# Patient Record
Sex: Female | Born: 1945 | Race: White | Hispanic: No | Marital: Married | Smoking: Former smoker
Health system: Southern US, Community
[De-identification: ages and names within clinical notes are randomized; demographics above are authoritative.]

## PROBLEM LIST (undated history)

## (undated) DIAGNOSIS — F419 Anxiety disorder, unspecified: Secondary | ICD-10-CM

## (undated) DIAGNOSIS — C8590 Non-Hodgkin lymphoma, unspecified, unspecified site: Secondary | ICD-10-CM

## (undated) DIAGNOSIS — M8580 Other specified disorders of bone density and structure, unspecified site: Secondary | ICD-10-CM

## (undated) DIAGNOSIS — G4730 Sleep apnea, unspecified: Secondary | ICD-10-CM

## (undated) DIAGNOSIS — I10 Essential (primary) hypertension: Secondary | ICD-10-CM

## (undated) DIAGNOSIS — R911 Solitary pulmonary nodule: Secondary | ICD-10-CM

## (undated) DIAGNOSIS — E785 Hyperlipidemia, unspecified: Secondary | ICD-10-CM

## (undated) DIAGNOSIS — K219 Gastro-esophageal reflux disease without esophagitis: Secondary | ICD-10-CM

## (undated) DIAGNOSIS — N6019 Diffuse cystic mastopathy of unspecified breast: Secondary | ICD-10-CM

## (undated) DIAGNOSIS — H409 Unspecified glaucoma: Secondary | ICD-10-CM

## (undated) DIAGNOSIS — R51 Headache: Secondary | ICD-10-CM

## (undated) DIAGNOSIS — K5730 Diverticulosis of large intestine without perforation or abscess without bleeding: Secondary | ICD-10-CM

## (undated) DIAGNOSIS — Z9289 Personal history of other medical treatment: Secondary | ICD-10-CM

## (undated) DIAGNOSIS — K635 Polyp of colon: Secondary | ICD-10-CM

## (undated) DIAGNOSIS — I059 Rheumatic mitral valve disease, unspecified: Secondary | ICD-10-CM

## (undated) DIAGNOSIS — I8390 Asymptomatic varicose veins of unspecified lower extremity: Secondary | ICD-10-CM

## (undated) DIAGNOSIS — J309 Allergic rhinitis, unspecified: Secondary | ICD-10-CM

## (undated) DIAGNOSIS — I480 Paroxysmal atrial fibrillation: Secondary | ICD-10-CM

## (undated) DIAGNOSIS — G4733 Obstructive sleep apnea (adult) (pediatric): Secondary | ICD-10-CM

## (undated) HISTORY — DX: Asymptomatic varicose veins of unspecified lower extremity: I83.90

## (undated) HISTORY — DX: Anxiety disorder, unspecified: F41.9

## (undated) HISTORY — DX: Hyperlipidemia, unspecified: E78.5

## (undated) HISTORY — DX: Polyp of colon: K63.5

## (undated) HISTORY — DX: Allergic rhinitis, unspecified: J30.9

## (undated) HISTORY — DX: Non-Hodgkin lymphoma, unspecified, unspecified site: C85.90

## (undated) HISTORY — DX: Unspecified glaucoma: H40.9

## (undated) HISTORY — DX: Diverticulosis of large intestine without perforation or abscess without bleeding: K57.30

## (undated) HISTORY — DX: Paroxysmal atrial fibrillation: I48.0

## (undated) HISTORY — DX: Rheumatic mitral valve disease, unspecified: I05.9

## (undated) HISTORY — DX: Diffuse cystic mastopathy of unspecified breast: N60.19

## (undated) HISTORY — DX: Solitary pulmonary nodule: R91.1

## (undated) HISTORY — DX: Personal history of other medical treatment: Z92.89

## (undated) HISTORY — DX: Headache: R51

## (undated) HISTORY — DX: Other specified disorders of bone density and structure, unspecified site: M85.80

## (undated) HISTORY — PX: OTHER SURGICAL HISTORY: SHX169

## (undated) HISTORY — DX: Gastro-esophageal reflux disease without esophagitis: K21.9

## (undated) HISTORY — PX: BASAL CELL CARCINOMA EXCISION: SHX1214

## (undated) HISTORY — DX: Obstructive sleep apnea (adult) (pediatric): G47.33

## (undated) HISTORY — DX: Sleep apnea, unspecified: G47.30

## (undated) HISTORY — DX: Essential (primary) hypertension: I10

---

## 1994-08-27 HISTORY — PX: ABDOMINAL HYSTERECTOMY: SHX81

## 1997-11-15 ENCOUNTER — Ambulatory Visit (HOSPITAL_COMMUNITY): Admission: RE | Admit: 1997-11-15 | Discharge: 1997-11-15 | Payer: Self-pay | Admitting: Surgery

## 1998-05-30 ENCOUNTER — Ambulatory Visit (HOSPITAL_COMMUNITY): Admission: RE | Admit: 1998-05-30 | Discharge: 1998-05-30 | Payer: Self-pay | Admitting: Surgery

## 1999-06-22 ENCOUNTER — Ambulatory Visit (HOSPITAL_COMMUNITY): Admission: RE | Admit: 1999-06-22 | Discharge: 1999-06-22 | Payer: Self-pay | Admitting: Radiology

## 2000-01-18 ENCOUNTER — Encounter: Admission: RE | Admit: 2000-01-18 | Discharge: 2000-01-18 | Payer: Self-pay | Admitting: *Deleted

## 2000-07-02 ENCOUNTER — Encounter: Payer: Self-pay | Admitting: Surgery

## 2000-07-02 ENCOUNTER — Encounter: Admission: RE | Admit: 2000-07-02 | Discharge: 2000-07-02 | Payer: Self-pay | Admitting: Surgery

## 2000-09-03 ENCOUNTER — Other Ambulatory Visit: Admission: RE | Admit: 2000-09-03 | Discharge: 2000-09-03 | Payer: Self-pay | Admitting: *Deleted

## 2000-11-28 ENCOUNTER — Ambulatory Visit (HOSPITAL_COMMUNITY): Admission: RE | Admit: 2000-11-28 | Discharge: 2000-11-28 | Payer: Self-pay | Admitting: Gastroenterology

## 2000-11-28 ENCOUNTER — Encounter: Payer: Self-pay | Admitting: Gastroenterology

## 2000-12-18 ENCOUNTER — Encounter: Admission: RE | Admit: 2000-12-18 | Discharge: 2001-03-18 | Payer: Self-pay | Admitting: Pulmonary Disease

## 2001-01-09 ENCOUNTER — Encounter: Admission: RE | Admit: 2001-01-09 | Discharge: 2001-01-09 | Payer: Self-pay | Admitting: Surgery

## 2001-01-09 ENCOUNTER — Encounter: Payer: Self-pay | Admitting: Surgery

## 2001-01-29 ENCOUNTER — Ambulatory Visit (HOSPITAL_COMMUNITY): Admission: RE | Admit: 2001-01-29 | Discharge: 2001-01-29 | Payer: Self-pay | Admitting: Gastroenterology

## 2001-01-29 ENCOUNTER — Encounter: Payer: Self-pay | Admitting: Gastroenterology

## 2001-07-10 ENCOUNTER — Encounter: Admission: RE | Admit: 2001-07-10 | Discharge: 2001-07-10 | Payer: Self-pay | Admitting: Surgery

## 2001-07-10 ENCOUNTER — Encounter: Payer: Self-pay | Admitting: Surgery

## 2002-01-14 ENCOUNTER — Encounter: Admission: RE | Admit: 2002-01-14 | Discharge: 2002-01-14 | Payer: Self-pay | Admitting: Family Medicine

## 2002-02-11 ENCOUNTER — Encounter: Payer: Self-pay | Admitting: Surgery

## 2002-02-11 ENCOUNTER — Encounter: Admission: RE | Admit: 2002-02-11 | Discharge: 2002-02-11 | Payer: Self-pay | Admitting: Surgery

## 2002-06-22 ENCOUNTER — Ambulatory Visit (HOSPITAL_COMMUNITY): Admission: RE | Admit: 2002-06-22 | Discharge: 2002-06-22 | Payer: Self-pay | Admitting: Pulmonary Disease

## 2002-06-22 ENCOUNTER — Encounter: Payer: Self-pay | Admitting: Pulmonary Disease

## 2002-08-19 ENCOUNTER — Encounter: Admission: RE | Admit: 2002-08-19 | Discharge: 2002-08-19 | Payer: Self-pay | Admitting: *Deleted

## 2002-09-24 ENCOUNTER — Other Ambulatory Visit: Admission: RE | Admit: 2002-09-24 | Discharge: 2002-09-24 | Payer: Self-pay | Admitting: *Deleted

## 2002-09-25 ENCOUNTER — Encounter: Admission: RE | Admit: 2002-09-25 | Discharge: 2002-09-25 | Payer: Self-pay | Admitting: Thoracic Surgery

## 2002-09-25 ENCOUNTER — Encounter: Payer: Self-pay | Admitting: Thoracic Surgery

## 2003-01-22 ENCOUNTER — Encounter: Admission: RE | Admit: 2003-01-22 | Discharge: 2003-01-22 | Payer: Self-pay | Admitting: Thoracic Surgery

## 2003-01-22 ENCOUNTER — Encounter: Payer: Self-pay | Admitting: Thoracic Surgery

## 2003-02-16 ENCOUNTER — Encounter: Admission: RE | Admit: 2003-02-16 | Discharge: 2003-02-16 | Payer: Self-pay | Admitting: Surgery

## 2003-02-16 ENCOUNTER — Encounter: Payer: Self-pay | Admitting: Surgery

## 2003-07-27 ENCOUNTER — Encounter: Admission: RE | Admit: 2003-07-27 | Discharge: 2003-07-27 | Payer: Self-pay | Admitting: Thoracic Surgery

## 2003-09-08 ENCOUNTER — Encounter: Admission: RE | Admit: 2003-09-08 | Discharge: 2003-09-08 | Payer: Self-pay | Admitting: *Deleted

## 2003-09-28 ENCOUNTER — Other Ambulatory Visit: Admission: RE | Admit: 2003-09-28 | Discharge: 2003-09-28 | Payer: Self-pay | Admitting: *Deleted

## 2004-01-18 ENCOUNTER — Encounter: Admission: RE | Admit: 2004-01-18 | Discharge: 2004-01-18 | Payer: Self-pay | Admitting: Thoracic Surgery

## 2004-02-24 ENCOUNTER — Encounter: Admission: RE | Admit: 2004-02-24 | Discharge: 2004-02-24 | Payer: Self-pay | Admitting: Surgery

## 2004-07-26 ENCOUNTER — Encounter: Admission: RE | Admit: 2004-07-26 | Discharge: 2004-07-26 | Payer: Self-pay | Admitting: Thoracic Surgery

## 2004-09-12 ENCOUNTER — Encounter: Admission: RE | Admit: 2004-09-12 | Discharge: 2004-09-12 | Payer: Self-pay | Admitting: Surgery

## 2005-03-20 ENCOUNTER — Encounter: Admission: RE | Admit: 2005-03-20 | Discharge: 2005-03-20 | Payer: Self-pay | Admitting: *Deleted

## 2005-04-02 ENCOUNTER — Ambulatory Visit: Payer: Self-pay | Admitting: Pulmonary Disease

## 2005-04-24 ENCOUNTER — Encounter: Admission: RE | Admit: 2005-04-24 | Discharge: 2005-04-24 | Payer: Self-pay | Admitting: Surgery

## 2005-06-05 ENCOUNTER — Ambulatory Visit (HOSPITAL_COMMUNITY): Admission: RE | Admit: 2005-06-05 | Discharge: 2005-06-05 | Payer: Self-pay | Admitting: Pulmonary Disease

## 2005-07-05 ENCOUNTER — Ambulatory Visit: Payer: Self-pay | Admitting: Pulmonary Disease

## 2005-07-17 ENCOUNTER — Ambulatory Visit: Payer: Self-pay | Admitting: Pulmonary Disease

## 2005-09-18 ENCOUNTER — Ambulatory Visit: Payer: Self-pay | Admitting: Pulmonary Disease

## 2005-10-29 ENCOUNTER — Encounter: Admission: RE | Admit: 2005-10-29 | Discharge: 2005-10-29 | Payer: Self-pay | Admitting: Specialist

## 2005-12-25 ENCOUNTER — Ambulatory Visit: Payer: Self-pay | Admitting: Pulmonary Disease

## 2006-04-11 ENCOUNTER — Encounter: Admission: RE | Admit: 2006-04-11 | Discharge: 2006-04-11 | Payer: Self-pay | Admitting: Surgery

## 2006-07-09 ENCOUNTER — Ambulatory Visit: Payer: Self-pay | Admitting: Pulmonary Disease

## 2007-02-05 ENCOUNTER — Ambulatory Visit: Payer: Self-pay | Admitting: Pulmonary Disease

## 2007-02-25 HISTORY — PX: OTHER SURGICAL HISTORY: SHX169

## 2007-03-18 ENCOUNTER — Ambulatory Visit: Payer: Self-pay | Admitting: Pulmonary Disease

## 2007-03-27 ENCOUNTER — Ambulatory Visit (HOSPITAL_BASED_OUTPATIENT_CLINIC_OR_DEPARTMENT_OTHER): Admission: RE | Admit: 2007-03-27 | Discharge: 2007-03-27 | Payer: Self-pay | Admitting: Orthopedic Surgery

## 2007-04-14 ENCOUNTER — Encounter: Admission: RE | Admit: 2007-04-14 | Discharge: 2007-04-14 | Payer: Self-pay | Admitting: Surgery

## 2007-06-03 ENCOUNTER — Encounter: Payer: Self-pay | Admitting: Pulmonary Disease

## 2007-07-31 DIAGNOSIS — K649 Unspecified hemorrhoids: Secondary | ICD-10-CM | POA: Insufficient documentation

## 2007-07-31 DIAGNOSIS — I059 Rheumatic mitral valve disease, unspecified: Secondary | ICD-10-CM | POA: Insufficient documentation

## 2007-07-31 DIAGNOSIS — R519 Headache, unspecified: Secondary | ICD-10-CM | POA: Insufficient documentation

## 2007-07-31 DIAGNOSIS — I10 Essential (primary) hypertension: Secondary | ICD-10-CM | POA: Insufficient documentation

## 2007-07-31 DIAGNOSIS — R51 Headache: Secondary | ICD-10-CM | POA: Insufficient documentation

## 2007-07-31 DIAGNOSIS — K219 Gastro-esophageal reflux disease without esophagitis: Secondary | ICD-10-CM | POA: Insufficient documentation

## 2007-07-31 DIAGNOSIS — E7800 Pure hypercholesterolemia, unspecified: Secondary | ICD-10-CM | POA: Insufficient documentation

## 2007-08-01 ENCOUNTER — Ambulatory Visit: Payer: Self-pay | Admitting: Pulmonary Disease

## 2007-08-01 LAB — CONVERTED CEMR LAB
ALT: 24 units/L (ref 0–35)
AST: 20 units/L (ref 0–37)
Albumin: 4.3 g/dL (ref 3.5–5.2)
Alkaline Phosphatase: 80 units/L (ref 39–117)
BUN: 11 mg/dL (ref 6–23)
Bacteria, UA: NEGATIVE
Basophils Absolute: 0 10*3/uL (ref 0.0–0.1)
Basophils Relative: 0.8 % (ref 0.0–1.0)
Bilirubin Urine: NEGATIVE
Bilirubin, Direct: 0.1 mg/dL (ref 0.0–0.3)
CO2: 33 meq/L — ABNORMAL HIGH (ref 19–32)
Calcium: 10.2 mg/dL (ref 8.4–10.5)
Chloride: 99 meq/L (ref 96–112)
Cholesterol: 164 mg/dL (ref 0–200)
Creatinine, Ser: 0.7 mg/dL (ref 0.4–1.2)
Crystals: NEGATIVE
Eosinophils Absolute: 0.1 10*3/uL (ref 0.0–0.6)
Eosinophils Relative: 1.2 % (ref 0.0–5.0)
GFR calc Af Amer: 109 mL/min
GFR calc non Af Amer: 90 mL/min
Glucose, Bld: 105 mg/dL — ABNORMAL HIGH (ref 70–99)
HCT: 39.2 % (ref 36.0–46.0)
HDL: 40.1 mg/dL (ref >39.0)
Hemoglobin, Urine: NEGATIVE
Hemoglobin: 13.9 g/dL (ref 12.0–15.0)
Ketones, ur: NEGATIVE mg/dL
LDL Cholesterol: 110 mg/dL — ABNORMAL HIGH (ref 0–99)
Lymphocytes Relative: 26.9 % (ref 12.0–46.0)
MCHC: 35.4 g/dL (ref 30.0–36.0)
MCV: 92.6 fL (ref 78.0–100.0)
Monocytes Absolute: 0.4 10*3/uL (ref 0.2–0.7)
Monocytes Relative: 8.4 % (ref 3.0–11.0)
Mucus, UA: NEGATIVE
Neutro Abs: 3.3 10*3/uL (ref 1.4–7.7)
Neutrophils Relative %: 62.7 % (ref 43.0–77.0)
Nitrite: NEGATIVE
Platelets: 264 10*3/uL (ref 150–400)
Potassium: 3.6 meq/L (ref 3.5–5.1)
RBC / HPF: NONE SEEN
RBC: 4.24 M/uL (ref 3.87–5.11)
RDW: 11.9 % (ref 11.5–14.6)
Sodium: 140 meq/L (ref 135–145)
Specific Gravity, Urine: 1.025 (ref 1.000–1.03)
Squamous Epithelial / LPF: NEGATIVE /lpf
TSH: 1.66 microintl units/mL (ref 0.35–5.50)
Total Bilirubin: 1 mg/dL (ref 0.3–1.2)
Total CHOL/HDL Ratio: 4.1
Total Protein, Urine: NEGATIVE mg/dL
Total Protein: 7.4 g/dL (ref 6.0–8.3)
Triglycerides: 70 mg/dL (ref 0–149)
Urine Glucose: NEGATIVE mg/dL
Urobilinogen, UA: 0.2 (ref 0.0–1.0)
VLDL: 14 mg/dL (ref 0–40)
WBC: 5.2 10*3/uL (ref 4.5–10.5)
pH: 6 (ref 5.0–8.0)

## 2007-08-12 ENCOUNTER — Ambulatory Visit: Payer: Self-pay | Admitting: Pulmonary Disease

## 2007-08-12 LAB — CONVERTED CEMR LAB
Fecal Occult Blood: NEGATIVE
OCCULT 1: NEGATIVE
OCCULT 2: NEGATIVE
OCCULT 3: NEGATIVE
OCCULT 4: NEGATIVE
OCCULT 5: NEGATIVE

## 2007-08-18 ENCOUNTER — Telehealth: Payer: Self-pay | Admitting: Pulmonary Disease

## 2007-08-26 ENCOUNTER — Telehealth: Payer: Self-pay | Admitting: Adult Health

## 2007-08-28 HISTORY — PX: EYE SURGERY: SHX253

## 2007-09-30 ENCOUNTER — Encounter: Payer: Self-pay | Admitting: Pulmonary Disease

## 2007-12-17 ENCOUNTER — Ambulatory Visit: Payer: Self-pay | Admitting: Pulmonary Disease

## 2007-12-17 DIAGNOSIS — J309 Allergic rhinitis, unspecified: Secondary | ICD-10-CM | POA: Insufficient documentation

## 2007-12-17 DIAGNOSIS — R079 Chest pain, unspecified: Secondary | ICD-10-CM | POA: Insufficient documentation

## 2007-12-17 DIAGNOSIS — J984 Other disorders of lung: Secondary | ICD-10-CM | POA: Insufficient documentation

## 2007-12-17 DIAGNOSIS — N6019 Diffuse cystic mastopathy of unspecified breast: Secondary | ICD-10-CM | POA: Insufficient documentation

## 2007-12-17 DIAGNOSIS — M899 Disorder of bone, unspecified: Secondary | ICD-10-CM | POA: Insufficient documentation

## 2007-12-17 DIAGNOSIS — M810 Age-related osteoporosis without current pathological fracture: Secondary | ICD-10-CM | POA: Insufficient documentation

## 2007-12-17 DIAGNOSIS — L659 Nonscarring hair loss, unspecified: Secondary | ICD-10-CM | POA: Insufficient documentation

## 2007-12-17 DIAGNOSIS — K5730 Diverticulosis of large intestine without perforation or abscess without bleeding: Secondary | ICD-10-CM | POA: Insufficient documentation

## 2007-12-22 ENCOUNTER — Ambulatory Visit: Payer: Self-pay | Admitting: Cardiology

## 2008-02-04 ENCOUNTER — Encounter: Payer: Self-pay | Admitting: Pulmonary Disease

## 2008-03-28 ENCOUNTER — Encounter: Payer: Self-pay | Admitting: Pulmonary Disease

## 2008-04-27 ENCOUNTER — Encounter: Admission: RE | Admit: 2008-04-27 | Discharge: 2008-04-27 | Payer: Self-pay | Admitting: Surgery

## 2008-05-27 ENCOUNTER — Encounter: Payer: Self-pay | Admitting: Pulmonary Disease

## 2008-07-26 ENCOUNTER — Encounter: Payer: Self-pay | Admitting: Pulmonary Disease

## 2008-08-04 ENCOUNTER — Ambulatory Visit: Payer: Self-pay | Admitting: Pulmonary Disease

## 2008-08-04 DIAGNOSIS — H409 Unspecified glaucoma: Secondary | ICD-10-CM | POA: Insufficient documentation

## 2008-08-04 LAB — CONVERTED CEMR LAB
ALT: 19 units/L (ref 0–35)
AST: 20 units/L (ref 0–37)
Albumin: 4.6 g/dL (ref 3.5–5.2)
Alkaline Phosphatase: 89 units/L (ref 39–117)
BUN: 10 mg/dL (ref 6–23)
Basophils Absolute: 0.1 10*3/uL (ref 0.0–0.1)
Basophils Relative: 0.8 % (ref 0.0–3.0)
Bilirubin Urine: NEGATIVE
Bilirubin, Direct: 0.1 mg/dL (ref 0.0–0.3)
CO2: 33 meq/L — ABNORMAL HIGH (ref 19–32)
Calcium: 9.8 mg/dL (ref 8.4–10.5)
Chloride: 103 meq/L (ref 96–112)
Cholesterol: 170 mg/dL (ref 0–200)
Creatinine, Ser: 0.6 mg/dL (ref 0.4–1.2)
Crystals: NEGATIVE
Eosinophils Absolute: 0.1 10*3/uL (ref 0.0–0.7)
Eosinophils Relative: 1.1 % (ref 0.0–5.0)
GFR calc Af Amer: 130 mL/min
GFR calc non Af Amer: 107 mL/min
Glucose, Bld: 113 mg/dL — ABNORMAL HIGH (ref 70–99)
HCT: 41.7 % (ref 36.0–46.0)
HDL: 37.4 mg/dL — ABNORMAL LOW (ref >39.0)
Hemoglobin: 14.8 g/dL (ref 12.0–15.0)
Ketones, ur: NEGATIVE mg/dL
LDL Cholesterol: 108 mg/dL — ABNORMAL HIGH (ref 0–99)
Leukocytes, UA: NEGATIVE
Lymphocytes Relative: 13.1 % (ref 12.0–46.0)
MCHC: 35.6 g/dL (ref 30.0–36.0)
MCV: 92.3 fL (ref 78.0–100.0)
Monocytes Absolute: 0.5 10*3/uL (ref 0.1–1.0)
Monocytes Relative: 5 % (ref 3.0–12.0)
Mucus, UA: NEGATIVE
Neutro Abs: 7.9 10*3/uL — ABNORMAL HIGH (ref 1.4–7.7)
Neutrophils Relative %: 80 % — ABNORMAL HIGH (ref 43.0–77.0)
Nitrite: NEGATIVE
Platelets: 256 10*3/uL (ref 150–400)
Potassium: 3.6 meq/L (ref 3.5–5.1)
RBC: 4.51 M/uL (ref 3.87–5.11)
RDW: 11.7 % (ref 11.5–14.6)
Sodium: 142 meq/L (ref 135–145)
Specific Gravity, Urine: <=1.005 (ref 1.000–1.03)
TSH: 1.57 microintl units/mL (ref 0.35–5.50)
Total Bilirubin: 1 mg/dL (ref 0.3–1.2)
Total CHOL/HDL Ratio: 4.5
Total Protein, Urine: NEGATIVE mg/dL
Total Protein: 7.9 g/dL (ref 6.0–8.3)
Triglycerides: 124 mg/dL (ref 0–149)
Urine Glucose: NEGATIVE mg/dL
Urobilinogen, UA: 0.2 (ref 0.0–1.0)
VLDL: 25 mg/dL (ref 0–40)
WBC: 9.9 10*3/uL (ref 4.5–10.5)
pH: 6 (ref 5.0–8.0)

## 2008-08-05 ENCOUNTER — Telehealth (INDEPENDENT_AMBULATORY_CARE_PROVIDER_SITE_OTHER): Payer: Self-pay | Admitting: *Deleted

## 2008-08-09 ENCOUNTER — Telehealth (INDEPENDENT_AMBULATORY_CARE_PROVIDER_SITE_OTHER): Payer: Self-pay | Admitting: *Deleted

## 2008-08-11 ENCOUNTER — Ambulatory Visit: Payer: Self-pay | Admitting: Thoracic Surgery

## 2008-08-18 ENCOUNTER — Ambulatory Visit: Payer: Self-pay | Admitting: Thoracic Surgery

## 2008-08-18 ENCOUNTER — Encounter: Admission: RE | Admit: 2008-08-18 | Discharge: 2008-08-18 | Payer: Self-pay | Admitting: Thoracic Surgery

## 2008-08-19 ENCOUNTER — Ambulatory Visit: Payer: Self-pay | Admitting: Pulmonary Disease

## 2008-08-19 LAB — CONVERTED CEMR LAB
Fecal Occult Blood: NEGATIVE
OCCULT 1: NEGATIVE
OCCULT 2: NEGATIVE
OCCULT 3: NEGATIVE
OCCULT 4: NEGATIVE
OCCULT 5: NEGATIVE

## 2009-04-28 ENCOUNTER — Encounter: Admission: RE | Admit: 2009-04-28 | Discharge: 2009-04-28 | Payer: Self-pay | Admitting: Surgery

## 2009-05-05 ENCOUNTER — Encounter: Admission: RE | Admit: 2009-05-05 | Discharge: 2009-05-05 | Payer: Self-pay | Admitting: Surgery

## 2009-05-26 ENCOUNTER — Encounter: Payer: Self-pay | Admitting: Pulmonary Disease

## 2009-06-29 ENCOUNTER — Telehealth: Payer: Self-pay | Admitting: Pulmonary Disease

## 2009-07-07 ENCOUNTER — Encounter: Payer: Self-pay | Admitting: Pulmonary Disease

## 2009-08-09 ENCOUNTER — Ambulatory Visit: Payer: Self-pay | Admitting: Pulmonary Disease

## 2009-08-09 LAB — CONVERTED CEMR LAB: Vit D, 25-Hydroxy: 44 ng/mL (ref 30–89)

## 2009-08-11 LAB — CONVERTED CEMR LAB
ALT: 22 units/L (ref 0–35)
AST: 22 units/L (ref 0–37)
Albumin: 4.3 g/dL (ref 3.5–5.2)
Alkaline Phosphatase: 77 units/L (ref 39–117)
BUN: 11 mg/dL (ref 6–23)
Basophils Absolute: 0 10*3/uL (ref 0.0–0.1)
Basophils Relative: 0.7 % (ref 0.0–3.0)
Bilirubin Urine: NEGATIVE
Bilirubin, Direct: 0.1 mg/dL (ref 0.0–0.3)
CO2: 33 meq/L — ABNORMAL HIGH (ref 19–32)
Calcium: 9.8 mg/dL (ref 8.4–10.5)
Chloride: 100 meq/L (ref 96–112)
Cholesterol: 156 mg/dL (ref 0–200)
Creatinine, Ser: 0.7 mg/dL (ref 0.4–1.2)
Eosinophils Absolute: 0.1 10*3/uL (ref 0.0–0.7)
Eosinophils Relative: 1.3 % (ref 0.0–5.0)
GFR calc non Af Amer: 89.32 mL/min (ref >60)
Glucose, Bld: 93 mg/dL (ref 70–99)
HCT: 41.1 % (ref 36.0–46.0)
HDL: 42.3 mg/dL (ref >39.00)
Hemoglobin, Urine: NEGATIVE
Hemoglobin: 14.2 g/dL (ref 12.0–15.0)
Hgb A1c MFr Bld: 5.7 % (ref 4.6–6.5)
Ketones, ur: NEGATIVE mg/dL
LDL Cholesterol: 100 mg/dL — ABNORMAL HIGH (ref 0–99)
Leukocytes, UA: NEGATIVE
Lymphocytes Relative: 26.9 % (ref 12.0–46.0)
Lymphs Abs: 1.3 10*3/uL (ref 0.7–4.0)
MCHC: 34.7 g/dL (ref 30.0–36.0)
MCV: 95.6 fL (ref 78.0–100.0)
Monocytes Absolute: 0.4 10*3/uL (ref 0.1–1.0)
Monocytes Relative: 7.8 % (ref 3.0–12.0)
Neutro Abs: 3.2 10*3/uL (ref 1.4–7.7)
Neutrophils Relative %: 63.3 % (ref 43.0–77.0)
Nitrite: NEGATIVE
Platelets: 223 10*3/uL (ref 150.0–400.0)
Potassium: 3.7 meq/L (ref 3.5–5.1)
RBC: 4.29 M/uL (ref 3.87–5.11)
RDW: 11.6 % (ref 11.5–14.6)
Sodium: 140 meq/L (ref 135–145)
Specific Gravity, Urine: 1.01 (ref 1.000–1.030)
TSH: 2.1 microintl units/mL (ref 0.35–5.50)
Total Bilirubin: 1 mg/dL (ref 0.3–1.2)
Total CHOL/HDL Ratio: 4
Total Protein, Urine: NEGATIVE mg/dL
Total Protein: 7.4 g/dL (ref 6.0–8.3)
Triglycerides: 71 mg/dL (ref 0.0–149.0)
Urine Glucose: NEGATIVE mg/dL
Urobilinogen, UA: 0.2 (ref 0.0–1.0)
VLDL: 14.2 mg/dL (ref 0.0–40.0)
WBC: 5 10*3/uL (ref 4.5–10.5)
pH: 6 (ref 5.0–8.0)

## 2009-09-01 ENCOUNTER — Telehealth: Payer: Self-pay | Admitting: Pulmonary Disease

## 2009-09-23 ENCOUNTER — Encounter: Payer: Self-pay | Admitting: Pulmonary Disease

## 2010-03-14 ENCOUNTER — Encounter: Payer: Self-pay | Admitting: Pulmonary Disease

## 2010-04-28 ENCOUNTER — Encounter: Payer: Self-pay | Admitting: Pulmonary Disease

## 2010-05-08 ENCOUNTER — Encounter: Admission: RE | Admit: 2010-05-08 | Discharge: 2010-05-08 | Payer: Self-pay | Admitting: Surgery

## 2010-05-09 ENCOUNTER — Encounter: Payer: Self-pay | Admitting: Pulmonary Disease

## 2010-07-24 ENCOUNTER — Encounter: Admission: RE | Admit: 2010-07-24 | Discharge: 2010-07-24 | Payer: Self-pay | Admitting: Family Medicine

## 2010-08-07 ENCOUNTER — Encounter: Payer: Self-pay | Admitting: Pulmonary Disease

## 2010-08-11 ENCOUNTER — Ambulatory Visit: Payer: Self-pay | Admitting: Pulmonary Disease

## 2010-08-22 ENCOUNTER — Telehealth: Payer: Self-pay | Admitting: Pulmonary Disease

## 2010-08-24 ENCOUNTER — Telehealth: Payer: Self-pay | Admitting: Pulmonary Disease

## 2010-08-25 ENCOUNTER — Ambulatory Visit: Payer: Self-pay | Admitting: Pulmonary Disease

## 2010-08-27 HISTORY — PX: OTHER SURGICAL HISTORY: SHX169

## 2010-09-06 LAB — HM COLONOSCOPY

## 2010-09-08 ENCOUNTER — Telehealth (INDEPENDENT_AMBULATORY_CARE_PROVIDER_SITE_OTHER): Payer: Self-pay | Admitting: *Deleted

## 2010-09-12 ENCOUNTER — Other Ambulatory Visit: Payer: Self-pay | Admitting: Pulmonary Disease

## 2010-09-12 ENCOUNTER — Ambulatory Visit
Admission: RE | Admit: 2010-09-12 | Discharge: 2010-09-12 | Payer: Self-pay | Source: Home / Self Care | Attending: Pulmonary Disease | Admitting: Pulmonary Disease

## 2010-09-12 LAB — HEMOCCULT SLIDES (X 3 CARDS)
Fecal Occult Blood: NEGATIVE
OCCULT 1: POSITIVE
OCCULT 2: NEGATIVE
OCCULT 3: NEGATIVE
OCCULT 4: NEGATIVE
OCCULT 5: NEGATIVE

## 2010-09-14 ENCOUNTER — Telehealth: Payer: Self-pay | Admitting: Pulmonary Disease

## 2010-09-18 ENCOUNTER — Telehealth: Payer: Self-pay | Admitting: Pulmonary Disease

## 2010-09-19 ENCOUNTER — Encounter: Payer: Self-pay | Admitting: Pulmonary Disease

## 2010-09-26 NOTE — Letter (Signed)
Summary: [facility]  [facility]   Imported By: Sherian Rein 10/07/2009 13:32:24  _____________________________________________________________________  External Attachment:    Type:   Image     Comment:   External Document

## 2010-09-26 NOTE — Consult Note (Signed)
Summary: [place] By: Jerrye Noble D'jimraou 04/07/2008 13:50:20  _____________________________________________________________________  External Attachment:    Type:   Image     Comment:   External Document

## 2010-09-26 NOTE — Procedures (Signed)
Summary: [facility] Endoscopy/[facility]  [facility]oscopy/[facility]   Imported By: Sherian Rein 07/25/2009 08:33:16  _____________________________________________________________________  External Attachment:    Type:   Image     Comment:   External Document

## 2010-09-26 NOTE — Progress Notes (Signed)
Summary: rx  Phone Note Call from Patient Call back at [phone]   Caller: Patient Call For: nadel Reason for Call: Talk to Nurse Summary of Call: coughing up yellow plegm, can you call in zpac to [facility] Initial call taken by: Eugene Gavia,  August 05, 2008 1:15 PM  Follow-up for Phone Call        OK PER SN TO SEND Z-PAK #1 AS DIRECTED Follow-up by: Philipp Deputy CMA,  August 05, 2008 1:26 PM    New/Updated Medications: ZITHROMAX Z-PAK 250 MG TABS (AZITHROMYCIN) 2 TABS THE FIRST DAY THEN 1 TAB DAILY TILL GONE   Prescriptions: ZITHROMAX Z-PAK 250 MG TABS (AZITHROMYCIN) 2 TABS THE FIRST DAY THEN 1 TAB DAILY TILL GONE  #1 x 0   Entered by:   Philipp Deputy CMA   Authorized by:   Michele Mcalpine MD   Signed by:   Philipp Deputy CMA on 08/05/2008   Method used:   Faxed to ...       [facility]* (retail)       [address]       [place], [place]  [id number]       Ph: [phone]       Fax: [phone]   RxID:   [id number]

## 2010-09-26 NOTE — Letter (Signed)
Summary: [facility]   Imported By: Sherian Rein 06/20/2009 10:17:28  _____________________________________________________________________  External Attachment:    Type:   Image     Comment:   External Document

## 2010-09-26 NOTE — Letter (Signed)
Summary: [facility]  [facility]   Imported By: Lester Washtucna 05/05/2010 09:56:35  _____________________________________________________________________  External Attachment:    Type:   Image     Comment:   External Document

## 2010-09-26 NOTE — Letter (Signed)
Summary: [facility]   Imported By: Lennie Odor 05/24/2010 14:24:28  _____________________________________________________________________  External Attachment:    Type:   Image     Comment:   External Document

## 2010-09-26 NOTE — Consult Note (Signed)
Summary: [facility] By: Edmonia James 02/16/2008 14:53:51  _____________________________________________________________________  External Attachment:    Type:   Image     Comment:   External Document

## 2010-09-26 NOTE — Letter (Signed)
Summary: [facility]   Imported By: Sherian Rein 07/25/2009 10:08:50  _____________________________________________________________________  External Attachment:    Type:   Image     Comment:   External Document

## 2010-09-26 NOTE — Procedures (Signed)
Summary: EGD/[facility]  EGD/[facility]   Imported By: Bubba Hales 07/12/2009 09:09:23  _____________________________________________________________________  External Attachment:    Type:   Image     Comment:   External Document

## 2010-09-26 NOTE — Procedures (Signed)
Summary: Colon/[facility]  Colon/[facility]   Imported By: Bubba Hales 07/12/2009 09:10:41  _____________________________________________________________________  External Attachment:    Type:   Image     Comment:   External Document

## 2010-09-26 NOTE — Procedures (Signed)
Summary: Colonoscopy/[facility]  Colonoscopy/[facility]   Imported By: Sherian Rein 07/25/2009 08:34:49  _____________________________________________________________________  External Attachment:    Type:   Image     Comment:   External Document

## 2010-09-26 NOTE — Assessment & Plan Note (Signed)
Summary: cpx/ mbw   Chief Complaint:  Yearly CPX....  History of Present Illness: 65 y/o WF here for a follow up visit and CPX... she has multiple medical problems as noted below...   1)  she had f/u [facility]- DrStreck 10/09 for breast exam w/ +FamHx of breast ca in mother... Mammogram neg, exam neg & f/u planned 37yr. 2)  had f/u [facility] GI dept, DrMorgan in 7/09- hx GERD, divertics w/ stricture, sl constipation- doing well & no changes made. 3)  6/09 saw DrSypher for bilat shoulder pain & DDD in [facility]- he rec exercise therapy. 4)  saw DrWall for Cards 4/09 w/ non-cardiac CP, hx MVP, HBP, Hyperlipid- she had neg Myoview in 2005... he rec same meds.   ~  August 04, 2008:  she saw DrShoemaker last week (note pending) for chr cough and "tickle" in throat... she reports nothing found and she has since developed a sore throat, sl hoarseness, continued cough; but denies f/c/s & no phlegm...  she recalls seeing DrBurney in 2005 for "cough and nodules on my lungs" and she would like a referral to see him again to be sure that everything is OK... she would also like Tussionex for the cough & we will Rx w/ MMW for the throat symptoms.     Current Problems:   GLAUCOMA (ICD-365.9) - on COSOPT drops Bid per Ophthalomology for "ocular hypertension, glaucoma, & drusen"...  ALLERGIC RHINITIS (ICD-477.9) - on CLARITIN 10mg  Prn...  Hx of PULMONARY NODULE (ICD-518.89) - prev CTChest w/ several 4-28mm bilat nodules & no change x yrs... first scan 2003 for chr cough/  followed by DrBurney x yrs/  last CT 11/05 (no change)...  ~  yearly CXR's have been stable since last CT Chest in 11/05.  HYPERTENSION (ICD-401.9) - on MICARDIS 80-12.5 daily, & NORVASC 10mg /d...  BP = 122/80, takes meds regularly & tolerates well... denies HA, fatigue, visual changes, palipit, dizziness, syncope, dyspnea, edema, etc...   CHEST PAIN, ACUTE (ICD-786.50) & MITRAL VALVE PROLAPSE (ICD-424.0) - takes ASA 81mg /d... she has seen DrWall in  2005, and again 4/09 for atypical CP & MVP... he recommended increased exercise program for her...   ~  2DEcho 7/03 showed mild prolapse of the ant MV leaflet...  ~  NuclearStressTest 2/05 was negative- no ischemia, no infarction, EF= 81%...  HYPERCHOLESTEROLEMIA (ICD-272.0) - on LIPITOR 20mg /d,  and FISH OIL 1000mg /d...  ~  FLP 12/08 on Lip20 showed TChol 164, TG 70, HDL 40, LDL 110...  ~  FLP 12/09 =   GERD (ICD-530.81) - on NEXIUM 40mg /d...  ~   last EGD 11/05 by [facility] showed sm HH, min gastritis (no esophagitis and no Barrett's)...  DIVERTICULOSIS OF COLON (ICD-562.10) & Hx of HEMORRHOIDS (ICD-455.6) - last colonoscopy 4/05 by Sunday Corn showed divertics, fixed tortuous segm in sigmoid...   FIBROCYSTIC BREAST DISEASE (ICD-610.1) - followed by DrStreck due to +FamHx of breast cancer in mother...  OSTEOPENIA (ICD-733.90) - on Calcium, Vitamins, Vit D... BMD's at [facility] have been WNL...  HEADACHE (ICD-784.0) - hx headache eval 1996 by Wende Bushy al...  ~  she had a neg eval by DrWillis in 1999 for numbness in fingers and toes...  ALOPECIA (ICD-704.00) - she had an evaluation from Dr. Aurea Graff at [facility] in [place] in 2002... he recommended Black Currant and Evening Primrose Oil  for her hair health, along w/ Horse Chestnut for varicose veins and a DASH diet... she also did yoga, accupuncture, massage therapy, and "  body talk" therapy... Dermatologists at [facility] recommended Rogaine therapy...  Health Maintenance -  GYN= DrDickstein & pt states up-to-date... she saw DrKohut in 2008 for thyroid panerl which was WNL...      Current Allergies (reviewed today): No known allergies   Past Medical History:        GLAUCOMA (ICD-365.9)    ALLERGIC RHINITIS (ICD-477.9)    Hx of PULMONARY NODULE (ICD-518.89)    HYPERTENSION (ICD-401.9)    CHEST PAIN, ACUTE (ICD-786.50)    MITRAL VALVE PROLAPSE (ICD-424.0)    HYPERCHOLESTEROLEMIA (ICD-272.0)     GERD (ICD-530.81)    DIVERTICULOSIS OF COLON (ICD-562.10)    HEMORRHOIDS (ICD-455.6)    FIBROCYSTIC BREAST DISEASE (ICD-610.1)    OSTEOPENIA (ICD-733.90)    HEADACHE (ICD-784.0)    ALOPECIA (ICD-704.00)      Past Surgical History:    S/P TAH & BSO    S/P ORIF prox phalanx of right little finger 7/08 by DrKuzma    Risk Factors:  Tobacco use:  never   Review of Systems       The patient complains of sore throat, hoarseness, chest pain, cough, constipation, gas/bloating, and anxiety.  The patient denies fever, chills, sweats, anorexia, fatigue, weakness, malaise, weight loss, sleep disorder, blurring, diplopia, eye irritation, eye discharge, vision loss, eye pain, photophobia, earache, ear discharge, tinnitus, decreased hearing, nasal congestion, nosebleeds, palpitations, syncope, dyspnea on exertion, orthopnea, PND, peripheral edema, dyspnea at rest, excessive sputum, hemoptysis, wheezing, pleurisy, nausea, vomiting, diarrhea, change in bowel habits, abdominal pain, melena, hematochezia, jaundice, indigestion/heartburn, dysphagia, odynophagia, dysuria, hematuria, urinary frequency, urinary hesitancy, nocturia, incontinence, back pain, joint pain, joint swelling, muscle cramps, muscle weakness, stiffness, arthritis, sciatica, restless legs, leg pain at night, leg pain with exertion, rash, itching, dryness, suspicious lesions, paralysis, paresthesias, seizures, tremors, vertigo, transient blindness, frequent falls, frequent headaches, difficulty walking, depression, memory loss, confusion, cold intolerance, heat intolerance, polydipsia, polyphagia, polyuria, unusual weight change, abnormal bruising, bleeding, enlarged lymph nodes, urticaria, allergic rash, hay fever, and recurrent infections.     Vital Signs:  Patient Profile:   65 Years Old Female Weight:      170.25 pounds O2 Sat:      96 % O2 treatment:    Room Air Temp:     98.9 degrees F oral Pulse rate:   90 / minute BP sitting:    122 / 80  (right arm) Cuff size:   regular  Vitals Entered By: Marijo File CMA (August 04, 2008 9:05 AM)                 Physical Exam  WD, WN, 65 y/o WF in NAD... GENERAL:  Alert & oriented; pleasant & cooperative... HEENT:  [place]/AT, EOM-wnl, PERRLA, EACs-clear, TMs-wnl, NOSE-clear, THROAT-clear & wnl. NECK:  Supple w/ full ROM; no JVD; normal carotid impulses w/o bruits; no thyromegaly or nodules palpated; no lymphadenopathy. CHEST:  Clear to P & A; without wheezes/ rales/ or rhonchi. HEART:  Regular Rhythm; without murmurs/ rubs/ or gallops. ABDOMEN:  Soft & nontender; normal bowel sounds; no organomegaly or masses detected. EXT: without deformities, mild arthritic changes; no varicose veins/ +venous insuffic/ no edema. NEURO:  CN's intact; motor testing normal; sensory testing normal; gait normal & balance OK. DERM:  No lesions noted; no rash, mild alopecia...        Impression & Recommendations:  Problem # 1:  PHYSICAL EXAMINATION (ICD-V70.0) Good general health... we will f/u CXR/ EKG/ Labs and refer to DrBurney per request...  Orders: 12 Lead  EKG (12 Lead EKG) T-2 View CXR, Same Day (71020.5TC) Venipuncture (32440) TLB-Lipid Panel (80061-LIPID) TLB-BMP (Basic Metabolic Panel-BMET) (80048-METABOL) TLB-CBC Platelet - w/Differential (85025-CBCD) TLB-Hepatic/Liver Function Pnl (80076-HEPATIC) TLB-TSH (Thyroid Stimulating Hormone) (84443-TSH) TLB-Udip w/ Micro (81001-URINE)   Problem # 2:  Hx of PULMONARY NODULE (ICD-518.89) Hx benign, likely old inflamm pulm nodule- last seen on CT 11/05 - tiny  ~51mm pleural based in right post mid chest... subseq CXR's without developing nodule seen... pt is reassured. Orders: T-2 View CXR, Same Day (71020.5TC) Surgical Referral (Surgery)   Problem # 3:  HYPERTENSION (ICD-401.9) Controlled-  same meds. Her updated medication list for this problem includes:    Norvasc 10 Mg Tabs (Amlodipine besylate) .[name]... Take 1 tablet  by mouth once a day    Micardis Hct 80-12.5 Mg Tabs (Telmisartan-hctz) .[name]... Take 1 tablet by mouth once a day   Problem # 4:  MITRAL VALVE PROLAPSE (ICD-424.0) Stable- w/ min CP, no signif palpit etc... Her updated medication list for this problem includes:    Adult Aspirin Ec Low Strength 81 Mg Tbec (Aspirin) .[name]... Take 1 tablet by mouth once a day   Problem # 5:  HYPERCHOLESTEROLEMIA (ICD-272.0) Due for f/u FLP... continue med. Her updated medication list for this problem includes:    Lipitor 20 Mg Tabs (Atorvastatin calcium) .[name]... Take 1 tablet by mouth once a day   Problem # 6:  DIVERTICULOSIS OF COLON (ICD-562.10) GI problems stable and [facility]...  Problem # 7:  FIBROCYSTIC BREAST DISEASE (ICD-610.1) Stable w/ neg mammogram and followed by DrStreck yearly...  Complete Medication List: 1)  Cosopt 2-0.5 % Soln (Dorzolamide-timolol) .... Use two times a day in both eyes 2)  Claritin 10 Mg Tabs (Loratadine) .... As needed 3)  Adult Aspirin Ec Low Strength 81 Mg Tbec (Aspirin) .... Take 1 tablet by mouth once a day 4)  Norvasc 10 Mg Tabs (Amlodipine besylate) .... Take 1 tablet by mouth once a day 5)  Micardis Hct 80-12.5 Mg Tabs (Telmisartan-hctz) .... Take 1 tablet by mouth once a day 6)  Lipitor 20 Mg Tabs (Atorvastatin calcium) .... Take 1 tablet by mouth once a day 7)  Fish Oil 1000 Mg Caps (Omega-3 fatty acids) .... Take 1 tablet by mouth once a day 8)  Nexium 40 Mg Cpdr (Esomeprazole magnesium) .... Take 1 tablet by mouth once a day (30 min prior to evening meal) 9)  Citracal Plus Tabs (Multiple minerals-vitamins) .... Take 1 tablet by mouth once a day 10)  Centrum Tabs (Multiple vitamins-minerals) .... Take 1 tablet by mouth once a day 11)  Vitamin D 1000 Unit Tabs (Cholecalciferol) .... Take 1 tablet by mouth once a day 12)  Tussionex Pennkinetic Er 8-10 Mg/56ml Lqcr (Chlorpheniramine-hydrocodone) .... Take 1 tsp by mouth two times a day as needed for  cough... 13)  Magic Mouthwash  .... 1 tsp gargle and swallow 4 times per day as needed for throat symptoms...   Patient Instructions: 1)  Today we updated your med list- see below.... 2)  We refilled your meds per request... 3)  We also wrote new perscriptions for Tussionex and MMW for your cough and throat symptoms.Marland KitchenMarland Kitchen 4)  We did your follow up CXR and blood work today- please call the "phone tree" in a few days for your lab results.Marland KitchenMarland Kitchen  5)  Please check the "stool cards" at your convenience and mail them back to [place]... 6)  We will arrange for a follow up appt w/ DrBurney  as you requested... (we will send him a copy of this note and CXR report)... 7)  Call for any problems... 8)  Please schedule a follow-up appointment in 1 year, sooner as needed.   Prescriptions: MAGIC MOUTHWASH 1 tsp gargle and swallow 4 times per day as needed for throat symptoms...  #4 oz x prn   Entered and Authorized by:   Michele Mcalpine MD   Signed by:   Michele Mcalpine MD on 08/04/2008   Method used:   Print then Give to Patient   RxID:   [id number] TUSSIONEX PENNKINETIC ER 8-10 MG/5ML LQCR (CHLORPHENIRAMINE-HYDROCODONE) take 1 tsp by mouth two times a day as needed for cough...  #4 oz x 6   Entered and Authorized by:   Michele Mcalpine MD   Signed by:   Michele Mcalpine MD on 08/04/2008   Method used:   Print then Give to Patient   RxID:   [id number] NEXIUM 40 MG CPDR (ESOMEPRAZOLE MAGNESIUM) Take 1 tablet by mouth once a day (30 min prior to evening meal)  #30 x prn   Entered and Authorized by:   Michele Mcalpine MD   Signed by:   Michele Mcalpine MD on 08/04/2008   Method used:   Print then Give to Patient   RxID:   [id number] LIPITOR 20 MG  TABS (ATORVASTATIN CALCIUM) Take 1 tablet by mouth once a day  #30 x prn   Entered and Authorized by:   Michele Mcalpine MD   Signed by:   Michele Mcalpine MD on 08/04/2008   Method used:   Print then Give to Patient   RxID:   [id number] MICARDIS HCT 80-12.5 MG   TABS (TELMISARTAN-HCTZ) Take 1 tablet by mouth once a day  #30 x prn   Entered and Authorized by:   Michele Mcalpine MD   Signed by:   Michele Mcalpine MD on 08/04/2008   Method used:   Print then Give to Patient   RxID:   [id number] NORVASC 10 MG  TABS (AMLODIPINE BESYLATE) Take 1 tablet by mouth once a day  #30 x prn   Entered and Authorized by:   Michele Mcalpine MD   Signed by:   Michele Mcalpine MD on 08/04/2008   Method used:   Print then Give to Patient   RxID:   [id number]  ]

## 2010-09-26 NOTE — Consult Note (Signed)
Summary: [facility]   Imported By: Jerrye Noble D'jimraou 06/16/2008 15:18:48  _____________________________________________________________________  External Attachment:    Type:   Image     Comment:   External Document

## 2010-09-26 NOTE — Consult Note (Signed)
[facility]   Imported By: Esmeralda Links D'jimraou 10/28/2007 13:34:45  _____________________________________________________________________  External Attachment:    Type:   Image     Comment:   External Document

## 2010-09-26 NOTE — Progress Notes (Signed)
Summary: rx  Phone Note Call from Patient   Caller: Patient Call For: nadel Reason for Call: Talk to Nurse Summary of Call: runny nose, drainage, wants allegra if possible. [facility] Initial call taken by: Eugene Gavia,  August 09, 2008 8:52 AM  Follow-up for Phone Call        per sn ok to call in allegra 180 1 once daily with as needed refills Follow-up by: Philipp Deputy CMA,  August 09, 2008 10:48 AM    New/Updated Medications: ALLEGRA 180 MG TABS (FEXOFENADINE HCL) 1 by mouth once daily   Prescriptions: ALLEGRA 180 MG TABS (FEXOFENADINE HCL) 1 by mouth once daily  #30 x 11   Entered by:   Philipp Deputy CMA   Authorized by:   Michele Mcalpine MD   Signed by:   Philipp Deputy CMA on 08/09/2008   Method used:   Faxed to ...       [facility]* (retail)       [address]       [place], [place]  [id number]       Ph: [phone]       Fax: [phone]   RxID:   [id number]

## 2010-09-26 NOTE — Consult Note (Signed)
Summary: [facility]  [facility]   Imported By: Esmeralda Links D'jimraou 11/03/2007 13:43:17  _____________________________________________________________________  External Attachment:    Type:   Image     Comment:   External Document

## 2010-09-26 NOTE — Progress Notes (Signed)
Summary: 2nd req meds / cough/sore throat  Medications Added ZITHROMAX Z-PAK 250 MG  TABS (AZITHROMYCIN) take as directed TUSSIONEX PENNKINETIC ER 8-10 MG/5ML  LQCR (CHLORPHENIRAMINE-HYDROCODONE) take 1 tsp every 12 hours as needed cough       Phone Note Call from Patient Call back at [phone]   Caller: Patient Call For: Kayti Poss Reason for Call: Acute Illness Summary of Call: sore throat, coughYellow plegm.  Can she get z-pac and tussionex? [facility] chart ordered Initial call taken by: Zigmund Gottron,  August 18, 2007 8:49 AM  Follow-up for Phone Call        lmom tcb Follow-up by: Valrie Hart LPN,  August 17, 6769 2:37 PM  Additional Follow-up for Phone Call Additional follow up Details #1::        sore throat, cough w/ yellow sputum. head congestion.  symptoms started 3 days ago. no tightness in chest.  no shortness of breath.  would like z-pack and tussionex called in to [facility]. no appts avail. today or tomorrow. Additional Follow-up by: Valrie Hart LPN,  August 17, 3402 2:47 PM    Additional Follow-up for Phone Call Additional follow up Details #2::    PT HAS NOT RECEIVED MEDS THAT SHE [place]. ALSO WANTS HER LAB RESULTS. [facility] [phone]. PT# [id number].  PATIENT'S CHART HAS BEEN REQUESTED. Follow-up by: Cooper Render,  August 19, 2007 12:38 PM  Additional Follow-up for Phone Call Additional follow up Details #3:: Details for Additional Follow-up Action Taken: per Dr. Bertram Millard can have avelox and tussionex.  called into the pharmacy---[facility]  Additional Follow-up by: Ramiro Harvest CMA,  August 19, 2007 2:32 PM  New/Updated Medications: ZITHROMAX Z-PAK 250 MG  TABS (AZITHROMYCIN) take as directed TUSSIONEX PENNKINETIC ER 8-10 MG/5ML  LQCR (CHLORPHENIRAMINE-HYDROCODONE) take 1 tsp every 12 hours as needed cough   Prescriptions: TUSSIONEX PENNKINETIC ER 8-10 MG/5ML  LQCR  (CHLORPHENIRAMINE-HYDROCODONE) take 1 tsp every 12 hours as needed cough  #4 oz x 0   Entered by:   Ramiro Harvest CMA   Authorized by:   Noralee Space MD   Signed by:   Ramiro Harvest CMA on 08/19/2007   Method used:   Telephoned to ...       [facility]       [place], [place]  [address]       Ph: [phone] or [phone]       Fax: [phone]   RxID:   [id number]

## 2010-09-26 NOTE — Assessment & Plan Note (Signed)
Summary: sharp pain in left side chest area/apc   Chief Complaint:  Add-on today for chest pain....  History of Present Illness: 65 y/o WF known to me w/ mult med problems (see below)... added on this AM for acute onset of CP... notes sharp left axillary area CP this AM, radiating across chest to right side, mod severity, lasting several hours and spont remitting (it's gone now, she says)... no dull SS CP, no radiation to the jaw or arm, no n/v/diaphoresis, etc...  She has hx HBP and MVP and has seen DrWall in the past... baseline EKG shows NSR & WNL.Marland KitchenMarland Kitchen prev 2DEcho 7/03 showed mild ant leaflet MVP, norm LV wall motion and EF...  prev NuclearStressTest 2/05 was neg without ischemia or infarct, EF=81%...   Current Problems:  CHEST PAIN, ACUTE (ICD-786.50) ALLERGIC RHINITIS (ICD-477.9) Hx of PULMONARY NODULE (ICD-518.89) - prev CTChest w/ several 4-78m bilat nodules & no change x yrs... first scan 2003 for chr cough/  followed by DrBurney x yrs/  last CT 11/05... HYPERTENSION (ICD-401.9) - on MICARDIS 80-12.5 daily, & NORVASC 149md... MITRAL VALVE PROLAPSE (ICD-424.0) - takes ASA 8143m... HYPERCHOLESTEROLEMIA (ICD-272.0) - on LIPITOR 1m64m.. GERD (ICD-530.81) - on NEXIUM 40mg87m.  ~   last EGD 11/05 by DrMorJunie Spencered sm [facility], min gastritis... DIVERTICULOSIS OF COLON (ICD-562.10) - last colonoscopy 4/05 by DrMorJunie Spencered divertics, fixed tortuous segm in sigmoid...  HEMORRHOIDS (ICD-455.6) FIBROCYSTIC BREAST DISEASE (ICD-610.1) - followed by DrStreck...  OSTEOPENIA (ICD-733.90) - BMD's at SER have been WNL... HEADACHE (ICD-784.0) ALOPECIA (ICD-704.00)      Current Allergies: No known allergies   Past Medical History:        CHEST PAIN, ACUTE (ICD-786.50)    ALLERGIC RHINITIS (ICD-477.9)    Hx of PULMONARY NODULE (ICD-518.89)    HYPERTENSION (ICD-401.9)    MITRAL VALVE PROLAPSE (ICD-424.0)    HYPERCHOLESTEROLEMIA (ICD-272.0)    GERD (ICD-530.81)  DIVERTICULOSIS OF COLON (ICD-562.10)    HEMORRHOIDS (ICD-455.6)    FIBROCYSTIC BREAST DISEASE (ICD-610.1)    OSTEOPENIA (ICD-733.90)    HEADACHE (ICD-784.0)    ALOPECIA (ICD-704.00)      Past Surgical History:    S/P TAH & BSO     Review of Systems       The patient complains of chest pain.  The patient denies anorexia, fever, weight loss, weight gain, vision loss, decreased hearing, hoarseness, syncope, peripheral edema, prolonged cough, hemoptysis, abdominal pain, melena, hematochezia, severe indigestion/heartburn, hematuria, incontinence, muscle weakness, suspicious skin lesions, transient blindness, difficulty walking, depression, unusual weight change, abnormal bleeding, enlarged lymph nodes, and angioedema.     Vital Signs:  Patient Profile:   65 Ye84s Old Female O2 Sat:      99 % O2 treatment:    Room Air Temp:     97.1 degrees F oral Pulse rate:   68 / minute BP sitting:   122 / 82  (left arm)  Vitals Entered By: LeighRamiro Harvest(December 17, 2007 9:21 AM)                 Physical Exam  WD, WN, 65 y/31WF in NAD... GENERAL:  Alert & oriented; pleasant & cooperative... HEENT:  [place]/AT, EOM-wnl, PERRLA, EACs-clear, TMs-wnl, NOSE-clear, THROAT-clear & wnl. NECK:  Supple w/ full ROM; no JVD; normal carotid impulses w/o bruits; no thyromegaly or nodules palpated; no lymphadenopathy. CHEST:  Clear to P & A; without wheezes/ rales/ or rhonchi;  +tender left axillary/ pectoralis area- reproduces her pain. HEART:  Regular Rhythm;  gr 1/6 SEM without rubs, gallops, or clicks heard... ABDOMEN:  Soft & nontender; normal bowel sounds; no organomegaly or masses detected.. EXT: without deformities or arthritic changes; no varicose veins/ venous insuffic/ or edema. NEURO:  CN's intact;  no focal neuro deficits... DERM:  No lesions noted; mild thinning/ alopecia; no rash...       Impression & Recommendations:  Problem # 1:  CHEST PAIN, ACUTE (ICD-786.50) Assessment:  New The CP appears atypical- chest wall pain w/ tenderness in the axillary area and pectoralis muscle that reproduces her discomfort... she cannot recall any injury or activity that might have brought this on... EKG here is NSR, WNL.[name]. the pain remitted spont... she is already taking an ASA daily... REC ~  rest, heat, DCN100 as needed use... she would like to f/u w/ DrWall for cardiology... Orders: Cardiology Referral (Cardiology)   Problem # 2:  HYPERTENSION (ICD-401.9) Assessment: Unchanged Controlled... same meds. Her updated medication list for this problem includes:    Norvasc 10 Mg Tabs (Amlodipine besylate) .[name]... Take 1 tablet by mouth once a day    Micardis Hct 80-12.5 Mg Tabs (Telmisartan-hctz) .[name]... Take 1 tablet by mouth once a day   Problem # 3:  MITRAL VALVE PROLAPSE (ICD-424.0) Assessment: Unchanged Aware... doubt this pain related... do not want to add BBlocker... continue same meds. Her updated medication list for this problem includes:    Adult Aspirin Ec Low Strength 81 Mg Tbec (Aspirin) .[name]... Take 1 tablet by mouth once a day  Problem # 4:  HYPERCHOLESTEROLEMIA (ICD-272.0) Assessment: Unchanged She is due for f/u FASTING labs soon... Her updated medication list for this problem includes:    Lipitor 20 Mg Tabs (Atorvastatin calcium) .[name]... Take 1 tablet by mouth once a day   Problem # 5:  Other Problems as noted... Assessment: Unchanged  Complete Medication List: 1)  Cosopt 2-0.5 % Soln (Dorzolamide-timolol) .... Use two times a day in both eyes 2)  Claritin 10 Mg Tabs (Loratadine) .... As needed 3)  Adult Aspirin Ec Low Strength 81 Mg Tbec (Aspirin) .... Take 1 tablet by mouth once a day 4)  Norvasc 10 Mg Tabs (Amlodipine besylate) .... Take 1 tablet by mouth once a day 5)  Micardis Hct 80-12.5 Mg Tabs (Telmisartan-hctz) .... Take 1 tablet by mouth once a day 6)  Lipitor 20 Mg Tabs (Atorvastatin calcium) .... Take 1 tablet by mouth once a day 7)  Nexium 40 Mg  Cpdr (Esomeprazole magnesium) 8)  Centrum Tabs (Multiple vitamins-minerals) .... Take 1 tablet by mouth once a day 9)  Darvocet-n 100 100-650 Mg Tabs (Propoxyphene n-apap) .... Take 1 tab by mouth q 6 h as needed for pain...   Patient Instructions: 1)  Today we evaluated your acute chest pain and it appears to be noncardiac related (chest wall pain from inflammation).Marland KitchenMarland Kitchen 2)  Try rest/ heat/ myoflex cream/ etc..[name] 3)  You may use Advil/ tylenol/ or the Rx for Darvocet as needed... 4)  We will arrange for a folow up w/ DrWall to be sure everything is stable from the cardiac standpoint.Marland KitchenMarland Kitchen 5)  Call for any problems...    Prescriptions: DARVOCET-N 100 100-650 MG  TABS (PROPOXYPHENE N-APAP) take 1 tab by mouth Q 6 H as needed for pain...  #50 x 5   Entered and Authorized by:   Noralee Space MD   Signed by:   Noralee Space MD on 12/17/2007   Method used:   Print then Give to Patient   RxID:  [id number]  ] 

## 2010-09-26 NOTE — Miscellaneous (Signed)
Summary: [facility]  [facility]   Imported By: Bubba Hales 03/29/2010 10:14:50  _____________________________________________________________________  External Attachment:    Type:   Image     Comment:   External Document

## 2010-09-26 NOTE — Progress Notes (Signed)
Summary: rx req/ cough/ congestion  Phone Note Call from Patient   Caller: Patient Call For: Wacey Zieger Summary of Call: pt c/o cough w/ yellow mucus x 2 days. requests z pac. "maybe low-grade fever". has physical scheduled for next month. please call in to [facility]. pt cell [phone] Initial call taken by: Tivis Ringer,  June 29, 2009 11:23 AM  Follow-up for Phone Call        Please advise.  Zackery Barefoot CMA  June 29, 2009 11:36 AM     per SN---ok for pt to have zpak  #1  take as directed with no refills called and left message for pt on machine to make her aware of med sent to pharmacy Marijo File CMA  June 29, 2009 2:25 PM      New/Updated Medications: ZITHROMAX Z-PAK 250 MG TABS (AZITHROMYCIN) take as directed Prescriptions: ZITHROMAX Z-PAK 250 MG TABS (AZITHROMYCIN) take as directed  #1 pak x 0   Entered by:   Marijo File CMA   Authorized by:   Michele Mcalpine MD   Signed by:   Marijo File CMA on 06/29/2009   Method used:   Electronically to        [facility]* (retail)       [address]       [place], [place]  [id number]       Ph: [phone]       Fax: [phone]   RxID:   [id number]

## 2010-09-26 NOTE — Progress Notes (Signed)
Summary: NEED LAB RESULTS   Phone Note Call from Patient Call back at [phone]   Caller: Patient Call For: NADEL Summary of Call: CALLING FOR LAB RESULTS PATIENT'S CHART HAS BEEN REQUESTED  Initial call taken by: Rickard Patience,  August 26, 2007 10:06 AM  Follow-up for Phone Call        Pt requesting lab results ,results are in EMR  Additional Follow-up for Phone Call Additional follow up Details #1::        call pt labs are normal, continue same rx Additional Follow-up by: Rubye Oaks NP,  August 27, 2007 9:23 AM    Additional Follow-up for Phone Call Additional follow up Details #2::    Pt aware labs are normal. Follow-up by: Jerolyn Shin,  August 27, 2007 9:37 AM       Appended Document: NEED LAB RESULTS please call labs are normal

## 2010-09-26 NOTE — Assessment & Plan Note (Signed)
Summary: 1 yr physical///kp   CC:  Yearly ROV & CPX....  History of Present Illness: 65 y/o WF here for a follow up visit and CPX... she has multiple medical problems as noted below...    1)  she had f/u [facility]- DrStreck 10/09 for breast exam w/ +FamHx of breast ca in mother... Mammogram neg, exam neg & f/u planned 79yr. 2)  had f/u [facility] GI dept, DrMorgan in 7/09- hx GERD, divertics w/ stricture, sl constipation- doing well & no changes made. 3)  6/09 saw DrSypher for bilat shoulder pain & DDD in [facility]- he rec exercise therapy. 4)  saw DrWall for Cards 4/09 w/ non-cardiac CP, hx MVP, HBP, Hyperlipid- she had neg Myoview in 2005... he rec same meds.   ~  August 04, 2008:  she saw DrShoemaker last week (note pending) for chr cough and "tickle" in throat... she reports nothing found and she has since developed a sore throat, sl hoarseness, continued cough; but denies f/c/s & no phlegm...  she recalls seeing DrBurney in 2005 for "cough and nodules on my lungs" and she would like a referral to see him again to be sure that everything is OK... she would also like Tussionex for the cough & we will Rx w/ MMW for the throat symptoms.   ~  August 09, 2009:  she's had a good year- f/u [facility] GI dept w/ EGD & Colon 11/10 done & basically OK... she fell  ~30mo ago & seen by [facility]- no fracure, just bruised & Rx Ibuprofen... BP is well controlled on meds; Chol doing satis on Lip20;  she would like a Vit D level checked today as well...     Current Problems:   GLAUCOMA (ICD-365.9) - on COSOPT drops Bid per Ophthalomology for "ocular hypertension, glaucoma, & drusen"...  ALLERGIC RHINITIS (ICD-477.9) - on CLARITIN 10mg  Prn...  Hx of PULMONARY NODULE (ICD-518.89) - prev CTChest w/ several 4-10mm bilat nodules & no change x yrs... first scan 2003 for chr cough/  followed by DrBurney x yrs/  last CT 11/05 (no change)...  ~  yearly CXR's have been stable since last CT Chest in 11/05.  ~  recheck  by DrBurney 12/09 w/ f/u CT Chest showing 2 subcentimeter nodules w/o change from old scans, no other signif abnormalities ident...  ~  CXR 12/10 is clear, NAD, no nodules seen...  HYPERTENSION (ICD-401.9) - on MICARDIS 80-12.5 daily, & NORVASC 10mg /d...  BP = 112/68, takes meds regularly & tolerates well... denies HA, fatigue, visual changes, palipit, dizziness, syncope, dyspnea, edema, etc...   CHEST PAIN, ACUTE (ICD-786.50) & MITRAL VALVE PROLAPSE (ICD-424.0) - takes ASA 81mg /d... she has seen DrWall in 2005, and again 4/09 for atypical CP & MVP... he recommended increased exercise program for her...   ~  2DEcho 7/03 showed mild prolapse of the ant MV leaflet...  ~  NuclearStressTest 2/05 was negative- no ischemia, no infarction, EF= 81%...  ~  Baseline EKG shows NSR, WNL, NAD...  HYPERCHOLESTEROLEMIA (ICD-272.0) - on LIPITOR 20mg /d,  and FISH OIL 1000mg /d...  ~  FLP 12/08 on Lip20 showed TChol 164, TG 70, HDL 40, LDL 110  ~  FLP 12/09 on Lip20 showed TChol 170, TG 124, HDL 37, LDL 108  ~  FLP 12/10 showed TChol 156, TG 71, HDL 42, LDL 100  GERD (ICD-530.81) - on NEXIUM 40mg /d...  ~   EGD 11/05 by [facility] showed sm HH, min gastritis (no esophagitis and no Barrett's)...  ~  repeat  EGD 11/10 by DrMorgan showed sm HH, mild gastritis... continue PPI.  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   ~  repeat colon 11/10 by DrMorgan showed mod divertics and narrowed sigm/ stricture, no polyps... rec> FIBER!  FIBROCYSTIC BREAST DISEASE (ICD-610.1) - followed by DrStreck due to +FamHx of breast cancer in mother...  OSTEOPENIA (ICD-733.90) - on Calcium, Vitamins, Vit D... BMD's at SER have been WNL.[name].  ~  labs 12/10 showed Vit D level = 44  HEADACHE (ICD-784.0) - hx headache eval 1996 by [facility] et al...  ~  she had a neg eval by DrWillis in 1999 for numbness in  fingers and toes...  ALOPECIA (ICD-704.00) - she had an evaluation from Dr. Aurea Graff at [facility] in [place] in 2002... he recommended Black Currant and Evening Primrose Oil  for her hair health, along w/ Horse Chestnut for varicose veins and a DASH diet... she also did yoga, accupuncture, massage therapy, and "body talk" therapy... Dermatologists at [facility] recommended Rogaine therapy...  Health Maintenance -  GYN= DrDickstein & pt states up-to-date... she saw DrKohut in 2008 for thyroid panerl which was WNL.Marland KitchenMarland Kitchen   Allergies (verified): No Known Drug Allergies  Comments:  Nurse/Medical Assistant: The patient's medications and allergies were reviewed with the patient and were updated in the Medication and Allergy Lists.  Past History:  Past Medical History:  GLAUCOMA (ICD-365.9) ALLERGIC RHINITIS (ICD-477.9) Hx of PULMONARY NODULE (ICD-518.89) HYPERTENSION (ICD-401.9) CHEST PAIN, ACUTE (ICD-786.50) MITRAL VALVE PROLAPSE (ICD-424.0) HYPERCHOLESTEROLEMIA (ICD-272.0) GERD (ICD-530.81) DIVERTICULOSIS OF COLON (ICD-562.10) HEMORRHOIDS (ICD-455.6) FIBROCYSTIC BREAST DISEASE (ICD-610.1) OSTEOPENIA (ICD-733.90) HEADACHE (ICD-784.0) ALOPECIA (ICD-704.00)  Past Surgical History: S/P TAH & BSO S/P ORIF prox phalanx of right little finger 7/08 by DrKuzma  Family History: Reviewed history and no changes required.  Social History: Reviewed history and no changes required.  Review of Systems      See HPI       The patient complains of dyspnea on exertion.  The patient denies anorexia, fever, weight loss, weight gain, vision loss, decreased hearing, hoarseness, chest pain, syncope, peripheral edema, prolonged cough, headaches, hemoptysis, abdominal pain, melena, hematochezia, severe indigestion/heartburn, hematuria, incontinence, muscle weakness, suspicious skin lesions, transient blindness, difficulty walking, depression, unusual weight change, abnormal  bleeding, enlarged lymph nodes, and angioedema.    Vital Signs:  Patient profile:   64 year old female Height:      64 inches Weight:      168.6 pounds BMI:     29.04 O2 Sat:      100 % on Room air Temp:     97.4 degrees F oral Pulse rate:   80 / minute BP sitting:   112 / 68  (right arm) Cuff size:   regular  Vitals Entered By: Randell Loop CMA (August 09, 2009 9:41 AM)  O2 Flow:  Room air  Physical Exam  Additional Exam:  WD, WN, 65 y/o WF in NAD... GENERAL:  Alert & oriented; pleasant & cooperative... HEENT:  [place]/AT, EOM-wnl, PERRLA, EACs-clear, TMs-wnl, NOSE-clear, THROAT-clear & wnl. NECK:  Supple w/ full ROM; no JVD; normal carotid impulses w/o bruits; no thyromegaly or nodules palpated; no lymphadenopathy. CHEST:  Clear to P & A; without wheezes/ rales/ or rhonchi. HEART:  Regular Rhythm; without murmurs/ rubs/ or gallops. ABDOMEN:  Soft & nontender; normal bowel sounds; no organomegaly or masses detected. EXT: without deformities, mild arthritic changes; no  varicose veins/ +venous insuffic/ no edema. NEURO:  CN's intact; motor testing normal; sensory testing normal; gait normal & balance OK. DERM:  No lesions noted; no rash, mild alopecia...    CXR  Procedure date:  08/09/2009  Findings:      CHEST - 2 VIEW   Comparison: PA and lateral chest 08/04/2008 and CT chest 08/18/2008.   Findings: Lungs are clear.  Heart size is normal.  No pleural effusion or focal bony abnormality.   IMPRESSION: No acute disease.   Read By:  Charyl Dancer,  M.D.      Comments:      I have personally reviewed this XRay on the Imagecast system- SN   EKG  Procedure date:  08/09/2009  Findings:      Normal sinus rhythm w/ rate= 70/min... Tracing is WNL, NAD...   MISC. Report  Procedure date:  08/09/2009  Findings:      Tests: (1) Lipid Panel (LIPID)   Cholesterol               156 mg/dL                   1-610    Triglycerides             71.0 mg/dL                   9.6-045.4   HDL                       09.81 mg/dL                 >19.14   VLDL Cholesterol          14.2 mg/dL                  7.8-29.5   LDL Cholesterol      [H]  621 mg/dL                   3-08  Tests: (2) BMP (METABOL)   Sodium                    140 mEq/L                   135-145   Potassium                 3.7 mEq/L                   3.5-5.1   Chloride                  100 mEq/L                   96-112   Carbon Dioxide       [H]  33 mEq/L                    19-32   Glucose                   93 mg/dL                    65-78   BUN                       11 mg/dL  6-23   Creatinine                0.7 mg/dL                   6.6-0.6   Calcium                   9.8 mg/dL                   3.0-16.0   GFR                       89.32 mL/min                >60  Tests: (3) CBC Platelet w/Diff (CBCD)   White Cell Count          5.0 K/uL                    4.5-10.5   Red Cell Count            4.29 Mil/uL                 3.87-5.11   Hemoglobin                14.2 g/dL                   10.9-32.3   Hematocrit                41.1 %                      36.0-46.0   MCV                       95.6 fl                     78.0-100.0   Platelet Count            223.0 K/uL                  150.0-400.0   Neutrophil %              63.3 %                      43.0-77.0   Lymphocyte %              26.9 %                      12.0-46.0   Monocyte %                7.8 %                       3.0-12.0   Eosinophils%              1.3 %                       0.0-5.0   Basophils %               0.7 %   Comments:      Tests: (4) Hepatic/Liver Function Panel (HEPATIC)   Total Bilirubin           1.0 mg/dL  0.3-1.2   Direct Bilirubin          0.1 mg/dL                   0.4-5.4   Alkaline Phosphatase      77 U/L                      39-117   AST                       22 U/L                      0-37   ALT                       22 U/L                      0-35    Total Protein             7.4 g/dL                    0.9-8.1   Albumin                   4.3 g/dL                    1.9-1.4  Tests: (5) TSH (TSH)   FastTSH                   2.10 uIU/mL                 0.35-5.50  Tests: (6) Hemoglobin A1C (A1C)   Hemoglobin A1C            5.7 %                       4.6-6.5  Tests: (7) UDip w/Micro (URINE)   Color                     LT. YELLOW   Clarity                   CLEAR                       Clear   Specific Gravity          1.010                       1.000 - 1.030   Urine Ph                  6.0                         5.0-8.0   Protein                   NEGATIVE                    Negative   Urine Glucose             NEGATIVE                    Negative   Ketones  NEGATIVE                    Negative   Urine Bilirubin           NEGATIVE                    Negative   Blood                     NEGATIVE                    Negative   Urobilinogen              0.2                         0.0 - 1.0   Leukocyte Esterace        NEGATIVE                    Negative   Nitrite                   NEGATIVE                    Negative   Urine RBC                 0-2/hpf                     0-2/hpf  Tests: (8) Vitamin D (25-Hydroxy) (95621)  Vitamin D (25-Hydroxy)                             44 ng/mL                    30-89   Impression & Recommendations:  Problem # 1:  PHYSICAL EXAMINATION (ICD-V70.0)  Orders: 12 Lead EKG (12 Lead EKG) T-2 View CXR (71020TC) Venipuncture (30865) TLB-Lipid Panel (80061-LIPID) TLB-BMP (Basic Metabolic Panel-BMET) (80048-METABOL) TLB-CBC Platelet - w/Differential (85025-CBCD) TLB-Hepatic/Liver Function Pnl (80076-HEPATIC) TLB-TSH (Thyroid Stimulating Hormone) (84443-TSH) TLB-A1C / Hgb A1C (Glycohemoglobin) (83036-A1C) TLB-Udip w/ Micro (81001-URINE) T-Vitamin D (25-Hydroxy) (78469-62952)  Problem # 2:  Hx of PULMONARY NODULE (ICD-518.89) CXR is clear... last CT chest 12/09 showed no change  x yrs and further CT's not rec...  Problem # 3:  HYPERTENSION (ICD-401.9) Controlled-  same med. Her updated medication list for this problem includes:    Norvasc 10 Mg Tabs (Amlodipine besylate) .[name]... Take 1 tablet by mouth once a day    Micardis Hct 80-12.5 Mg Tabs (Telmisartan-hctz) .[name]... Take 1 tablet by mouth once a day  Problem # 4:  HYPERCHOLESTEROLEMIA (ICD-272.0) Improved on the Lip20... rec better diet, same meds. Her updated medication list for this problem includes:    Lipitor 20 Mg Tabs (Atorvastatin calcium) .[name]... Take 1 tablet by mouth once a day  Problem # 5:  GERD (ICD-530.81) GI [facility]... Her updated medication list for this problem includes:    Nexium 40 Mg Cpdr (Esomeprazole magnesium) .[name]... Take 1 tablet by mouth once a day (30 min prior to evening meal)  Problem # 6:  OTHER MEDICAL PROBLEMS AS NOTED>>>  Complete Medication List: 1)  Cosopt 2-0.5 % Soln (Dorzolamide-timolol) .... Use two times a day in both eyes 2)  Claritin 10 Mg Tabs (Loratadine) .... As needed 3)  Adult Aspirin Ec  Low Strength 81 Mg Tbec (Aspirin) .... Take 1 tablet by mouth once a day 4)  Norvasc 10 Mg Tabs (Amlodipine besylate) .... Take 1 tablet by mouth once a day 5)  Micardis Hct 80-12.5 Mg Tabs (Telmisartan-hctz) .... Take 1 tablet by mouth once a day 6)  Lipitor 20 Mg Tabs (Atorvastatin calcium) .... Take 1 tablet by mouth once a day 7)  Fish Oil 1000 Mg Caps (Omega-3 fatty acids) .... Take 1 tablet by mouth once a day 8)  Nexium 40 Mg Cpdr (Esomeprazole magnesium) .... Take 1 tablet by mouth once a day (30 min prior to evening meal) 9)  Metamucil 30.9 % Powd (Psyllium) .... Take daily as directed... 10)  Citracal Plus Tabs (Multiple minerals-vitamins) .... Take 1 tablet by mouth once a day 11)  Centrum Tabs (Multiple vitamins-minerals) .... Take 1 tablet by mouth once a day 12)  Vitamin D 1000 Unit Tabs (Cholecalciferol) .... Take 1 tablet by mouth once a  day  Other Orders: Prescription Created Electronically [id number])  Patient Instructions: 1)  Today we updated your med list- see below.... 2)  We refilled your meds per request... 3)  Today we did your follow up CXR & fsting blood work... please call the "phone tree" in a few days for your lab results.Marland KitchenMarland Kitchen 4)  Let's get on track w/ our diet + exercise program... 5)  Call for any questions.Marland KitchenMarland Kitchen 6)  Please schedule a follow-up appointment in 1 year, sooner as needed... Prescriptions: NEXIUM 40 MG CPDR (ESOMEPRAZOLE MAGNESIUM) Take 1 tablet by mouth once a day (30 min prior to evening meal)  #30 x prn   Entered and Authorized by:   Michele Mcalpine MD   Signed by:   Michele Mcalpine MD on 08/09/2009   Method used:   Print then Give to Patient   RxID:   [id number] LIPITOR 20 MG  TABS (ATORVASTATIN CALCIUM) Take 1 tablet by mouth once a day  #30 x prn   Entered and Authorized by:   Michele Mcalpine MD   Signed by:   Michele Mcalpine MD on 08/09/2009   Method used:   Print then Give to Patient   RxID:   [id number] MICARDIS HCT 80-12.5 MG  TABS (TELMISARTAN-HCTZ) Take 1 tablet by mouth once a day  #30 x prn   Entered and Authorized by:   Michele Mcalpine MD   Signed by:   Michele Mcalpine MD on 08/09/2009   Method used:   Print then Give to Patient   RxID:   [id number] NORVASC 10 MG  TABS (AMLODIPINE BESYLATE) Take 1 tablet by mouth once a day  #30 x prn   Entered and Authorized by:   Michele Mcalpine MD   Signed by:   Michele Mcalpine MD on 08/09/2009   Method used:   Print then Give to Patient   RxID:   [id number]    CardioPerfect ECG  ID: [id number] Patient: Colleen White DOB: Dec 25, 1945 Age: 65 Years Old Sex: Female Race: White Physician: Berdie Malter,s Technician: Randell Loop CMA Height: 64 Weight: 168.6 Status: Unconfirmed Past Medical History:   GLAUCOMA (ICD-365.9) ALLERGIC RHINITIS (ICD-477.9) Hx of PULMONARY NODULE (ICD-518.89) HYPERTENSION (ICD-401.9) CHEST PAIN,  ACUTE (ICD-786.50) MITRAL VALVE PROLAPSE (ICD-424.0) HYPERCHOLESTEROLEMIA (ICD-272.0) GERD (ICD-530.81) DIVERTICULOSIS OF COLON (ICD-562.10) HEMORRHOIDS (ICD-455.6) FIBROCYSTIC BREAST DISEASE (ICD-610.1) OSTEOPENIA (ICD-733.90) HEADACHE (ICD-784.0) ALOPECIA (ICD-704.00)   Recorded: 08/09/2009 10:01 AM P/PR: 112 ms / 132 ms - Heart rate (maximum exercise) QRS: 81 QT/QTc/QTd:  406 ms / 422 ms / 35 ms - Heart rate (maximum exercise)  P/QRS/T axis: 25 deg / 37 deg / 20 deg - Heart rate (maximum exercise)  Heartrate: 69 bpm  Interpretation:  Normal sinus rhythm w/ rate= 70/min... Tracing is WNL, NAD.Marland KitchenMarland Kitchen

## 2010-09-26 NOTE — Progress Notes (Signed)
Summary: rx  Phone Note Call from Patient Call back at [phone]   Caller: Patient Call For: Allie Gerhold Reason for Call: Talk to Nurse Summary of Call: sore throat, cough, yellow plegm, no fever, feel lousy.  Can you call in zpac and tussionex. [facility] Initial call taken by: Eugene Gavia,  September 01, 2009 8:48 AM  Follow-up for Phone Call        please advise. Carron Curie CMA  September 01, 2009 9:02 AM     per SN---she can have zpak  #1  take as directed and tussionex #4oz  1 tsp by mouth every 12 hours as needed for cough.  thanks Randell Loop CMA  September 01, 2009 9:08 AM   rx sent pt aware. Carron Curie CMA  September 01, 2009 9:17 AM     New/Updated Medications: ZITHROMAX Z-PAK 250 MG TABS (AZITHROMYCIN) as directed TUSSIONEX PENNKINETIC ER 8-10 MG/5ML LQCR (CHLORPHENIRAMINE-HYDROCODONE) 1 teaspoon every 12 hours as needed cough Prescriptions: TUSSIONEX PENNKINETIC ER 8-10 MG/5ML LQCR (CHLORPHENIRAMINE-HYDROCODONE) 1 teaspoon every 12 hours as needed cough  #4 oz x 0   Entered by:   Carron Curie CMA   Authorized by:   Michele Mcalpine MD   Signed by:   Carron Curie CMA on 09/01/2009   Method used:   Telephoned to ...       [facility]* (retail)       [address]       [place], [place]  [address]       Ph: [phone]       Fax: [phone]   RxID:   [id number] Christena Deem Z-PAK 250 MG TABS (AZITHROMYCIN) as directed  #1 pak x 0   Entered by:   Carron Curie CMA   Authorized by:   Michele Mcalpine MD   Signed by:   Carron Curie CMA on 09/01/2009   Method used:   Electronically to        [facility]* (retail)       [address]       [place], [place]  [address]       Ph: [phone]       Fax: [phone]   RxID:   [id number]

## 2010-09-26 NOTE — Consult Note (Signed)
Summary: [facility]   Imported By: Edmonia James 08/06/2008 09:12:00  _____________________________________________________________________  External Attachment:    Type:   Image     Comment:   External Document

## 2010-09-27 DIAGNOSIS — E785 Hyperlipidemia, unspecified: Secondary | ICD-10-CM | POA: Insufficient documentation

## 2010-09-28 NOTE — Letter (Signed)
[facility]: [facility]   Imported By: Phillis Knack 08/17/2010 14:26:42  _____________________________________________________________________  External Attachment:    Type:   Image     Comment:   External Document

## 2010-09-28 NOTE — Progress Notes (Signed)
Summary: Refills requested---Nexium, Lipitor, Micardis HCT, Norvasc  Phone Note Refill Request Message from:  Fax from Pharmacy on August 22, 2010 4:55 PM  Refills Requested: Medication #1:  NORVASC 10 MG  TABS Take 1 tablet by mouth once a day   Dosage confirmed as above?Dosage Confirmed   Brand Name Necessary? No   Supply Requested: 1 month   Last Refilled: 07/19/2010  Medication #2:  NEXIUM 40 MG CPDR Take 1 tablet by mouth once a day (30 min prior to evening meal)   Dosage confirmed as above?Dosage Confirmed   Brand Name Necessary? No   Supply Requested: 1 month   Last Refilled: 07/25/2010  Medication #3:  MICARDIS HCT 80-12.5 MG  TABS Take 1 tablet by mouth once a day   Dosage confirmed as above?Dosage Confirmed   Brand Name Necessary? No   Supply Requested: 1 month   Last Refilled: 07/19/2010  Medication #4:  LIPITOR 20 MG  TABS Take 1 tablet by mouth once a day   Dosage confirmed as above?Dosage Confirmed   Brand Name Necessary? No   Supply Requested: 1 month   Last Refilled: 07/28/2010 Refills requested from [facility], [phone]. Last Ov w/ Dr. [name] 08/09/2009.   Method Requested: Electronic Next Appointment Scheduled: 10/20/2010 Initial call taken by: Francesca Jewett CMA,  August 22, 2010 4:59 PM  Follow-up for Phone Call        Okay for refills up to appt in 09/2010? Pls advise.Francesca Jewett [facility]  August 22, 2010 4:59 PM    Prescriptions: LIPITOR 20 MG  TABS (ATORVASTATIN CALCIUM) Take 1 tablet by mouth once a day  #30 x 3   Entered by:   Elita Boone CMA   Authorized by:   Noralee Space MD   Signed by:   Elita Boone CMA on 08/22/2010   Method used:   Electronically to        [facility] (retail)       [address], [place]  [address]       Ph: [phone]       Fax: [phone]   RxID:   [id number] MICARDIS HCT 80-12.5 MG  TABS (TELMISARTAN-HCTZ) Take 1 tablet by mouth once a day  #30 x 3   Entered by:    Elita Boone CMA   Authorized by:   Noralee Space MD   Signed by:   Elita Boone CMA on 08/22/2010   Method used:   Electronically to        [facility] (retail)       [address] [place], [place]  [address]       Ph: [phone]       Fax: [phone]   RxID:   [id number] NEXIUM 40 MG CPDR (ESOMEPRAZOLE MAGNESIUM) Take 1 tablet by mouth once a day (30 min prior to evening meal)  #30 x 3   Entered by:   Elita Boone CMA   Authorized by:   Noralee Space MD   Signed by:   Elita Boone CMA on 08/22/2010   Method used:   Electronically to        [facility] (retail)       [place], [place]  [address]       Ph: [phone]       Fax: [phone]   RxID:  [id number] NORVASC 10 MG  TABS (AMLODIPINE BESYLATE) Take 1 tablet by mouth once a day  #30 x 3   Entered by:   Elita Boone CMA   Authorized by:   Noralee Space MD   Signed by:   Elita Boone CMA on 08/22/2010   Method used:   Electronically to        [facility] (retail)       [place], [place]  [address]       Ph: [phone]       Fax: [phone]   RxID:   [id number]

## 2010-09-28 NOTE — Progress Notes (Signed)
Summary: new med  Phone Note Other Incoming   Summary of Call: pt stopped by the office this morning and requested rx for the alprazolam 0.5mg     for anxiety----pt wanted to be called at [phone] to be notified when this has been done.   Initial call taken by: Randell Loop CMA,  September 18, 2010 4:00 PM  Follow-up for Phone Call        per SN--- ok for pt to have rx for the alprazolam 0.5mg    1/2 to 1 tab by mouth three times a day as needed for nerves with 1 refill---this has been called to [facility] for the pt and the pt is aware Randell Loop CMA  September 18, 2010 4:01 PM     New/Updated Medications: ALPRAZOLAM 0.5 MG TABS (ALPRAZOLAM) take 1/2 to 1 tab by mouth three times a day as needed for nerves Prescriptions: ALPRAZOLAM 0.5 MG TABS (ALPRAZOLAM) take 1/2 to 1 tab by mouth three times a day as needed for nerves  #90 x 1   Entered by:   Randell Loop CMA   Authorized by:   Michele Mcalpine MD   Signed by:   Randell Loop CMA on 09/18/2010   Method used:   Telephoned to ...       [facility]* (retail)       [address]       [place], [place]  [address]       Ph: [phone]       Fax: [phone]   RxID:   [id number]

## 2010-09-28 NOTE — Progress Notes (Signed)
Summary: stool sample request  Phone Note Call from Patient   Caller: Patient Call For: NADEL Summary of Call: pt wants a card to check her stool. she says some parts of her stool look darker than other parts and she wants this checked. she would like to pick card up today. asks that nurse call her back asap. (pt has a cholenoscopy scheduled in 2 wks) call cell [phone] Initial call taken by: Tivis Ringer, CNA,  September 08, 2010 8:58 AM  Follow-up for Phone Call        Spoke with the pt and she states she has noticed her stool over the last few days has been darker then usual and she is requesting to come by and get some stool cards. She has a colonoscopy set for 09-19-10 and wants to be able to tell them is she had a positive stool card. Please advise.Carron Curie CMA  September 08, 2010 9:26 AM   Additional Follow-up for Phone Call Additional follow up Details #1::        per SN---ok for pt to have the stool cards---she will need to try to get the darker part on the card to test.  thanks Randell Loop [facility]  September 08, 2010 10:47 AM     Additional Follow-up for Phone Call Additional follow up Details #2::    Spoke with pt and notified of recs per SN.  Ptverbalized understanding. Stool cards up front for pick up. Follow-up by: Vernie Murders,  September 08, 2010 11:04 AM

## 2010-09-28 NOTE — Progress Notes (Signed)
Summary: requesting zpack  Phone Note Call from Patient Call back at Home Phone [phone]   Caller: Patient Call For: nadel Reason for Call: Talk to Nurse Summary of Call: Patient requesting zpack for sore throat that started yesterday.  [facility] Initial call taken by: Lehman Prom,  August 24, 2010 12:54 PM  Follow-up for Phone Call        Called, spoke with pt.  She c/o sore throat since yesterday.  Denies fever. Requesting z pak.  nkda [facility]  Last OV 08/09/2009 Pending OV 10/20/2010  Dr. Kriste Basque, pls advise.  Thanks! Follow-up by: Gweneth Dimitri RN,  August 24, 2010 2:40 PM  Additional Follow-up for Phone Call Additional follow up Details #1::        per SN---ok for pt to have zpak #1  take as directed. with no refills. this has been sent to pts pharmacy and pt is aware Randell Loop [facility]  August 24, 2010 3:37 PM     Prescriptions: Christena Deem Z-PAK 250 MG TABS (AZITHROMYCIN) as directed  #1 x 0   Entered by:   Randell Loop CMA   Authorized by:   Michele Mcalpine MD   Signed by:   Randell Loop CMA on 08/24/2010   Method used:   Electronically to        [facility]* (retail)       [address]       [place], [place]  [id number]       Ph: [phone]       Fax: [phone]   RxID:   [id number]

## 2010-09-28 NOTE — Progress Notes (Signed)
Summary: results  Phone Note Call from Patient Call back at [phone]--cell   Call For: Tarique Loveall Reason for Call: Lab or Test Results Summary of Call: Requesting stool sample results. Initial call taken by: Lehman Prom,  September 14, 2010 11:14 AM  Follow-up for Phone Call        Pt is requesting results of hemmocult cards. Please advsie. Results also printed.Carron Curie CMA  September 14, 2010 12:12 PM   Additional Follow-up for Phone Call Additional follow up Details #1::        1/6 pos for hidden blood; 5/6 neg...  [facility] 09/14/10 at 3:30PM... SN Additional Follow-up by: Michele Mcalpine MD,  September 14, 2010 3:32 PM

## 2010-09-28 NOTE — Letter (Signed)
Summary: Oncology & Endocrine/[facility]  Oncology & Endocrine/[facility]   Imported By: Sherian Rein 09/08/2010 12:41:06  _____________________________________________________________________  External Attachment:    Type:   Image     Comment:   External Document

## 2010-10-06 ENCOUNTER — Encounter: Payer: Self-pay | Admitting: Pulmonary Disease

## 2010-10-11 ENCOUNTER — Encounter: Payer: Self-pay | Admitting: Pulmonary Disease

## 2010-10-16 ENCOUNTER — Encounter: Payer: Self-pay | Admitting: Pulmonary Disease

## 2010-10-20 ENCOUNTER — Other Ambulatory Visit: Payer: Self-pay | Admitting: Pulmonary Disease

## 2010-10-20 ENCOUNTER — Encounter: Payer: Self-pay | Admitting: Pulmonary Disease

## 2010-10-20 ENCOUNTER — Other Ambulatory Visit: Payer: Medicare Other

## 2010-10-20 ENCOUNTER — Encounter (INDEPENDENT_AMBULATORY_CARE_PROVIDER_SITE_OTHER): Payer: Medicare Other | Admitting: Pulmonary Disease

## 2010-10-20 DIAGNOSIS — I059 Rheumatic mitral valve disease, unspecified: Secondary | ICD-10-CM

## 2010-10-20 DIAGNOSIS — J984 Other disorders of lung: Secondary | ICD-10-CM

## 2010-10-20 DIAGNOSIS — C8589 Other specified types of non-Hodgkin lymphoma, extranodal and solid organ sites: Secondary | ICD-10-CM

## 2010-10-20 DIAGNOSIS — M899 Disorder of bone, unspecified: Secondary | ICD-10-CM

## 2010-10-20 DIAGNOSIS — K5730 Diverticulosis of large intestine without perforation or abscess without bleeding: Secondary | ICD-10-CM

## 2010-10-20 DIAGNOSIS — C8599 Non-Hodgkin lymphoma, unspecified, extranodal and solid organ sites: Secondary | ICD-10-CM | POA: Insufficient documentation

## 2010-10-20 DIAGNOSIS — I10 Essential (primary) hypertension: Secondary | ICD-10-CM

## 2010-10-20 DIAGNOSIS — E7800 Pure hypercholesterolemia, unspecified: Secondary | ICD-10-CM

## 2010-10-20 DIAGNOSIS — M949 Disorder of cartilage, unspecified: Secondary | ICD-10-CM

## 2010-10-20 DIAGNOSIS — E785 Hyperlipidemia, unspecified: Secondary | ICD-10-CM

## 2010-10-20 DIAGNOSIS — J309 Allergic rhinitis, unspecified: Secondary | ICD-10-CM

## 2010-10-20 DIAGNOSIS — K219 Gastro-esophageal reflux disease without esophagitis: Secondary | ICD-10-CM

## 2010-10-20 LAB — URINALYSIS, ROUTINE W REFLEX MICROSCOPIC
Bilirubin Urine: NEGATIVE
Hgb urine dipstick: NEGATIVE
Ketones, ur: NEGATIVE
Leukocytes, UA: NEGATIVE
Nitrite: NEGATIVE
Specific Gravity, Urine: >=1.03 (ref 1.000–1.030)
Total Protein, Urine: NEGATIVE
Urine Glucose: NEGATIVE
Urobilinogen, UA: 0.2 (ref 0.0–1.0)
pH: 5.5 (ref 5.0–8.0)

## 2010-10-20 LAB — BASIC METABOLIC PANEL
BUN: 13 mg/dL (ref 6–23)
CO2: 31 mEq/L (ref 19–32)
Calcium: 9.9 mg/dL (ref 8.4–10.5)
Chloride: 101 mEq/L (ref 96–112)
Creatinine, Ser: 0.6 mg/dL (ref 0.4–1.2)
GFR: 100.49 mL/min (ref >60.00)
Glucose, Bld: 89 mg/dL (ref 70–99)
Potassium: 3.9 mEq/L (ref 3.5–5.1)
Sodium: 141 mEq/L (ref 135–145)

## 2010-10-20 LAB — LIPID PANEL
Cholesterol: 104 mg/dL (ref 0–200)
HDL: 37 mg/dL — ABNORMAL LOW (ref >39.00)
LDL Cholesterol: 56 mg/dL (ref 0–99)
Total CHOL/HDL Ratio: 3
Triglycerides: 53 mg/dL (ref 0.0–149.0)
VLDL: 10.6 mg/dL (ref 0.0–40.0)

## 2010-10-21 DIAGNOSIS — F411 Generalized anxiety disorder: Secondary | ICD-10-CM | POA: Insufficient documentation

## 2010-10-21 DIAGNOSIS — D126 Benign neoplasm of colon, unspecified: Secondary | ICD-10-CM | POA: Insufficient documentation

## 2010-10-21 LAB — CONVERTED CEMR LAB: Vit D, 25-Hydroxy: 41 ng/mL (ref 30–89)

## 2010-10-24 ENCOUNTER — Encounter: Payer: Self-pay | Admitting: Pulmonary Disease

## 2010-10-24 ENCOUNTER — Other Ambulatory Visit: Payer: Self-pay | Admitting: Pulmonary Disease

## 2010-10-24 ENCOUNTER — Other Ambulatory Visit: Payer: Medicare Other

## 2010-10-24 ENCOUNTER — Telehealth: Payer: Self-pay | Admitting: Pulmonary Disease

## 2010-10-24 ENCOUNTER — Encounter (INDEPENDENT_AMBULATORY_CARE_PROVIDER_SITE_OTHER): Payer: Self-pay | Admitting: *Deleted

## 2010-10-24 DIAGNOSIS — N3000 Acute cystitis without hematuria: Secondary | ICD-10-CM

## 2010-10-24 LAB — URINALYSIS, ROUTINE W REFLEX MICROSCOPIC
Bilirubin Urine: NEGATIVE
Ketones, ur: NEGATIVE
Leukocytes, UA: NEGATIVE
Nitrite: NEGATIVE
Specific Gravity, Urine: 1.01 (ref 1.000–1.030)
Total Protein, Urine: NEGATIVE
Urine Glucose: NEGATIVE
Urobilinogen, UA: 0.2 (ref 0.0–1.0)
pH: 5.5 (ref 5.0–8.0)

## 2010-10-24 NOTE — Assessment & Plan Note (Signed)
Summary: Pt states she wants to appt changed to ov instead of physical...   CC:  14 month ROV & review of medical problems....  History of Present Illness: 65 y/o WF here for a follow up visit... she has multiple medical problems as noted below...     ~  August 04, 2008:  she saw DrShoemaker last week (note pending) for chr cough and "tickle" in throat... she reports nothing found and she has since developed a sore throat, sl hoarseness, continued cough; but denies f/c/s & no phlegm...  she recalls seeing DrBurney in 2005 for "cough and nodules on my lungs" and she would like a referral to see him again to be sure that everything is OK... she would also like Tussionex for the cough & we will Rx w/ MMW for the throat symptoms.   ~  August 09, 2009:  she's had a good year- f/u [facility] GI dept w/ EGD & Colon 11/10 done & basically OK... she fell  ~80moago & [facility]- no fracure, just bruised & Rx Ibuprofen... BP is well controlled on meds; Chol doing satis on Lip20;  she would like a Vit D level checked today as well (nl @ 44)...   ~  October 20, 2010:      [name] noted some hematuria 12/11 & saw DrPeterson for eval> cysto was neg, & CT Abd done in his office revealed a 4cm mass at the root of the mesentery;  [facility] total [facility] of the mass which appeared to be a Lymphoma;  she was referred to DrRichards in Oncology & pt provides me w/ extensive records from their visit to discuss staging & Rx> this info is reviewed w/ the pt & scanned into our EMR...    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    BRaford Pitcherindicates that she [facility] (DrMorgan has left) but she was  inadeq sedated (it was very painful procedure) & she will not be returning to these doctors;  the colon revealed divertics & one 470mpolyp (hyperplastic on path);  she [facility]...    Today she wants me to check her incision & bone marrow bx sites (both clean, no drainage or erythema, [facility]);  she wants another UA & C&S (pending) to be sure there is no infection present;  she wants FLP (looks good on Lip20 + fish Oil), BMet (WNL), Vit D level (41- rec continue 1000u daily supplement), stool cards to be sure there is no hidden blood, & she requests referral to an oncologic nutritionist to discuss nutritional needs w/ her anticipated chemoRx...   Current Problems:   LYMPHOMA (ICD-202.80) >> SEE ABOVE  GLAUCOMA (ICD-365.9) - on COMBIGAN drops Bid per Ophthalomology DrStoneburner for "ocular hypertension, glaucoma, & drusen"...  ALLERGIC RHINITIS (ICD-477.9) - on CLARITIN 1064mrn...  Hx of PULMONARY NODULE (ICD-518.89) - prev CTChest w/ sm bilat nodules & no change x yrs... first scan 2003 for chr cough/  followed by DrBurney x yrs &  last CT 12/09 showed no ch in two 6mm9mdules-one RLL, one left interlobar fissure...  ~  yearly CXR's have been stable, clear & WNL (nodules not seen on regular CXR).  ~  recheck by DrBurney 12/09 w/ f/u CT Chest showing 2 subcentimeter nodules w/o change from old scans, no other signif abnormalities.  ~  CXR 12/10 is clear, NAD, no nodules seen.  ~  2011-2012> see extensive staging eval for her intra-abd lymphoma diagnosed 1/12 w/ work-up at [facility].  HYPERTENSION (ICD-401.9) - on MICARDIS 80-12.5 daily, & NORVASC 70m/d...  BP = 110/70, takes meds regularly & tolerates well... denies HA, fatigue, visual changes, palipit, dizziness, syncope, dyspnea, edema, etc...   CHEST PAIN, ACUTE (ICD-786.50) MITRAL VALVE PROLAPSE (ICD-424.0) - takes ASA 886md... she has seen DrWall in 2005, and again  4/09 for atypical CP & MVP... he recommended increased exercise program for her...   ~  2DEcho 7/03 showed mild prolapse of the ant MV leaflet...  ~  NuclearStressTest 2/05 was negative- no ischemia, no infarction, EF= 81%...  ~  Baseline EKG shows NSR, WNL, NAD...  HYPERCHOLESTEROLEMIA (ICD-272.0) - on LIPITOR 2012m,  and FISH OIL 1000m37m..  ~  FLP [place] on Lip20 showed TChol 164, TG 70, HDL 40, LDL 110  ~  FLP 12/09 on Lip20 showed TChol 170, TG 124, HDL 37, LDL 108  ~  FLP 12/10 showed TChol 156, TG 71, HDL 42, LDL 100  ~  FLP 2/12 showed TChol 104, TG 53, HDL 37, LDL 56  GERD (ICD-530.81) - on ZANTAC 150mg85m. prev on Nexium daily [facility].  ~   EGD 11/05 by DrMorJunie Spencered sm HH, min gastritis (no esophagitis and no Barrett's)...  ~  repeat EGD 11/10 by DrMorgan showed sm HH, mild gastritis... continue PPI.  DIVERTICULOSIS OF COLON (ICD-562.10) COLONIC POLYPS (ICD-211.3) Hx of HEMORRHOIDS (ICD-455.6)  ~  colonoscopy 4/05 by DrMorJunie Spencered divertics, fixed tortuous segm in sigmoid...   ~  repeat colon 11/10 by DrMorgan showed mod divertics and narrowed sigm/ stricture, no polyps... rec> FIBER!  ~  [facility] colon 1/12 [facility] showed divertics, mild angulation of the sigmoid region, one 4mm s65mile polyp in desc colon (hyperplastic)... pt indicates to me that the procedure was very painful 7 she will not be returning to DrGrimm... pt requests to establish [facility]/ [facility] follow up.  FIBROCYSTIC BREAST DISEASE (ICD-610.1) - followed by DrStreck yearly due to +FamHx of breast cancer in mother...  ~  Mammogram 9/11 w/ scat fibroglandular densities, no lesions seen, f/u 32yr su2yrted.  OSTEOPENIA (ICD-733.90) - on Calcium, Vitamins, Vit D 1000 u OTC supplement...   ~  BMD's at [facility] have been WNL...  ~  Marland Kitchenabs 12/10 showed Vit D level = 44  ~  labs 2/12 showed Vit D level = 41  HEADACHE (ICD-784.0) - hx headache eval 1996  by DrAdelmJeral Fruit..  ~  she had a neg eval by DrWillis in 1999 for numbness in fingers and toes...  ~  [facility]/ HA & CT Brain ordered by DrJGreene showed mild age related vol loss, NAD...  ANXIETY (ICD-300.00) - she takes ALPRAZOLAM 0.5mg- 1/10mo 1 tab Tid Prn...  ALOPECIA (ICD-704.00) - she had an evaluation from Dr. Russell Kellie [facility]... he recommended Black Currant and Evening Primrose Oil for her hair health, along w/ Horse Chestnut for varicose veins and a DASH diet... she also did yoga, accupuncture, massage therapy, and "body talk" therapy... [facility]  recommended Rogaine therapy...  Health Maintenance -  GYN= DrDickstein & pt states up-to-date... she saw DrKohut in 2008 for thyroid panel which was WNL.[name]. she had the Shingles vaccine 7/11.   Preventive Screening-Counseling & Management  Alcohol-Tobacco     Smoking Status: never  Allergies (verified): No Known Drug Allergies  Comments:  Nurse/Medical Assistant: The patient's medications and allergies were reviewed with the patient and were updated in the Medication and Allergy Lists.  Past History:  Past Medical History: LYMPHOMA (ICD-202.80) GLAUCOMA (ICD-365.9) ALLERGIC RHINITIS (ICD-477.9) Hx of PULMONARY NODULE (ICD-518.89) HYPERTENSION (ICD-401.9) CHEST PAIN, ACUTE (ICD-786.50) MITRAL VALVE PROLAPSE (ICD-424.0) HYPERCHOLESTEROLEMIA (ICD-272.0) GERD (ICD-530.81) DIVERTICULOSIS OF COLON (ICD-562.10) COLONIC POLYPS (ICD-211.3) HEMORRHOIDS (ICD-455.6) FIBROCYSTIC BREAST DISEASE (ICD-610.1) OSTEOPENIA (ICD-733.90) HEADACHE (ICD-784.0) ANXIETY (ICD-300.00) ALOPECIA (ICD-704.00)  Past Surgical History: S/P TAH & BSO S/P ORIF prox phalanx of right little finger 7/08 by DrKuzma S/P ELap for 4cm mesenteric mass [facility] DrCalvo> follicular lymphoma  Family History: Reviewed history and no changes required.  Social  History: Reviewed history and no changes required.  Review of Systems       The patient complains of dyspnea on exertion, constipation, abdominal pain, gas/bloating, arthritis, and anxiety.  The patient denies fever, chills, sweats, anorexia, fatigue, weakness, malaise, weight loss, sleep disorder, blurring, diplopia, eye irritation, eye discharge, vision loss, eye pain, photophobia, earache, ear discharge, tinnitus, decreased hearing, nasal congestion, nosebleeds, sore throat, hoarseness, chest pain, palpitations, syncope, orthopnea, PND, peripheral edema, cough, dyspnea at rest, excessive sputum, hemoptysis, wheezing, pleurisy, nausea, vomiting, diarrhea, change in bowel habits, melena, hematochezia, jaundice, indigestion/heartburn, dysphagia, odynophagia, dysuria, hematuria, urinary frequency, urinary hesitancy, nocturia, incontinence, back pain, joint pain, joint swelling, muscle cramps, muscle weakness, stiffness, sciatica, restless legs, leg pain at night, leg pain with exertion, rash, itching, dryness, suspicious lesions, paralysis, paresthesias, seizures, tremors, vertigo, transient blindness, frequent falls, frequent headaches, difficulty walking, depression, memory loss, confusion, cold intolerance, heat intolerance, polydipsia, polyphagia, polyuria, unusual weight change, abnormal bruising, bleeding, enlarged lymph nodes, urticaria, allergic rash, hay fever, and recurrent infections.    Vital Signs:  Patient profile:   65 year old female Height:      64 inches Weight:      157 pounds BMI:     27.05 O2 Sat:      97 % on Room air Temp:     97.0 degrees F oral Pulse rate:   82 / minute BP sitting:   110 / 70  (right arm) Cuff size:   regular  Vitals Entered By: Elita Boone CMA (October 20, 2010 9:34 AM)  O2 Sat at Rest %:  97 O2 Flow:  Room air CC: 14 month ROV & review of medical problems... Is Patient Diabetic? No Pain Assessment Patient in pain? yes      Onset of pain  at  times Comments meds updated today with pt   Physical Exam  Additional Exam:  WD, WN, 65 y/o WF in NAD... GENERAL:  Alert & oriented; pleasant & cooperative... HEENT:  [place]/AT, EOM-wnl, PERRLA, EACs-clear, TMs-wnl, NOSE-clear, THROAT-clear & wnl. NECK:  Supple w/ full ROM; no JVD; normal carotid impulses w/o bruits; no thyromegaly or nodules palpated; no lymphadenopathy. CHEST:  Clear to P & A; without wheezes/ rales/ or rhonchi. HEART:  Regular Rhythm; without murmurs/ rubs/ or gallops. ABDOMEN:  Soft & nontender; midline scar of ELap surg 1/12- clean & healing nicely; normal bowel sounds; no organomegaly or masses detected. EXT: without deformities, mild arthritic changes; no varicose veins/ +venous insuffic/ no  edema. NEURO:  CN's intact; motor testing normal; sensory testing normal; gait normal & balance OK. DERM:  No lesions noted; no rash, mild alopecia... 2 ecchymoses over iliac creasts from recent BM bx.    Impression & Recommendations:  Problem # 1:  LYMPHOMA (ICD-202.80) As noted >> she has been recently dx w/ intraabd follicular lymphoma & staging in progress... We reviewed [facility]... She requests the following labs & nutrition consult...  Orders: TLB-BMP (Basic Metabolic Panel-BMET) ([id number]) TLB-Lipid Panel (80061-LIPID) TLB-Udip w/ Micro (81001-URINE) T-Urine Culture (Spectrum Order) [id number]) T-Vitamin D (25-Hydroxy) [id number]) Nutrition Referral (Nutrition)  Problem # 2:  Hx of PULMONARY NODULE (ICD-518.89) These are benign, 73m size on last scan 2009, & not seen on her routine CXRs... She will be having CXR & PET [facility]...  Problem # 3:  HYPERTENSION (ICD-401.9) BP controlled on meds>  she wonders if BP too low & we discussed the importance of measuring BP at home & keeping a log... Pt instructed to decr the Micardis to 1/2 tab daily & follow her BP;  if pressure  remains low then she may try to lowedr the amlodipine as well o 1/2 tab... she will call w/ questions, & f/u w/ uKoreain 6-8 weeks. Her updated medication list for this problem includes:    Norvasc 10 Mg Tabs (Amlodipine besylate) ..[name].. Take 1 tablet by mouth once a day    Micardis Hct 80-12.5 Mg Tabs (Telmisartan-hctz) ..[name].. Take 1 tablet by mouth once a day  Problem # 4:  HYPERCHOLESTEROLEMIA (ICD-272.0) FLP looks good on Lip20>  rec continue same, incr exercise... Her updated medication list for this problem includes:    Lipitor 20 Mg Tabs (Atorvastatin calcium) ..[name].. Take 1 tablet by mouth once a day  Problem # 5:  DIVERTICULOSIS OF COLON (ICD-562.10) GI> [facility] & she will not be reurning to DrGrimm due to pain on colonoscopy 1/12... She has Divertics, narrowed sigmiod region w/ angulation, one 461mdesc colon hyperpl polyp removed 1/12, hx hems... She knows to incr fiber & monitor BMs... DrRichards switched her PPI to Zantac... We will arrange for GI f/u w/ DrMedoff in [place]...  Problem # 6:  GU>> She had one epis hematuria & [place] w/ neg cysto & upper tracks OK by CT (it was this CT scan that showed the mesenteric mass ==> Lymphoma on subseq bx)...  Problem # 7:  FIBROCYSTIC BREAST DISEASE (ICD-610.1) Followed yearly by DrStreck (w/ mammograms) due to pts concern over +FamHx breast cancer in mother... All studies neg so far...  Problem # 8:  OTHER MEDICAL PROBLEMS AS NOTED>>> She had Shingles vaccine 7/11...  Complete Medication List: 1)  Combigan 0.2-0.5 % Soln (Brimonidine tartrate-timolol) .... Use two times a day 2)  Claritin 10 Mg Tabs (Loratadine) .... As needed 3)  Adult Aspirin Ec Low Strength 81 Mg Tbec (Aspirin) .... Take 1 tablet by mouth once a day 4)  Norvasc 10 Mg Tabs (Amlodipine besylate) .... Take 1 tablet by mouth once a day 5)  Micardis Hct 80-12.5 Mg Tabs (Telmisartan-hctz) .... Take 1 tablet by mouth once a  day 6)  Lipitor 20 Mg Tabs (Atorvastatin calcium) .... Take 1 tablet by mouth once a day 7)  Fish Oil 1000 Mg Caps (Omega-3 fatty acids) .... Take 1 tablet by mouth once a day 8)  Zantac 150 Mg Tabs (Ranitidine hcl) ...Marland KitchenMarland KitchenMarland Kitchen  Take one daily 9)  Metamucil 30.9 % Powd (Psyllium) .... Take daily as directed... 10)  Colace 100 Mg Caps (Docusate sodium) .... Take once daily 11)  Citracal Plus Tabs (Multiple minerals-vitamins) .... Take 1 tablet by mouth once a day 12)  Centrum Tabs (Multiple vitamins-minerals) .... Take 1 tablet by mouth once a day 13)  Preservision Areds Caps (Multiple vitamins-minerals) .... Take 2 daily 14)  Vitamin D 1000 Unit Tabs (Cholecalciferol) .... Take 1 tablet by mouth once a day 15)  Alprazolam 0.5 Mg Tabs (Alprazolam) .... Take 1/2 to 1 tab by mouth three times a day as needed for nerves  Patient Instructions: 1)  Today we updated your med list- see below.... 2)  Let us know if you need any refills... 3)  Today we did a Lipid profile, BMet (to check blood sugar), Vit D level & urinalysis & culture > as you requested...  4)  We will call you w/ the results & send a copy to you for your records... 5)  Please collect the stool cards and mail those back at your convenience... 6)  We will arrange for a nutrition consult... 7)  Good luck w/ the Staging work up at [facility] & w/ the initiation of chemotherapy... 8)  Call for any problems.Marland KitchenMarland Kitchen 9)  Let's plan a recheck in several months as discussed.Marland KitchenMarland Kitchen

## 2010-10-24 NOTE — Letter (Signed)
Summary: Oncology & Endocrine/[facility]  Oncology & Endocrine/[facility]   Imported By: Sherian Rein 10/17/2010 11:43:54  _____________________________________________________________________  External Attachment:    Type:   Image     Comment:   External Document

## 2010-11-02 NOTE — Letter (Signed)
Summary: Wendee Copp MD/[facility]  Wendee Copp MD/[facility]   Imported By: Lester [place] 10/24/2010 09:38:39  _____________________________________________________________________  External Attachment:    Type:   Image     Comment:   External Document

## 2010-11-02 NOTE — Progress Notes (Signed)
Summary: UA and stool card  Phone Note Call from Patient Call back at Home Phone [phone] [facility]     Caller: Patient Call For: nadel Reason for Call: Talk to Nurse Summary of Call: Patient in lobby.  She is requesting a UA and stool cards, patient states her oncology doctor is requesting this. Initial call taken by: Mateo Flow,  October 24, 2010 10:29 AM  Follow-up for Phone Call        spoke with pt and she stated that she dropped her stool cards in the toilet and needed to get another card to start over----this has been done.  pt stated that she just does not feel well and her oncologist wanted her to repeat the ua and culture and pt will come back by later today to pick up the ua results for her appt with oncology tomorrow---pts cell number is [place]  October 24, 2010 11:37 AM   Additional Follow-up for Phone Call Additional follow up Details #1::        per SN---ok for pt to pick up copy of the ua---so she can take this to the oncologist tomorrow Elita Boone CMA  October 24, 2010 2:26 PM n

## 2010-11-02 NOTE — Procedures (Signed)
Summary: Colon/Benjamin Aurelio Jew MD/[facility]  Colon/Benjamin Aurelio Jew MD/[facility]   Imported By: Bubba Hales 10/24/2010 08:27:16  _____________________________________________________________________  External Attachment:    Type:   Image     Comment:   External Document

## 2010-11-06 ENCOUNTER — Ambulatory Visit: Payer: Medicare Other | Admitting: Dietician

## 2010-11-06 ENCOUNTER — Telehealth: Payer: Self-pay | Admitting: Pulmonary Disease

## 2010-11-09 ENCOUNTER — Telehealth: Payer: Self-pay | Admitting: Cardiology

## 2010-11-14 NOTE — Progress Notes (Signed)
Summary: copy of urine culture  Phone Note Call from Patient   Caller: Patient Call For: Colleen White Summary of Call: pt wants results of last labs because she needs them for [place]. cell [phone] Initial call taken by: Tivis Ringer, CNA,  November 06, 2010 9:48 AM  Follow-up for Phone Call        ? which labs? LMOMTCB Vernie Murders  November 06, 2010 10:08 AM  Spoke with pt and she states that she needs a copy of her urine culture resutls to take with her to [place] to her oncologists. I spoke to [facility] and she states ok per SN to give copy of results. Results left at front. Pt aware. Carron Curie CMA  November 06, 2010 11:48 AM

## 2010-11-14 NOTE — Progress Notes (Addendum)
Summary: c/o b/p issues   Phone Note Call from Patient Call back at Home Phone [phone]   Caller: Patientc- [phone]- cell phone  Reason for Call: Talk to Nurse Summary of Call: c/o b/p issue. 100/15. pulse 72. pt saw tw in 12/22/07. Initial call taken by: Lorne Skeens,  November 09, 2010 12:21 PM  Follow-up for Phone Call        [facility] Follow-up by: Lisabeth Devoid RN,  November 10, 2010 5:39 PM

## 2010-11-17 ENCOUNTER — Telehealth: Payer: Self-pay | Admitting: Pulmonary Disease

## 2010-11-17 MED ORDER — AZITHROMYCIN 250 MG PO TABS: ORAL_TABLET | ORAL | Status: AC

## 2010-11-17 NOTE — Telephone Encounter (Signed)
Spoke with pt and she states she has had a sore throat x 1 day and is requesting an rx for zpak. She states her husband has been sick and she thinks she is starting to get what he had. She also states she is set to start chemo in 2 weeks and does not want to be sick and have this postponed. Please advise. Carron Curie, MA Allergies: NKDA

## 2010-11-17 NOTE — Telephone Encounter (Signed)
Per SN---ok for pt to have zpak  #1 take as directed with no refills and this has been sent to [place] per pts request.  Pt is aware.

## 2010-11-20 ENCOUNTER — Encounter: Payer: Self-pay | Admitting: Pulmonary Disease

## 2010-11-21 ENCOUNTER — Ambulatory Visit: Payer: Medicare Other | Admitting: Pulmonary Disease

## 2010-11-23 ENCOUNTER — Encounter: Payer: Medicare Other | Attending: Pulmonary Disease | Admitting: Dietician

## 2010-11-23 ENCOUNTER — Other Ambulatory Visit: Payer: Self-pay | Admitting: Pulmonary Disease

## 2010-11-23 ENCOUNTER — Other Ambulatory Visit: Payer: Medicare Other

## 2010-11-23 DIAGNOSIS — Z1289 Encounter for screening for malignant neoplasm of other sites: Secondary | ICD-10-CM

## 2010-11-23 DIAGNOSIS — Z713 Dietary counseling and surveillance: Secondary | ICD-10-CM | POA: Insufficient documentation

## 2010-11-23 DIAGNOSIS — Z724 Inappropriate diet and eating habits: Secondary | ICD-10-CM | POA: Insufficient documentation

## 2010-11-23 LAB — HEMOCCULT SLIDES (X 3 CARDS)
Fecal Occult Blood: NEGATIVE
OCCULT 1: NEGATIVE
OCCULT 2: NEGATIVE
OCCULT 3: NEGATIVE
OCCULT 4: NEGATIVE
OCCULT 5: NEGATIVE

## 2010-11-27 ENCOUNTER — Telehealth: Payer: Self-pay | Admitting: Pulmonary Disease

## 2010-11-27 NOTE — Telephone Encounter (Signed)
Spoke with pt and advised her all stool cards came back negative. She verbalized understanding

## 2010-12-09 DIAGNOSIS — C8290 Follicular lymphoma, unspecified, unspecified site: Secondary | ICD-10-CM | POA: Insufficient documentation

## 2010-12-12 ENCOUNTER — Telehealth: Payer: Self-pay | Admitting: Pulmonary Disease

## 2010-12-12 NOTE — Telephone Encounter (Signed)
Pt c/o weakness and BP unstable. I offered pt appointment with TP today and tomorrow, pt is out of town and will not return until Thursday no open slots on SN schedule. Pt scheduled to see TP 12/20/10 @ 9 am. Please advise if there is an earlier date and time. Thanks.

## 2010-12-12 NOTE — Telephone Encounter (Signed)
Pt aware of OV with SN on Thurs., 12/14/2010 @ 3:30 pm. She will call if not able to make this appt.

## 2010-12-12 NOTE — Telephone Encounter (Signed)
Can add pt on for Thursday afternoon at 3:30. thanks

## 2010-12-14 ENCOUNTER — Encounter: Payer: Self-pay | Admitting: Pulmonary Disease

## 2010-12-14 ENCOUNTER — Ambulatory Visit (INDEPENDENT_AMBULATORY_CARE_PROVIDER_SITE_OTHER): Payer: Medicare Other | Admitting: Pulmonary Disease

## 2010-12-14 DIAGNOSIS — J984 Other disorders of lung: Secondary | ICD-10-CM

## 2010-12-14 DIAGNOSIS — C8589 Other specified types of non-Hodgkin lymphoma, extranodal and solid organ sites: Secondary | ICD-10-CM

## 2010-12-14 DIAGNOSIS — I10 Essential (primary) hypertension: Secondary | ICD-10-CM

## 2010-12-14 DIAGNOSIS — E7800 Pure hypercholesterolemia, unspecified: Secondary | ICD-10-CM

## 2010-12-14 NOTE — Patient Instructions (Signed)
We updated your med list in our new EPIC system...  We discussed adjusting your 2 BP meds depending on how your blood pressure reads & how you feel:    If your BP is too low in the AM, or you feel too tired/ weak> then take only 1/2 of the MICARDIS that day...    If your BP is too low in the PM, or you feel too tired/ weak> then take only 1/4th of the NORVASC (instead of the usual 1/2 tab)...    If your BP STAYS too low, then we may be able to stop one of these meds for the time being...  Good luck w/ the upcoming XRT at [facility], let us know if we can be of assistance in any way.Marland KitchenMarland Kitchen

## 2010-12-15 ENCOUNTER — Encounter: Payer: Self-pay | Admitting: Pulmonary Disease

## 2010-12-15 NOTE — Progress Notes (Signed)
Subjective:    Patient ID: Colleen White, female    DOB: 03-12-1946, 65 y.o.   MRN: [id number]  HPI 65 y/o WF here for a follow up visit... she has multiple medical problems as noted below...    ~  August 09, 2009:  she's had a good year- f/u [facility] GI dept w/ EGD & Colon 11/10 done & basically OK... she fell ~57mo ago & seen by [facility]- no fracure, just bruised & Rx Ibuprofen... BP is well controlled on meds; Chol doing satis on Lip20;  she would like a Vit D level checked today as well (nl @ 44)...  ~  October 20, 2010:   Colleen White noted some hematuria 12/11 & saw DrPeterson for eval> cysto was neg, & CT Abd done in his office revealed a 4cm mass at the root of the mesentery;  she chose to go to [facility] for eval of this mass & had ELap 09/26/10 by [facility] w/ gross total resection of the mass which appeared to be a Lymphoma;  she was referred to DrRichards in Oncology & pt provides me w/ extensive records from their visit to discuss staging & Rx> this info is reviewed w/ the pt & scanned into our EMR...    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    Colleen White indicates that she had another colonoscopy done at [facility] by DrGrimm & Crockett (DrMorgan has left) but she was inadeq sedated (it was very painful procedure) & she will not be returning to these doctors;  the colon revealed divertics & one 4mm polyp (hyperplastic on path);  she requests referral to [facility] to establish GI care...    Today she wants me to check her incision & bone marrow bx sites (both clean, no drainage or erythema, just bruising at [facility] sites over iliac area);  she wants another UA & C&S (pending) to be sure there is no infection present;  she wants FLP (looks good on Lip20 + fish Oil), BMet (WNL), Vit D level (41- rec continue 1000u  daily supplement), stool cards to be sure there is no hidden blood, & she requests referral to an oncologic nutritionist to discuss nutritional needs w/ her anticipated chemoRx.[name].  ~  April 19,2012:  Colleen White requested add-on appt to discuss her BP> she brings home record w/ BPs on the low side & had prev discussed this w/ DrWall & they decr her Norvasc from 10mg  to 5mg /d (taking 1/2 tab now);  Currently BPs are 120-140 sys but occas down to 100 & she will feel weak when the BP is that low;  We discussed the situation (esp in light of her upcoming XRT at [facility] for her Lymphoma)> rec to take her MicardisHCT in AM at 1/2 to 1 tab dose depending on the BP reading & how she feels; and take the Amlodipine 10mg tabs- 1/4 to 1/2 tab in the PM similarly depending on her BP reading & how she feels... She understands the rationale here & agrees w/ this plan.        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 Wendee Copp is her Oncologist...  GLAUCOMA (ICD-365.9) - on COMBIGAN  drops Bid per Ophthalomology DrStoneburner for "ocular hypertension, glaucoma, & drusen"...  ALLERGIC RHINITIS (ICD-477.9) - on CLARITIN 10mg  Prn...  Hx of PULMONARY NODULE (ICD-518.89) - prev CTChest w/ sm bilat nodules & no change x yrs... first scan 2003 for chr cough/  followed by DrBurney x yrs &  last CT 12/09 showed no ch in two 6mm nodules-one RLL, one left interlobar fissure... ~  yearly CXR's have been stable, clear & WNL (nodules not seen on regular CXR). ~  recheck by DrBurney 12/09 w/ f/u CT Chest showing 2 subcentimeter nodules w/o change from old scans, no other signif abnormalities. ~  CXR 12/10 is clear, NAD, no nodules seen. ~  2011-2012> see extensive staging eval for her intra-abd lymphoma diagnosed 1/12 w/ work-up at [facility].  HYPERTENSION (ICD-401.9) - on MICARDIS 80-12.5 daily, & NORVASC  10mg /d...   ~  2/12:  BP = 110/70, takes meds regularly & tolerates well... denies HA, fatigue, visual changes, palipit, dizziness, syncope, dyspnea, edema, etc...  ~  4/12:  SEE ABOVE> rec to take her MicardisHCT in AM at 1/2 to 1 tab dose depending on the BP reading & how she feels; and take the Amlodipine 10mg tabs- 1/4 to 1/2 tab in the PM similarly depending on her BP reading & how she feels... She understands the rationale here & agrees w/ this plan.  CHEST PAIN, ACUTE (ICD-786.50) MITRAL VALVE PROLAPSE (ICD-424.0) - takes ASA 81mg /d... she has seen DrWall in 2005, and again 4/09 for atypical CP & MVP... he recommended increased exercise program for her...  ~  2DEcho 7/03 showed mild prolapse of the ant MV leaflet... ~  NuclearStressTest 2/05 was negative- no ischemia, no infarction, EF= 81%... ~  Baseline EKG shows NSR, WNL, NAD...  HYPERCHOLESTEROLEMIA (ICD-272.0) - on LIPITOR 20mg /d,  and FISH OIL 1000mg /d... ~  FLP 12/08 on Lip20 showed TChol 164, TG 70, HDL 40, LDL 110 ~  FLP 12/09 on Lip20 showed TChol 170, TG 124, HDL 37, LDL 108 ~  FLP 12/10 showed TChol 156, TG 71, HDL 42, LDL 100 ~  FLP 2/12 showed TChol 104, TG 53, HDL 37, LDL 56  GERD (ICD-530.81) - on ZANTAC 150mg /d... prev on Nexium daily but DrRichards at [facility] preferred H2Blocker long term. ~   EGD 11/05 by [facility] showed sm HH, min gastritis (no esophagitis and no Barrett's)... ~  repeat EGD 11/10 by DrMorgan showed sm HH, mild gastritis... continue PPI.  DIVERTICULOSIS OF COLON (ICD-562.10) COLONIC POLYPS (ICD-211.3) Hx of HEMORRHOIDS (ICD-455.6) ~  colonoscopy 4/05 by [facility] showed divertics, fixed tortuous segm in sigmoid...  ~  repeat colon 11/10 by DrMorgan showed mod divertics and narrowed sigm/ stricture, no polyps... rec> FIBER! ~  repeat colon 1/12 by [facility]Grimm [facility] showed divertics, mild angulation of the sigmoid region, one 4mm sessile polyp in desc colon (hyperplastic)... pt indicates to me that  the procedure was very painful 7 she will not be returning to DrGrimm... pt requests to establish w/ [facility] for GI follow up.  FIBROCYSTIC BREAST DISEASE (ICD-610.1) - followed by DrStreck yearly due to +FamHx of breast cancer in mother... ~  Mammogram 9/11 w/ scat fibroglandular densities, no lesions seen, f/u 5yr suggested.  OSTEOPENIA (ICD-733.90) - on Calcium, Vitamins, Vit D 1000 u OTC supplement...  ~  BMD's at [facility] have been WNL.[name]. ~  labs 12/10 showed Vit D level = 44 ~  labs 2/12 showed Vit D level = 41  HEADACHE (ICD-784.0) - hx headache eval 1996 by [facility]  et al... ~  she had a neg eval by DrWillis in 1999 for numbness in fingers and toes... ~  she went to the [facility] 9/11 w/ HA & CT Brain ordered by DrJGreene showed mild age related vol loss, NAD...  ANXIETY (ICD-300.00) - she takes ALPRAZOLAM 0.5mg - 1/2 to 1 tab Tid Prn...  ALOPECIA (ICD-704.00) - she had an evaluation from Dr. Aurea Graff at [facility] in [place] in 2002... he recommended Black Currant and Evening Primrose Oil for her hair health, along w/ Horse Chestnut for varicose veins and a DASH diet... she also did yoga, accupuncture, massage therapy, and "body talk" therapy... Dermatologists at [facility] recommended Rogaine therapy...  Health Maintenance -  GYN= DrDickstein & pt states up-to-date... she saw DrKohut in 2008 for thyroid panel which was WNL.[name]. she had the Shingles vaccine 7/11.   Past Surgical History  Procedure Date  . S/p tah/bso   . S/p orif prox phalanx 02/2007    right little finger----Dr Merlyn Lot  . 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 Chauncey Mann, [facility]    Outpatient Encounter Prescriptions as of 12/14/2010  Medication Sig Dispense Refill  . ALPRAZolam (XANAX) 0.5 MG tablet 1/2 to 1 tab by mouth three times daily as needed for nerves       . amLODipine (NORVASC) 5 MG tablet Take 5 mg by mouth daily.        [name] aspirin 81 MG tablet Take 81 mg by mouth  daily.        [name] atorvastatin (LIPITOR) 20 MG tablet Take 20 mg by mouth daily.        . brimonidine-timolol (COMBIGAN) 0.2-0.5 % ophthalmic solution Place 1 drop into both eyes every 12 (twelve) hours.        . cholecalciferol (VITAMIN D) 1000 UNITS tablet Take 1,000 Units by mouth daily.        [name] docusate sodium (COLACE) 100 MG capsule Take once daily       . esomeprazole (NEXIUM) 40 MG capsule Take 40 mg by mouth daily before breakfast.        . fish oil-omega-3 fatty acids 1000 MG capsule Take 1 g by mouth daily.        [name] loratadine (CLARITIN) 10 MG tablet Take 10 mg by mouth daily. As needed       . Multiple Minerals-Vitamins (CITRACAL PLUS) TABS 1 tab by mouth daily       . Multiple Vitamins-Minerals (CENTRUM) tablet Take 1 tablet by mouth daily.        . psyllium (METAMUCIL) 58.6 % packet Take 1 packet by mouth daily.        [name] tretinoin (RETIN-A) 0.05 % cream Once daily       . Multiple Vitamins-Minerals (OCUVITE PRESERVISION PO) 2 by mouth daily       . telmisartan-hydrochlorothiazide (MICARDIS HCT) 80-12.5 MG per tablet Take 1 tablet by mouth daily.        [name] DISCONTD: amLODipine (NORVASC) 10 MG tablet Take 10 mg by mouth daily.        [name] DISCONTD: psyllium (METAMUCIL) 58.6 % powder Take daily as directed       . DISCONTD: ranitidine (ZANTAC) 150 MG tablet Take 150 mg by mouth at bedtime.          No Known Allergies   Review of Systems        The patient complains of dyspnea on exertion, constipation, abdominal pain, gas/bloating, arthritis, and anxiety.  The patient denies fever, chills, sweats, anorexia, fatigue, weakness, malaise, weight loss, sleep disorder, blurring, diplopia, eye irritation, eye discharge, vision loss, eye pain, photophobia, earache, ear discharge, tinnitus, decreased hearing, nasal congestion, nosebleeds, sore throat, hoarseness, chest pain, palpitations, syncope, orthopnea, PND, peripheral edema, cough, dyspnea at rest, excessive sputum, hemoptysis, wheezing,  pleurisy, nausea, vomiting, diarrhea, change in bowel habits, melena, hematochezia, jaundice, indigestion/heartburn, dysphagia, odynophagia, dysuria, hematuria, urinary frequency, urinary hesitancy, nocturia, incontinence, back pain, joint pain, joint swelling, muscle cramps, muscle weakness, stiffness, sciatica, restless legs, leg pain at night, leg pain with exertion, rash, itching, dryness, suspicious lesions, paralysis, paresthesias, seizures, tremors, vertigo, transient blindness, frequent falls, frequent headaches, difficulty walking, depression, memory loss, confusion, cold intolerance, heat intolerance, polydipsia, polyphagia, polyuria, unusual weight change, abnormal bruising, bleeding, enlarged lymph nodes, urticaria, allergic rash, hay fever, and recurrent infections.     Objective:   Physical Exam      WD, WN, 65 y/o WF in NAD... GENERAL:  Alert & oriented; pleasant & cooperative... HEENT:  Mosinee/AT, EOM-wnl, PERRLA, EACs-clear, TMs-wnl, NOSE-clear, THROAT-clear & wnl. NECK:  Supple w/ full ROM; no JVD; normal carotid impulses w/o bruits; no thyromegaly or nodules palpated; no lymphadenopathy. CHEST:  Clear to P & A; without wheezes/ rales/ or rhonchi. HEART:  Regular Rhythm; without murmurs/ rubs/ or gallops. ABDOMEN:  Soft & nontender; midline scar of ELap surg 1/12- clean & healing nicely; normal bowel sounds; no organomegaly or masses detected. EXT: without deformities, mild arthritic changes; no varicose veins/ +venous insuffic/ no edema. NEURO:  CN's intact; motor testing normal; sensory testing normal; gait normal & balance OK. DERM:  No lesions noted; no rash, mild alopecia... 2 ecchymoses over iliac creasts from recent BM bx.    Assessment & Plan:   HBP>  rec to take her MicardisHCT in AM at 1/2 to 1 tab dose depending on the BP reading & how she feels; and take the Amlodipine 10mg tabs- 1/4 to 1/2 tab in the PM similarly depending on her BP reading & how she feels... She  understands the rationale here & agrees w/ this plan.  LYMPHOMA>  Managed by DrRichards at [facility]...  Hx 2 benign pulm nodules>  No change x yrs on XRays...  CHOL>  Stable on Lip20 + FishOil...  Other medical problems as listed above.Marland KitchenMarland KitchenMarland Kitchen

## 2010-12-20 ENCOUNTER — Ambulatory Visit: Payer: Medicare Other | Admitting: Adult Health

## 2010-12-26 ENCOUNTER — Ambulatory Visit: Payer: Medicare Other | Admitting: Pulmonary Disease

## 2011-01-09 NOTE — Letter (Signed)
August 11, 2008   [name]. [name], [name], [name]   Re:  Colleen White, WECKER                  DOB:  1946-08-06   Dear Nicki Reaper:   I saw the patient in the office today.  She comes in today after having  a physical last week.  Her chest x-ray is the same.  It still shows a  lot of inflammation in her lungs.  Lungs are clear to auscultation and  percussion.  Blood pressure was 135/90, pulse 97, respirations 18, and  sats were 95%.  She is still concerned about the small pulmonary nodules  that we last saw in 2005.  I did not see any big change in her chest x-  ray, but because of her concern, we will go ahead and repeat a CT scan  and see her back again in 1 week.  I appreciate the opportunity of  seeing the patient.   Nicanor Alcon, M.D.  Electronically Signed   DPB/MEDQ  D:  08/11/2008  T:  08/11/2008  Job:  [id number]

## 2011-01-09 NOTE — Assessment & Plan Note (Signed)
[place] OFFICE NOTE   NAME:Tripathi, INDIYAH PAONE                   MRN:          [id number]  DATE:12/22/2007                            DOB:          1946-05-07    CARDIOLOGY CONSULT:  Ms. Garguilo comes in today for the evaluation of chest discomfort.  She  is sent by way of Dr. Teressa Lower for consultation.   Colleen White is a delightful 65 year old white female who I saw about 3-  1/2 years ago for chest pain.  She has a history of mitral valve  prolapse.  At that time, because of her history of hypertension and  hyperlipidemia, we did a stress Myoview; this was on October 07, 2003;  that is actually 4 years ago.   She had no evidence of ischemia, EF 81%.  She had fairly good exercise  tolerance.   Last week while at work, she had a sharp stabbing pain that went through  the left side of her chest, up into her mid sternum; it lasted for few  minutes.  It did not go up into the neck and down the arms.  It was not  associated with any nausea, vomiting, or diaphoresis.  She has had no  further pain since that time.  She saw Dr. Teressa Lower on that day and  her EKG was stable.  Her exam was benign.   She denies any fever, chills, hemoptysis or productive cough.  She has  had no gastroesophageal reflux or any problems with swallowing.  She  denies any abdominal problems and no sign of melena or hematochezia.    Her risk factors include hypertension, age, and hyperlipidemia.  Her  most recent lipids on 20 of Lipitor showed a total cholesterol of 164,  triglycerides of 70, HDL of 40, which is actually pretty good for her,  VLDL of only 14, LDL of 110.   ALLERGIES:  She has no known drug allergies.   PAST MEDICAL HISTORY:  1. She has a history of allergic rhinitis.  2. She has a history gastroesophageal reflux, fibrocystic breast      disease, diverticulosis of colon and hemorrhoids.   PAST SURGICAL  HISTORY:  Status post TAH and BSO.   SOCIAL HISTORY:  Please see the chart for previous details.   REVIEW OF SYSTEMS:  Other than the HPI is negative.   PHYSICAL EXAMINATION:  She is in no acute distress.  Her skin is warm  and dry.  Blood pressure is 120/85, her pulse 84 and regular.  Weight is 171, down  5.  HEENT:  Normocephalic, atraumatic.  PERRLA.  Extraocular movements  intact.  Sclerae clear.  Facial symmetry is normal.  Dentition  satisfactory.  NECK:  Supple.  Carotids are equal bilateral without bruits.  No JVD.  Thyroid is not enlarged.  Trachea is midline.  LUNGS:  Clear.  There is no rub.  There are no adventitial sounds.  HEART:  Reveals a nondisplaced PMI, normal S1-S2.  ABDOMEN:  Soft, good bowel sounds.  No midline bruits.  EXTREMITIES:  No cyanosis, clubbing or  edema.  Pulses are intact.  NEUROLOGIC:  Exam is intact.  There is no sign of DVT.   ASSESSMENT:  1. Noncardiac chest pain.  2. Mitral valve prolapse, which seems to be stable.  3. Hypertension, under good control.  4. Hyperlipidemia.   I had a long talk with Ms. Fobes today.  I have made no change in  medical program.  Reassurance was given.  I have asked her to try to  walk 3 hours a week or more; this would lower her risk of a premature  cardiovascular event by another 20% to 25%.  I will plan on seeing her  back on a p.r.n. basis.     Thomas C. Verl Blalock, MD, [facility]  Electronically Signed    TCW/MedQ  DD: 12/22/2007  DT: 12/22/2007  Job #: [id number]   cc:   [name]. [name], MD

## 2011-01-09 NOTE — Op Note (Signed)
NAME:  Colleen White, Colleen White NO.:  [id number]   MEDICAL RECORD NO.:  [id number]          PATIENT TYPE:  AMB   LOCATION:  [place]                          FACILITY:  [facility]   PHYSICIAN:  Daryll Brod, M.D.       DATE OF BIRTH:  Jan 12, 1946   DATE OF PROCEDURE:  03/27/2007  DATE OF DISCHARGE:                               OPERATIVE REPORT   PREOPERATIVE DIAGNOSIS:  Fracture intra-articular base proximal phalanx  right little finger metacarpophalangeal joint.   POSTOPERATIVE DIAGNOSIS:  Fracture intra-articular base proximal phalanx  right little finger metacarpophalangeal joint.   OPERATION:  Open reduction and internal fixation proximal phalanx radial  collateral ligament fracture right little finger.   SURGEON:  Daryll Brod, M.D.   ASSISTANTDimas Millin   ANESTHESIA:  General.   HISTORY:  The patient is a 65 year old female who suffered a fall with  an intra-articular fracture of the base of her proximal phalanx radial  aspect right little finger.  This is rotated and displaced. She is  admitted for open reduction and internal fixation.  She is aware of  risks and complications including infection, recurrence, injury to  arteries, nerves, and tendons, incomplete relief of symptoms, dystrophy.  In the preoperative area, the patient is seen, the extremity marked by  both the patient and surgeon, questions encouraged and answered,  antibiotic given.   PROCEDURE:  The patient was then brought to the operating room.  A  general anesthetic was carried out without difficulty.  She was prepped  using DuraPrep, supine position, right arm free.  The limb was  exsanguinated with an Esmarch bandage, a tourniquet was placed high on  the arm and was inflated to 250 mmHg.  A curvilinear incision was made  over the fifth metacarpal phalangeal joint proximal phalanx, carried  down through subcutaneous tissue.  Bleeders were electrocauterized.  The  extensor tendon was split longitudinally.   The joint was then opened  allowing visualization of the attachment of the radial collateral  ligament.  The fracture fragments were found to be angulated and  rotated. The joint was then cleared and irrigated.  The fracture  fragment was opened.  A House curet was then used to debride the  fracture fragment base.  The fracture was then reduced, maintained in  position, pinned with a 1 mm pin.  This was then imaged under image  intensification and the fracture found to be reduced position.  The pin  was removed.  The screw hole was then filled with a 10 mm screw after  measuring. This, however, did not stabilize the fracture.  This was  removed.  A second 1.5 mm screw was then inserted.  This was a 12 mm  screw.  A second one was then placed slightly more volarly at an angle  into the fracture fragment.  X-rays in the AP, lateral, and oblique  direction revealed the fracture fragment was maintained in position with  good reconstitution of the articular surface.  The wound was copiously  irrigated with saline.  The capsule was closed with figure-of-eight and  running 6-0 chromic sutures.  The extensor tendon was then repaired with  a running 5-0 Mersilene suture and the skin with  interrupted 4-0 Vicryl Rapide sutures.  A sterile compressive dressing  and splint to the hand were applied.  The patient tolerated the  procedure well and was taken to the recovery room for observation in  satisfactory condition.  She will be discharged home to return to the  [facility] in one week on Percocet.           ______________________________  Daryll Brod, M.D.     GK/MEDQ  D:  03/27/2007  T:  03/27/2007  Job:  [id number]   cc:   [name]. [name], MD

## 2011-01-09 NOTE — Assessment & Plan Note (Signed)
OFFICE VISIT   Colleen White, Colleen White  DOB:  [birth year]                                        August 18, 2008  CHART #:  [id number]   The patient is back today for a followup of her CT scan.  Her blood  pressure was 118/80, pulse 74, respirations 18, and sats were 99%.  Her  CT scan showed no evidence of recurrence.  The small nodules were  unchanged, it has been over 2 years and I do not feel that this needs  any further followup.  She still has a chronic cough, which could be  followed by Dr. [name].  I will see her again if she has any future  problems.   Nicanor Alcon, M.D.  Electronically Signed   DPB/MEDQ  D:  08/18/2008  T:  08/18/2008  Job:  [id number]   cc:   [name]. [name], MD

## 2011-01-12 ENCOUNTER — Telehealth: Payer: Self-pay | Admitting: Pulmonary Disease

## 2011-01-12 NOTE — Telephone Encounter (Signed)
Spoke with and notified of recs per TP.  She verbalized understanding.

## 2011-01-12 NOTE — Telephone Encounter (Signed)
Spoke w/ pt and she states she is having a lot of hair loss. Pt states she finished radiation at [place] on Sunday. Pt states in the past she was giving Remeron 15 mg and it helped out a lot. Pt states she has an apt w/ dermatology in [place] on 6/11. Pt states she just wants to go ahead and get started on this before she loses all her hair. Will forward to Tammy since Dr. [name] is not in the office. Please advise Tammy. Thanks  Charma Igo, [place]

## 2011-01-12 NOTE — Telephone Encounter (Signed)
Note: pt stated that she has taken this in the past for her alopecia- 21m - 1 at bedtime. Ms.

## 2011-01-12 NOTE — Telephone Encounter (Signed)
I am sorry do not see that she was on this prior  There is no mention of this as treatment for her alopecia in Dr. Kriste Basque  Notes If this was  Given by oncology or derm they will have to give this rx  Please contact office for sooner follow up if symptoms do not improve or worsen or seek emergency care '

## 2011-01-17 ENCOUNTER — Other Ambulatory Visit: Payer: Self-pay | Admitting: Pulmonary Disease

## 2011-01-19 ENCOUNTER — Other Ambulatory Visit: Payer: Self-pay | Admitting: Pulmonary Disease

## 2011-01-29 ENCOUNTER — Encounter (INDEPENDENT_AMBULATORY_CARE_PROVIDER_SITE_OTHER): Payer: Self-pay | Admitting: Surgery

## 2011-02-07 ENCOUNTER — Other Ambulatory Visit: Payer: Self-pay | Admitting: Obstetrics & Gynecology

## 2011-02-08 ENCOUNTER — Ambulatory Visit
Admission: RE | Admit: 2011-02-08 | Discharge: 2011-02-08 | Disposition: A | Discharge status: Home / Self Care | Payer: Medicare Other | Source: Ambulatory Visit | Attending: Obstetrics & Gynecology | Admitting: Obstetrics & Gynecology

## 2011-02-16 ENCOUNTER — Other Ambulatory Visit: Payer: Self-pay | Admitting: Pulmonary Disease

## 2011-02-16 DIAGNOSIS — K5730 Diverticulosis of large intestine without perforation or abscess without bleeding: Secondary | ICD-10-CM

## 2011-02-16 DIAGNOSIS — C8589 Other specified types of non-Hodgkin lymphoma, extranodal and solid organ sites: Secondary | ICD-10-CM

## 2011-02-16 DIAGNOSIS — I10 Essential (primary) hypertension: Secondary | ICD-10-CM

## 2011-02-16 DIAGNOSIS — F411 Generalized anxiety disorder: Secondary | ICD-10-CM

## 2011-02-16 DIAGNOSIS — E7800 Pure hypercholesterolemia, unspecified: Secondary | ICD-10-CM

## 2011-02-16 DIAGNOSIS — D126 Benign neoplasm of colon, unspecified: Secondary | ICD-10-CM

## 2011-02-19 ENCOUNTER — Encounter: Payer: Self-pay | Admitting: Pulmonary Disease

## 2011-02-20 ENCOUNTER — Ambulatory Visit: Payer: Medicare Other | Admitting: Pulmonary Disease

## 2011-03-05 ENCOUNTER — Other Ambulatory Visit: Payer: Self-pay | Admitting: *Deleted

## 2011-03-05 ENCOUNTER — Other Ambulatory Visit: Payer: Self-pay | Admitting: Pulmonary Disease

## 2011-03-05 ENCOUNTER — Other Ambulatory Visit: Payer: Medicare Other

## 2011-03-05 ENCOUNTER — Other Ambulatory Visit (INDEPENDENT_AMBULATORY_CARE_PROVIDER_SITE_OTHER): Payer: Medicare Other

## 2011-03-05 DIAGNOSIS — R319 Hematuria, unspecified: Secondary | ICD-10-CM

## 2011-03-05 DIAGNOSIS — F411 Generalized anxiety disorder: Secondary | ICD-10-CM

## 2011-03-05 DIAGNOSIS — I10 Essential (primary) hypertension: Secondary | ICD-10-CM

## 2011-03-05 DIAGNOSIS — D126 Benign neoplasm of colon, unspecified: Secondary | ICD-10-CM

## 2011-03-05 DIAGNOSIS — E7800 Pure hypercholesterolemia, unspecified: Secondary | ICD-10-CM

## 2011-03-05 LAB — T3, FREE: T3, Free: 2.8 pg/mL (ref 2.3–4.2)

## 2011-03-05 LAB — LIPID PANEL
Cholesterol: 189 mg/dL (ref 0–200)
HDL: 39.8 mg/dL (ref >39.00)
LDL Cholesterol: 121 mg/dL — ABNORMAL HIGH (ref 0–99)
Total CHOL/HDL Ratio: 5
Triglycerides: 141 mg/dL (ref 0.0–149.0)
VLDL: 28.2 mg/dL (ref 0.0–40.0)

## 2011-03-05 LAB — T4, FREE: Free T4: 0.93 ng/dL (ref 0.60–1.60)

## 2011-03-05 LAB — CBC WITH DIFFERENTIAL/PLATELET
Basophils Absolute: 0 10*3/uL (ref 0.0–0.1)
Basophils Relative: 0.4 % (ref 0.0–3.0)
Eosinophils Absolute: 0.4 10*3/uL (ref 0.0–0.7)
Eosinophils Relative: 8.1 % — ABNORMAL HIGH (ref 0.0–5.0)
HCT: 37.2 % (ref 36.0–46.0)
Hemoglobin: 13 g/dL (ref 12.0–15.0)
Lymphocytes Relative: 14.6 % (ref 12.0–46.0)
Lymphs Abs: 0.6 10*3/uL — ABNORMAL LOW (ref 0.7–4.0)
MCHC: 35 g/dL (ref 30.0–36.0)
MCV: 93 fl (ref 78.0–100.0)
Monocytes Absolute: 0.4 10*3/uL (ref 0.1–1.0)
Monocytes Relative: 8.6 % (ref 3.0–12.0)
Neutro Abs: 3 10*3/uL (ref 1.4–7.7)
Neutrophils Relative %: 68.3 % (ref 43.0–77.0)
Platelets: 234 10*3/uL (ref 150.0–400.0)
RBC: 4 Mil/uL (ref 3.87–5.11)
RDW: 12.8 % (ref 11.5–14.6)
WBC: 4.4 10*3/uL — ABNORMAL LOW (ref 4.5–10.5)

## 2011-03-05 LAB — HEPATIC FUNCTION PANEL
ALT: 22 U/L (ref 0–35)
AST: 20 U/L (ref 0–37)
Albumin: 4.3 g/dL (ref 3.5–5.2)
Alkaline Phosphatase: 85 U/L (ref 39–117)
Bilirubin, Direct: 0.1 mg/dL (ref 0.0–0.3)
Total Bilirubin: 0.5 mg/dL (ref 0.3–1.2)
Total Protein: 6.9 g/dL (ref 6.0–8.3)

## 2011-03-05 LAB — BASIC METABOLIC PANEL
BUN: 14 mg/dL (ref 6–23)
CO2: 27 mEq/L (ref 19–32)
Calcium: 9 mg/dL (ref 8.4–10.5)
Chloride: 104 mEq/L (ref 96–112)
Creatinine, Ser: 0.7 mg/dL (ref 0.4–1.2)
GFR: 90.37 mL/min (ref >60.00)
Glucose, Bld: 92 mg/dL (ref 70–99)
Potassium: 3.5 mEq/L (ref 3.5–5.1)
Sodium: 140 mEq/L (ref 135–145)

## 2011-03-05 LAB — URINALYSIS, ROUTINE W REFLEX MICROSCOPIC
Bilirubin Urine: NEGATIVE
Hgb urine dipstick: NEGATIVE
Ketones, ur: NEGATIVE
Leukocytes, UA: NEGATIVE
Nitrite: NEGATIVE
Specific Gravity, Urine: 1.015 (ref 1.000–1.030)
Total Protein, Urine: NEGATIVE
Urine Glucose: NEGATIVE
Urobilinogen, UA: 0.2 (ref 0.0–1.0)
pH: 6 (ref 5.0–8.0)

## 2011-03-05 LAB — TSH: TSH: 2.28 u[IU]/mL (ref 0.35–5.50)

## 2011-03-05 LAB — IBC PANEL
Iron: 79 ug/dL (ref 42–145)
Saturation Ratios: 24 % (ref 20.0–50.0)
Transferrin: 235.6 mg/dL (ref 212.0–360.0)

## 2011-03-06 ENCOUNTER — Telehealth: Payer: Self-pay | Admitting: *Deleted

## 2011-03-06 NOTE — Telephone Encounter (Signed)
Per SN: LDL not as good as previous > need better low chol/low fat diet.  Chemistries normal.  LFT's normal.  CBC looks good.  Thyroid normal.  Iron is okay.  Pt has pending appt 7.17.12 @ 1600, this appt needs to be kept.  LMOM TCB x1.

## 2011-03-06 NOTE — Progress Notes (Signed)
Quick Note:  LMOM TCB > see 7.10.12 phone note, as this is SN's patient and he has results / recs for pt's labs. ______

## 2011-03-06 NOTE — Telephone Encounter (Signed)
Pt returned call.  Advised of lab results / recs as stated by SN.  Pt verbalized her understanding.  Will keep 7.17.12 appt with SN.  Pt requests a copy of her labs mailed to her home.    Labs mailed to verified address.

## 2011-03-06 NOTE — Telephone Encounter (Signed)
Message copied by Lowell Bouton on Tue Mar 06, 2011  1:48 PM ------      Message from: Julio Sicks      Created: Tue Mar 06, 2011  9:22 AM       Urine is ok ,       ? Rest of labs pending.       Upcoming CPX -to discuss at [facility]

## 2011-03-07 LAB — URINE CULTURE

## 2011-03-13 ENCOUNTER — Ambulatory Visit (INDEPENDENT_AMBULATORY_CARE_PROVIDER_SITE_OTHER): Payer: Medicare Other | Admitting: Pulmonary Disease

## 2011-03-13 ENCOUNTER — Encounter: Payer: Self-pay | Admitting: Pulmonary Disease

## 2011-03-13 DIAGNOSIS — D126 Benign neoplasm of colon, unspecified: Secondary | ICD-10-CM

## 2011-03-13 DIAGNOSIS — I10 Essential (primary) hypertension: Secondary | ICD-10-CM

## 2011-03-13 DIAGNOSIS — L659 Nonscarring hair loss, unspecified: Secondary | ICD-10-CM

## 2011-03-13 DIAGNOSIS — K219 Gastro-esophageal reflux disease without esophagitis: Secondary | ICD-10-CM

## 2011-03-13 DIAGNOSIS — C8589 Other specified types of non-Hodgkin lymphoma, extranodal and solid organ sites: Secondary | ICD-10-CM

## 2011-03-13 DIAGNOSIS — F411 Generalized anxiety disorder: Secondary | ICD-10-CM

## 2011-03-13 DIAGNOSIS — R51 Headache: Secondary | ICD-10-CM

## 2011-03-13 DIAGNOSIS — E7800 Pure hypercholesterolemia, unspecified: Secondary | ICD-10-CM

## 2011-03-13 DIAGNOSIS — I059 Rheumatic mitral valve disease, unspecified: Secondary | ICD-10-CM

## 2011-03-13 DIAGNOSIS — M899 Disorder of bone, unspecified: Secondary | ICD-10-CM

## 2011-03-13 DIAGNOSIS — J984 Other disorders of lung: Secondary | ICD-10-CM

## 2011-03-13 DIAGNOSIS — K5730 Diverticulosis of large intestine without perforation or abscess without bleeding: Secondary | ICD-10-CM

## 2011-03-13 MED ORDER — AMLODIPINE BESYLATE 10 MG PO TABS: 5.0000 mg | ORAL_TABLET | Freq: Every day | ORAL | Status: DC

## 2011-03-13 NOTE — Patient Instructions (Signed)
Today we updated your med list in EPIC...    We refilled your Norvasc per request...  We will arrange for an appt w/ the [facility] at [facility] [place] [facility] to work w/ your cholesterol meds...  Call for any questions...  Let's plan a follow up visit in about 4 months, sooner if needed for problems.Marland KitchenMarland Kitchen

## 2011-03-14 ENCOUNTER — Encounter: Payer: Self-pay | Admitting: Pulmonary Disease

## 2011-03-14 NOTE — Progress Notes (Signed)
Subjective:    Patient ID: Colleen White, female    DOB: 08/09/1946, 65 y.o.   MRN: [id number]  HPI  65 y/o WF here for a follow up visit... she has multiple medical problems as noted below...    ~  August 09, 2009:  she's had a good year- f/u [facility] GI dept w/ EGD & Colon 11/10 done & basically OK... she fell ~60mo ago & seen by [facility]- no fracure, just bruised & Rx Ibuprofen... BP is well controlled on meds; Chol doing satis on Lip20;  she would like a Vit D level checked today as well (nl @ 44)...  ~  October 20, 2010:   [name] noted some hematuria 12/11 & saw DrPeterson for eval> cysto was neg, & CT Abd done in his office revealed a 4cm mass at the root of the mesentery;  she chose to go to [facility] for eval of this mass & had ELap 09/26/10 by [facility] w/ gross total resection of the mass which appeared to be a Lymphoma;  she was referred to DrRichards in Oncology & pt provides me w/ extensive records from their visit to discuss staging & Rx> this info is reviewed w/ the pt & scanned into our EMR...    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    [name] indicates that she had another colonoscopy done at [facility] by DrGrimm & Crockett (DrMorgan has left) but she was inadeq sedated (it was very painful procedure) & she will not be returning to these doctors;  the colon revealed divertics & one 4mm polyp (hyperplastic on path);  she requests referral to [facility] to establish GI care...    Today she wants me to check her incision & bone marrow bx sites (both clean, no drainage or erythema, just bruising at [facility] sites over iliac area);  she wants another UA & C&S (pending) to be sure there is no infection present;  she wants FLP (looks good on Lip20 + fish Oil), BMet (WNL), Vit D level (41- rec continue 1000u  daily supplement), stool cards to be sure there is no hidden blood, & she requests referral to an oncologic nutritionist to discuss nutritional needs w/ her anticipated chemoRx.[name].  ~  April 19,2012:  [name] requested add-on appt to discuss her BP> she brings home record w/ BPs on the low side & had prev discussed this w/ DrWall & they decr her Norvasc from 10mg  to 5mg /d (taking 1/2 tab now);  Currently BPs are 120-140 sys but occas down to 100 & she will feel weak when the BP is that low;  We discussed the situation (esp in light of her upcoming XRT at [facility] for her Lymphoma)> rec to take her MicardisHCT in AM at 1/2 to 1 tab dose depending on the BP reading & how she feels; and take the Amlodipine 10mg tabs- 1/4 to 1/2 tab in the PM similarly depending on her BP reading & how she feels... She understands the rationale here & agrees w/ this plan.  ~  March 13, 2011:  41mo ROV & she has completed her XRT from [facility] and her recent PET-CT 6/12 was neg- no evid of disease (DrRichards note of 6/12 is reviewed in detail);  She is also followed at the [facility] w/ an "itchy, burning scalp telogen effluvium (  s/p new lymphoma dx)> she was given Remeron & Neurontin Rx... 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  She had blood work done 7/12 at her request & she received a copy in advance> she has mult questions regarding the findings> reviewed in detail w/ pt today: LDL=121, WBC=4.4, 8% eos, UA is clear... She had a BMD as well w/ normal TScores... She is asking about a sleep study but wants to wait on sched this.        Problem List:   LYMPHOMA (ICD-202.80) >> SEE ABOVE, followed by DrRichards, Oncology at [facility]... ~  4/12:  Pt informs me she has decided to proceed w/ XRT from DrMorris at [facility], they plan 24Gy total in 12 fractions;  Dr. Wendee Copp is her Oncologist... ~  7/12:  She has completed the XRT, and recent  PET-CT was neg, no resid disease evident; 6/12 note from DrRichards reviewed in detail...  GLAUCOMA (ICD-365.9) - on COMBIGAN drops Bid per Ophthalomology DrStoneburner for "ocular hypertension, glaucoma, & drusen"...  ALLERGIC RHINITIS (ICD-477.9) - on CLARITIN 10mg  Prn...  Hx of PULMONARY NODULE (ICD-518.89) - prev CTChest w/ sm bilat nodules & no change x yrs... first scan 2003 for chr cough/  followed by DrBurney x yrs &  last CT 12/09 showed no ch in two 6mm nodules-one RLL, one left interlobar fissure... ~  yearly CXR's have been stable, clear & WNL (nodules not seen on regular CXR). ~  recheck by DrBurney 12/09 w/ f/u CT Chest showing 2 subcentimeter nodules w/o change from old scans, no other signif abnormalities. ~  CXR 12/10 is clear, NAD, no nodules seen. ~  2011-2012> see extensive staging eval for her intra-abd lymphoma diagnosed 1/12 w/ work-up at [facility], & these tiny nodules have not changed.  HYPERTENSION (ICD-401.9) - on MICARDIS 80-12.5 daily, & NORVASC 10mg - taking 1/2 daily...   ~  2/12:  BP = 110/70, takes meds regularly & tolerates well... denies HA, fatigue, visual changes, palipit, dizziness, syncope, dyspnea, edema, etc...  ~  4/12:  SEE ABOVE> rec to take her MicardisHCT in AM at 1/2 to 1 tab dose depending on the BP reading & how she feels; and take the Amlodipine 10mg tabs- 1/4 to 1/2 tab in the PM similarly depending on her BP reading & how she feels... She understands the rationale here & agrees w/ this plan. ~  7/12:  BP= 114/70 on the Micardis/HCT one daily & Norvasc10mg - 1/2 tab daily...  CHEST PAIN, ACUTE (ICD-786.50) MITRAL VALVE PROLAPSE (ICD-424.0) - takes ASA 81mg /d... she has seen DrWall in 2005, and again 4/09 for atypical CP & MVP... he recommended increased exercise program for her...  ~  2DEcho 7/03 showed mild prolapse of the ant MV leaflet... ~  NuclearStressTest 2/05 was negative- no ischemia, no infarction, EF= 81%... ~  Baseline EKG shows NSR, WNL,  NAD...  HYPERCHOLESTEROLEMIA (ICD-272.0) - on FISH OIL 1000mg /d + diet rx... ~  FLP 12/08 on Lip20 showed TChol 164, TG 70, HDL 40, LDL 110 ~  FLP 12/09 on Lip20 showed TChol 170, TG 124, HDL 37, LDL 108 ~  FLP 12/10 on Lip20 showed TChol 156, TG 71, HDL 42, LDL 100 ~  FLP 2/12 on Lip20 showed TChol 104, TG 53, HDL 37, LDL 56 ~  6/12:  She decided on her own to stop the Lipitor... ~  FLP 7/12 (one month off Lip20) showed TChol  189, TG 141, HDL 40, LDL 121  GERD (ICD-530.81) - on ZANTAC 150mg /d... prev on Nexium daily but DrRichards at [facility] preferred H2Blocker long term. ~   EGD 11/05 by Sunday Corn showed sm HH, min gastritis (no esophagitis and no Barrett's)... ~  repeat EGD 11/10 by DrMorgan showed sm HH, mild gastritis... continue PPI.  DIVERTICULOSIS OF COLON (ICD-562.10) COLONIC POLYPS (ICD-211.3) Hx of HEMORRHOIDS (ICD-455.6) ~  colonoscopy 4/05 by Sunday Corn showed divertics, fixed tortuous segm in sigmoid...  ~  repeat colon 11/10 by DrMorgan showed mod divertics and narrowed sigm/ stricture, no polyps... rec> FIBER! ~  repeat colon 1/12 by DrGrimm [facility] showed divertics, mild angulation of the sigmoid region, one 4mm sessile polyp in desc colon (hyperplastic)... pt indicates to me that the procedure was very painful 7 she will not be returning to DrGrimm... pt requests to establish w/ [facility] for GI follow up.  FIBROCYSTIC BREAST DISEASE (ICD-610.1) - followed by DrStreck yearly due to +FamHx of breast cancer in mother... ~  Mammogram 9/11 w/ scat fibroglandular densities, no lesions seen, f/u 58yr suggested.  OSTEOPENIA (ICD-733.90) - on Calcium, Vitamins, Vit D 1000 u OTC supplement...  ~  BMD's at [facility] have been WNL.[name]. ~  labs 12/10 showed Vit D level = 44 ~  labs 2/12 showed Vit D level = 41  HEADACHE (ICD-784.0) - hx headache eval 1996 by [facility] et al... ~  she had a neg eval by DrWillis in 1999 for numbness in fingers and toes... ~  she went to the [facility] 9/11 w/  HA & CT Brain ordered by DrJGreene showed mild age related vol loss, NAD...  ANXIETY (ICD-300.00) - she takes ALPRAZOLAM 0.5mg - 1/2 to 1 tab Tid Prn...  ALOPECIA (ICD-704.00) - she had an evaluation from Dr. Aurea Graff at [facility] in [place] in 2002... he recommended Black Currant and Evening Primrose Oil for her hair health, along w/ Horse Chestnut for varicose veins and a DASH diet... she also did yoga, accupuncture, massage therapy, and "body talk" therapy... Dermatologists at [facility] recommended Rogaine therapy... ~  7/12:  She reports f/u at [facility] w/ dx of "itchy burning telogen effluvium" secondary to her lymphoma dx & they prescribed REMERON 15mg  Qhs & NEURONTIN 300mg /d...  Health Maintenance -  GYN= DrDickstein & pt states up-to-date... she saw DrKohut in 2008 for thyroid panel which was WNL.[name]. she had the Shingles vaccine 7/11.   Past Surgical History  Procedure Date  . S/p tah/bso   . S/p orif prox phalanx 02/2007    right little finger----Dr Merlyn Lot  . 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 Chauncey Mann, [facility]  . Abdominal hysterectomy 1996  . Eye surgery 2009    Cataract    Outpatient Encounter Prescriptions as of 03/13/2011  Medication Sig Dispense Refill  . amLODipine (NORVASC) 10 MG tablet Take 0.5 tablets (5 mg total) by mouth daily.  30 tablet  11  . aspirin 81 MG tablet Take 81 mg by mouth daily.        . brimonidine-timolol (COMBIGAN) 0.2-0.5 % ophthalmic solution Place 1 drop into both eyes every 12 (twelve) hours.        . cholecalciferol (VITAMIN D) 1000 UNITS tablet Take 1,000 Units by mouth daily.        [name] docusate sodium (COLACE) 100 MG capsule Take once daily       . esomeprazole (NEXIUM) 40 MG capsule Take 40 mg by mouth daily before breakfast.        .  fish oil-omega-3 fatty acids 1000 MG capsule Take 1 g by mouth daily.        [name] gabapentin (NEURONTIN) 300 MG capsule Take 300 mg by mouth daily before breakfast.     ==> per [facility]    . loratadine (CLARITIN) 10 MG tablet Take 10 mg by mouth daily. As needed       . MICARDIS HCT 80-12.5 MG per tablet TAKE 1 TABLET IN THE     MORNING.  30 each  11  . mirtazapine (REMERON) 15 MG tablet Take 15 mg by mouth at bedtime.    ==> per [facility]    . Multiple Minerals-Vitamins (CITRACAL PLUS) TABS 1 tablet by mouth two times daily      . Multiple Vitamins-Minerals (CENTRUM) tablet Take 1 tablet by mouth daily.        . Multiple Vitamins-Minerals (OCUVITE PRESERVISION PO) 2 by mouth daily       . psyllium (METAMUCIL) 58.6 % packet Take 1 packet by mouth daily.        [name] tretinoin (RETIN-A) 0.05 % cream Once daily       . ALPRAZolam (XANAX) 0.5 MG tablet 1/2 to 1 tab by mouth three times daily as needed for nerves  ==> on HOLD     . DISCONTD: atorvastatin (LIPITOR) 20 MG tablet Take 20 mg by mouth daily.    ==> pt stopped on her own      No Known Allergies   Review of Systems        The patient complains of dyspnea on exertion, constipation, abdominal pain, gas/bloating, arthritis, and anxiety.  The patient denies fever, chills, sweats, anorexia, fatigue, weakness, malaise, weight loss, sleep disorder, blurring, diplopia, eye irritation, eye discharge, vision loss, eye pain, photophobia, earache, ear discharge, tinnitus, decreased hearing, nasal congestion, nosebleeds, sore throat, hoarseness, chest pain, palpitations, syncope, orthopnea, PND, peripheral edema, cough, dyspnea at rest, excessive sputum, hemoptysis, wheezing, pleurisy, nausea, vomiting, diarrhea, change in bowel habits, melena, hematochezia, jaundice, indigestion/heartburn, dysphagia, odynophagia, dysuria, hematuria, urinary frequency, urinary hesitancy, nocturia, incontinence, back pain, joint pain, joint swelling, muscle cramps, muscle weakness, stiffness, sciatica, restless legs, leg pain at night, leg pain with exertion, rash, itching, dryness, suspicious lesions, paralysis, paresthesias, seizures,  tremors, vertigo, transient blindness, frequent falls, frequent headaches, difficulty walking, depression, memory loss, confusion, cold intolerance, heat intolerance, polydipsia, polyphagia, polyuria, unusual weight change, abnormal bruising, bleeding, enlarged lymph nodes, urticaria, allergic rash, hay fever, and recurrent infections.     Objective:   Physical Exam      WD, WN, 65 y/o WF in NAD... GENERAL:  Alert & oriented; pleasant & cooperative... HEENT:  [place]/AT, EOM-wnl, PERRLA, EACs-clear, TMs-wnl, NOSE-clear, THROAT-clear & wnl. NECK:  Supple w/ full ROM; no JVD; normal carotid impulses w/o bruits; no thyromegaly or nodules palpated; no lymphadenopathy. CHEST:  Clear to P & A; without wheezes/ rales/ or rhonchi. HEART:  Regular Rhythm; without murmurs/ rubs/ or gallops. ABDOMEN:  Soft & nontender; midline scar of ELap surg 1/12; normal bowel sounds; no organomegaly or masses detected. EXT: without deformities, mild arthritic changes; no varicose veins/ +venous insuffic/ no edema. NEURO:  CN's intact; motor testing normal; sensory testing normal; gait normal & balance OK. DERM:  No lesions noted; no rash, + alopecia...   Assessment & Plan:   HBP>  BP well controlled on her 2 drug regimen> rec to take her MicardisHCT in AM and take the Amlodipine 10mg tabs- 1/2 tab in the PM...  LYMPHOMA>  Managed by  DrRichards at [facility];  S/p surg & XRT> 6/12 note reviewed in detail- PET was neg, doing well, f/u 36mo planned...  Hx 2 pulm nodules>  No change x yrs on XRays/Scans; as noted PET-CT was neg as well, no changes in these benign nodules...  CHOL>  She stopped her Lipitor & f/u FLP w/ LDL 121; we discussed diet Rx but she feels that she will NOT be able to control this w/ diet alone, therefore rec refer to Lipid Clinic for their help...  GI> GERD, Divrtics, Hx Polyps, Hx Hems>  Follow[facility] by [facility]Grimm at [facility]...  ?Hx Osteopenia>  She has f/u BMD 6/12 that was wnl, normal TScores...  Anxiety>   She has Alpraz for prn use  Alopecia>  Followed by Delane Ginger now on Remeron & Neurontin for her itchy burning scalp related to a telogen effluvium...  Other medical problems as listed above.Marland KitchenMarland KitchenMarland Kitchen

## 2011-03-19 ENCOUNTER — Encounter: Payer: Medicare Other | Admitting: *Deleted

## 2011-03-22 DIAGNOSIS — R197 Diarrhea, unspecified: Secondary | ICD-10-CM | POA: Insufficient documentation

## 2011-03-26 ENCOUNTER — Encounter: Payer: Medicare Other | Admitting: *Deleted

## 2011-03-28 ENCOUNTER — Other Ambulatory Visit (INDEPENDENT_AMBULATORY_CARE_PROVIDER_SITE_OTHER): Payer: Self-pay | Admitting: Surgery

## 2011-03-28 DIAGNOSIS — Z1231 Encounter for screening mammogram for malignant neoplasm of breast: Secondary | ICD-10-CM

## 2011-04-02 ENCOUNTER — Ambulatory Visit (INDEPENDENT_AMBULATORY_CARE_PROVIDER_SITE_OTHER): Payer: Medicare Other

## 2011-04-02 ENCOUNTER — Ambulatory Visit: Payer: Medicare Other

## 2011-04-02 VITALS — BP 121/80 | HR 60 | Wt 160.2 lb

## 2011-04-02 DIAGNOSIS — E7800 Pure hypercholesterolemia, unspecified: Secondary | ICD-10-CM

## 2011-04-02 MED ORDER — PRAVASTATIN SODIUM 20 MG PO TABS: 20.0000 mg | ORAL_TABLET | Freq: Every evening | ORAL | Status: DC

## 2011-04-02 NOTE — Patient Instructions (Addendum)
Start pravastatin 20mg  once daily at bedtime.   Try to start exercising at the gym 2-3 days a week  Follow the diet suggestions we gave you today.   Recheck labs in 1 month.

## 2011-04-09 ENCOUNTER — Ambulatory Visit: Payer: Medicare Other

## 2011-04-16 NOTE — Assessment & Plan Note (Signed)
Pt's cholesterol did increase with no statin.  TC- 189 (goal<200), TG- 141 (goal<150), HDL- 39.8 (goal>45), and LDL 121 (goal<100).  AST and ALT are WNL.  Pt would prefer drug therapy rather than lifestyle changes alone.  Will retry statin.  Will try pravastatin at slightly lower equivalent dose to Lipitor given generic availability and lower risk of side effects.  Pt encouraged to make small changes in her diet such as more oatmeal for breakfast and high fiber vegetables.  Also asked her to start working out at the gym on a regular basis again.  Will f/u in 1 month.

## 2011-04-16 NOTE — Progress Notes (Signed)
HPI Colleen White is a 65 yo F who presents to lipid clinic for initial consult.  She was referred by her PCP for increase in her cholesterol after stopping Lipitor.  She is followed at [facility] by oncology for lymphoma.  In June, she had increased AST and ALT.  Lipitor was stopped and now LFTs are back to baseline.  Pertinent PMH includes HTN and FH includes parents with hyperlipidemia.  She currently only takes fish oil.  Her cholesterol was previously well controlled with Lipitor.   Pt's diet fairly well controlled.  She only eats breakfast a few days of the week.  She will have a biscuit, peanut butter crackers, cereal with fruit or occasionally oatmeal.  She likes to drink unsweet tea.  Lunch is a salad with blue cheese or thousand island dressing or sandwich.  She also likes vegetables such as green beans or binto beans.  Dinner is usually more vegetables and fruit more than meat.  If she does it meat, it is usually chicken.  She does admit to liking sweets, mainly ice cream.   Pt likes to exercise but has not done so since a surgery in January.  She tries to walk around the neighborhood or get on the treadmill a few days of the week.

## 2011-04-17 ENCOUNTER — Other Ambulatory Visit (INDEPENDENT_AMBULATORY_CARE_PROVIDER_SITE_OTHER): Payer: Self-pay | Admitting: Surgery

## 2011-04-17 DIAGNOSIS — Z803 Family history of malignant neoplasm of breast: Secondary | ICD-10-CM

## 2011-05-14 ENCOUNTER — Other Ambulatory Visit (INDEPENDENT_AMBULATORY_CARE_PROVIDER_SITE_OTHER): Payer: Medicare Other

## 2011-05-14 DIAGNOSIS — E7800 Pure hypercholesterolemia, unspecified: Secondary | ICD-10-CM

## 2011-05-14 LAB — HEPATIC FUNCTION PANEL
ALT: 20 U/L (ref 0–35)
AST: 22 U/L (ref 0–37)
Albumin: 4.1 g/dL (ref 3.5–5.2)
Alkaline Phosphatase: 81 U/L (ref 39–117)
Bilirubin, Direct: 0.1 mg/dL (ref 0.0–0.3)
Total Bilirubin: 0.7 mg/dL (ref 0.3–1.2)
Total Protein: 6.8 g/dL (ref 6.0–8.3)

## 2011-05-14 LAB — LIPID PANEL
Cholesterol: 162 mg/dL (ref 0–200)
HDL: 37.5 mg/dL — ABNORMAL LOW (ref >39.00)
LDL Cholesterol: 98 mg/dL (ref 0–99)
Total CHOL/HDL Ratio: 4
Triglycerides: 133 mg/dL (ref 0.0–149.0)
VLDL: 26.6 mg/dL (ref 0.0–40.0)

## 2011-05-15 ENCOUNTER — Ambulatory Visit
Admission: RE | Admit: 2011-05-15 | Discharge: 2011-05-15 | Disposition: A | Discharge status: Home / Self Care | Payer: Medicare Other | Source: Ambulatory Visit | Attending: Surgery | Admitting: Surgery

## 2011-05-15 ENCOUNTER — Ambulatory Visit: Payer: Medicare Other

## 2011-05-15 DIAGNOSIS — Z1231 Encounter for screening mammogram for malignant neoplasm of breast: Secondary | ICD-10-CM

## 2011-05-17 ENCOUNTER — Encounter (INDEPENDENT_AMBULATORY_CARE_PROVIDER_SITE_OTHER): Payer: Self-pay | Admitting: General Surgery

## 2011-05-17 ENCOUNTER — Ambulatory Visit
Admission: RE | Admit: 2011-05-17 | Discharge: 2011-05-17 | Disposition: A | Discharge status: Home / Self Care | Payer: Medicare Other | Source: Ambulatory Visit | Attending: Surgery | Admitting: Surgery

## 2011-05-17 DIAGNOSIS — Z803 Family history of malignant neoplasm of breast: Secondary | ICD-10-CM

## 2011-05-17 MED ORDER — GADOBENATE DIMEGLUMINE 529 MG/ML IV SOLN
14.0000 mL | Freq: Once | INTRAVENOUS | Status: AC | PRN
  Administered 2011-05-17: 14 mL via INTRAVENOUS

## 2011-05-18 ENCOUNTER — Encounter (INDEPENDENT_AMBULATORY_CARE_PROVIDER_SITE_OTHER): Payer: Self-pay | Admitting: Surgery

## 2011-05-18 ENCOUNTER — Ambulatory Visit (INDEPENDENT_AMBULATORY_CARE_PROVIDER_SITE_OTHER): Payer: Self-pay | Admitting: Surgery

## 2011-05-18 ENCOUNTER — Ambulatory Visit (INDEPENDENT_AMBULATORY_CARE_PROVIDER_SITE_OTHER): Payer: Medicare Other | Admitting: Surgery

## 2011-05-18 VITALS — BP 110/76 | HR 60 | Temp 97.4°F | Resp 12 | Ht 63.0 in | Wt 162.0 lb

## 2011-05-18 DIAGNOSIS — N6019 Diffuse cystic mastopathy of unspecified breast: Secondary | ICD-10-CM

## 2011-05-18 NOTE — Progress Notes (Signed)
  CC: High-risk breast followup HPI: This patient comes in for annual breast checks. She continues to have intermittent upper outer quadrant left breast pain. This is been present for years. She has had no changes in those symptoms.   ROS: There have been no changes since I last saw her. Of note however is that she was diagnosed with an abdominal lymphoma and had surgery several months ago. She believes she has made a complete recovery.    PE General: The patient is alert, oriented, and healthy-appearing. Breasts: The breasts are symmetric. There are no changes from her last visit. She continues to have mild tenderness. There is no dominant mass although they are slightly irregular. The skin, nipple, and areolar areas are normal. Lymphatics: There is no axillary or supraclavicular adenopathy  Data Reviewed She recently had both a mammogram and a breast MRI. Those are both negative.  Assessment Stable exam with no evidence of breast cancer in a patient with a strong family history of breast cancer  Plan We will continue to have her do annual followups here.

## 2011-05-21 ENCOUNTER — Ambulatory Visit: Payer: Medicare Other

## 2011-05-24 ENCOUNTER — Ambulatory Visit (INDEPENDENT_AMBULATORY_CARE_PROVIDER_SITE_OTHER): Payer: Medicare Other

## 2011-05-24 VITALS — Wt 162.0 lb

## 2011-05-24 DIAGNOSIS — E7800 Pure hypercholesterolemia, unspecified: Secondary | ICD-10-CM

## 2011-05-24 NOTE — Assessment & Plan Note (Signed)
Pt's cholesterol greatly improved with addition of pravastatin.  TC- 162 (goal<200), TG-133 (goal<150), HDL- 37 (goal>45) and LDL- 98 (goal<100).  LFTs are WNL.  Will continue pravastatin and fish oil.  Pt motivated to start exercise routine to help increase HDL.  Will f/u in 3 months.  If LFTs remain stable, will have her f/u with Dr. Kriste Basque for future management.

## 2011-05-24 NOTE — Patient Instructions (Signed)
Continue current medications.   Set an exercise goal of 20-30 minutes of walking 2-3 days per week   We will recheck your labs in 3 months.

## 2011-05-24 NOTE — Progress Notes (Signed)
HPI Mrs. Colleen White is a 64 yo F who presents to lipid clinic for follow up.  She was first seen in July for an increase in cholesterol after stopping Lipitor due to elevated LFTs.  Once off Lipitor, LFTs returned to baseline.  At last visit, she was started on pravastatin 20mg  daily.  She has not had any problems and does not report any muscle pains or cramps.  Her only complaint today is feeling tired, but she is on mirtazapine to help with alopecia.     Pt's diet fairly well controlled.  She only eats breakfast a few days of the week.  She will have a biscuit, peanut butter crackers, cereal with fruit or occasionally oatmeal.  She likes to drink unsweet tea.  Lunch is a salad with blue cheese or thousand island dressing or sandwich.  She also likes vegetables such as green beans or binto beans.  Dinner is usually more vegetables and fruit more than meat.  If she does it meat, it is usually chicken.  She does admit to liking sweets, mainly ice cream.   Pt has not exercised since last visit.  She likes to walk at the gym and has started to do this some in the past few weeks but not consistently.  She will walk around the block with her husband at night.  She has set a goal for herself of walking at the gym 2-3 days per week.   Current Outpatient Prescriptions on File Prior to Visit  Medication Sig Dispense Refill  . Minoxidil 5 % FOAM Apply 3-5 mLs topically at bedtime. Apply to Scalp for hair loss at bedtime       . mirtazapine (REMERON) 15 MG tablet Take 7.5 mg by mouth at bedtime.       [name] amLODipine (NORVASC) 10 MG tablet Take 0.5 tablets (5 mg total) by mouth daily.  30 tablet  11  . aspirin 81 MG tablet Take 81 mg by mouth daily.        . brimonidine-timolol (COMBIGAN) 0.2-0.5 % ophthalmic solution Place 1 drop into both eyes every 12 (twelve) hours.        . cholecalciferol (VITAMIN D) 1000 UNITS tablet Take 1,000 Units by mouth daily.        [name] docusate sodium (COLACE) 100 MG capsule Take once  daily       . esomeprazole (NEXIUM) 40 MG capsule Take 40 mg by mouth daily before breakfast.        . fish oil-omega-3 fatty acids 1000 MG capsule Take 1 g by mouth daily.        [name] loratadine (CLARITIN) 10 MG tablet Take 10 mg by mouth daily. As needed       . MICARDIS HCT 80-12.5 MG per tablet TAKE 1 TABLET IN THE     MORNING.  30 each  11  . Multiple Minerals-Vitamins (CITRACAL PLUS) TABS 1 tablet by mouth two times daily      . Multiple Vitamins-Minerals (CENTRUM) tablet Take 1 tablet by mouth daily.        . Multiple Vitamins-Minerals (OCUVITE PRESERVISION PO) 2 by mouth daily       . pravastatin (PRAVACHOL) 20 MG tablet Take 1 tablet (20 mg total) by mouth every evening.  30 tablet  6  . tretinoin (RETIN-A) 0.05 % cream Once daily         No Known Allergies

## 2011-05-28 ENCOUNTER — Other Ambulatory Visit: Payer: Self-pay | Admitting: Pulmonary Disease

## 2011-06-11 LAB — BASIC METABOLIC PANEL
BUN: 12
CO2: 32
Calcium: 10.2
Chloride: 103
Creatinine, Ser: 0.65
GFR calc Af Amer: >60
GFR calc non Af Amer: >60
Glucose, Bld: 102 — ABNORMAL HIGH
Potassium: 4.5
Sodium: 142

## 2011-06-11 LAB — POCT HEMOGLOBIN-HEMACUE
Hemoglobin: 15.8 — ABNORMAL HIGH
Operator id: 12362

## 2011-06-13 ENCOUNTER — Telehealth: Payer: Self-pay | Admitting: Pulmonary Disease

## 2011-06-13 NOTE — Telephone Encounter (Signed)
Spoke with pt. She states that her oncologist had rec that she have her TSH checked. Not currently having any issues per pt, just needed to have this checked b/c she had told her doc was unsure if we had checked this recently. I advised we have checked her TSH in July 2012. She states would like a copy mailed to her home address and will give copy to her oncologist at next appt. Mailing address was verified and TSH mailed to the pt.

## 2011-06-13 NOTE — Telephone Encounter (Signed)
LMOMTCB x 1 

## 2011-08-30 ENCOUNTER — Telehealth: Payer: Self-pay | Admitting: Pulmonary Disease

## 2011-08-30 DIAGNOSIS — J984 Other disorders of lung: Secondary | ICD-10-CM

## 2011-08-30 DIAGNOSIS — I10 Essential (primary) hypertension: Secondary | ICD-10-CM

## 2011-08-30 DIAGNOSIS — K5730 Diverticulosis of large intestine without perforation or abscess without bleeding: Secondary | ICD-10-CM

## 2011-08-30 DIAGNOSIS — F411 Generalized anxiety disorder: Secondary | ICD-10-CM

## 2011-08-30 NOTE — Telephone Encounter (Signed)
I spoke with pt and she is requesting to have lab work done prior to her apt w/ a cxr. Pt states she has to have lab work done at the lipid clinic on 09/10/11. Pt states she is wanting to have her liver function done as well. Please advise Dr. [name], thanks

## 2011-08-31 NOTE — Telephone Encounter (Signed)
Pt is aware but has decided to wait until after she sees SN on the 31st to have labs drawn. She wants to make sure there are no repeat tests since she is having labs drawn at the lipid clinic on the 14th. She will come fasting the day of her physical.

## 2011-08-31 NOTE — Telephone Encounter (Signed)
Per SN--ok for her to have labs done and cxr.  These orders have been placed in the computer for her.  thanks

## 2011-09-05 ENCOUNTER — Encounter: Payer: Self-pay | Admitting: Pulmonary Disease

## 2011-09-05 ENCOUNTER — Ambulatory Visit (INDEPENDENT_AMBULATORY_CARE_PROVIDER_SITE_OTHER): Payer: Medicare Other | Admitting: Pulmonary Disease

## 2011-09-05 VITALS — BP 122/72 | HR 87 | Temp 97.3°F | Ht 63.0 in | Wt 167.8 lb

## 2011-09-05 DIAGNOSIS — M549 Dorsalgia, unspecified: Secondary | ICD-10-CM | POA: Diagnosis not present

## 2011-09-05 DIAGNOSIS — C8589 Other specified types of non-Hodgkin lymphoma, extranodal and solid organ sites: Secondary | ICD-10-CM | POA: Diagnosis not present

## 2011-09-05 DIAGNOSIS — Z23 Encounter for immunization: Secondary | ICD-10-CM

## 2011-09-05 DIAGNOSIS — I10 Essential (primary) hypertension: Secondary | ICD-10-CM | POA: Diagnosis not present

## 2011-09-05 DIAGNOSIS — E7800 Pure hypercholesterolemia, unspecified: Secondary | ICD-10-CM

## 2011-09-05 DIAGNOSIS — I059 Rheumatic mitral valve disease, unspecified: Secondary | ICD-10-CM

## 2011-09-05 DIAGNOSIS — J984 Other disorders of lung: Secondary | ICD-10-CM

## 2011-09-05 DIAGNOSIS — F411 Generalized anxiety disorder: Secondary | ICD-10-CM

## 2011-09-05 DIAGNOSIS — N6019 Diffuse cystic mastopathy of unspecified breast: Secondary | ICD-10-CM

## 2011-09-05 MED ORDER — CYCLOBENZAPRINE HCL 10 MG PO TABS: 10.0000 mg | ORAL_TABLET | Freq: Three times a day (TID) | ORAL | Status: DC | PRN

## 2011-09-05 NOTE — Patient Instructions (Signed)
Today we updated your med list in our EPIC system...    Continue your current medications the same...  For your back discomfort>>    Rest;  Heat vs Ice;  Icey hot patches, etc...    Use Ibuprofen up to 4 times daily...    We wrote a new prescription for FLEXERIL muscle relaxer 10mg  up to 3 times daily...  Try the "physical therapy" back exercises & work on strengthening your abs...  If the back symptoms persist we can refer you to the Orthopedists for further eval (XRays, scans, etc)...  Call for any questions.Marland KitchenMarland Kitchen

## 2011-09-06 DIAGNOSIS — R229 Localized swelling, mass and lump, unspecified: Secondary | ICD-10-CM | POA: Diagnosis not present

## 2011-09-06 DIAGNOSIS — D239 Other benign neoplasm of skin, unspecified: Secondary | ICD-10-CM | POA: Diagnosis not present

## 2011-09-10 ENCOUNTER — Other Ambulatory Visit (INDEPENDENT_AMBULATORY_CARE_PROVIDER_SITE_OTHER): Payer: Medicare Other

## 2011-09-10 DIAGNOSIS — F411 Generalized anxiety disorder: Secondary | ICD-10-CM

## 2011-09-10 DIAGNOSIS — E7800 Pure hypercholesterolemia, unspecified: Secondary | ICD-10-CM | POA: Diagnosis not present

## 2011-09-10 DIAGNOSIS — K5730 Diverticulosis of large intestine without perforation or abscess without bleeding: Secondary | ICD-10-CM

## 2011-09-10 DIAGNOSIS — I10 Essential (primary) hypertension: Secondary | ICD-10-CM | POA: Diagnosis not present

## 2011-09-10 LAB — BASIC METABOLIC PANEL
BUN: 15 mg/dL (ref 6–23)
CO2: 31 mEq/L (ref 19–32)
Calcium: 9.1 mg/dL (ref 8.4–10.5)
Chloride: 99 mEq/L (ref 96–112)
Creatinine, Ser: 0.7 mg/dL (ref 0.4–1.2)
GFR: 87.3 mL/min (ref >60.00)
Glucose, Bld: 93 mg/dL (ref 70–99)
Potassium: 3.8 mEq/L (ref 3.5–5.1)
Sodium: 135 mEq/L (ref 135–145)

## 2011-09-10 LAB — CBC WITH DIFFERENTIAL/PLATELET
Basophils Absolute: 0 10*3/uL (ref 0.0–0.1)
Basophils Relative: 0.4 % (ref 0.0–3.0)
Eosinophils Absolute: 0.2 10*3/uL (ref 0.0–0.7)
Eosinophils Relative: 5.2 % — ABNORMAL HIGH (ref 0.0–5.0)
HCT: 36.7 % (ref 36.0–46.0)
Hemoglobin: 12.9 g/dL (ref 12.0–15.0)
Lymphocytes Relative: 17.8 % (ref 12.0–46.0)
Lymphs Abs: 0.8 10*3/uL (ref 0.7–4.0)
MCHC: 35.1 g/dL (ref 30.0–36.0)
MCV: 91.6 fl (ref 78.0–100.0)
Monocytes Absolute: 0.4 10*3/uL (ref 0.1–1.0)
Monocytes Relative: 8.1 % (ref 3.0–12.0)
Neutro Abs: 3.1 10*3/uL (ref 1.4–7.7)
Neutrophils Relative %: 68.5 % (ref 43.0–77.0)
Platelets: 219 10*3/uL (ref 150.0–400.0)
RBC: 4.01 Mil/uL (ref 3.87–5.11)
RDW: 12.7 % (ref 11.5–14.6)
WBC: 4.5 10*3/uL (ref 4.5–10.5)

## 2011-09-10 LAB — HEPATIC FUNCTION PANEL
ALT: 25 U/L (ref 0–35)
AST: 23 U/L (ref 0–37)
Albumin: 4 g/dL (ref 3.5–5.2)
Alkaline Phosphatase: 80 U/L (ref 39–117)
Bilirubin, Direct: 0.1 mg/dL (ref 0.0–0.3)
Total Bilirubin: 0.7 mg/dL (ref 0.3–1.2)
Total Protein: 6.8 g/dL (ref 6.0–8.3)

## 2011-09-10 LAB — LIPID PANEL
Cholesterol: 151 mg/dL (ref 0–200)
HDL: 36.1 mg/dL — ABNORMAL LOW (ref >39.00)
LDL Cholesterol: 90 mg/dL (ref 0–99)
Total CHOL/HDL Ratio: 4
Triglycerides: 127 mg/dL (ref 0.0–149.0)
VLDL: 25.4 mg/dL (ref 0.0–40.0)

## 2011-09-10 LAB — TSH: TSH: 1.6 u[IU]/mL (ref 0.35–5.50)

## 2011-09-11 ENCOUNTER — Other Ambulatory Visit: Payer: Self-pay | Admitting: Pulmonary Disease

## 2011-09-11 ENCOUNTER — Ambulatory Visit: Payer: Medicare Other

## 2011-09-11 DIAGNOSIS — M5410 Radiculopathy, site unspecified: Secondary | ICD-10-CM | POA: Insufficient documentation

## 2011-09-13 ENCOUNTER — Ambulatory Visit: Payer: Medicare Other

## 2011-09-13 ENCOUNTER — Telehealth: Payer: Self-pay | Admitting: Pulmonary Disease

## 2011-09-13 ENCOUNTER — Other Ambulatory Visit: Payer: Medicare Other

## 2011-09-13 DIAGNOSIS — Z1289 Encounter for screening for malignant neoplasm of other sites: Secondary | ICD-10-CM

## 2011-09-13 LAB — HEMOCCULT SLIDES (X 3 CARDS)
Fecal Occult Blood: NEGATIVE
OCCULT 1: NEGATIVE
OCCULT 2: NEGATIVE
OCCULT 3: NEGATIVE
OCCULT 4: NEGATIVE
OCCULT 5: NEGATIVE

## 2011-09-13 NOTE — Telephone Encounter (Signed)
Pt notified Referral was sent to Dr Krista Blue with records.  Still waiting to hear back.  Pt is going to call Dr Rhea Belton office to check status of appt.

## 2011-09-17 ENCOUNTER — Ambulatory Visit (INDEPENDENT_AMBULATORY_CARE_PROVIDER_SITE_OTHER): Payer: Medicare Other | Admitting: Pharmacist

## 2011-09-17 VITALS — BP 120/76 | Wt 165.0 lb

## 2011-09-17 DIAGNOSIS — I10 Essential (primary) hypertension: Secondary | ICD-10-CM | POA: Diagnosis not present

## 2011-09-17 DIAGNOSIS — E7800 Pure hypercholesterolemia, unspecified: Secondary | ICD-10-CM

## 2011-09-17 DIAGNOSIS — F411 Generalized anxiety disorder: Secondary | ICD-10-CM | POA: Diagnosis not present

## 2011-09-17 DIAGNOSIS — E785 Hyperlipidemia, unspecified: Secondary | ICD-10-CM | POA: Diagnosis not present

## 2011-09-17 DIAGNOSIS — M545 Low back pain, unspecified: Secondary | ICD-10-CM | POA: Diagnosis not present

## 2011-09-17 NOTE — Progress Notes (Signed)
HPI:  BB is a 61 yoWF presenting for dyslipidemia follow up.  She is currently treated with pravastatin 20 mg PO every evening and fish oil 1 g, which she is only taking about twice weekly.  She is compliant with pravastatin and denies any muscle pains or weaknesses with pravastatin.  Her diet is fairly well controlled, however she does splurge often and went on a long vacation in [place] where she feels like she ate more than usual.  She is motivated to get back on a consistently healthy diet.  While on vacation, she was very diligent about walking in the gym 2-3 miles per day with a friend.  However, when she returned, her back started hurting, for which she is seeing an MD today about.  She is also motivated to get back into routine exercise to help bring up her HDL.  Current Outpatient Prescriptions  Medication Sig Dispense Refill  . amLODipine (NORVASC) 10 MG tablet Take 0.5 tablets (5 mg total) by mouth daily.  30 tablet  11  . aspirin 81 MG tablet Take 81 mg by mouth daily.        . brimonidine-timolol (COMBIGAN) 0.2-0.5 % ophthalmic solution Place 1 drop into both eyes every 12 (twelve) hours.        . cholecalciferol (VITAMIN D) 1000 UNITS tablet Take 1,000 Units by mouth daily.        . cyclobenzaprine (FLEXERIL) 10 MG tablet Take 1 tablet (10 mg total) by mouth 3 (three) times daily as needed for muscle spasms.  50 tablet  0  . docusate sodium (COLACE) 100 MG capsule Take once daily       . esomeprazole (NEXIUM) 40 MG capsule       . fish oil-omega-3 fatty acids 1000 MG capsule Take 1 g by mouth daily as needed.       [name] ibuprofen (ADVIL,MOTRIN) 200 MG tablet Take 600 mg by mouth as needed.      . loratadine (CLARITIN) 10 MG tablet Take 10 mg by mouth daily. As needed       . MICARDIS HCT 80-12.5 MG per tablet TAKE 1 TABLET IN THE     MORNING.  30 each  11  . Minoxidil 5 % FOAM Apply 3-5 mLs topically at bedtime. Apply to Scalp for hair loss at bedtime       . mirtazapine (REMERON) 15 MG  tablet Take 7.5 mg by mouth at bedtime.       . Multiple Minerals-Vitamins (CITRACAL PLUS) TABS 1 tablet by mouth two times daily      . Multiple Vitamins-Minerals (CENTRUM) tablet Take 1 tablet by mouth daily.        . Multiple Vitamins-Minerals (OCUVITE PRESERVISION PO) 2 by mouth daily       . pravastatin (PRAVACHOL) 20 MG tablet Take 1 tablet (20 mg total) by mouth every evening.  30 tablet  6  . tretinoin (RETIN-A) 0.05 % cream Once daily        No Known Allergies

## 2011-09-17 NOTE — Assessment & Plan Note (Signed)
Current lipid panel (09/10/11):  TC 151 (goal <200), TG 127 (goal <150), HDL 36.1 (goal >45), LDL 90 (goal <100).  LFTs are WNL.  TC, TG, and LDL have all improved since last lipid panel, however, HDL has decreased slightly, most likely due to a decrease in exercise.  LFTs and lipid levels remain stable, and patient can now be followed by Dr. Kriste Basque for future management.  Plan: 1)  Continue pravastatin 20 mg PO every evening 2)  Continue fish oil 1 capsule, and try to increase weekly frequency 3)  Continue healthy dietary choices 4)  Start slowly exercising regularly to increase HDL when back pain is resolved.  Work up to a goal of walking 20-30 minutes 2-3 days per week. 5)  Dr. Kriste Basque to follow.

## 2011-09-25 DIAGNOSIS — K5900 Constipation, unspecified: Secondary | ICD-10-CM | POA: Diagnosis not present

## 2011-09-25 DIAGNOSIS — C8299 Follicular lymphoma, unspecified, extranodal and solid organ sites: Secondary | ICD-10-CM | POA: Diagnosis not present

## 2011-09-25 DIAGNOSIS — I10 Essential (primary) hypertension: Secondary | ICD-10-CM | POA: Diagnosis not present

## 2011-09-25 DIAGNOSIS — R911 Solitary pulmonary nodule: Secondary | ICD-10-CM | POA: Diagnosis not present

## 2011-09-25 DIAGNOSIS — D649 Anemia, unspecified: Secondary | ICD-10-CM | POA: Diagnosis not present

## 2011-09-25 DIAGNOSIS — E7800 Pure hypercholesterolemia, unspecified: Secondary | ICD-10-CM | POA: Diagnosis not present

## 2011-09-25 DIAGNOSIS — K219 Gastro-esophageal reflux disease without esophagitis: Secondary | ICD-10-CM | POA: Diagnosis not present

## 2011-09-25 DIAGNOSIS — F411 Generalized anxiety disorder: Secondary | ICD-10-CM | POA: Diagnosis not present

## 2011-09-27 ENCOUNTER — Encounter: Payer: Self-pay | Admitting: Pulmonary Disease

## 2011-09-27 ENCOUNTER — Ambulatory Visit (INDEPENDENT_AMBULATORY_CARE_PROVIDER_SITE_OTHER): Payer: Medicare Other | Admitting: Pulmonary Disease

## 2011-09-27 DIAGNOSIS — K5730 Diverticulosis of large intestine without perforation or abscess without bleeding: Secondary | ICD-10-CM

## 2011-09-27 DIAGNOSIS — K219 Gastro-esophageal reflux disease without esophagitis: Secondary | ICD-10-CM

## 2011-09-27 DIAGNOSIS — I10 Essential (primary) hypertension: Secondary | ICD-10-CM | POA: Diagnosis not present

## 2011-09-27 DIAGNOSIS — D126 Benign neoplasm of colon, unspecified: Secondary | ICD-10-CM

## 2011-09-27 DIAGNOSIS — F411 Generalized anxiety disorder: Secondary | ICD-10-CM

## 2011-09-27 DIAGNOSIS — M899 Disorder of bone, unspecified: Secondary | ICD-10-CM

## 2011-09-27 DIAGNOSIS — R51 Headache: Secondary | ICD-10-CM

## 2011-09-27 DIAGNOSIS — I059 Rheumatic mitral valve disease, unspecified: Secondary | ICD-10-CM | POA: Diagnosis not present

## 2011-09-27 DIAGNOSIS — C8589 Other specified types of non-Hodgkin lymphoma, extranodal and solid organ sites: Secondary | ICD-10-CM | POA: Diagnosis not present

## 2011-09-27 DIAGNOSIS — J984 Other disorders of lung: Secondary | ICD-10-CM | POA: Diagnosis not present

## 2011-09-27 DIAGNOSIS — E7800 Pure hypercholesterolemia, unspecified: Secondary | ICD-10-CM

## 2011-09-27 NOTE — Progress Notes (Deleted)
Subjective:     Patient ID: [facility], female   DOB: 1946/08/11, 66 y.o.   MRN: [id number]  HPI   Review of Systems     Objective:   Physical Exam     Assessment:     ***    Plan:     ***

## 2011-09-27 NOTE — Patient Instructions (Signed)
Today we updated your med list in our EPIC system...    Continue your current medications the same...  Your recent blood work & PET scan at [facility] looked great!  Call for any problems or if we can be of service in any way.Marland KitchenMarland Kitchen

## 2011-09-28 ENCOUNTER — Encounter: Payer: Self-pay | Admitting: Pulmonary Disease

## 2011-09-28 NOTE — Progress Notes (Signed)
Subjective:    Patient ID: [facility], female    DOB: 03-17-1946, 66 y.o.   MRN: [id number]  HPI 66 y/o WF here for a follow up visit... she has multiple medical problems as noted below...    ~  August 09, 2009:  she's had a good year- f/u [facility] w/ EGD & Colon 11/10 done & basically OK... she fell ~68mo ago & seen by [facility]- no fracure, just bruised & Rx Ibuprofen... BP is well controlled on meds; Chol doing satis on Lip20;  she would like a Vit D level checked today as well (nl @ 44)...  ~  October 20, 2010:   [name] noted some hematuria 12/11 & saw DrPeterson for eval> cysto was neg, & CT Abd done in his office revealed a 4cm mass at the root of the mesentery;  she chose to go to [facility] for eval of this mass & had ELap 09/26/10 by [facility] w/ gross total resection of the mass which appeared to be a Lymphoma;  she was referred to DrRichards in Oncology & pt provides me w/ extensive records from their visit to discuss staging & Rx> this info is reviewed w/ the pt & scanned into our EMR...    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    [name] indicates that she had another colonoscopy done at [facility] by DrGrimm & Crockett (DrMorgan has left) but she was inadeq sedated (it was very painful procedure) & she will not be returning to these doctors;  the colon revealed divertics & one 4mm polyp (hyperplastic on path);  she requests referral to [facility] to establish GI care...    Today she wants me to check her incision & bone marrow bx sites (both clean, no drainage or erythema, just bruising at [facility] sites over iliac area);  she wants another UA & C&S (pending) to be sure there is no infection present;  she wants FLP (looks good on Lip20 + fish Oil), BMet (WNL), Vit D level (41- rec continue 1000u  daily supplement), stool cards to be sure there is no hidden blood, & she requests referral to an oncologic nutritionist to discuss nutritional needs w/ her anticipated chemoRx.[name].  ~  April 19,2012:  [name] requested add-on appt to discuss her BP> she brings home record w/ BPs on the low side & had prev discussed this w/ DrWall & they decr her Norvasc from 10mg  to 5mg /d (taking 1/2 tab now);  Currently BPs are 120-140 sys but occas down to 100 & she will feel weak when the BP is that low;  We discussed the situation (esp in light of her upcoming XRT at [facility] for her Lymphoma)> rec to take her MicardisHCT in AM at 1/2 to 1 tab dose depending on the BP reading & how she feels; and take the Amlodipine 10mg tabs- 1/4 to 1/2 tab in the PM similarly depending on her BP reading & how she feels... She understands the rationale here & agrees w/ this plan.  ~  March 13, 2011:  54mo ROV & she has completed her XRT from [facility] and her recent PET-CT 6/12 was neg- no evid of disease (DrRichards note of 6/12 is reviewed in detail);  She is also followed at the [facility] w/ an "itchy, burning scalp telogen effluvium (s/p  new lymphoma dx)> she was given Remeron & Neurontin Rx... 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  She had blood work done 7/12 at her request & she received a copy in advance> she has mult questions regarding the findings> reviewed in detail w/ pt today: LDL=121, WBC=4.4, 8% eos, UA is clear... She had a BMD as well w/ normal TScores... She is asking about a sleep study but wants to wait on sched this.  ~  September 05, 2011:  698mo ROV & add-on appt for back pain> presents w/ 98mo hx LBP, intermittent/ good days & bad, using ice/ heat, OTC Aleve & husb Flexeril which really helped, ?triggered by exerc on treadmill & yard work;  We discussed further eval w/ XRays, ?scans, ?ortho (she has seen DrBeane in 2007 & DrKuzma in  2008)> but she'd prefer to continue as is w/ ice/ heat, OTC analgesics and her own prescription for Flexeril-ok...           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 ~  4/12:  Pt informs me she has decided to proceed w/ XRT from DrMorris at [facility], they plan 24Gy total in 12 fractions;  Dr. Wendee Copp is her Oncologist... ~  7/12:  She has completed the XRT, and recent PET-CT was neg, no resid disease evident; 6/12 note from DrRichards reviewed in detail...  GLAUCOMA (ICD-365.9) - on COMBIGAN drops Bid per Ophthalomology DrStoneburner for "ocular hypertension, glaucoma, & drusen"...  ALLERGIC RHINITIS (ICD-477.9) - on CLARITIN 10mg  Prn...  Hx of PULMONARY NODULE (ICD-518.89) - prev CTChest w/ sm bilat nodules & no change x yrs... first scan 2003 for chr cough/  followed by DrBurney x yrs &  last CT 12/09 showed no ch in two 6mm nodules-one RLL, one left interlobar fissure... ~  yearly CXR's have been stable, clear & WNL (nodules not seen on regular CXR). ~  recheck by DrBurney 12/09 w/ f/u CT Chest showing 2 subcentimeter nodules w/o change from old scans, no other signif abnormalities. ~  CXR 12/10 is clear, NAD, no nodules seen. ~  2011-2012> see extensive staging eval for her intra-abd lymphoma diagnosed 1/12 w/ work-up at [facility], & these tiny nodules have not changed.  HYPERTENSION (ICD-401.9) - on MICARDIS 80-12.5 daily, & NORVASC 10mg - taking 1/2 daily...   ~  2/12:  BP = 110/70, takes meds regularly & tolerates well... denies HA, fatigue, visual changes, palipit, dizziness, syncope, dyspnea, edema, etc...  ~  4/12:  SEE ABOVE> rec to take her MicardisHCT in AM at 1/2 to 1 tab dose depending on the BP reading & how she feels; and take the Amlodipine 10mg tabs- 1/4 to 1/2 tab in the PM similarly depending on her BP reading & how she feels... She understands the rationale here & agrees w/ this plan. ~  7/12:  BP= 114/70 on the  Micardis/HCT one daily & Norvasc10mg - 1/2 tab daily...  CHEST PAIN, ACUTE (ICD-786.50) MITRAL VALVE PROLAPSE (ICD-424.0) - takes ASA 81mg /d... she has seen DrWall in 2005, and again 4/09 for atypical CP & MVP... he recommended increased exercise program for her...  ~  2DEcho 7/03 showed mild prolapse of the ant MV leaflet... ~  NuclearStressTest 2/05 was negative- no ischemia, no infarction, EF= 81%... ~  Baseline EKG shows NSR, WNL, NAD...  HYPERCHOLESTEROLEMIA (ICD-272.0) - on PRAVASTATIN 20mg /d & FISH OIL 1000mg /d +  diet rx... ~  FLP 12/08 on Lip20 showed TChol 164, TG 70, HDL 40, LDL 110 ~  FLP 12/09 on Lip20 showed TChol 170, TG 124, HDL 37, LDL 108 ~  FLP 12/10 on Lip20 showed TChol 156, TG 71, HDL 42, LDL 100 ~  FLP 2/12 on Lip20 showed TChol 104, TG 53, HDL 37, LDL 56 ~  6/12:  Increased LFTs on labs from [facility]> pt stopped the Lip20... ~  FLP 7/12 (off Lip20) showed TChol 189, TG 141, HDL 40, LDL 121; LFTs are back to normal; try PRAV20. ~  FLP 1/13 on Prav20 showed TChol 151, TG 127, HDL 36, LDL 90; LFTs are wnl...  GERD (ICD-530.81) - on ZANTAC 150mg /d... prev on Nexium daily but DrRichards at [facility] preferred H2Blocker long term. ~   EGD 11/05 by Sunday Corn showed sm HH, min gastritis (no esophagitis and no Barrett's)... ~  repeat EGD 11/10 by DrMorgan showed sm HH, mild gastritis... continue PPI.  DIVERTICULOSIS OF COLON (ICD-562.10) COLONIC POLYPS (ICD-211.3) Hx of HEMORRHOIDS (ICD-455.6) ~  colonoscopy 4/05 by Sunday Corn showed divertics, fixed tortuous segm in sigmoid...  ~  repeat colon 11/10 by DrMorgan showed mod divertics and narrowed sigm/ stricture, no polyps... rec> FIBER! ~  repeat colon 1/12 by DrGrimm [facility] showed divertics, mild angulation of the sigmoid region, one 4mm sessile polyp in desc colon (hyperplastic)... pt indicates to me that the procedure was very painful 7 she will not be returning to DrGrimm... pt requests to establish w/ [facility] for GI follow  up.  FIBROCYSTIC BREAST DISEASE (ICD-610.1) - followed by DrStreck yearly due to +FamHx of breast cancer in mother... ~  Mammogram 9/11 w/ scat fibroglandular densities, no lesions seen, f/u 49yr suggested.  OSTEOPENIA (ICD-733.90) - on Calcium, Vitamins, Vit D 1000 u OTC supplement...  ~  BMD's at [facility] have been WNL.[name]. ~  labs 12/10 showed Vit D level = 44 ~  labs 2/12 showed Vit D level = 41  HEADACHE (ICD-784.0) - hx headache eval 1996 by [facility] et al... ~  she had a neg eval by DrWillis in 1999 for numbness in fingers and toes... ~  she went to the [facility] 9/11 w/ HA & CT Brain ordered by DrJGreene showed mild age related vol loss, NAD...  ANXIETY (ICD-300.00) - she takes ALPRAZOLAM 0.5mg - 1/2 to 1 tab Tid Prn...  ALOPECIA (ICD-704.00) - she had an evaluation from Dr. Aurea Graff at [facility] in [place] in 2002... he recommended Black Currant and Evening Primrose Oil for her hair health, along w/ Horse Chestnut for varicose veins and a DASH diet... she also did yoga, accupuncture, massage therapy, and "body talk" therapy... Dermatologists at [facility] recommended Rogaine therapy... ~  7/12:  She reports f/u at [facility] w/ dx of "itchy burning telogen effluvium" secondary to her lymphoma dx & they prescribed REMERON 15mg  Qhs & NEURONTIN 300mg /d...  Health Maintenance -  GYN= DrDickstein & pt states up-to-date... she saw DrKohut in 2008 for thyroid panel which was WNL.[name]. she had the Shingles vaccine 7/11.   Past Surgical History  Procedure Date  . S/p tah/bso   . S/p orif prox phalanx 02/2007    right little finger----Dr Merlyn Lot  . 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 Chauncey Mann, [facility]  . Abdominal hysterectomy 1996  . [facility] 2009    Cataract    Outpatient Encounter Prescriptions as of 09/05/2011  Medication Sig Dispense Refill  . amLODipine (NORVASC) 10 MG  tablet Take 0.5 tablets (5 mg total) by mouth daily.  30 tablet  11  .  aspirin 81 MG tablet Take 81 mg by mouth daily.        . brimonidine-timolol (COMBIGAN) 0.2-0.5 % ophthalmic solution Place 1 drop into both eyes every 12 (twelve) hours.        . cholecalciferol (VITAMIN D) 1000 UNITS tablet Take 1,000 Units by mouth daily.        [name] docusate sodium (COLACE) 100 MG capsule Take once daily       . esomeprazole (NEXIUM) 40 MG capsule Take 40 mg by mouth daily before breakfast.       . fish oil-omega-3 fatty acids 1000 MG capsule Take 1 g by mouth daily as needed.       [name] ibuprofen (ADVIL,MOTRIN) 200 MG tablet Take 600 mg by mouth as needed.      . loratadine (CLARITIN) 10 MG tablet Take 10 mg by mouth daily. As needed       . MICARDIS HCT 80-12.5 MG per tablet TAKE 1 TABLET IN THE     MORNING.  30 each  11  . Minoxidil 5 % FOAM Apply 3-5 mLs topically at bedtime. Apply to Scalp for hair loss at bedtime       . mirtazapine (REMERON) 15 MG tablet Take 7.5 mg by mouth at bedtime.       . Multiple Minerals-Vitamins (CITRACAL PLUS) TABS 1 tablet by mouth two times daily      . Multiple Vitamins-Minerals (CENTRUM) tablet Take 1 tablet by mouth daily.        . Multiple Vitamins-Minerals (OCUVITE PRESERVISION PO) 2 by mouth daily       . pravastatin (PRAVACHOL) 20 MG tablet Take 1 tablet (20 mg total) by mouth every evening.  30 tablet  6  . tretinoin (RETIN-A) 0.05 % cream Once daily       . DISCONTD: NEXIUM 40 MG capsule TAKE 1 CAPSULE ONCE DAILY 30 MINUTES PRIOR TO EVENING MEAL.  30 each  11  . cyclobenzaprine (FLEXERIL) 10 MG tablet Take 1 tablet (10 mg total) by mouth 3 (three) times daily as needed for muscle spasms.  50 tablet  0    No Known Allergies   Review of Systems        The patient complains of dyspnea on exertion, constipation, abdominal pain, gas/bloating, arthritis, and anxiety.  The patient denies fever, chills, sweats, anorexia, fatigue, weakness, malaise, weight loss, sleep disorder, blurring, diplopia, Colleen irritation, Colleen discharge, vision loss,  Colleen pain, photophobia, earache, ear discharge, tinnitus, decreased hearing, nasal congestion, nosebleeds, sore throat, hoarseness, chest pain, palpitations, syncope, orthopnea, PND, peripheral edema, cough, dyspnea at rest, excessive sputum, hemoptysis, wheezing, pleurisy, nausea, vomiting, diarrhea, change in bowel habits, melena, hematochezia, jaundice, indigestion/heartburn, dysphagia, odynophagia, dysuria, hematuria, urinary frequency, urinary hesitancy, nocturia, incontinence, back pain, joint pain, joint swelling, muscle cramps, muscle weakness, stiffness, sciatica, restless legs, leg pain at night, leg pain with exertion, rash, itching, dryness, suspicious lesions, paralysis, paresthesias, seizures, tremors, vertigo, transient blindness, frequent falls, frequent headaches, difficulty walking, depression, memory loss, confusion, cold intolerance, heat intolerance, polydipsia, polyphagia, polyuria, unusual weight change, abnormal bruising, bleeding, enlarged lymph nodes, urticaria, allergic rash, hay fever, and recurrent infections.     Objective:   Physical Exam      WD, WN, 66 y/o WF in NAD... GENERAL:  Alert & oriented; pleasant & cooperative... HEENT:  [place]/AT, EOM-wnl, PERRLA, EACs-clear, TMs-wnl, NOSE-clear, THROAT-clear & wnl. NECK:  Supple w/ full ROM; no JVD; normal carotid impulses w/o bruits; no thyromegaly or nodules palpated; no lymphadenopathy. CHEST:  Clear to P & A; without wheezes/ rales/ or rhonchi. HEART:  Regular Rhythm; without murmurs/ rubs/ or gallops. ABDOMEN:  Soft & nontender; midline scar of ELap surg 1/12; normal bowel sounds; no organomegaly or masses detected. EXT: without deformities, mild arthritic changes; no varicose veins/ +venous insuffic/ no edema. NEURO:  CN's intact; motor testing normal; sensory testing normal; gait normal & balance OK. DERM:  No lesions noted; no rash, + alopecia...  RADIOLOGY DATA:  Reviewed in the EPIC EMR & discussed w/ the patient...     >>CXR 12/10> normal heart size, clear lungs, NAD...    >>BMD 6/12> TScores -0.7 in Spine, and -0.5 in left FemNeck...    >>PET 6/12 at [facility]> no definite FDG avid lesions in abd & pelvis; 6mm RLL nodule w/ mild uptake; neck & abd LNs as described...    >>Mammogram & Breast MRI 9/12> unchanged & no suspicious lesions reported...    >>PET 12/12 at [facility] pt reports that this was neg, & DrRichards plans another in 84mo then yearly after that...  LABORATORY DATA:  Reviewed in the EPIC EMR & discussed w/ the patient...    >>LABS drawn 09/10/11 ([facility])> FLP-ok on Prev20;  Chems-wnl;  CBC-wnl;  TSH-wnl...   Assessment & Plan:   HBP>  BP well controlled on her 2 drug regimen> rec to take her MicardisHCT in AM and take the Amlodipine 10mg tabs- 1/2 tab in the PM...  LYMPHOMA>  Managed by [facility]Richards at [facility];  S/p surg & XRT> 6/12 note reviewed in detail- PET was neg, doing well, f/u 22mo planned...  Hx 2 pulm nodules>  No change x yrs on XRays/Scans; as noted PET-CT was neg as well, no changes in these benign nodules...  CHOL>  She stopped her Lipitor & f/u FLP w/ LDL 121; we discussed diet Rx but she feels that she will NOT be able to control this w/ diet alone, therefore rec refer to [facility] for their help...  GI> GERD, Divrtics, Hx Polyps, Hx Hems>  Follow[facility] by [facility]Grimm at [facility]...  ?Hx Osteopenia>  She has f/u BMD 6/12 that was wnl, normal TScores...  Anxiety>  She has Alpraz for prn use  Alopecia>  Followed by Delane Ginger now on Remeron & Neurontin for her itchy burning scalp related to a telogen effluvium...  Other medical problems as listed above....   Patient's Medications  New Prescriptions   CYCLOBENZAPRINE (FLEXERIL) 10 MG TABLET    Take 1 tablet (10 mg total) by mouth 3 (three) times daily as needed for muscle spasms.  Previous Medications   AMLODIPINE (NORVASC) 10 MG TABLET    Take 0.5 tablets (5 mg total) by mouth daily.   ASPIRIN 81 MG TABLET    Take 81 mg by mouth daily.      BRIMONIDINE-TIMOLOL (COMBIGAN) 0.2-0.5 % OPHTHALMIC SOLUTION    Place 1 drop into both eyes every 12 (twelve) hours.     CHOLECALCIFEROL (VITAMIN D) 1000 UNITS TABLET    Take 1,000 Units by mouth daily.     DOCUSATE SODIUM (COLACE) 100 MG CAPSULE    Take once daily    FISH OIL-OMEGA-3 FATTY ACIDS 1000 MG CAPSULE    Take 1 g by mouth daily as needed.    IBUPROFEN (ADVIL,MOTRIN) 200 MG TABLET    Take 600 mg by mouth as needed.   LORATADINE (CLARITIN) 10 MG TABLET  Take 10 mg by mouth daily. As needed    MICARDIS HCT 80-12.5 MG PER TABLET    TAKE 1 TABLET IN THE     MORNING.   MINOXIDIL 5 % FOAM    Apply 3-5 mLs topically at bedtime. Apply to Scalp for hair loss at bedtime    MIRTAZAPINE (REMERON) 15 MG TABLET    Take 7.5 mg by mouth at bedtime.    MULTIPLE MINERALS-VITAMINS (CITRACAL PLUS) TABS    1 tablet by mouth two times daily   MULTIPLE VITAMINS-MINERALS (CENTRUM) TABLET    Take 1 tablet by mouth daily.     MULTIPLE VITAMINS-MINERALS (OCUVITE PRESERVISION PO)    2 by mouth daily    PRAVASTATIN (PRAVACHOL) 20 MG TABLET    Take 1 tablet (20 mg total) by mouth every evening.   TRETINOIN (RETIN-A) 0.05 % CREAM    Once daily   Modified Medications   Modified Medication Previous Medication   ESOMEPRAZOLE (NEXIUM) 40 MG CAPSULE NEXIUM 40 MG capsule      Take 40 mg by mouth daily before breakfast.     TAKE 1 CAPSULE ONCE DAILY 30 MINUTES PRIOR TO EVENING MEAL.  Discontinued Medications   No medications on file

## 2011-09-29 ENCOUNTER — Encounter: Payer: Self-pay | Admitting: Pulmonary Disease

## 2011-10-03 NOTE — Progress Notes (Signed)
Subjective:    Patient ID: [facility], female    DOB: 1945-10-02, 66 y.o.   MRN: [id number]  HPI 66 y/o WF here for a follow up visit... she has multiple medical problems as noted below...    ~  August 09, 2009:  she's had a good year- f/u [facility] dept w/ EGD & Colon 11/10 done & basically OK... she fell ~645mo ago & seen by [facility]- no fracure, just bruised & Rx Ibuprofen... BP is well controlled on meds; Chol doing satis on Lip20;  she would like a Vit D level checked today as well (nl @ 44)...  ~  October 20, 2010:   [name] noted some hematuria 12/11 & saw DrPeterson for eval> cysto was neg, & CT Abd done in his office revealed a 4cm mass at the root of the mesentery;  [facility] to [facility] for [facility] of this mass & had ELap 09/26/10 by [facility] w/ gross total resection of the mass which appeared to be a Lymphoma;  she was referred to [facility] in Oncology & pt provides me w/ extensive records from their visit to discuss staging & Rx> this info is reviewed w/ the pt & scanned into our EMR...    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    [facility] indicates that she had [facility] left) but she was inadeq sedated (it was very painful procedure) & she will not be returning to these doctors;  the colon revealed divertics & one 4mm polyp (hyperplastic on path);  she requests referral to [facility] to establish GI care...    Today she wants me to check her incision & bone marrow bx sites (both clean, no drainage or erythema, just bruising at [facility] sites over iliac area);  she wants another UA & C&S (pending) to be sure there is no infection present;  she wants FLP (looks good on Lip20 + fish Oil), BMet (WNL), Vit D level (41- rec continue 1000u  daily supplement), stool cards to be sure there is no hidden blood, & she requests referral to an oncologic nutritionist to discuss nutritional needs w/ her anticipated chemoRx.[name].  ~  April 19,2012:  [name] requested add-on appt to discuss her BP> she brings home record w/ BPs on the low side & had prev discussed this w/ DrWall & they decr her Norvasc from 10mg  to 5mg /d (taking 1/2 tab now);  Currently BPs are 120-140 sys but occas down to 100 & she will feel weak when the BP is that low;  We discussed the situation (esp in light of her upcoming XRT at [facility] for her Lymphoma)> rec to take her MicardisHCT in AM at 1/2 to 1 tab dose depending on the BP reading & how she feels; and take the Amlodipine 10mg tabs- 1/4 to 1/2 tab in the PM similarly depending on her BP reading & how she feels... She understands the rationale here & agrees w/ this plan.  ~  March 13, 2011:  45mo ROV & she has completed her XRT from [facility] and her recent PET-CT 6/12 was neg- no evid of disease ([facility] note of 6/12 is reviewed in detail);  She is also followed at the [facility] Dept w/ an "itchy, burning scalp telogen effluvium (s/p  new lymphoma dx)> she was given Remeron & Neurontin Rx... 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  She had blood work done 7/12 at her request & she received a copy in advance> she has mult questions regarding the findings> reviewed in detail w/ pt today: LDL=121, WBC=4.4, 8% eos, UA is clear... She had a BMD as well w/ normal TScores... She is asking about a sleep study but wants to wait on sched this.  ~  September 05, 2011:  23mo ROV & add-on appt for back pain> presents w/ 24mo hx LBP, intermittent/ good days & bad, using ice/ heat, OTC Aleve & husb Flexeril which really helped, ?triggered by exerc on treadmill & yard work;  We discussed further eval w/ XRays, ?scans, ?ortho (she has seen DrBeane in 2007 & DrKuzma in  2008)> but she'd prefer to continue as is w/ ice/ heat, OTC analgesics and her own prescription for Flexeril-ok... ~  September 27, 2011:  ROV CPX per pt request but she had recent lab work & PET at [facility]- reviewed below;  She still has some back discomfort & made her own appt w/ Neurologist [facility]) for further eval;  See prob list below>>           Problem List:   LYMPHOMA (ICD-202.80) >> SEE ABOVE, followed by [facility], Oncology at [facility]... ~  4/12:  Pt informs me she has decided to proceed w/ XRT from [facility] at [facility], they plan 24Gy total in 12 fractions;  Dr. Wendee Copp is her Oncologist... ~  7/12:  She has completed the XRT, and recent PET-CT was neg, no resid disease evident; 6/12 note from [facility] reviewed in detail... ~  1/13:  Reviewed notes from [facility]> PET 12/12 was neg & they plan f/u in 6 months; she also saw [facility] for mass right upper inner arm (prob benign)- they planned excision but it got smaller in the interim & they decided just to watch it.  GLAUCOMA (ICD-365.9) - on COMBIGAN drops Bid per Ophthalomology [facility] for "ocular hypertension, glaucoma, & drusen"...  ALLERGIC RHINITIS (ICD-477.9) - on CLARITIN 10mg  Prn...  Hx of PULMONARY NODULE (ICD-518.89) - prev CTChest w/ sm bilat nodules & no change x yrs... first scan 2003 for chr cough/  followed by [facility] x yrs &  last CT 12/09 showed no ch in two 6mm nodules-one RLL, one left interlobar fissure... ~  yearly CXR's have been stable, clear & WNL (nodules not seen on regular CXR). ~  recheck by [facility] 12/09 w/ f/u CT Chest showing 2 subcentimeter nodules w/o change from old scans, no other signif abnormalities. ~  CXR 12/10 is clear, NAD, no nodules seen. ~  2011-2012> see extensive staging eval for her intra-abd lymphoma diagnosed 1/12 w/ work-up at [facility], & these tiny nodules have not changed. ~  12/12: f/u PET scan at [facility] reports 4mm RLL nodule is mildly FDG avid (no change), 3mm  RLL nodule too small to characterize, neg nodes, abd is neg, DJD spine/shoulders/hips...   HYPERTENSION (ICD-401.9) - on MICARDIS 80-12.5 daily, & NORVASC 10mg - taking 1/2 daily...   ~  2/12:  BP = 110/70, takes meds regularly & tolerates well... denies HA, fatigue, visual changes, palipit, dizziness, syncope, dyspnea, edema, etc...  ~  4/12:  SEE ABOVE> rec to take her MicardisHCT in AM at 1/2 to 1 tab dose depending on the BP reading &  how she feels; and take the Amlodipine 10mg tabs- 1/4 to 1/2 tab in the PM similarly depending on her BP reading & how she feels... She understands the rationale here & agrees w/ this plan. ~  7/12:  BP= 114/70 on the Micardis/HCT one daily & Norvasc10mg - 1/2 tab daily... ~  1/13:  BP= 128/78 & she remains asymptomatic w/o CP, palpit, SOB, edema, etc...  CHEST PAIN, ACUTE (ICD-786.50) MITRAL VALVE PROLAPSE (ICD-424.0) - takes ASA 81mg /d... she has seen DrWall in 2005, and again 4/09 for atypical CP & MVP... he recommended increased exercise program for her...  ~  2DEcho 7/03 showed mild prolapse of the ant MV leaflet... ~  NuclearStressTest 2/05 was negative- no ischemia, no infarction, EF= 81%... ~  Baseline EKG shows NSR, WNL, NAD...  HYPERCHOLESTEROLEMIA (ICD-272.0) - on PRAVASTATIN 20mg /d, FISH OIL 1000mg /d + diet rx... ~  FLP 12/08 on Lip20 showed TChol 164, TG 70, HDL 40, LDL 110 ~  FLP 12/09 on Lip20 showed TChol 170, TG 124, HDL 37, LDL 108 ~  FLP 12/10 on Lip20 showed TChol 156, TG 71, HDL 42, LDL 100 ~  FLP 2/12 on Lip20 showed TChol 104, TG 53, HDL 37, LDL 56 ~  6/12:  Increased LFTs on labs from [facility]> pt stopped the Lip20... Referred to the [facility]>> ~  FLP 7/12 (off Lip20) showed TChol 189, TG 141, HDL 40, LDL 121; LFTs are back to normal; try PRAV20. ~  FLP 1/13 on Prav20 showed TChol 151, TG 127, HDL 36, LDL 90; LFTs are wnl...  GERD (ICD-530.81) - on ZANTAC 150mg /d... prev on Nexium daily but [facility] at [facility] preferred H2Blocker long  term. ~   EGD 11/05 by [facility] showed sm HH, min gastritis (no esophagitis and no Barrett's)... ~  repeat EGD 11/10 by [facility] showed sm HH, mild gastritis... continue PPI.  DIVERTICULOSIS OF COLON (ICD-562.10) COLONIC POLYPS (ICD-211.3) Hx of HEMORRHOIDS (ICD-455.6) ~  colonoscopy 4/05 by [facility] Corn showed divertics, fixed tortuous segm in sigmoid...  ~  repeat colon 11/10 by [facility] showed mod divertics and narrowed sigm/ stricture, no polyps... rec> FIBER! ~  repeat colon 1/12 by [facility] showed divertics, mild angulation of the sigmoid region, one 4mm sessile polyp in desc colon (hyperplastic)... pt indicates to me that the procedure was very painful 7 she will not be returning to [facility]... pt requests to establish w/ [facility] for GI follow up.  FIBROCYSTIC BREAST DISEASE (ICD-610.1) - followed by DrStreck yearly due to +FamHx of breast cancer in mother... ~  Mammogram 9/11 w/ scat fibroglandular densities, no lesions seen, f/u 31yr suggested.  OSTEOPENIA (ICD-733.90) - on Calcium, Vitamins, Vit D 1000 u OTC supplement...  ~  BMD's at [facility] have been WNL.[name]. ~  labs 12/10 showed Vit D level = 44 ~  labs 2/12 showed Vit D level = 41  HEADACHE (ICD-784.0) - hx headache eval 1996 by [facility] et al... ~  she had a neg eval by DrWillis in 1999 for numbness in fingers and toes... ~  [facility] w/ HA & CT Brain ordered by DrJGreene showed mild age related vol loss, NAD...  ANXIETY (ICD-300.00) - she takes ALPRAZOLAM 0.5mg - 1/2 to 1 tab Tid Prn...  ALOPECIA (ICD-704.00) - she had an evaluation from Dr. Aurea Graff at [facility] in [place] in 2002... he recommended Black Currant and Evening Primrose Oil for her hair health, along w/ Horse Chestnut for varicose veins and a DASH diet... she also  did yoga, accupuncture, massage therapy, and "body talk" therapy... Dermatologists at [facility] recommended Rogaine therapy... ~  7/12:  She reports f/u  at [facility] w/ dx of "itchy burning telogen effluvium" secondary to her lymphoma dx & they prescribed REMERON 15mg  Qhs & NEURONTIN 300mg /d...  Health Maintenance -  GYN= DrDickstein & pt states up-to-date... she saw DrKohut in 2008 for thyroid panel which was WNL.[name]. she had the Shingles vaccine 7/11.   Past Surgical History  Procedure Date  . S/p tah/bso   . S/p orif prox phalanx 02/2007    right little finger----Dr Merlyn Lot  . 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 Chauncey Mann, [facility]  . Abdominal hysterectomy 1996  . Eye surgery 2009    Cataract    Outpatient Encounter Prescriptions as of 09/27/2011  Medication Sig Dispense Refill  . amLODipine (NORVASC) 10 MG tablet Take 0.5 tablets (5 mg total) by mouth daily.  30 tablet  11  . aspirin 81 MG tablet Take 81 mg by mouth daily.        . brimonidine-timolol (COMBIGAN) 0.2-0.5 % ophthalmic solution Place 1 drop into both eyes every 12 (twelve) hours.        . cholecalciferol (VITAMIN D) 1000 UNITS tablet Take 1,000 Units by mouth daily.        . cyclobenzaprine (FLEXERIL) 10 MG tablet Take 1 tablet (10 mg total) by mouth 3 (three) times daily as needed for muscle spasms.  50 tablet  0  . docusate sodium (COLACE) 100 MG capsule Take once daily       . esomeprazole (NEXIUM) 40 MG capsule Take 40 mg by mouth daily before breakfast.       . fish oil-omega-3 fatty acids 1000 MG capsule Take 1 g by mouth daily as needed.       [name] ibuprofen (ADVIL,MOTRIN) 200 MG tablet Take 600 mg by mouth as needed.      . loratadine (CLARITIN) 10 MG tablet Take 10 mg by mouth daily. As needed       . MICARDIS HCT 80-12.5 MG per tablet TAKE 1 TABLET IN THE     MORNING.  30 each  11  . Minoxidil 5 % FOAM Apply 3-5 mLs topically at bedtime. Apply to Scalp for hair loss at bedtime       . mirtazapine (REMERON) 15 MG tablet Take 7.5 mg by mouth at bedtime.       . Multiple Minerals-Vitamins (CITRACAL PLUS) TABS 1 tablet by mouth two times daily       . Multiple Vitamins-Minerals (CENTRUM) tablet Take 1 tablet by mouth daily.        . Multiple Vitamins-Minerals (OCUVITE PRESERVISION PO) 2 by mouth daily       . pravastatin (PRAVACHOL) 20 MG tablet Take 1 tablet (20 mg total) by mouth every evening.  30 tablet  6  . tretinoin (RETIN-A) 0.05 % cream Once daily         No Known Allergies   Current Medications, Allergies, Past Medical History, Past Surgical History, Family History, and Social History were reviewed in [employer] record.    Review of Systems        The patient complains of dyspnea on exertion, constipation, abdominal pain, gas/bloating, arthritis, and anxiety.  The patient denies fever, chills, sweats, anorexia, fatigue, weakness, malaise, weight loss, sleep disorder, blurring, diplopia, eye irritation, eye discharge, vision loss, eye pain, photophobia, earache, ear discharge, tinnitus, decreased hearing, nasal congestion,  nosebleeds, sore throat, hoarseness, chest pain, palpitations, syncope, orthopnea, PND, peripheral edema, cough, dyspnea at rest, excessive sputum, hemoptysis, wheezing, pleurisy, nausea, vomiting, diarrhea, change in bowel habits, melena, hematochezia, jaundice, indigestion/heartburn, dysphagia, odynophagia, dysuria, hematuria, urinary frequency, urinary hesitancy, nocturia, incontinence, back pain, joint pain, joint swelling, muscle cramps, muscle weakness, stiffness, sciatica, restless legs, leg pain at night, leg pain with exertion, rash, itching, dryness, suspicious lesions, paralysis, paresthesias, seizures, tremors, vertigo, transient blindness, frequent falls, frequent headaches, difficulty walking, depression, memory loss, confusion, cold intolerance, heat intolerance, polydipsia, polyphagia, polyuria, unusual weight change, abnormal bruising, bleeding, enlarged lymph nodes, urticaria, allergic rash, hay fever, and recurrent infections.     Objective:   Physical Exam      WD,  WN, 66 y/o WF in NAD... GENERAL:  Alert & oriented; pleasant & cooperative... HEENT:  [place]/AT, EOM-wnl, PERRLA, EACs-clear, TMs-wnl, NOSE-clear, THROAT-clear & wnl. NECK:  Supple w/ full ROM; no JVD; normal carotid impulses w/o bruits; no thyromegaly or nodules palpated; no lymphadenopathy. CHEST:  Clear to P & A; without wheezes/ rales/ or rhonchi. HEART:  Regular Rhythm; without murmurs/ rubs/ or gallops. ABDOMEN:  Soft & nontender; midline scar of ELap surg 1/12; normal bowel sounds; no organomegaly or masses detected. EXT: without deformities, mild arthritic changes; no varicose veins/ +venous insuffic/ no edema. NEURO:  CN's intact; motor testing normal; sensory testing normal; gait normal & balance OK. DERM:  No lesions noted; no rash, + alopecia...  RADIOLOGY DATA:  Reviewed in the EPIC EMR & discussed w/ the patient...    >>CXR 12/10> normal heart size, clear lungs, NAD...    >>BMD 6/12> TScores -0.7 in Spine, and -0.5 in left FemNeck...    >>PET 6/12 at [facility]> no definite FDG avid lesions in abd & pelvis; 6mm RLL nodule w/ mild uptake; neck & abd LNs as described...    >>Mammogram & Breast MRI 9/12> unchanged & no suspicious lesions reported...    >>PET 12/12 at [facility] pt reports that this was neg, & [facility] plans another in 54mo then yearly after that...        report reviewed> 4mm RLL nodule is mildly FDG avid (no change), 3mm RLL nodule too small to characterize, neg nodes, abd is neg, DJD spine/shoulders/hips...  LABORATORY DATA:  Reviewed in the EPIC EMR & discussed w/ the patient...    >>LABS drawn 09/10/11 ([facility])> FLP-ok on Prev20;  Chems-wnl;  CBC-wnl;  TSH-wnl...    >>LABS from [facility] dated 09/25/11:  Chems-wnl;  CBC-wnl;     Assessment & Plan:   HBP>  BP well controlled on her 2 drug regimen> rec to take her MicardisHCT in AM and take the Amlodipine 10mg tabs- 1/2 tab in the PM...  LYMPHOMA>  Managed by [facility];  S/p surg & XRT> notes reviewed- PET was neg,  doing well, repeat PET in 54mo planned.  Hx 2 pulm nodules>  No change x yrs on XRays/Scans; as noted PET-CT was neg as well, no changes in these benign nodules...  CHOL>  Controlled on Prav20 & tol well; [facility] has released her & we plan f/u FLP in 6 months...  GI> GERD, Divrtics, Hx Polyps, Hx Hems>  [facility] by [facility]...  ?Hx Osteopenia>  She had f/u BMD 6/12 that was wnl, normal TScores...  Anxiety>  She has Alpraz for prn use  Alopecia>  Followed by Delane Ginger now on Remeron & Neurontin for her itchy burning scalp related to a telogen effluvium...  Other medical problems as listed  above....    Patient's Medications  New Prescriptions   No medications on file  Previous Medications   AMLODIPINE (NORVASC) 10 MG TABLET    Take 0.5 tablets (5 mg total) by mouth daily.   ASPIRIN 81 MG TABLET    Take 81 mg by mouth daily.     BRIMONIDINE-TIMOLOL (COMBIGAN) 0.2-0.5 % OPHTHALMIC SOLUTION    Place 1 drop into both eyes every 12 (twelve) hours.     CHOLECALCIFEROL (VITAMIN D) 1000 UNITS TABLET    Take 1,000 Units by mouth daily.     CYCLOBENZAPRINE (FLEXERIL) 10 MG TABLET    Take 1 tablet (10 mg total) by mouth 3 (three) times daily as needed for muscle spasms.   DOCUSATE SODIUM (COLACE) 100 MG CAPSULE    Take once daily    ESOMEPRAZOLE (NEXIUM) 40 MG CAPSULE    Take 40 mg by mouth daily before breakfast.    FISH OIL-OMEGA-3 FATTY ACIDS 1000 MG CAPSULE    Take 1 g by mouth daily as needed.    IBUPROFEN (ADVIL,MOTRIN) 200 MG TABLET    Take 600 mg by mouth as needed.   LORATADINE (CLARITIN) 10 MG TABLET    Take 10 mg by mouth daily. As needed    MICARDIS HCT 80-12.5 MG PER TABLET    TAKE 1 TABLET IN THE     MORNING.   MINOXIDIL 5 % FOAM    Apply 3-5 mLs topically at bedtime. Apply to Scalp for hair loss at bedtime    MIRTAZAPINE (REMERON) 15 MG TABLET    Take 7.5 mg by mouth at bedtime.    MULTIPLE MINERALS-VITAMINS (CITRACAL PLUS) TABS    1 tablet by mouth two times daily    MULTIPLE VITAMINS-MINERALS (CENTRUM) TABLET    Take 1 tablet by mouth daily.     MULTIPLE VITAMINS-MINERALS (OCUVITE PRESERVISION PO)    2 by mouth daily    PRAVASTATIN (PRAVACHOL) 20 MG TABLET    Take 1 tablet (20 mg total) by mouth every evening.   TRETINOIN (RETIN-A) 0.05 % CREAM    Once daily   Modified Medications   No medications on file  Discontinued Medications   No medications on file

## 2011-10-10 DIAGNOSIS — L570 Actinic keratosis: Secondary | ICD-10-CM | POA: Diagnosis not present

## 2011-10-10 DIAGNOSIS — D1801 Hemangioma of skin and subcutaneous tissue: Secondary | ICD-10-CM | POA: Diagnosis not present

## 2011-11-12 DIAGNOSIS — H52209 Unspecified astigmatism, unspecified eye: Secondary | ICD-10-CM | POA: Diagnosis not present

## 2011-11-12 DIAGNOSIS — Z961 Presence of intraocular lens: Secondary | ICD-10-CM | POA: Diagnosis not present

## 2011-11-12 DIAGNOSIS — H40059 Ocular hypertension, unspecified eye: Secondary | ICD-10-CM | POA: Diagnosis not present

## 2011-11-12 DIAGNOSIS — H259 Unspecified age-related cataract: Secondary | ICD-10-CM | POA: Diagnosis not present

## 2011-11-16 ENCOUNTER — Encounter (INDEPENDENT_AMBULATORY_CARE_PROVIDER_SITE_OTHER): Payer: Self-pay | Admitting: Surgery

## 2011-11-16 ENCOUNTER — Ambulatory Visit (INDEPENDENT_AMBULATORY_CARE_PROVIDER_SITE_OTHER): Payer: Medicare Other | Admitting: Surgery

## 2011-11-16 VITALS — BP 112/78 | HR 76 | Temp 98.4°F | Resp 18 | Ht 63.0 in | Wt 165.4 lb

## 2011-11-16 DIAGNOSIS — N6019 Diffuse cystic mastopathy of unspecified breast: Secondary | ICD-10-CM

## 2011-11-16 NOTE — Progress Notes (Signed)
  CC: High-risk breast followup HPI: This patient comes in for annual breast checks. She continues to have intermittent upper outer quadrant left breast pain. This is been present for years. She has had no changes in those symptoms.   ROS: There have been no changes since I last saw her. A PET scan in Dec was negative for any recurrence of her lymphoma    PE General: The patient is alert, oriented, and healthy-appearing. Breasts: The breasts are symmetric. There are no changes from her last visit. She continues to have mild tenderness. There is no dominant mass although they are slightly irregular. The skin, nipple, and areolar areas are normal. Lymphatics: There is no axillary or supraclavicular adenopathy  Data Reviewed PET as noted  Assessment Stable exam with no evidence of breast cancer in a patient with a strong family history of breast cancer  Plan We will continue to have her do annual followups here.

## 2011-11-16 NOTE — Patient Instructions (Signed)
See me again after your next mammogram

## 2011-11-27 DIAGNOSIS — H2510 Age-related nuclear cataract, unspecified eye: Secondary | ICD-10-CM | POA: Diagnosis not present

## 2011-11-27 DIAGNOSIS — H40009 Preglaucoma, unspecified, unspecified eye: Secondary | ICD-10-CM | POA: Diagnosis not present

## 2011-11-27 DIAGNOSIS — H25039 Anterior subcapsular polar age-related cataract, unspecified eye: Secondary | ICD-10-CM | POA: Diagnosis not present

## 2011-11-27 DIAGNOSIS — H25049 Posterior subcapsular polar age-related cataract, unspecified eye: Secondary | ICD-10-CM | POA: Diagnosis not present

## 2011-11-27 DIAGNOSIS — H40059 Ocular hypertension, unspecified eye: Secondary | ICD-10-CM | POA: Diagnosis not present

## 2011-12-10 ENCOUNTER — Other Ambulatory Visit: Payer: Self-pay | Admitting: Pulmonary Disease

## 2011-12-25 DIAGNOSIS — C8299 Follicular lymphoma, unspecified, extranodal and solid organ sites: Secondary | ICD-10-CM | POA: Diagnosis not present

## 2011-12-25 DIAGNOSIS — Z79899 Other long term (current) drug therapy: Secondary | ICD-10-CM | POA: Diagnosis not present

## 2011-12-25 DIAGNOSIS — F411 Generalized anxiety disorder: Secondary | ICD-10-CM | POA: Diagnosis not present

## 2011-12-25 DIAGNOSIS — E7800 Pure hypercholesterolemia, unspecified: Secondary | ICD-10-CM | POA: Diagnosis not present

## 2011-12-25 DIAGNOSIS — R7401 Elevation of levels of liver transaminase levels: Secondary | ICD-10-CM | POA: Diagnosis not present

## 2011-12-25 DIAGNOSIS — J984 Other disorders of lung: Secondary | ICD-10-CM | POA: Diagnosis not present

## 2011-12-25 DIAGNOSIS — I10 Essential (primary) hypertension: Secondary | ICD-10-CM | POA: Diagnosis not present

## 2011-12-25 DIAGNOSIS — K219 Gastro-esophageal reflux disease without esophagitis: Secondary | ICD-10-CM | POA: Diagnosis not present

## 2011-12-25 DIAGNOSIS — R7402 Elevation of levels of lactic acid dehydrogenase (LDH): Secondary | ICD-10-CM | POA: Diagnosis not present

## 2012-01-08 DIAGNOSIS — R911 Solitary pulmonary nodule: Secondary | ICD-10-CM | POA: Diagnosis not present

## 2012-01-08 DIAGNOSIS — C8589 Other specified types of non-Hodgkin lymphoma, extranodal and solid organ sites: Secondary | ICD-10-CM | POA: Diagnosis not present

## 2012-01-08 DIAGNOSIS — Z79899 Other long term (current) drug therapy: Secondary | ICD-10-CM | POA: Diagnosis not present

## 2012-01-08 DIAGNOSIS — Z87898 Personal history of other specified conditions: Secondary | ICD-10-CM | POA: Diagnosis not present

## 2012-01-17 LAB — HM DEXA SCAN: HM Dexa Scan: NORMAL

## 2012-01-23 ENCOUNTER — Other Ambulatory Visit: Payer: Self-pay | Admitting: *Deleted

## 2012-01-23 MED ORDER — CYCLOBENZAPRINE HCL 10 MG PO TABS: 10.0000 mg | ORAL_TABLET | Freq: Three times a day (TID) | ORAL | Status: DC | PRN

## 2012-02-22 ENCOUNTER — Ambulatory Visit: Payer: Medicare Other | Admitting: Adult Health

## 2012-03-07 ENCOUNTER — Other Ambulatory Visit: Payer: Self-pay | Admitting: Pulmonary Disease

## 2012-04-22 DIAGNOSIS — R911 Solitary pulmonary nodule: Secondary | ICD-10-CM | POA: Diagnosis not present

## 2012-04-22 DIAGNOSIS — F411 Generalized anxiety disorder: Secondary | ICD-10-CM | POA: Diagnosis not present

## 2012-04-22 DIAGNOSIS — C8299 Follicular lymphoma, unspecified, extranodal and solid organ sites: Secondary | ICD-10-CM | POA: Diagnosis not present

## 2012-04-22 DIAGNOSIS — E785 Hyperlipidemia, unspecified: Secondary | ICD-10-CM | POA: Diagnosis not present

## 2012-04-22 DIAGNOSIS — I10 Essential (primary) hypertension: Secondary | ICD-10-CM | POA: Diagnosis not present

## 2012-04-22 DIAGNOSIS — D72810 Lymphocytopenia: Secondary | ICD-10-CM | POA: Diagnosis not present

## 2012-04-22 DIAGNOSIS — K219 Gastro-esophageal reflux disease without esophagitis: Secondary | ICD-10-CM | POA: Diagnosis not present

## 2012-04-25 ENCOUNTER — Other Ambulatory Visit (INDEPENDENT_AMBULATORY_CARE_PROVIDER_SITE_OTHER): Payer: Self-pay | Admitting: Surgery

## 2012-04-25 DIAGNOSIS — Z1231 Encounter for screening mammogram for malignant neoplasm of breast: Secondary | ICD-10-CM

## 2012-05-05 DIAGNOSIS — H40059 Ocular hypertension, unspecified eye: Secondary | ICD-10-CM | POA: Diagnosis not present

## 2012-05-05 DIAGNOSIS — Z961 Presence of intraocular lens: Secondary | ICD-10-CM | POA: Diagnosis not present

## 2012-05-19 ENCOUNTER — Ambulatory Visit
Admission: RE | Admit: 2012-05-19 | Discharge: 2012-05-19 | Disposition: A | Discharge status: Home / Self Care | Payer: Medicare Other | Source: Ambulatory Visit | Attending: Surgery | Admitting: Surgery

## 2012-05-19 DIAGNOSIS — Z1231 Encounter for screening mammogram for malignant neoplasm of breast: Secondary | ICD-10-CM | POA: Diagnosis not present

## 2012-05-26 DIAGNOSIS — R3919 Other difficulties with micturition: Secondary | ICD-10-CM | POA: Diagnosis not present

## 2012-05-26 DIAGNOSIS — Z803 Family history of malignant neoplasm of breast: Secondary | ICD-10-CM | POA: Diagnosis not present

## 2012-05-26 DIAGNOSIS — B373 Candidiasis of vulva and vagina: Secondary | ICD-10-CM | POA: Diagnosis not present

## 2012-05-26 DIAGNOSIS — Z124 Encounter for screening for malignant neoplasm of cervix: Secondary | ICD-10-CM | POA: Diagnosis not present

## 2012-05-26 DIAGNOSIS — Z01419 Encounter for gynecological examination (general) (routine) without abnormal findings: Secondary | ICD-10-CM | POA: Diagnosis not present

## 2012-06-12 ENCOUNTER — Other Ambulatory Visit: Payer: Self-pay | Admitting: Pulmonary Disease

## 2012-07-10 ENCOUNTER — Encounter: Payer: Self-pay | Admitting: Adult Health

## 2012-07-10 ENCOUNTER — Ambulatory Visit (INDEPENDENT_AMBULATORY_CARE_PROVIDER_SITE_OTHER)
Admission: RE | Admit: 2012-07-10 | Discharge: 2012-07-10 | Disposition: A | Discharge status: Home / Self Care | Payer: Medicare Other | Source: Ambulatory Visit | Attending: Adult Health | Admitting: Adult Health

## 2012-07-10 ENCOUNTER — Ambulatory Visit (INDEPENDENT_AMBULATORY_CARE_PROVIDER_SITE_OTHER): Payer: Medicare Other | Admitting: Adult Health

## 2012-07-10 VITALS — BP 126/76 | HR 77 | Temp 97.0°F | Ht 63.0 in | Wt 169.0 lb

## 2012-07-10 DIAGNOSIS — J209 Acute bronchitis, unspecified: Secondary | ICD-10-CM | POA: Insufficient documentation

## 2012-07-10 DIAGNOSIS — R042 Hemoptysis: Secondary | ICD-10-CM

## 2012-07-10 DIAGNOSIS — C8589 Other specified types of non-Hodgkin lymphoma, extranodal and solid organ sites: Secondary | ICD-10-CM

## 2012-07-10 MED ORDER — AMOXICILLIN-POT CLAVULANATE 875-125 MG PO TABS: 1.0000 | ORAL_TABLET | Freq: Two times a day (BID) | ORAL | Status: AC

## 2012-07-10 MED ORDER — HYDROCODONE-HOMATROPINE 5-1.5 MG/5ML PO SYRP: 5.0000 mL | ORAL_SOLUTION | Freq: Four times a day (QID) | ORAL | Status: AC | PRN

## 2012-07-10 NOTE — Patient Instructions (Addendum)
Augmentin 875mg  Twice daily  For 7 days  Mucinex DM Twice daily  As needed  Cough/congesiton  Fluids and rest  Hydromet 1-2 tsp every 4-6 hrs As needed  Cough, may make you sleepy  Please contact office for sooner follow up if symptoms do not improve or worsen or seek emergency care

## 2012-07-10 NOTE — Assessment & Plan Note (Signed)
Slow to resolve Bronchitis  CXR w/ no acute process   Plan Augmentin 847m Twice daily  For 7 days  Mucinex DM Twice daily  As needed  Cough/congesiton  Fluids and rest  Hydromet 1-2 tsp every 4-6 hrs As needed  Cough, may make you sleepy  Please contact office for sooner follow up if symptoms do not improve or worsen or seek emergency care

## 2012-07-10 NOTE — Progress Notes (Signed)
Subjective:    Patient ID: Colleen White, female    DOB: [birth year], 66 y.o.   MRN: [id number]  HPI 66 y/o WF  ~  August 09, 2009:  she's had a good year- f/u [facility] GI dept w/ EGD & Colon 11/10 done & basically OK... she fell ~16mo ago & seen by [facility]- no fracure, just bruised & Rx Ibuprofen... BP is well controlled on meds; Chol doing satis on Lip20;  she would like a Vit D level checked today as well (nl @ 44)...  ~  October 20, 2010:   [name] noted some hematuria 12/11 & saw DrPeterson for eval> cysto was neg, & CT Abd done in his office revealed a 4cm mass at the root of the mesentery;  she chose to go to [facility] for eval of this mass & had ELap 09/26/10 by [facility] w/ gross total resection of the mass which appeared to be a Lymphoma;  she was referred to DrRichards in Oncology & pt provides me w/ extensive records from their visit to discuss staging & Rx> this info is reviewed w/ the pt & scanned into our EMR...    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    [name] indicates that she had another colonoscopy done at [facility] by DrGrimm & Crockett (DrMorgan has left) but she was inadeq sedated (it was very painful procedure) & she will not be returning to these doctors;  the colon revealed divertics & one 4mm polyp (hyperplastic on path);  she requests referral to [facility] to establish GI care...    Today she wants me to check her incision & bone marrow bx sites (both clean, no drainage or erythema, just bruising at [facility] sites over iliac area);  she wants another UA & C&S (pending) to be sure there is no infection present;  she wants FLP (looks good on Lip20 + fish Oil), BMet (WNL), Vit D level (41- rec continue 1000u daily supplement), stool cards to be sure there is no hidden blood, & she requests  referral to an oncologic nutritionist to discuss nutritional needs w/ her anticipated chemoRx.[name].  ~  April 19,2012:  [name] requested add-on appt to discuss her BP> she brings home record w/ BPs on the low side & had prev discussed this w/ DrWall & they decr her Norvasc from 10mg  to 5mg /d (taking 1/2 tab now);  Currently BPs are 120-140 sys but occas down to 100 & she will feel weak when the BP is that low;  We discussed the situation (esp in light of her upcoming XRT at [facility] for her Lymphoma)> rec to take her MicardisHCT in AM at 1/2 to 1 tab dose depending on the BP reading & how she feels; and take the Amlodipine 10mg tabs- 1/4 to 1/2 tab in the PM similarly depending on her BP reading & how she feels... She understands the rationale here & agrees w/ this plan.  ~  March 13, 2011:  14mo ROV & she has completed her XRT from [facility] and her recent PET-CT 6/12 was neg- no evid of disease (DrRichards note of 6/12 is reviewed in detail);  She is also followed at the [facility] Dept w/ an "itchy, burning scalp telogen effluvium (s/p new lymphoma dx)> she was given Remeron & Neurontin Rx... 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  She had blood work done 7/12 at her request & she received a copy in advance> she has mult questions regarding the findings> reviewed in detail w/ pt today: LDL=121, WBC=4.4, 8% eos, UA is clear... She had a BMD as well w/ normal TScores... She is asking about a sleep study but wants to wait on sched this.  ~  September 05, 2011:  955mo ROV & add-on appt for back pain> presents w/ 55mo hx LBP, intermittent/ good days & bad, using ice/ heat, OTC Aleve & husb Flexeril which really helped, ?triggered by exerc on treadmill & yard work;  We discussed further eval w/ XRays, ?scans, ?ortho (she has seen DrBeane in 2007 & DrKuzma in 2008)> but she'd prefer to continue as is w/ ice/ heat, OTC analgesics and her own  prescription for Flexeril-ok... ~  September 27, 2011:  ROV CPX per pt request but she had recent lab work & PET at [facility]- reviewed below;  She still has some back discomfort & made her own appt w/ Neurologist Sula Soda) for further eval;  See prob list below>>     07/10/2012 Acute OV  Complains of head congestion, HA, sore throat, prod cough with yellow/bloody mucus, runny nose, tightness in chest x3 weeks -  Took left over zpack 2 weeks ago for only 3 days . Also using claritin, delsym.  Without much help.  Still coughing up thick mucus. Noticed yellow /bloody mucus.  No weight loss , n/v/d , dyspnea.  CXR today with no acute process.   Noticed knots along the inside of right elbow for 6 weeks. No known injury .             Problem List:   LYMPHOMA (ICD-202.80) >> SEE ABOVE, followed by DrRichards, Oncology at [facility]... ~  4/12:  Pt informs me she has decided to proceed w/ XRT from DrMorris at [facility], they plan 24Gy total in 12 fractions;  Dr. Wendee Copp is her Oncologist... ~  7/12:  She has completed the XRT, and recent PET-CT was neg, no resid disease evident; 6/12 note from DrRichards reviewed in detail... ~  1/13:  Reviewed notes from DrRichards> PET 12/12 was neg & they plan f/u in 6 months; [facility] DrOllila for mass right upper inner arm (prob benign)- they planned excision but it got smaller in the interim & they decided just to watch it.  GLAUCOMA (ICD-365.9) - on COMBIGAN drops Bid per Ophthalomology DrStoneburner for "ocular hypertension, glaucoma, & drusen"...  ALLERGIC RHINITIS (ICD-477.9) - on CLARITIN 10mg  Prn...  Hx of PULMONARY NODULE (ICD-518.89) - prev CTChest w/ sm bilat nodules & no change x yrs... first scan 2003 for chr cough/  followed by DrBurney x yrs &  last CT 12/09 showed no ch in two 6mm nodules-one RLL, one left interlobar fissure... ~  yearly CXR's have been stable, clear & WNL (nodules not seen on regular CXR). ~  recheck by DrBurney  12/09 w/ f/u CT Chest showing 2 subcentimeter nodules w/o change from old scans, no other signif abnormalities. ~  CXR 12/10 is clear, NAD, no nodules seen. ~  2011-2012> see extensive staging eval for her intra-abd lymphoma diagnosed 1/12 w/ work-up at [facility], & these tiny nodules have not changed. ~  12/12: f/u PET scan at [facility] reports 4mm RLL nodule is mildly FDG avid (no change), 3mm RLL nodule too  small to characterize, neg nodes, abd is neg, DJD spine/shoulders/hips...   HYPERTENSION (ICD-401.9) - on MICARDIS 80-12.5 daily, & NORVASC 10mg - taking 1/2 daily...   ~  2/12:  BP = 110/70, takes meds regularly & tolerates well... denies HA, fatigue, visual changes, palipit, dizziness, syncope, dyspnea, edema, etc...  ~  4/12:  SEE ABOVE> rec to take her MicardisHCT in AM at 1/2 to 1 tab dose depending on the BP reading & how she feels; and take the Amlodipine 10mg tabs- 1/4 to 1/2 tab in the PM similarly depending on her BP reading & how she feels... She understands the rationale here & agrees w/ this plan. ~  7/12:  BP= 114/70 on the Micardis/HCT one daily & Norvasc10mg - 1/2 tab daily... ~  1/13:  BP= 128/78 & she remains asymptomatic w/o CP, palpit, SOB, edema, etc...  CHEST PAIN, ACUTE (ICD-786.50) MITRAL VALVE PROLAPSE (ICD-424.0) - takes ASA 81mg /d... she has seen DrWall in 2005, and again 4/09 for atypical CP & MVP... he recommended increased exercise program for her...  ~  2DEcho 7/03 showed mild prolapse of the ant MV leaflet... ~  NuclearStressTest 2/05 was negative- no ischemia, no infarction, EF= 81%... ~  Baseline EKG shows NSR, WNL, NAD...  HYPERCHOLESTEROLEMIA (ICD-272.0) - on PRAVASTATIN 20mg /d, FISH OIL 1000mg /d + diet rx... ~  FLP 12/08 on Lip20 showed TChol 164, TG 70, HDL 40, LDL 110 ~  FLP 12/09 on Lip20 showed TChol 170, TG 124, HDL 37, LDL 108 ~  FLP 12/10 on Lip20 showed TChol 156, TG 71, HDL 42, LDL 100 ~  FLP 2/12 on Lip20 showed TChol 104, TG 53, HDL 37, LDL 56 ~  6/12:   Increased LFTs on labs from [facility]> pt stopped the Lip20... Referred to the [facility]>> ~  FLP 7/12 (off Lip20) showed TChol 189, TG 141, HDL 40, LDL 121; LFTs are back to normal; try PRAV20. ~  FLP 1/13 on Prav20 showed TChol 151, TG 127, HDL 36, LDL 90; LFTs are wnl...  GERD (ICD-530.81) - on ZANTAC 150mg /d... prev on Nexium daily but DrRichards at [facility] preferred H2Blocker long term. ~   EGD 11/05 by Sunday Corn showed sm HH, min gastritis (no esophagitis and no Barrett's)... ~  repeat EGD 11/10 by DrMorgan showed sm HH, mild gastritis... continue PPI.  DIVERTICULOSIS OF COLON (ICD-562.10) COLONIC POLYPS (ICD-211.3) Hx of HEMORRHOIDS (ICD-455.6) ~  colonoscopy 4/05 by Sunday Corn showed divertics, fixed tortuous segm in sigmoid...  ~  repeat colon 11/10 by DrMorgan showed mod divertics and narrowed sigm/ stricture, no polyps... rec> FIBER! ~  repeat colon 1/12 by DrGrimm [facility] showed divertics, mild angulation of the sigmoid region, one 4mm sessile polyp in desc colon (hyperplastic)... pt indicates to me that the procedure was very painful 7 she will not be returning to DrGrimm... pt requests to establish w/ [facility] for GI follow up.  FIBROCYSTIC BREAST DISEASE (ICD-610.1) - followed by DrStreck yearly due to +FamHx of breast cancer in mother... ~  Mammogram 9/11 w/ scat fibroglandular densities, no lesions seen, f/u 41yr suggested.  OSTEOPENIA (ICD-733.90) - on Calcium, Vitamins, Vit D 1000 u OTC supplement...  ~  BMD's at [facility] have been WNL.[name]. ~  labs 12/10 showed Vit D level = 44 ~  labs 2/12 showed Vit D level = 41  HEADACHE (ICD-784.0) - hx headache eval 1996 by [facility] et al... ~  she had a neg eval by DrWillis in 1999 for numbness in fingers and toes... ~  she went to  the [facility] 9/11 w/ HA & CT Brain ordered by DrJGreene showed mild age related vol loss, NAD...  ANXIETY (ICD-300.00) - she takes ALPRAZOLAM 0.5mg - 1/2 to 1 tab Tid Prn...  ALOPECIA (ICD-704.00) - she had an  evaluation from Dr. Aurea Graff at [facility] in [place] in 2002... he recommended Black Currant and Evening Primrose Oil for her hair health, along w/ Horse Chestnut for varicose veins and a DASH diet... she also did yoga, accupuncture, massage therapy, and "body talk" therapy... Dermatologists at [facility] recommended Rogaine therapy... ~  7/12:  She reports f/u at [facility] w/ dx of "itchy burning telogen effluvium" secondary to her lymphoma dx & they prescribed REMERON 15mg  Qhs & NEURONTIN 300mg /d...  Health Maintenance -  GYN= DrDickstein & pt states up-to-date... she saw DrKohut in 2008 for thyroid panel which was WNL.[name]. she had the Shingles vaccine 7/11.   Past Surgical History  Procedure Date  . S/p tah/bso   . S/p orif prox phalanx 02/2007    right little finger----Dr Merlyn Lot  . 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 Chauncey Mann, [facility]  . Abdominal hysterectomy 1996  . Eye surgery 2009    Cataract    Outpatient Encounter Prescriptions as of 07/10/2012  Medication Sig Dispense Refill  . amLODipine (NORVASC) 10 MG tablet Take 0.5 tablets (5 mg total) by mouth daily.  30 tablet  11  . aspirin 81 MG tablet Take 81 mg by mouth daily.        . brimonidine-timolol (COMBIGAN) 0.2-0.5 % ophthalmic solution Place 1 drop into both eyes every 12 (twelve) hours.        . cholecalciferol (VITAMIN D) 1000 UNITS tablet Take 1,000 Units by mouth daily.        [name] docusate sodium (COLACE) 100 MG capsule Take once daily       . loratadine (CLARITIN) 10 MG tablet Take 10 mg by mouth daily. As needed       . MICARDIS HCT 80-12.5 MG per tablet TAKE 1 TABLET IN THE MORNING.  30 tablet  6  . Minoxidil 5 % FOAM Apply 3-5 mLs topically at bedtime. Apply to Scalp for hair loss at bedtime       . mirtazapine (REMERON) 15 MG tablet Take 7.5 mg by mouth at bedtime.       . Multiple Minerals-Vitamins (CITRACAL PLUS) TABS 1 tablet by mouth two times daily      . Multiple  Vitamins-Minerals (CENTRUM) tablet Take 1 tablet by mouth daily.        . Multiple Vitamins-Minerals (OCUVITE PRESERVISION PO) 2 by mouth daily       . NEXIUM 40 MG capsule TAKE 1 CAPSULE ONCE DAILY 30 MINUTES PRIOR TO EVENING MEAL.  30 capsule  6  . PRAVACHOL 20 MG tablet TAKE 1 TABLET IN THE P.M.  30 each  11  . tretinoin (RETIN-A) 0.05 % cream Once daily       . cyclobenzaprine (FLEXERIL) 10 MG tablet Take 1 tablet (10 mg total) by mouth 3 (three) times daily as needed for muscle spasms.  50 tablet  3  . [DISCONTINUED] fish oil-omega-3 fatty acids 1000 MG capsule Take 1 g by mouth daily as needed.       . [DISCONTINUED] ibuprofen (ADVIL,MOTRIN) 200 MG tablet Take 600 mg by mouth as needed.        No Known Allergies   Current Medications, Allergies, Past Medical History, Past Surgical History, Family History,  and Social History were reviewed in [employer] record.    Review of Systems Constitutional:   No  weight loss, night sweats,  Fevers, chills, fatigue, or  lassitude.  HEENT:   No headaches,  Difficulty swallowing,  Tooth/dental problems, or  Sore throat,                No sneezing, itching, ear ache,  +nasal congestion, post nasal drip,   CV:  No chest pain,  Orthopnea, PND, swelling in lower extremities, anasarca, dizziness, palpitations, syncope.   GI  No heartburn, indigestion, abdominal pain, nausea, vomiting, diarrhea, change in bowel habits, loss of appetite, bloody stools.   Resp:  ,   No chest wall deformity  Skin: no rash or lesions.  GU: no dysuria, change in color of urine, no urgency or frequency.  No flank pain, no hematuria   MS:  No joint pain or swelling.  No decreased range of motion.  No back pain.  Psych:  No change in mood or affect. No depression or anxiety.  No memory loss.      Objective:   Physical Exam      WD, WN, 66 y/o WF in NAD... GENERAL:  Alert & oriented; pleasant & cooperative... HEENT:  [place]/AT,, EACs-clear,  TMs-wnl, NOSE-clear, THROAT-clear & wnl. NECK:  Supple w/ full ROM; no JVD; normal carotid impulses w/o bruits; no thyromegaly or nodules palpated; no adenopathy  CHEST:  Clear to P & A; without wheezes/ rales/ or rhonchi. HEART:  Regular Rhythm; without murmurs/ rubs/ or gallops. ABDOMEN:  Soft & nontender;  normal bowel sounds; no organomegaly or masses detected. EXT: without deformities, mild arthritic changes; no varicose veins/ +venous insuffic/ no edema. Along right medial elbow palpable scattered nodules - ? Supratrochlear lymph nodes.  NEURO:  ; gait normal & balance OK. DERM:  No lesions noted; no rash,

## 2012-07-10 NOTE — Assessment & Plan Note (Signed)
Palpable knots along right medial elbow ? adnenopathy Advised to make ov with oncology as planned next week.

## 2012-07-11 NOTE — Addendum Note (Signed)
Addended by: Parke Poisson E on: 07/11/2012 09:07 AM   Modules accepted: Orders

## 2012-07-22 DIAGNOSIS — Z79899 Other long term (current) drug therapy: Secondary | ICD-10-CM | POA: Diagnosis not present

## 2012-07-22 DIAGNOSIS — C8589 Other specified types of non-Hodgkin lymphoma, extranodal and solid organ sites: Secondary | ICD-10-CM | POA: Diagnosis not present

## 2012-07-22 DIAGNOSIS — C8299 Follicular lymphoma, unspecified, extranodal and solid organ sites: Secondary | ICD-10-CM | POA: Diagnosis not present

## 2012-07-22 DIAGNOSIS — R197 Diarrhea, unspecified: Secondary | ICD-10-CM | POA: Diagnosis not present

## 2012-07-22 DIAGNOSIS — I10 Essential (primary) hypertension: Secondary | ICD-10-CM | POA: Diagnosis not present

## 2012-07-22 DIAGNOSIS — F411 Generalized anxiety disorder: Secondary | ICD-10-CM | POA: Diagnosis not present

## 2012-07-22 DIAGNOSIS — R229 Localized swelling, mass and lump, unspecified: Secondary | ICD-10-CM | POA: Diagnosis not present

## 2012-07-22 DIAGNOSIS — R911 Solitary pulmonary nodule: Secondary | ICD-10-CM | POA: Diagnosis not present

## 2012-07-22 DIAGNOSIS — D72810 Lymphocytopenia: Secondary | ICD-10-CM | POA: Diagnosis not present

## 2012-07-22 DIAGNOSIS — E7800 Pure hypercholesterolemia, unspecified: Secondary | ICD-10-CM | POA: Diagnosis not present

## 2012-07-22 DIAGNOSIS — K219 Gastro-esophageal reflux disease without esophagitis: Secondary | ICD-10-CM | POA: Diagnosis not present

## 2012-07-22 DIAGNOSIS — R634 Abnormal weight loss: Secondary | ICD-10-CM | POA: Diagnosis not present

## 2012-08-07 DIAGNOSIS — D1739 Benign lipomatous neoplasm of skin and subcutaneous tissue of other sites: Secondary | ICD-10-CM | POA: Diagnosis not present

## 2012-08-07 DIAGNOSIS — Z87898 Personal history of other specified conditions: Secondary | ICD-10-CM | POA: Diagnosis not present

## 2012-08-07 DIAGNOSIS — D1779 Benign lipomatous neoplasm of other sites: Secondary | ICD-10-CM | POA: Diagnosis not present

## 2012-09-08 DIAGNOSIS — L578 Other skin changes due to chronic exposure to nonionizing radiation: Secondary | ICD-10-CM | POA: Diagnosis not present

## 2012-09-08 DIAGNOSIS — L299 Pruritus, unspecified: Secondary | ICD-10-CM | POA: Diagnosis not present

## 2012-09-08 DIAGNOSIS — L821 Other seborrheic keratosis: Secondary | ICD-10-CM | POA: Diagnosis not present

## 2012-09-30 ENCOUNTER — Other Ambulatory Visit (INDEPENDENT_AMBULATORY_CARE_PROVIDER_SITE_OTHER): Payer: Medicare Other

## 2012-09-30 ENCOUNTER — Ambulatory Visit (INDEPENDENT_AMBULATORY_CARE_PROVIDER_SITE_OTHER): Payer: Medicare Other | Admitting: Pulmonary Disease

## 2012-09-30 ENCOUNTER — Encounter: Payer: Self-pay | Admitting: Pulmonary Disease

## 2012-09-30 VITALS — BP 108/72 | HR 63 | Temp 97.2°F | Ht 63.0 in | Wt 158.8 lb

## 2012-09-30 DIAGNOSIS — Z23 Encounter for immunization: Secondary | ICD-10-CM | POA: Diagnosis not present

## 2012-09-30 DIAGNOSIS — M899 Disorder of bone, unspecified: Secondary | ICD-10-CM

## 2012-09-30 DIAGNOSIS — E538 Deficiency of other specified B group vitamins: Secondary | ICD-10-CM

## 2012-09-30 DIAGNOSIS — D126 Benign neoplasm of colon, unspecified: Secondary | ICD-10-CM

## 2012-09-30 DIAGNOSIS — E7800 Pure hypercholesterolemia, unspecified: Secondary | ICD-10-CM | POA: Diagnosis not present

## 2012-09-30 DIAGNOSIS — C8589 Other specified types of non-Hodgkin lymphoma, extranodal and solid organ sites: Secondary | ICD-10-CM

## 2012-09-30 DIAGNOSIS — F411 Generalized anxiety disorder: Secondary | ICD-10-CM | POA: Diagnosis not present

## 2012-09-30 DIAGNOSIS — I059 Rheumatic mitral valve disease, unspecified: Secondary | ICD-10-CM

## 2012-09-30 DIAGNOSIS — K5730 Diverticulosis of large intestine without perforation or abscess without bleeding: Secondary | ICD-10-CM

## 2012-09-30 DIAGNOSIS — I10 Essential (primary) hypertension: Secondary | ICD-10-CM

## 2012-09-30 DIAGNOSIS — K219 Gastro-esophageal reflux disease without esophagitis: Secondary | ICD-10-CM

## 2012-09-30 LAB — CBC WITH DIFFERENTIAL/PLATELET
Basophils Absolute: 0 10*3/uL (ref 0.0–0.1)
Basophils Relative: 0.4 % (ref 0.0–3.0)
Eosinophils Absolute: 0.1 10*3/uL (ref 0.0–0.7)
Eosinophils Relative: 2.6 % (ref 0.0–5.0)
HCT: 38.7 % (ref 36.0–46.0)
Hemoglobin: 13.3 g/dL (ref 12.0–15.0)
Lymphocytes Relative: 25.9 % (ref 12.0–46.0)
Lymphs Abs: 1.1 10*3/uL (ref 0.7–4.0)
MCHC: 34.2 g/dL (ref 30.0–36.0)
MCV: 91.3 fl (ref 78.0–100.0)
Monocytes Absolute: 0.4 10*3/uL (ref 0.1–1.0)
Monocytes Relative: 9.2 % (ref 3.0–12.0)
Neutro Abs: 2.6 10*3/uL (ref 1.4–7.7)
Neutrophils Relative %: 61.9 % (ref 43.0–77.0)
Platelets: 230 10*3/uL (ref 150.0–400.0)
RBC: 4.24 Mil/uL (ref 3.87–5.11)
RDW: 12.2 % (ref 11.5–14.6)
WBC: 4.3 10*3/uL — ABNORMAL LOW (ref 4.5–10.5)

## 2012-09-30 LAB — BASIC METABOLIC PANEL
BUN: 12 mg/dL (ref 6–23)
CO2: 31 mEq/L (ref 19–32)
Calcium: 9.4 mg/dL (ref 8.4–10.5)
Chloride: 102 mEq/L (ref 96–112)
Creatinine, Ser: 0.7 mg/dL (ref 0.4–1.2)
GFR: 94.68 mL/min (ref >60.00)
Glucose, Bld: 98 mg/dL (ref 70–99)
Potassium: 4 mEq/L (ref 3.5–5.1)
Sodium: 138 mEq/L (ref 135–145)

## 2012-09-30 LAB — LDL CHOLESTEROL, DIRECT: Direct LDL: 142.5 mg/dL

## 2012-09-30 LAB — HEPATIC FUNCTION PANEL
ALT: 20 U/L (ref 0–35)
AST: 24 U/L (ref 0–37)
Albumin: 4.1 g/dL (ref 3.5–5.2)
Alkaline Phosphatase: 86 U/L (ref 39–117)
Bilirubin, Direct: 0 mg/dL (ref 0.0–0.3)
Total Bilirubin: 0.7 mg/dL (ref 0.3–1.2)
Total Protein: 7.2 g/dL (ref 6.0–8.3)

## 2012-09-30 LAB — TSH: TSH: 0.97 u[IU]/mL (ref 0.35–5.50)

## 2012-09-30 LAB — MAGNESIUM: Magnesium: 2 mg/dL (ref 1.5–2.5)

## 2012-09-30 LAB — LIPID PANEL
Cholesterol: 212 mg/dL — ABNORMAL HIGH (ref 0–200)
HDL: 36.3 mg/dL — ABNORMAL LOW (ref >39.00)
Total CHOL/HDL Ratio: 6
Triglycerides: 124 mg/dL (ref 0.0–149.0)
VLDL: 24.8 mg/dL (ref 0.0–40.0)

## 2012-09-30 LAB — VITAMIN B12: Vitamin B-12: 330 pg/mL (ref 211–911)

## 2012-09-30 MED ORDER — AMLODIPINE BESYLATE 10 MG PO TABS: 5.0000 mg | ORAL_TABLET | Freq: Every day | ORAL | Status: DC

## 2012-09-30 MED ORDER — PRAVASTATIN SODIUM 20 MG PO TABS: 20.0000 mg | ORAL_TABLET | Freq: Every day | ORAL | Status: DC

## 2012-09-30 MED ORDER — ESOMEPRAZOLE MAGNESIUM 40 MG PO CPDR: 40.0000 mg | DELAYED_RELEASE_CAPSULE | Freq: Every day | ORAL | Status: DC

## 2012-09-30 MED ORDER — TELMISARTAN-HCTZ 80-12.5 MG PO TABS: 1.0000 | ORAL_TABLET | Freq: Every day | ORAL | Status: DC

## 2012-09-30 NOTE — Progress Notes (Signed)
Subjective:    Patient ID: Colleen White, female    DOB: [birth year], 67 y.o.   MRN: [id number]  HPI 67 y/o WF here for a follow up visit... she has multiple medical problems as noted below...    ~  April 19,2012:  [name] requested add-on appt to discuss her BP> she brings home record w/ BPs on the low side & had prev discussed this w/ DrWall & they decr her Norvasc from 10mg  to 5mg /d (taking 1/2 tab now);  Currently BPs are 120-140 sys but occas down to 100 & she will feel weak when the BP is that low;  We discussed the situation (esp in light of her upcoming XRT at [facility] for her Lymphoma)> rec to take her MicardisHCT in AM at 1/2 to 1 tab dose depending on the BP reading & how she feels; and take the Amlodipine 10mg tabs- 1/4 to 1/2 tab in the PM similarly depending on her BP reading & how she feels... She understands the rationale here & agrees w/ this plan.  ~  March 13, 2011:  89mo ROV & she has completed her XRT from [facility] and her recent PET-CT 6/12 was neg- no evid of disease (DrRichards note of 6/12 is reviewed in detail);  She is also followed at the [facility] Dept w/ an "itchy, burning scalp telogen effluvium (s/p new lymphoma dx)> she was given Remeron & Neurontin Rx... 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  She had blood work done 7/12 at her request & she received a copy in advance> she has mult questions regarding the findings> reviewed in detail w/ pt today: LDL=121, WBC=4.4, 8% eos, UA is clear... She had a BMD as well w/ normal TScores... She is asking about a sleep study but wants to wait on sched this.  ~  September 05, 2011:  68mo ROV & add-on appt for back pain> presents w/ 68mo hx LBP, intermittent/ good days & bad, using ice/ heat, OTC Aleve & husb Flexeril which really helped, ?triggered by exerc on treadmill & yard work;  We discussed further eval w/ XRays, ?scans, ?ortho (she has seen  DrBeane in 2007 & DrKuzma in 2008)> but she'd prefer to continue as is w/ ice/ heat, OTC analgesics and her own prescription for Flexeril-ok... ~  September 27, 2011:  ROV CPX per pt request but she had recent lab work & PET at [facility]- reviewed below;  She still has some back discomfort & made her own appt w/ Neurologist Sula Soda) for further eval;  See prob list below>>  ~  September 30, 2012:  Yearly ROV & [name] has a list of labs she'd like [place] to check, plus stool cards, and refill prescriptions; her CC is a lipoma on her arm that she would like checked (it was eval by Chrisandra Netters at [facility] 12/13 & we have his note- right upper inner arm lipoma), and a TDAP due today as well... 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    Bilat Pulm Nodules> stable on serial CTs x many yrs by DrBurney; CXRs are clear w/o nodules seen; she denies cough, sputum, hemoptysis, SOB, etc...    HBP> on Amlodip10, MicardisHCT80-12.5;  BP= 108/72 & she denies CP/ angina/ dizzy, SOB, edema, etc...    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    Chol> on Prav20; FLP shows TChol 212, TG 124, HDL 36, LDL 142... No at goals but was better last yr so she wants to try better diet/ exercise/ same meds...    GI- GERD, Divertics, Polyps, Hems> on Nexium40, Colase100; she is followed at [facility] as noted below & up to date...    Fibrocystic breast dis> DrStreck checks her breasts once per  yr; she gets mammograms at the breast center; she is doing well...    Osteopenia> on calcium, MVI, VitD1000; BMDs per Gyn- DrMody, done in her office now & asked to get copies for [place]...    HAs> she's had HA evals from DrAdelman, DrWillis & the [facility] in the past; not currently on any analgesics...     Anxiety> on Remeron15mg - 1/2Qhs initially prescribed by Derm for her Telogen effluvium...    Alopecia> she sees Derm on Minoxidil foam, & Retin-A... We reviewed prob list, meds, xrays and labs> see below for updates >> she had the 2013 Flu vaccine last fall... Stool cards- pending             Problem List:   LYMPHOMA (ICD-202.80) >> SEE ABOVE, followed by DrRichards, Oncology at [facility]... ~  4/12:  Pt informs me she has decided to proceed w/ XRT from DrMorris at [facility], they plan 24Gy total in 12 fractions;  Dr. Wendee Copp is her Oncologist... ~  7/12:  She has completed the XRT, and recent PET-CT was neg, no resid disease evident; 6/12 note from DrRichards reviewed in detail... ~  1/13:  Reviewed notes from DrRichards> PET 12/12 was neg & they plan f/u in 6 months; [facility] DrOllila for mass right upper inner arm (prob benign)- they planned excision but it got smaller in the interim & they decided just to watch it. 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  GLAUCOMA (ICD-365.9) - on COMBIGAN drops Bid per Ophthalomology DrStoneburner for "ocular hypertension, glaucoma, & drusen"...  ALLERGIC RHINITIS (ICD-477.9) - on CLARITIN 10mg  Prn...  Hx of PULMONARY NODULE (ICD-518.89) - prev CTChest w/ sm bilat nodules  & no change x yrs... first scan 2003 for chr cough/  followed by DrBurney x yrs &  last CT 12/09 showed no ch in two 6mm nodules-one RLL, one left interlobar fissure... ~  yearly CXR's have been stable, clear & WNL (nodules not seen on regular CXR). ~  recheck by DrBurney 12/09 w/ f/u CT Chest showing 2 subcentimeter nodules w/o change from old scans, no other signif abnormalities. ~  CXR 12/10 is clear, NAD, no  nodules seen. ~  2011-2012> see extensive staging eval for her intra-abd lymphoma diagnosed 1/12 w/ work-up at [facility], & these tiny nodules have not changed. ~  12/12: f/u PET scan at [facility] reports 4mm RLL nodule is mildly FDG avid (no change), 3mm RLL nodule too small to characterize, neg nodes, abd is neg, DJD spine/shoulders/hips...  ~  CXR 11/13 showed normal heart size, clear lungs, NAD...  HYPERTENSION (ICD-401.9) - on MICARDIS 80-12.5 daily, & NORVASC 10mg - taking 1/2 daily...   ~  2/12:  BP = 110/70, takes meds regularly & tolerates well... denies HA, fatigue, visual changes, palipit, dizziness, syncope, dyspnea, edema, etc...  ~  4/12:  SEE ABOVE> rec to take her MicardisHCT in AM at 1/2 to 1 tab dose depending on the BP reading & how she feels; and take the Amlodipine 10mg tabs- 1/4 to 1/2 tab in the PM similarly depending on her BP reading & how she feels... She understands the rationale here & agrees w/ this plan. ~  7/12:  BP= 114/70 on the Micardis/HCT one daily & Norvasc10mg - 1/2 tab daily... ~  1/13:  BP= 128/78 & she remains asymptomatic w/o CP, palpit, SOB, edema, etc... ~  2/14:   on Amlodip10, MicardisHCT80-12.5;  BP= 108/72 & she denies CP/ angina/ dizzy, SOB, edema, etc.  CHEST PAIN, ACUTE (ICD-786.50) MITRAL VALVE PROLAPSE (ICD-424.0) - takes ASA 81mg /d... she has seen DrWall in 2005, and again 4/09 for atypical CP & MVP... he recommended increased exercise program for her...  ~  2DEcho 7/03 showed mild prolapse of the ant MV leaflet... ~  NuclearStressTest 2/05 was  negative- no ischemia, no infarction, EF= 81%... ~  Baseline EKG shows NSR, WNL, NAD...  HYPERCHOLESTEROLEMIA (ICD-272.0) - on PRAVASTATIN 20mg /d, FISH OIL 1000mg /d + diet rx... ~  FLP 12/08 on Lip20 showed TChol 164, TG 70, HDL 40, LDL 110 ~  FLP 12/09 on Lip20 showed TChol 170, TG 124, HDL 37, LDL 108 ~  FLP 12/10 on Lip20 showed TChol 156, TG 71, HDL 42, LDL 100 ~  FLP 2/12 on Lip20 showed TChol 104, TG 53, HDL 37, LDL 56 ~  6/12:  Increased LFTs on labs from [facility]> pt stopped the Lip20... Referred to the Lipid Clinic>> ~  FLP 7/12 (off Lip20) showed TChol 189, TG 141, HDL 40, LDL 121; LFTs are back to normal; try PRAV20. ~  FLP 1/13 on Prav20 showed TChol 151, TG 127, HDL 36, LDL 90; LFTs are wnl... ~  FLP 2/14 on Prav20 showed TChol 212, TG 124, HDL 36, LDL 142; needs better diet she does not want to incr meds.  GERD (ICD-530.81) - on ZANTAC 150mg /d... prev on Nexium daily but DrRichards at [facility] preferred H2Blocker long term. ~   EGD 11/05 by Sunday Corn showed sm HH, min gastritis (no esophagitis and no Barrett's)... ~  repeat EGD 11/10 by DrMorgan showed sm HH, mild gastritis... continue PPI.  DIVERTICULOSIS OF COLON (ICD-562.10) COLONIC POLYPS (ICD-211.3) Hx of HEMORRHOIDS (ICD-455.6) ~  colonoscopy 4/05 by Sunday Corn showed divertics, fixed tortuous segm in sigmoid...  ~  repeat colon 11/10 by DrMorgan showed mod divertics and narrowed sigm/ stricture, no polyps... rec> FIBER! ~  repeat colon 1/12 by DrGrimm [facility] showed divertics, mild angulation of the sigmoid region, one 4mm sessile polyp in desc colon (hyperplastic)... pt indicates to me that the procedure was very painful 7 she will not be returning to DrGrimm... pt requests to establish w/ [facility] for GI follow up.  FIBROCYSTIC BREAST DISEASE (  ICD-610.1) - followed by DrStreck yearly due to +FamHx of breast cancer in mother... ~  Mammogram 9/11 w/ scat fibroglandular densities, no lesions seen, f/u 40yr  suggested.  OSTEOPENIA (ICD-733.90) - on Calcium, Vitamins, Vit D 1000 u OTC supplement...  ~  BMD's at [facility] have been WNL.[name]. ~  labs 12/10 showed Vit D level = 44 ~  labs 2/12 showed Vit D level = 41 ~  BMD 6/12 showed TScores -0.7 in Spine, and -0.5 in left FemNeck. ~  Labs 2/14 showed Vit D level = 42  HEADACHE (ICD-784.0) - hx headache eval 1996 by [facility] et al... ~  she had a neg eval by DrWillis in 1999 for numbness in fingers and toes... ~  she went to the [facility] 9/11 w/ HA & CT Brain ordered by DrJGreene showed mild age related vol loss, NAD...  ANXIETY (ICD-300.00) - she takes Remeron 15mg - 1/2 Qhs for sleep...  ALOPECIA (ICD-704.00) - she had an evaluation from Dr. Aurea Graff at [facility] in [place] in 2002... he recommended Black Currant and Evening Primrose Oil for her hair health, along w/ Horse Chestnut for varicose veins and a DASH diet... she also did yoga, accupuncture, massage therapy, and "body talk" therapy... Dermatologists at [facility] recommended Rogaine therapy... ~  7/12:  She reports f/u at [facility] w/ dx of "itchy burning telogen effluvium" secondary to her lymphoma dx & they prescribed REMERON 15mg  Qhs & NEURONTIN 300mg /d...  Health Maintenance -  GYN= DrMody & pt states up-to-date; Mammogram at [facility] for DrStreck- neg 9/13... she saw DrKohut in 2008 for thyroid panel which was WNL.[name]. she had the Shingles vaccine 7/11.   Past Surgical History  Procedure Date  . S/p tah/bso   . S/p orif prox phalanx 02/2007    right little finger----Dr Merlyn Lot  . 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 Chauncey Mann, [facility]  . Abdominal hysterectomy 1996  . Eye surgery 2009    Cataract    Outpatient Encounter Prescriptions as of 09/30/2012  Medication Sig Dispense Refill  . amLODipine (NORVASC) 10 MG tablet Take 0.5 tablets (5 mg total) by mouth daily.  30 tablet  11  . aspirin 81 MG tablet Take 81 mg by mouth daily.         . brimonidine-timolol (COMBIGAN) 0.2-0.5 % ophthalmic solution Place 1 drop into both eyes every 12 (twelve) hours.        . cholecalciferol (VITAMIN D) 1000 UNITS tablet Take 1,000 Units by mouth daily.        . cyclobenzaprine (FLEXERIL) 10 MG tablet Take 1 tablet (10 mg total) by mouth 3 (three) times daily as needed for muscle spasms.  50 tablet  3  . docusate sodium (COLACE) 100 MG capsule Take once daily       . loratadine (CLARITIN) 10 MG tablet Take 10 mg by mouth daily. As needed       . MICARDIS HCT 80-12.5 MG per tablet TAKE 1 TABLET IN THE MORNING.  30 tablet  6  . Minoxidil 5 % FOAM Apply 3-5 mLs topically at bedtime. Apply to Scalp for hair loss at bedtime       . mirtazapine (REMERON) 15 MG tablet Take 7.5 mg by mouth at bedtime.       . Multiple Minerals-Vitamins (CITRACAL PLUS) TABS 1 tablet by mouth two times daily      . Multiple Vitamins-Minerals (CENTRUM) tablet Take 1 tablet by mouth daily.        [name]  Multiple Vitamins-Minerals (OCUVITE PRESERVISION PO) 2 by mouth daily       . NEXIUM 40 MG capsule TAKE 1 CAPSULE ONCE DAILY 30 MINUTES PRIOR TO EVENING MEAL.  30 capsule  6  . PRAVACHOL 20 MG tablet TAKE 1 TABLET IN THE P.M.  30 each  11  . tretinoin (RETIN-A) 0.05 % cream Once daily         No Known Allergies   Current Medications, Allergies, Past Medical History, Past Surgical History, Family History, and Social History were reviewed in [employer] record.    Review of Systems        The patient complains of dyspnea on exertion, constipation, abdominal pain, gas/bloating, arthritis, and anxiety.  The patient denies fever, chills, sweats, anorexia, fatigue, weakness, malaise, weight loss, sleep disorder, blurring, diplopia, eye irritation, eye discharge, vision loss, eye pain, photophobia, earache, ear discharge, tinnitus, decreased hearing, nasal congestion, nosebleeds, sore throat, hoarseness, chest pain, palpitations, syncope, orthopnea, PND,  peripheral edema, cough, dyspnea at rest, excessive sputum, hemoptysis, wheezing, pleurisy, nausea, vomiting, diarrhea, change in bowel habits, melena, hematochezia, jaundice, indigestion/heartburn, dysphagia, odynophagia, dysuria, hematuria, urinary frequency, urinary hesitancy, nocturia, incontinence, back pain, joint pain, joint swelling, muscle cramps, muscle weakness, stiffness, sciatica, restless legs, leg pain at night, leg pain with exertion, rash, itching, dryness, suspicious lesions, paralysis, paresthesias, seizures, tremors, vertigo, transient blindness, frequent falls, frequent headaches, difficulty walking, depression, memory loss, confusion, cold intolerance, heat intolerance, polydipsia, polyphagia, polyuria, unusual weight change, abnormal bruising, bleeding, enlarged lymph nodes, urticaria, allergic rash, hay fever, and recurrent infections.     Objective:   Physical Exam      WD, WN, 67 y/o WF in NAD... GENERAL:  Alert & oriented; pleasant & cooperative... HEENT:  [place]/AT, EOM-wnl, PERRLA, EACs-clear, TMs-wnl, NOSE-clear, THROAT-clear & wnl. NECK:  Supple w/ full ROM; no JVD; normal carotid impulses w/o bruits; no thyromegaly or nodules palpated; no lymphadenopathy. CHEST:  Clear to P & A; without wheezes/ rales/ or rhonchi. HEART:  Regular Rhythm; without murmurs/ rubs/ or gallops. ABDOMEN:  Soft & nontender; midline scar of ELap surg 1/12; normal bowel sounds; no organomegaly or masses detected. EXT: without deformities, mild arthritic changes; no varicose veins/ +venous insuffic/ no edema. NEURO:  CN's intact; motor testing normal; sensory testing normal; gait normal & balance OK. DERM:  No lesions noted; no rash, + alopecia...  RADIOLOGY DATA:  Reviewed in the EPIC EMR & discussed w/ the patient...    >>PET 6/12 at [facility]> no definite FDG avid lesions in abd & pelvis; 6mm RLL nodule w/ mild uptake; neck & abd LNs as described...    >>PET 12/12 at [facility] pt reports that this was  neg, & DrRichards plans another in 875mo then yearly after that...        report reviewed> 4mm RLL nodule is mildly FDG avid (no change), 3mm RLL nodule too small to characterize, neg nodes, abd is neg, DJD spine/shoulders/hips...  LABORATORY DATA:  Reviewed in the EPIC EMR & discussed w/ the patient...    Assessment & Plan:    HBP>  BP well controlled on her 2 drug regimen> rec to take her MicardisHCT in AM and take the Amlodipine 10mg tabs- 1/2 tab in the PM...  LYMPHOMA>  Managed by DrRichards at [facility];  S/p surg & XRT> notes reviewed- PET was neg, doing well, repeat PET in 875mo planned.  Hx 2 pulm nodules>  No change x yrs on XRays/Scans; as noted PET-CT was neg as well, no changes  in these benign nodules...  CHOL>  on Prav20 & tol well but LDL is up this yr; Lipid Clinic has released her & we plan f/u FLP in 6 months...  GI> GERD, Divrtics, Hx Polyps, Hx Hems>  Follow[facility]Grimm [facility]...  ?Hx Osteopenia>  She had f/u BMD 6/12 that was wnl, normal TScores...  Anxiety>  She has Remeron for prn use  Alopecia>  Followed by Delane Ginger now on Remeron & Neurontin for her itchy burning scalp related to a telogen effluvium... Lipoma on right arm> she is quite anxious about this lesion; she has upcoming appt w/ DrStreck for 4th opinion...  Other medical problems as listed above....   Patient's Medications  New Prescriptions   ALPRAZOLAM (XANAX) 0.5 MG TABLET    Take 1/2 to 1 tablet by mouth three times daily as needed   PRAVASTATIN (PRAVACHOL) 40 MG TABLET    Take 1 tablet (40 mg total) by mouth daily.  Previous Medications   ASPIRIN 81 MG TABLET    Take 81 mg by mouth daily.     BRIMONIDINE-TIMOLOL (COMBIGAN) 0.2-0.5 % OPHTHALMIC SOLUTION    Place 1 drop into both eyes every 12 (twelve) hours.     CHOLECALCIFEROL (VITAMIN D) 1000 UNITS TABLET    Take 1,000 Units by mouth daily.     CYCLOBENZAPRINE (FLEXERIL) 10 MG TABLET    Take 1 tablet (10 mg total) by mouth 3 (three) times daily  as needed for muscle spasms.   DOCUSATE SODIUM (COLACE) 100 MG CAPSULE    Take once daily    LORATADINE (CLARITIN) 10 MG TABLET    Take 10 mg by mouth daily. As needed    MINOXIDIL 5 % FOAM    Apply 3-5 mLs topically at bedtime. Apply to Scalp for hair loss at bedtime    MIRTAZAPINE (REMERON) 15 MG TABLET    Take 7.5 mg by mouth at bedtime.    MULTIPLE MINERALS-VITAMINS (CITRACAL PLUS) TABS    1 tablet by mouth two times daily   MULTIPLE VITAMINS-MINERALS (CENTRUM) TABLET    Take 1 tablet by mouth daily.     MULTIPLE VITAMINS-MINERALS (OCUVITE PRESERVISION PO)    2 by mouth daily    TRETINOIN (RETIN-A) 0.05 % CREAM    Once daily   Modified Medications   Modified Medication Previous Medication   AMLODIPINE (NORVASC) 10 MG TABLET amLODipine (NORVASC) 10 MG tablet      Take 0.5 tablets (5 mg total) by mouth daily.    Take 0.5 tablets (5 mg total) by mouth daily.   ESOMEPRAZOLE (NEXIUM) 40 MG CAPSULE NEXIUM 40 MG capsule      Take 1 capsule (40 mg total) by mouth daily. 30 minutes before the evening meal    TAKE 1 CAPSULE ONCE DAILY 30 MINUTES PRIOR TO EVENING MEAL.   TELMISARTAN-HYDROCHLOROTHIAZIDE (MICARDIS HCT) 80-12.5 MG PER TABLET MICARDIS HCT 80-12.5 MG per tablet      Take 1 tablet by mouth daily.    TAKE 1 TABLET IN THE MORNING.  Discontinued Medications   PRAVACHOL 20 MG TABLET    TAKE 1 TABLET IN THE P.M.   PRAVASTATIN (PRAVACHOL) 20 MG TABLET    Take 1 tablet (20 mg total) by mouth daily.

## 2012-09-30 NOTE — Patient Instructions (Addendum)
Today we updated your med list in our EPIC system...    Continue your current medications the same...  Today we did your follow up FASTING blood work...    We will contact you w/ the results when avail...  Call for any questions or if we can be of service in any way.Marland KitchenMarland Kitchen

## 2012-10-01 LAB — VITAMIN D 25 HYDROXY (VIT D DEFICIENCY, FRACTURES): Vit D, 25-Hydroxy: 42 ng/mL (ref 30–89)

## 2012-10-03 ENCOUNTER — Telehealth: Payer: Self-pay | Admitting: Pulmonary Disease

## 2012-10-03 NOTE — Telephone Encounter (Signed)
lmomtcb Please notify patient> FLP not at goals on Prav20 & worse than last yr> I rec incr Prav40 + better low chol, low fat diet... Chems, CBC, Thyroid> all wnl... Mag, VitD, VitB12> also all wnl... Continue current meds & supplements

## 2012-10-06 NOTE — Telephone Encounter (Signed)
lmtcb x2 

## 2012-10-07 MED ORDER — PRAVASTATIN SODIUM 40 MG PO TABS: 40.0000 mg | ORAL_TABLET | Freq: Every day | ORAL | Status: DC

## 2012-10-07 NOTE — Telephone Encounter (Signed)
Patient is in the lobby to discuss labs.

## 2012-10-07 NOTE — Telephone Encounter (Signed)
Notes Recorded by Marcellus Scott, CMA on 10/02/2012 at 12:06 PM [email or web address] Notes Recorded by Michele Mcalpine, MD on 10/02/2012 at 7:55 AM Please notify patient>  FLP not at goals on Prav20 & worse than last yr> I rec incr Prav40 + better low chol, low fat diet... Chems, CBC, Thyroid> all wnl... Mag, VitD, VitB12> also all wnl... Continue current meds & supplements.Marland KitchenMarland Kitchen

## 2012-10-07 NOTE — Addendum Note (Signed)
Addended by: Gweneth Dimitri D on: 10/07/2012 02:55 PM   Modules accepted: Orders, Medications

## 2012-10-07 NOTE — Telephone Encounter (Signed)
Spoke with pt. Informed her of lab results and recs per Dr. Kriste Basque.  She verbalized understanding of this and would like printed rx for pravastatin 40 mg.  This was printed, signed by SN, and given to pt.  She voiced no further questions or concerns at this time.

## 2012-10-17 ENCOUNTER — Other Ambulatory Visit: Payer: Medicare Other

## 2012-10-22 ENCOUNTER — Telehealth: Payer: Self-pay | Admitting: Family Medicine

## 2012-10-22 ENCOUNTER — Ambulatory Visit: Payer: Medicare Other

## 2012-10-22 ENCOUNTER — Ambulatory Visit (INDEPENDENT_AMBULATORY_CARE_PROVIDER_SITE_OTHER): Payer: Medicare Other | Admitting: Family Medicine

## 2012-10-22 VITALS — BP 120/81 | HR 69 | Temp 98.5°F | Resp 14 | Ht 63.5 in | Wt 159.0 lb

## 2012-10-22 DIAGNOSIS — W009XXA Unspecified fall due to ice and snow, initial encounter: Secondary | ICD-10-CM

## 2012-10-22 DIAGNOSIS — M25569 Pain in unspecified knee: Secondary | ICD-10-CM

## 2012-10-22 DIAGNOSIS — M25561 Pain in right knee: Secondary | ICD-10-CM

## 2012-10-22 NOTE — Patient Instructions (Addendum)
Let me know if your knees continue to bother you- if need be we can have you see your orthopedist.  Let me know sooner if you are getting worse or having any change in your symptoms.

## 2012-10-22 NOTE — Telephone Encounter (Signed)
Called Pt to notify her that her Bil knee xrays were negative. (Rt knee- under kneecap has arthritis and Lt knee shows no loose body- just mild arthritis) Pt voiced understanding and thanked me for calling her. Eileen Stanford

## 2012-10-22 NOTE — Progress Notes (Signed)
[facility] [address], [place] [address] [phone]  Date:  10/22/2012   Name:  Colleen White   DOB:  April 25, 1946   MRN:  [id number]  PCP:  Michele Mcalpine, MD    Chief Complaint: Fall and Knee Pain   History of Present Illness:  Colleen White is a 67 y.o. very pleasant female patient who presents with the following:  Patient with many medical problems who is here today- on 2/13 she fell on the black ice.  She landed on her left hip, but somehow may have also hurt her knees. She thinks she may have twisted them, but did not fall on her knees directly The right knee did not hurt right away, but the left did. The right knee started to hurt about a week later.  The knees hurt with walking, but not at rest.  Not worse with stairs or hills.  The pain is fluctuating and sometimes seems to be getting better She does hear popping but this is not new.  No locking up. No instability.   Her hip is now ok- although she fell on it she was wearing a heavy coat and it did not hurt.  She has not used any particular therapy except for ice packs and ibuprofen.    Patient Active Problem List  Diagnosis  . HYPERCHOLESTEROLEMIA  . GLAUCOMA  . HYPERTENSION  . MITRAL VALVE PROLAPSE  . HEMORRHOIDS  . ALLERGIC RHINITIS  . PULMONARY NODULE  . GERD  . DIVERTICULOSIS OF COLON  . FIBROCYSTIC BREAST DISEASE  . ALOPECIA  . OSTEOPENIA  . HEADACHE  . CHEST PAIN, ACUTE  . LYMPHOMA  . COLONIC POLYPS  . ANXIETY  . Family history of breast cancer  . Radiculopathy  . Bronchitis, acute    Past Medical History  Diagnosis Date  . Other malignant lymphomas, unspecified site, extranodal and solid organ sites   . Unspecified glaucoma(365.9)   . Allergic rhinitis, cause unspecified   . Pulmonary nodule   . Unspecified essential hypertension   . Mitral valve disorders   . Pure hypercholesterolemia   . Esophageal reflux   . Diverticulosis of colon (without mention of  hemorrhage)   . Colon polyps   . Hemorrhoids   . Fibrocystic breast disease   . Osteopenia   . Anxiety   . Alopecia   . Headache   . Acid reflux   . High cholesterol     Past Surgical History  Procedure Laterality Date  . S/p tah/bso    . S/p orif prox phalanx  02/2007    right little finger----Dr Merlyn Lot  . 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 Chauncey Mann, [facility]  . Abdominal hysterectomy  1996  . Eye surgery  2009    Cataract    History  Substance Use Topics  . Smoking status: Former Smoker    Quit date: 08/27/1974  . Smokeless tobacco: Never Used  . Alcohol Use: No     Comment: occasionally    Family History  Problem Relation Age of Onset  . Hypertension Mother   . Cancer Father     throat  . Cancer Sister     breast (sister)    No Known Allergies  Medication list has been reviewed and updated.  Current Outpatient Prescriptions on File Prior to Visit  Medication Sig Dispense Refill  . amLODipine (NORVASC) 10 MG tablet Take 0.5 tablets (5 mg total) by mouth  daily.  30 tablet  11  . aspirin 81 MG tablet Take 81 mg by mouth daily.        . brimonidine-timolol (COMBIGAN) 0.2-0.5 % ophthalmic solution Place 1 drop into both eyes every 12 (twelve) hours.        . cholecalciferol (VITAMIN D) 1000 UNITS tablet Take 1,000 Units by mouth daily.        . cyclobenzaprine (FLEXERIL) 10 MG tablet Take 1 tablet (10 mg total) by mouth 3 (three) times daily as needed for muscle spasms.  50 tablet  3  . docusate sodium (COLACE) 100 MG capsule Take once daily       . esomeprazole (NEXIUM) 40 MG capsule Take 1 capsule (40 mg total) by mouth daily. 30 minutes before the evening meal  30 capsule  11  . loratadine (CLARITIN) 10 MG tablet Take 10 mg by mouth daily. As needed       . Minoxidil 5 % FOAM Apply 3-5 mLs topically at bedtime. Apply to Scalp for hair loss at bedtime       . mirtazapine (REMERON) 15 MG tablet Take 7.5 mg by mouth at bedtime.       .  Multiple Vitamins-Minerals (CENTRUM) tablet Take 1 tablet by mouth daily.        . Multiple Vitamins-Minerals (OCUVITE PRESERVISION PO) 2 by mouth daily       . pravastatin (PRAVACHOL) 40 MG tablet Take 1 tablet (40 mg total) by mouth daily.  30 tablet  11  . telmisartan-hydrochlorothiazide (MICARDIS HCT) 80-12.5 MG per tablet Take 1 tablet by mouth daily.  30 tablet  11  . tretinoin (RETIN-A) 0.05 % cream Once daily       . Multiple Minerals-Vitamins (CITRACAL PLUS) TABS 1 tablet by mouth two times daily       No current facility-administered medications on file prior to visit.    Review of Systems:  As per HPI- otherwise negative. She is not aware of any other joint problems or of any history of arthritis  Physical Examination: Filed Vitals:   10/22/12 0912  BP: 120/81  Pulse: 69  Temp: 98.5 F (36.9 C)  Resp: 14   Filed Vitals:   10/22/12 0912  Height: 5' 3.5" (1.613 m)  Weight: 159 lb (72.122 kg)   Body mass index is 27.72 kg/(m^2). Ideal Body Weight: Weight in (lb) to have BMI = 25: 143.1  GEN: WDWN, NAD, Non-toxic, A & O x 3, healthy appearing HEENT: Atraumatic, Normocephalic. Neck supple. No masses, No LAD. Ears and Nose: No external deformity. CV: RRR, No M/G/R. No JVD. No thrill. No extra heart sounds. PULM: CTA B, no wheezes, crackles, rhonchi. No retractions. No resp. distress. No accessory muscle use. EXTR: No c/c/e NEURO Normal gait.  PSYCH: Normally interactive. Conversant. Not depressed or anxious appearing.  Calm demeanor.  Right knee; minimal crepitus, especially under patella.  Stable, no varus or valgus laxity, negative anterior drawer Left knee: minimal crepitus, patella moves smoothly.  Stable, no varus or valgus laxity, negative anterior drawer.   No heat, swelling, or redness of either knee, quad and hamstring strength normal, patellar DTR 2+ and equal bilaterally   [facility] reading (PRIMARY) by  Dr. Patsy Lager. Right knee:mild degenerative change Left  knee: minimal degenerative change, possible loose body  LEFT KNEE - COMPLETE 4+ VIEW  Comparison: None.  Findings: Four views of the left knee submitted. No acute fracture or subluxation. Mild narrowing of medial joint compartment. There is narrowing of  patellofemoral joint space. No joint effusion.  IMPRESSION: No acute fracture or subluxation. Mild degenerative changes.  RIGHT KNEE - COMPLETE 4+ VIEW  Comparison: None.  Findings: Four views of the right knee submitted. There is narrowing of medial joint compartment. Narrowing of patellofemoral joint space. No acute fracture or subluxation. No joint effusion. Mild spurring of the patella.  IMPRESSION: No acute fracture or subluxation. Degenerative changes as described above.  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  She is established with an orthopedist and may follow- up with them if symptoms persist.  She is not overly bothered by pain today but wanted to be sure nothing was broken.  Reassured there is no sign of fracture, and she will continue to use OTC pain relievers as needed, contact her orthopedist for an evaluation if her symptoms persist.   Abbe Amsterdam, MD

## 2012-11-03 DIAGNOSIS — Z961 Presence of intraocular lens: Secondary | ICD-10-CM | POA: Diagnosis not present

## 2012-11-03 DIAGNOSIS — H40019 Open angle with borderline findings, low risk, unspecified eye: Secondary | ICD-10-CM | POA: Diagnosis not present

## 2012-11-03 DIAGNOSIS — H40059 Ocular hypertension, unspecified eye: Secondary | ICD-10-CM | POA: Diagnosis not present

## 2012-11-05 ENCOUNTER — Other Ambulatory Visit: Payer: Self-pay | Admitting: Pulmonary Disease

## 2012-11-05 MED ORDER — ALPRAZOLAM 0.5 MG PO TABS: ORAL_TABLET | ORAL | Status: DC

## 2012-11-19 DIAGNOSIS — D179 Benign lipomatous neoplasm, unspecified: Secondary | ICD-10-CM | POA: Diagnosis not present

## 2012-11-19 DIAGNOSIS — Z79899 Other long term (current) drug therapy: Secondary | ICD-10-CM | POA: Diagnosis not present

## 2012-11-19 DIAGNOSIS — D492 Neoplasm of unspecified behavior of bone, soft tissue, and skin: Secondary | ICD-10-CM | POA: Diagnosis not present

## 2012-11-24 DIAGNOSIS — D1739 Benign lipomatous neoplasm of skin and subcutaneous tissue of other sites: Secondary | ICD-10-CM | POA: Diagnosis not present

## 2012-11-24 DIAGNOSIS — D1779 Benign lipomatous neoplasm of other sites: Secondary | ICD-10-CM | POA: Diagnosis not present

## 2012-11-26 ENCOUNTER — Telehealth: Payer: Self-pay | Admitting: Pulmonary Disease

## 2012-11-26 DIAGNOSIS — K649 Unspecified hemorrhoids: Secondary | ICD-10-CM

## 2012-11-26 DIAGNOSIS — K5730 Diverticulosis of large intestine without perforation or abscess without bleeding: Secondary | ICD-10-CM

## 2012-11-26 NOTE — Telephone Encounter (Signed)
LMOMTCB x 1 

## 2012-11-28 NOTE — Telephone Encounter (Signed)
Spoke with patient, patient states she bought card in for hemoccult testing and dropped it off in the lab approx. 6 weeks ago Patient inquiring on results of this. I do not see any results, any order, any message of this. Spoke with lab-Vicki- to see if they see something I can not, and she states she does not see any evidence of this either Leigh can you please advise if you know anything about this.

## 2012-12-01 NOTE — Telephone Encounter (Signed)
Called and spoke with pt and she stated that she did the stool cards after her OV with SN in feb.   Pt stated that she thinks this was mid feb.  She dropped these off at the front desk in the lab.  Pt is aware that the lab stated they do not have these results.  Pt stated that she will come by this week and repeat these cards.  Cards have been left out front for the pt to come by and pick up. Order has been placed and nothing further is needed.

## 2012-12-01 NOTE — Addendum Note (Signed)
Addended by: Elie Confer on: 12/01/2012 09:21 AM   Modules accepted: Orders

## 2012-12-16 ENCOUNTER — Other Ambulatory Visit: Payer: Self-pay | Admitting: Pulmonary Disease

## 2012-12-31 ENCOUNTER — Other Ambulatory Visit: Payer: Self-pay | Admitting: Pulmonary Disease

## 2012-12-31 DIAGNOSIS — E7800 Pure hypercholesterolemia, unspecified: Secondary | ICD-10-CM

## 2013-01-13 DIAGNOSIS — R911 Solitary pulmonary nodule: Secondary | ICD-10-CM | POA: Diagnosis not present

## 2013-01-13 DIAGNOSIS — R599 Enlarged lymph nodes, unspecified: Secondary | ICD-10-CM | POA: Diagnosis not present

## 2013-01-13 DIAGNOSIS — C8299 Follicular lymphoma, unspecified, extranodal and solid organ sites: Secondary | ICD-10-CM | POA: Diagnosis not present

## 2013-01-13 DIAGNOSIS — C8589 Other specified types of non-Hodgkin lymphoma, extranodal and solid organ sites: Secondary | ICD-10-CM | POA: Diagnosis not present

## 2013-01-27 DIAGNOSIS — L2089 Other atopic dermatitis: Secondary | ICD-10-CM | POA: Diagnosis not present

## 2013-01-30 ENCOUNTER — Encounter: Payer: Self-pay | Admitting: Pulmonary Disease

## 2013-02-20 ENCOUNTER — Ambulatory Visit: Payer: Medicare Other | Admitting: Adult Health

## 2013-03-03 DIAGNOSIS — M545 Low back pain, unspecified: Secondary | ICD-10-CM | POA: Diagnosis not present

## 2013-03-15 ENCOUNTER — Ambulatory Visit (INDEPENDENT_AMBULATORY_CARE_PROVIDER_SITE_OTHER): Payer: Medicare Other | Admitting: Emergency Medicine

## 2013-03-15 VITALS — BP 143/83 | HR 101 | Temp 98.2°F | Resp 16 | Ht 63.0 in | Wt 159.0 lb

## 2013-03-15 DIAGNOSIS — R6884 Jaw pain: Secondary | ICD-10-CM | POA: Diagnosis not present

## 2013-03-15 DIAGNOSIS — K047 Periapical abscess without sinus: Secondary | ICD-10-CM | POA: Diagnosis not present

## 2013-03-15 MED ORDER — CLINDAMYCIN HCL 300 MG PO CAPS: 300.0000 mg | ORAL_CAPSULE | Freq: Three times a day (TID) | ORAL | Status: DC

## 2013-03-15 MED ORDER — PENICILLIN V POTASSIUM 500 MG PO TABS: 500.0000 mg | ORAL_TABLET | Freq: Four times a day (QID) | ORAL | Status: DC

## 2013-03-15 NOTE — Patient Instructions (Addendum)
°  Dental Abscess °A dental abscess is a collection of infected fluid (pus) from a bacterial infection in the inner part of the tooth (pulp). It usually occurs at the end of the tooth's root.  °CAUSES  °· Severe tooth decay. °· Trauma to the tooth that allows bacteria to enter into the pulp, such as a broken or chipped tooth. °SYMPTOMS  °· Severe pain in and around the infected tooth. °· Swelling and redness around the abscessed tooth or in the mouth or face. °· Tenderness. °· Pus drainage. °· Bad breath. °· Bitter taste in the mouth. °· Difficulty swallowing. °· Difficulty opening the mouth. °· Nausea. °· Vomiting. °· Chills. °· Swollen neck glands. °DIAGNOSIS  °· A medical and dental history will be taken. °· An examination will be performed by tapping on the abscessed tooth. °· X-rays may be taken of the tooth to identify the abscess. °TREATMENT °The goal of treatment is to eliminate the infection. You may be prescribed antibiotic medicine to stop the infection from spreading. A root canal may be performed to save the tooth. If the tooth cannot be saved, it may be pulled (extracted) and the abscess may be drained.  °HOME CARE INSTRUCTIONS °· Only take over-the-counter or prescription medicines for pain, fever, or discomfort as directed by your caregiver. °· Rinse your mouth (gargle) often with salt water (¼ tsp salt in 8 oz [250 ml] of warm water) to relieve pain or swelling. °· Do not drive after taking pain medicine (narcotics). °· Do not apply heat to the outside of your face. °· Return to your dentist for further treatment as directed. °SEEK MEDICAL CARE IF: °· Your pain is not helped by medicine. °· Your pain is getting worse instead of better. °SEEK IMMEDIATE MEDICAL CARE IF: °· You have a fever or persistent symptoms for more than 2 3 days. °· You have a fever and your symptoms suddenly get worse. °· You have chills or a very bad headache. °· You have problems breathing or swallowing. °· You have trouble  opening your mouth. °· You have swelling in the neck or around the eye. °Document Released: 08/13/2005 Document Revised: 05/07/2012 Document Reviewed: 11/21/2010 °Exit[employer]® Patient Information ©2014 [employer]. ° ° °

## 2013-03-15 NOTE — Progress Notes (Signed)
[facility] [address], [place] [address] [phone]  Date:  03/15/2013   Name:  DUANE TRIAS   DOB:  1946/06/25   MRN:  [id number]  PCP:  Michele Mcalpine, MD    Chief Complaint: mouth abcess and Adenopathy   History of Present Illness:  Colleen White is a 67 y.o. very pleasant female patient who presents with the following:  36 hour duration painful mass in left upper jaw and roof of mouth. Saw a dentist this week with complaint of having her bite "off".  He did a procedure and she now has a painful mass.  No improvement with over the counter medications or other home remedies. Denies other complaint or health concern today.   Patient Active Problem List   Diagnosis Date Noted  . Bronchitis, acute 07/10/2012  . Radiculopathy 09/11/2011  . Family history of breast cancer 05/17/2011  . COLONIC POLYPS 10/21/2010  . ANXIETY 10/21/2010  . LYMPHOMA 10/20/2010  . GLAUCOMA 08/04/2008  . ALLERGIC RHINITIS 12/17/2007  . PULMONARY NODULE 12/17/2007  . DIVERTICULOSIS OF COLON 12/17/2007  . FIBROCYSTIC BREAST DISEASE 12/17/2007  . ALOPECIA 12/17/2007  . OSTEOPENIA 12/17/2007  . CHEST PAIN, ACUTE 12/17/2007  . HYPERCHOLESTEROLEMIA 07/31/2007  . HYPERTENSION 07/31/2007  . MITRAL VALVE PROLAPSE 07/31/2007  . HEMORRHOIDS 07/31/2007  . GERD 07/31/2007  . HEADACHE 07/31/2007    Past Medical History  Diagnosis Date  . Other malignant lymphomas, unspecified site, extranodal and solid organ sites   . Unspecified glaucoma(365.9)   . Allergic rhinitis, cause unspecified   . Pulmonary nodule   . Unspecified essential hypertension   . Mitral valve disorders   . Pure hypercholesterolemia   . Esophageal reflux   . Diverticulosis of colon (without mention of hemorrhage)   . Colon polyps   . Hemorrhoids   . Fibrocystic breast disease   . Osteopenia   . Anxiety   . Alopecia   . Headache(784.0)   . Acid reflux   . High cholesterol     Past  Surgical History  Procedure Laterality Date  . S/p tah/bso    . S/p orif prox phalanx  02/2007    right little finger----Dr Merlyn Lot  . 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 Chauncey Mann, [facility]  . Abdominal hysterectomy  1996  . Eye surgery  2009    Cataract    History  Substance Use Topics  . Smoking status: Former Smoker    Quit date: 08/27/1974  . Smokeless tobacco: Never Used  . Alcohol Use: No     Comment: occasionally    Family History  Problem Relation Age of Onset  . Hypertension Mother   . Cancer Father     throat  . Cancer Sister     breast (sister)    No Known Allergies  Medication list has been reviewed and updated.  Current Outpatient Prescriptions on File Prior to Visit  Medication Sig Dispense Refill  . ALPRAZolam (XANAX) 0.5 MG tablet Take 1/2 to 1 tablet by mouth three times daily as needed  90 tablet  5  . amLODipine (NORVASC) 10 MG tablet Take 0.5 tablets (5 mg total) by mouth daily.  30 tablet  11  . aspirin 81 MG tablet Take 81 mg by mouth daily.        . brimonidine-timolol (COMBIGAN) 0.2-0.5 % ophthalmic solution Place 1 drop into both eyes every 12 (twelve) hours.        [name]  cholecalciferol (VITAMIN D) 1000 UNITS tablet Take 1,000 Units by mouth daily.        . cyclobenzaprine (FLEXERIL) 10 MG tablet TAKE 1 TABLET THREE TIMES DAILY AS NEEDED FOR MUSCLE SPASMS.  50 tablet  5  . docusate sodium (COLACE) 100 MG capsule Take once daily       . esomeprazole (NEXIUM) 40 MG capsule Take 1 capsule (40 mg total) by mouth daily. 30 minutes before the evening meal  30 capsule  11  . loratadine (CLARITIN) 10 MG tablet Take 10 mg by mouth daily. As needed       . Minoxidil 5 % FOAM Apply 3-5 mLs topically at bedtime. Apply to Scalp for hair loss at bedtime       . mirtazapine (REMERON) 15 MG tablet Take 7.5 mg by mouth at bedtime.       . Multiple Vitamins-Minerals (CENTRUM) tablet Take 1 tablet by mouth daily.        . pravastatin  (PRAVACHOL) 40 MG tablet Take 1 tablet (40 mg total) by mouth daily.  30 tablet  11  . telmisartan-hydrochlorothiazide (MICARDIS HCT) 80-12.5 MG per tablet Take 1 tablet by mouth daily.  30 tablet  11  . tretinoin (RETIN-A) 0.05 % cream Once daily        No current facility-administered medications on file prior to visit.    Review of Systems:  As per HPI, otherwise negative.    Physical Examination: Filed Vitals:   03/15/13 1608  BP: 143/83  Pulse: 101  Temp: 98.2 F (36.8 C)  Resp: 16   Filed Vitals:   03/15/13 1608  Height: 5\' 3"  (1.6 m)  Weight: 159 lb (72.122 kg)   Body mass index is 28.17 kg/(m^2). Ideal Body Weight: Weight in (lb) to have BMI = 25: 140.8   GEN: WDWN, NAD, Non-toxic, Alert & Oriented x 3 HEENT: Atraumatic, Normocephalic.  Ears and Nose: No external deformity. EXTR: No clubbing/cyanosis/edema NEURO: Normal gait.  PSYCH: Normally interactive. Conversant. Not depressed or anxious appearing.  Calm demeanor.  MOUTH:  Mass on left roof of mouth no drainage.   NECK  Tender swollen nodes anterior chain.  Assessment and Plan: Dental abscess Dentist Penicillin Clindamycin  Signed,  Phillips Odor, MD

## 2013-04-02 ENCOUNTER — Other Ambulatory Visit: Payer: Self-pay | Admitting: Obstetrics & Gynecology

## 2013-04-02 DIAGNOSIS — Z1231 Encounter for screening mammogram for malignant neoplasm of breast: Secondary | ICD-10-CM

## 2013-04-02 DIAGNOSIS — R922 Inconclusive mammogram: Secondary | ICD-10-CM

## 2013-04-06 ENCOUNTER — Other Ambulatory Visit (INDEPENDENT_AMBULATORY_CARE_PROVIDER_SITE_OTHER): Payer: Medicare Other

## 2013-04-06 DIAGNOSIS — E7800 Pure hypercholesterolemia, unspecified: Secondary | ICD-10-CM | POA: Diagnosis not present

## 2013-04-06 DIAGNOSIS — K649 Unspecified hemorrhoids: Secondary | ICD-10-CM

## 2013-04-06 DIAGNOSIS — H40059 Ocular hypertension, unspecified eye: Secondary | ICD-10-CM | POA: Diagnosis not present

## 2013-04-06 DIAGNOSIS — K5730 Diverticulosis of large intestine without perforation or abscess without bleeding: Secondary | ICD-10-CM

## 2013-04-06 LAB — LIPID PANEL
Cholesterol: 146 mg/dL (ref 0–200)
HDL: 35.3 mg/dL — ABNORMAL LOW (ref >39.00)
LDL Cholesterol: 89 mg/dL (ref 0–99)
Total CHOL/HDL Ratio: 4
Triglycerides: 111 mg/dL (ref 0.0–149.0)
VLDL: 22.2 mg/dL (ref 0.0–40.0)

## 2013-04-20 ENCOUNTER — Telehealth: Payer: Self-pay | Admitting: Pulmonary Disease

## 2013-04-20 NOTE — Telephone Encounter (Signed)
Pt called back and she is aware of lab results per SN.  Pt voiced her understanding and nothing further is needed.  

## 2013-05-15 DIAGNOSIS — H04129 Dry eye syndrome of unspecified lacrimal gland: Secondary | ICD-10-CM | POA: Diagnosis not present

## 2013-05-15 DIAGNOSIS — H40059 Ocular hypertension, unspecified eye: Secondary | ICD-10-CM | POA: Diagnosis not present

## 2013-05-15 DIAGNOSIS — H25049 Posterior subcapsular polar age-related cataract, unspecified eye: Secondary | ICD-10-CM | POA: Diagnosis not present

## 2013-05-15 DIAGNOSIS — Z961 Presence of intraocular lens: Secondary | ICD-10-CM | POA: Diagnosis not present

## 2013-05-21 ENCOUNTER — Ambulatory Visit
Admission: RE | Admit: 2013-05-21 | Discharge: 2013-05-21 | Disposition: A | Discharge status: Home / Self Care | Payer: Medicare Other | Source: Ambulatory Visit | Attending: Obstetrics & Gynecology | Admitting: Obstetrics & Gynecology

## 2013-05-21 DIAGNOSIS — Z1231 Encounter for screening mammogram for malignant neoplasm of breast: Secondary | ICD-10-CM

## 2013-05-21 DIAGNOSIS — Z803 Family history of malignant neoplasm of breast: Secondary | ICD-10-CM | POA: Diagnosis not present

## 2013-05-25 ENCOUNTER — Ambulatory Visit
Admission: RE | Admit: 2013-05-25 | Discharge: 2013-05-25 | Disposition: A | Discharge status: Home / Self Care | Payer: Medicare Other | Source: Ambulatory Visit | Attending: Obstetrics & Gynecology | Admitting: Obstetrics & Gynecology

## 2013-05-25 ENCOUNTER — Other Ambulatory Visit: Payer: Medicare Other

## 2013-05-25 DIAGNOSIS — Z923 Personal history of irradiation: Secondary | ICD-10-CM | POA: Diagnosis not present

## 2013-05-25 DIAGNOSIS — C8293 Follicular lymphoma, unspecified, intra-abdominal lymph nodes: Secondary | ICD-10-CM | POA: Diagnosis not present

## 2013-05-25 DIAGNOSIS — K625 Hemorrhage of anus and rectum: Secondary | ICD-10-CM | POA: Diagnosis not present

## 2013-05-25 DIAGNOSIS — R922 Inconclusive mammogram: Secondary | ICD-10-CM

## 2013-05-25 DIAGNOSIS — Z8719 Personal history of other diseases of the digestive system: Secondary | ICD-10-CM | POA: Diagnosis not present

## 2013-05-25 DIAGNOSIS — Z86010 Personal history of colonic polyps: Secondary | ICD-10-CM | POA: Diagnosis not present

## 2013-05-25 DIAGNOSIS — N63 Unspecified lump in unspecified breast: Secondary | ICD-10-CM | POA: Diagnosis not present

## 2013-05-25 LAB — HM MAMMOGRAPHY: HM Mammogram: ABNORMAL

## 2013-05-25 MED ORDER — GADOBENATE DIMEGLUMINE 529 MG/ML IV SOLN
15.0000 mL | Freq: Once | INTRAVENOUS | Status: AC | PRN
  Administered 2013-05-25: 15 mL via INTRAVENOUS

## 2013-05-27 ENCOUNTER — Other Ambulatory Visit: Payer: Self-pay | Admitting: Obstetrics & Gynecology

## 2013-05-27 DIAGNOSIS — R928 Other abnormal and inconclusive findings on diagnostic imaging of breast: Secondary | ICD-10-CM

## 2013-05-28 DIAGNOSIS — N644 Mastodynia: Secondary | ICD-10-CM | POA: Diagnosis not present

## 2013-05-28 DIAGNOSIS — R300 Dysuria: Secondary | ICD-10-CM | POA: Diagnosis not present

## 2013-05-28 DIAGNOSIS — Z803 Family history of malignant neoplasm of breast: Secondary | ICD-10-CM | POA: Diagnosis not present

## 2013-06-04 DIAGNOSIS — Z1231 Encounter for screening mammogram for malignant neoplasm of breast: Secondary | ICD-10-CM | POA: Diagnosis not present

## 2013-06-05 DIAGNOSIS — N63 Unspecified lump in unspecified breast: Secondary | ICD-10-CM | POA: Diagnosis not present

## 2013-06-05 DIAGNOSIS — Z803 Family history of malignant neoplasm of breast: Secondary | ICD-10-CM | POA: Diagnosis not present

## 2013-06-11 ENCOUNTER — Other Ambulatory Visit: Payer: Medicare Other

## 2013-06-11 DIAGNOSIS — N63 Unspecified lump in unspecified breast: Secondary | ICD-10-CM | POA: Diagnosis not present

## 2013-06-11 DIAGNOSIS — Z803 Family history of malignant neoplasm of breast: Secondary | ICD-10-CM | POA: Diagnosis not present

## 2013-06-11 DIAGNOSIS — Z7982 Long term (current) use of aspirin: Secondary | ICD-10-CM | POA: Diagnosis not present

## 2013-06-11 DIAGNOSIS — IMO0002 Reserved for concepts with insufficient information to code with codable children: Secondary | ICD-10-CM | POA: Diagnosis not present

## 2013-06-11 DIAGNOSIS — R928 Other abnormal and inconclusive findings on diagnostic imaging of breast: Secondary | ICD-10-CM | POA: Diagnosis not present

## 2013-06-11 DIAGNOSIS — C8589 Other specified types of non-Hodgkin lymphoma, extranodal and solid organ sites: Secondary | ICD-10-CM | POA: Diagnosis not present

## 2013-06-11 DIAGNOSIS — N6019 Diffuse cystic mastopathy of unspecified breast: Secondary | ICD-10-CM | POA: Diagnosis not present

## 2013-06-16 DIAGNOSIS — R928 Other abnormal and inconclusive findings on diagnostic imaging of breast: Secondary | ICD-10-CM | POA: Diagnosis not present

## 2013-06-18 DIAGNOSIS — R928 Other abnormal and inconclusive findings on diagnostic imaging of breast: Secondary | ICD-10-CM | POA: Insufficient documentation

## 2013-06-24 ENCOUNTER — Telehealth: Payer: Self-pay | Admitting: Pulmonary Disease

## 2013-06-24 DIAGNOSIS — M545 Low back pain, unspecified: Secondary | ICD-10-CM

## 2013-06-24 NOTE — Telephone Encounter (Signed)
Letter Coped verbatim:     June 23, 2013 Attn:  Dr. Teressa Lower Re:  Colleen White DOB 2046-04-13 (H) [phone] (C) [phone]  I have been having backache and pain in right leg for the past 6-8 weeks.  I have called Dr. Sherley Bounds, Neurosurgeon, yesterday.  His office needs for you to fax a referral sheet to them giving a brief medicaion hsitory on me for the past two years, insurance information, etc.  The office said to mention on the form that I have been having backache and pain in the right leg for 6-8 weeks.   The office cannot make an appointment until they receive this information.  Many thanks for your help. Colleen White  Satira Anis

## 2013-06-24 NOTE — Telephone Encounter (Signed)
Order has been placed for pt to get this appt.

## 2013-07-13 DIAGNOSIS — IMO0002 Reserved for concepts with insufficient information to code with codable children: Secondary | ICD-10-CM | POA: Diagnosis not present

## 2013-07-13 DIAGNOSIS — Q762 Congenital spondylolisthesis: Secondary | ICD-10-CM | POA: Diagnosis not present

## 2013-07-14 ENCOUNTER — Other Ambulatory Visit: Payer: Self-pay | Admitting: Neurological Surgery

## 2013-07-14 DIAGNOSIS — R7401 Elevation of levels of liver transaminase levels: Secondary | ICD-10-CM | POA: Diagnosis not present

## 2013-07-14 DIAGNOSIS — Z79899 Other long term (current) drug therapy: Secondary | ICD-10-CM | POA: Diagnosis not present

## 2013-07-14 DIAGNOSIS — K219 Gastro-esophageal reflux disease without esophagitis: Secondary | ICD-10-CM | POA: Diagnosis not present

## 2013-07-14 DIAGNOSIS — Z23 Encounter for immunization: Secondary | ICD-10-CM | POA: Diagnosis not present

## 2013-07-14 DIAGNOSIS — D72810 Lymphocytopenia: Secondary | ICD-10-CM | POA: Diagnosis not present

## 2013-07-14 DIAGNOSIS — R911 Solitary pulmonary nodule: Secondary | ICD-10-CM | POA: Diagnosis not present

## 2013-07-14 DIAGNOSIS — R634 Abnormal weight loss: Secondary | ICD-10-CM | POA: Diagnosis not present

## 2013-07-14 DIAGNOSIS — Z1239 Encounter for other screening for malignant neoplasm of breast: Secondary | ICD-10-CM | POA: Diagnosis not present

## 2013-07-14 DIAGNOSIS — E7800 Pure hypercholesterolemia, unspecified: Secondary | ICD-10-CM | POA: Diagnosis not present

## 2013-07-14 DIAGNOSIS — C8299 Follicular lymphoma, unspecified, extranodal and solid organ sites: Secondary | ICD-10-CM | POA: Diagnosis not present

## 2013-07-14 DIAGNOSIS — M5416 Radiculopathy, lumbar region: Secondary | ICD-10-CM

## 2013-07-14 DIAGNOSIS — F411 Generalized anxiety disorder: Secondary | ICD-10-CM | POA: Diagnosis not present

## 2013-07-14 DIAGNOSIS — I10 Essential (primary) hypertension: Secondary | ICD-10-CM | POA: Diagnosis not present

## 2013-07-14 DIAGNOSIS — R7402 Elevation of levels of lactic acid dehydrogenase (LDH): Secondary | ICD-10-CM | POA: Diagnosis not present

## 2013-07-22 ENCOUNTER — Ambulatory Visit
Admission: RE | Admit: 2013-07-22 | Discharge: 2013-07-22 | Disposition: A | Discharge status: Home / Self Care | Payer: Medicare Other | Source: Ambulatory Visit | Attending: Neurological Surgery | Admitting: Neurological Surgery

## 2013-07-22 DIAGNOSIS — M5416 Radiculopathy, lumbar region: Secondary | ICD-10-CM

## 2013-07-22 DIAGNOSIS — M47817 Spondylosis without myelopathy or radiculopathy, lumbosacral region: Secondary | ICD-10-CM | POA: Diagnosis not present

## 2013-07-22 DIAGNOSIS — M5137 Other intervertebral disc degeneration, lumbosacral region: Secondary | ICD-10-CM | POA: Diagnosis not present

## 2013-07-27 ENCOUNTER — Other Ambulatory Visit: Payer: Medicare Other

## 2013-07-28 DIAGNOSIS — IMO0002 Reserved for concepts with insufficient information to code with codable children: Secondary | ICD-10-CM | POA: Diagnosis not present

## 2013-07-28 DIAGNOSIS — M5417 Radiculopathy, lumbosacral region: Secondary | ICD-10-CM | POA: Insufficient documentation

## 2013-07-30 DIAGNOSIS — M5126 Other intervertebral disc displacement, lumbar region: Secondary | ICD-10-CM | POA: Diagnosis not present

## 2013-07-30 DIAGNOSIS — M47817 Spondylosis without myelopathy or radiculopathy, lumbosacral region: Secondary | ICD-10-CM | POA: Diagnosis not present

## 2013-08-20 IMAGING — CR DG KNEE COMPLETE 4+V*R*
4 series · 4 of 4 positions shown · non-contrast
Comparison: None.

CLINICAL DATA: Knee pain post fall

RIGHT KNEE - COMPLETE 4+ VIEW

[AP]
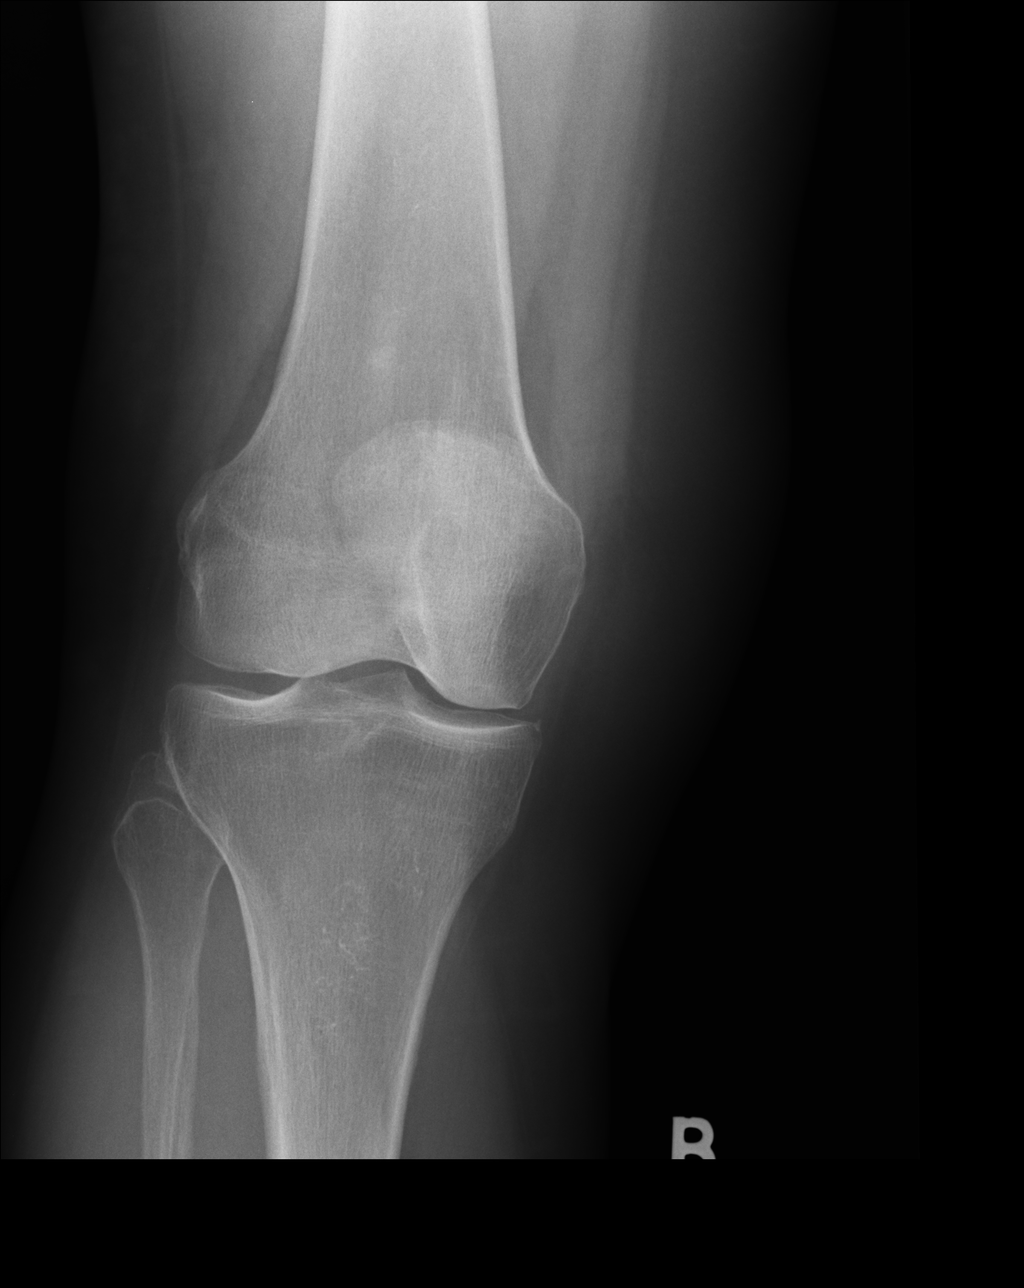

[lateral]
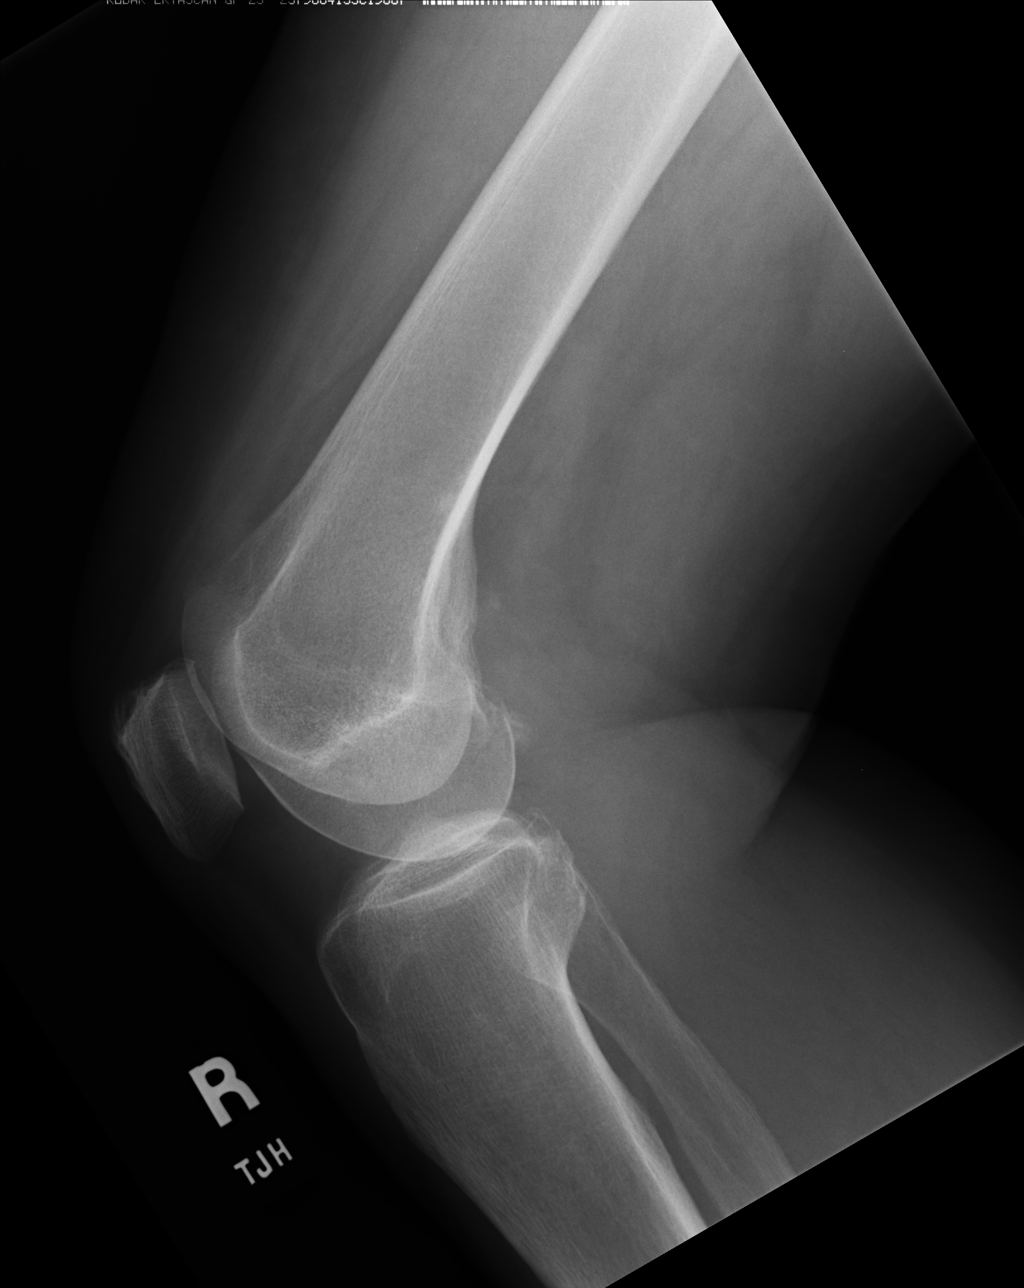

[ap ext rot]
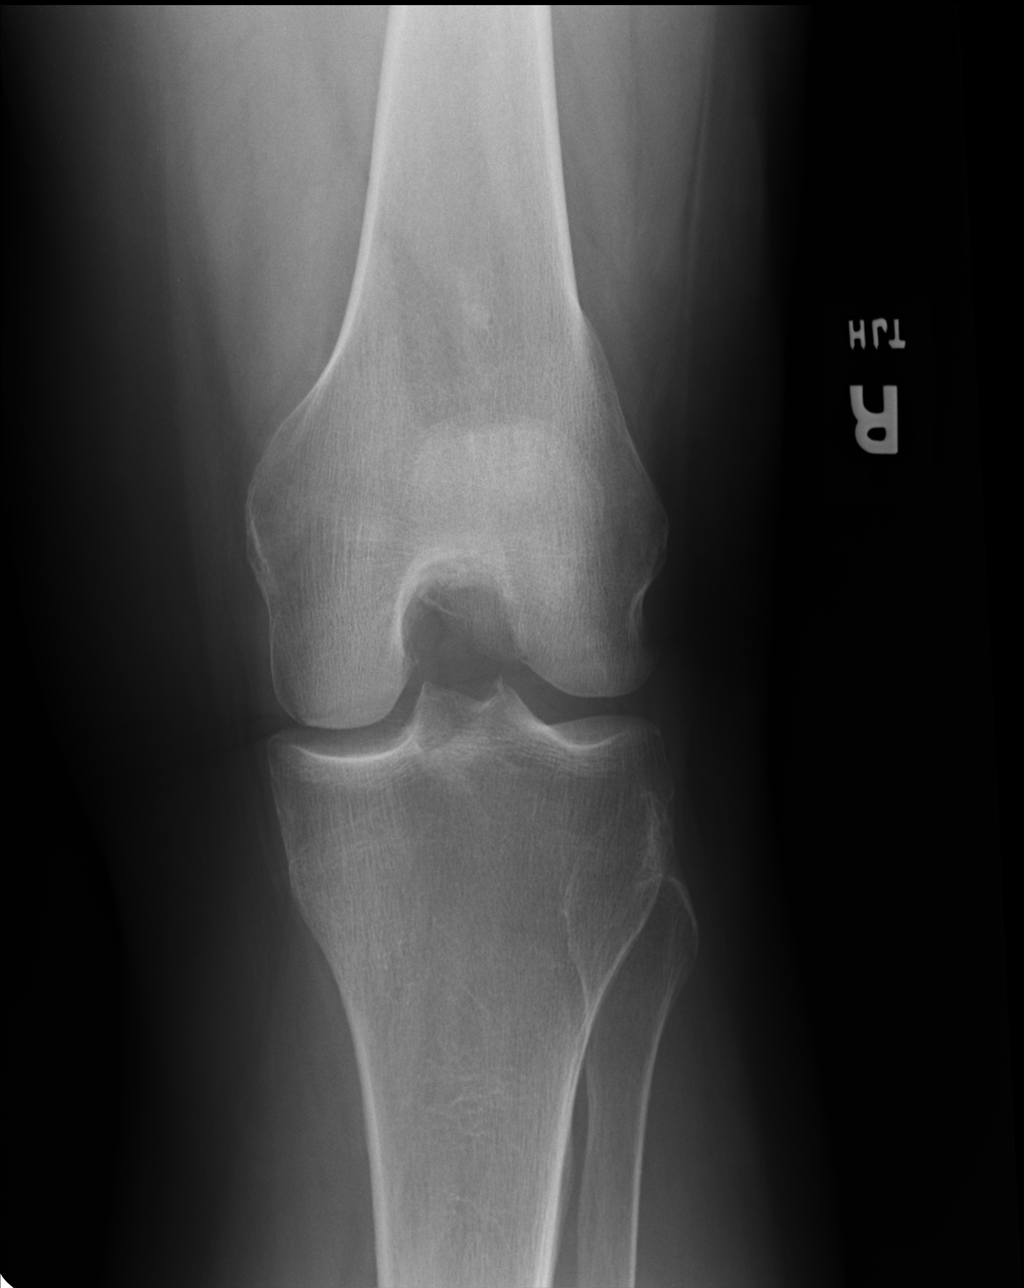

[ap int rot]
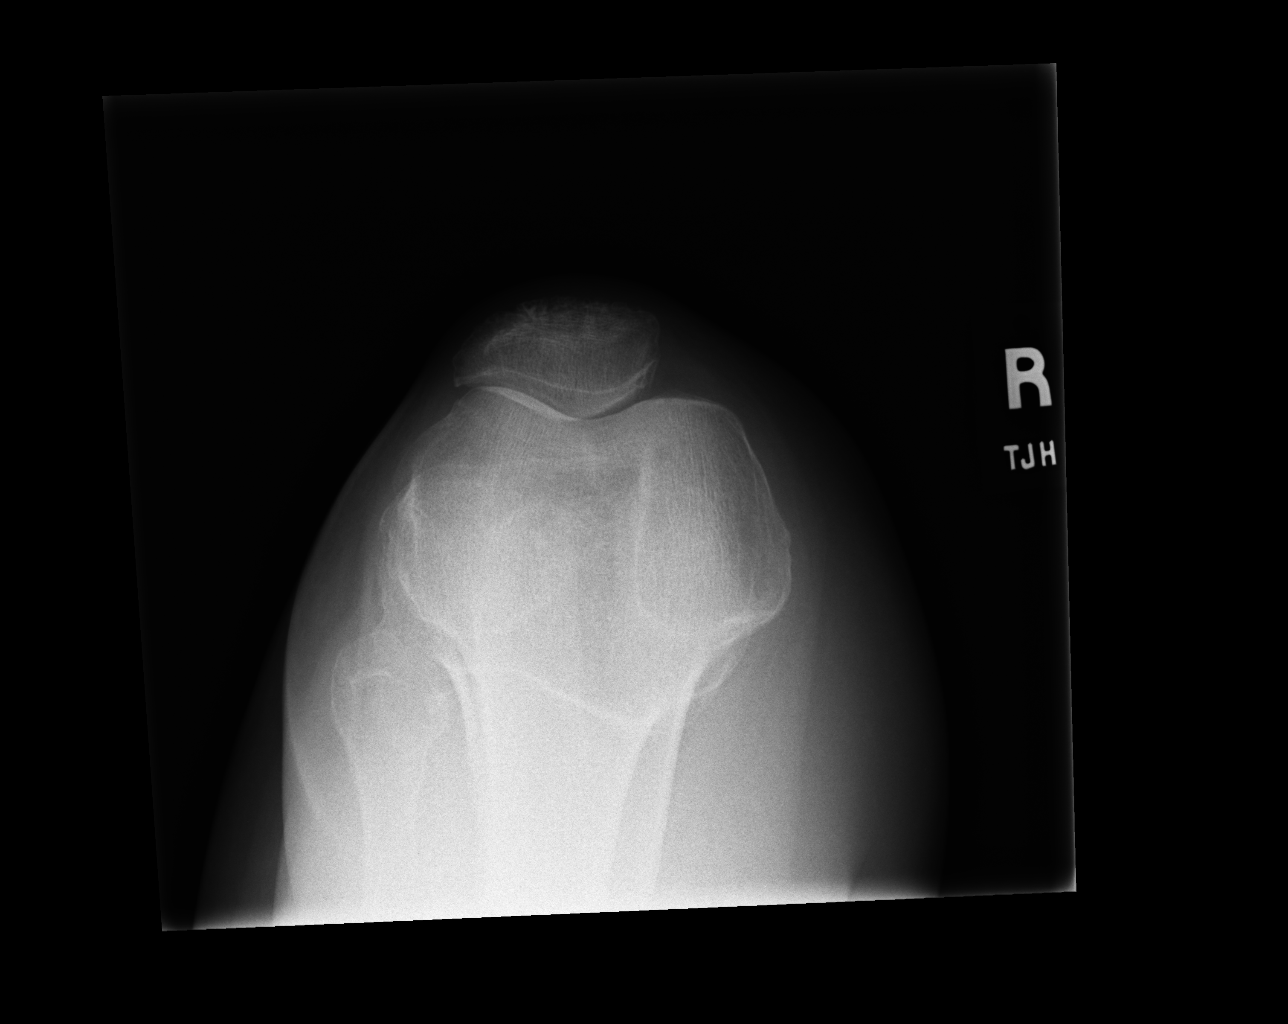

[4 of 4 positions shown; findings below may reference images not displayed]

FINDINGS: Four views of the right knee submitted.  There is
narrowing of medial joint compartment.  Narrowing of patellofemoral
joint space.  No acute fracture or subluxation.  No joint effusion.
Mild spurring of the patella.
IMPRESSION: No acute fracture or subluxation. Degenerative changes as described
above.

## 2013-08-25 DIAGNOSIS — IMO0002 Reserved for concepts with insufficient information to code with codable children: Secondary | ICD-10-CM | POA: Diagnosis not present

## 2013-08-25 DIAGNOSIS — Q762 Congenital spondylolisthesis: Secondary | ICD-10-CM | POA: Diagnosis not present

## 2013-09-23 ENCOUNTER — Telehealth: Payer: Self-pay | Admitting: Pulmonary Disease

## 2013-09-23 MED ORDER — AZITHROMYCIN 250 MG PO TABS: ORAL_TABLET | ORAL | Status: DC

## 2013-09-23 NOTE — Telephone Encounter (Signed)
Zpak sent to [facility].  Pt is aware and is to call back if symptoms do not improve or worsen.

## 2013-09-23 NOTE — Telephone Encounter (Signed)
Patient returned call.  Spoke with patient, sore throat, runny nose w/ clear mucus onset during the night.  Will be going out of town on Feb 2 x1 week.  Pt is requesting a zpak.  Denies cough, dyspnea, wheezing, chest tightness, sinus pressure, PND, HA, sneezing, bloody mucus, f/c/s.  [place] NKDA - verified Last ov w/ SN 2.4.14, follow up in 6 months.

## 2013-09-23 NOTE — Telephone Encounter (Signed)
lmomtcb x1 for pt 

## 2013-09-23 NOTE — Telephone Encounter (Signed)
Per SN----  zpak #1  Take as directed

## 2013-10-29 DIAGNOSIS — I781 Nevus, non-neoplastic: Secondary | ICD-10-CM | POA: Diagnosis not present

## 2013-10-29 DIAGNOSIS — D239 Other benign neoplasm of skin, unspecified: Secondary | ICD-10-CM | POA: Diagnosis not present

## 2013-10-29 DIAGNOSIS — L821 Other seborrheic keratosis: Secondary | ICD-10-CM | POA: Diagnosis not present

## 2013-10-29 DIAGNOSIS — L738 Other specified follicular disorders: Secondary | ICD-10-CM | POA: Diagnosis not present

## 2013-10-29 DIAGNOSIS — I8390 Asymptomatic varicose veins of unspecified lower extremity: Secondary | ICD-10-CM | POA: Diagnosis not present

## 2013-10-29 DIAGNOSIS — L299 Pruritus, unspecified: Secondary | ICD-10-CM | POA: Diagnosis not present

## 2013-11-02 DIAGNOSIS — H40059 Ocular hypertension, unspecified eye: Secondary | ICD-10-CM | POA: Diagnosis not present

## 2013-11-02 DIAGNOSIS — Z961 Presence of intraocular lens: Secondary | ICD-10-CM | POA: Diagnosis not present

## 2013-11-02 DIAGNOSIS — H16109 Unspecified superficial keratitis, unspecified eye: Secondary | ICD-10-CM | POA: Diagnosis not present

## 2013-11-02 DIAGNOSIS — H04129 Dry eye syndrome of unspecified lacrimal gland: Secondary | ICD-10-CM | POA: Diagnosis not present

## 2013-11-03 ENCOUNTER — Other Ambulatory Visit: Payer: Self-pay | Admitting: Pulmonary Disease

## 2013-11-03 MED ORDER — CYCLOBENZAPRINE HCL 10 MG PO TABS: ORAL_TABLET | ORAL | Status: DC

## 2013-11-05 ENCOUNTER — Ambulatory Visit: Payer: Medicare Other | Admitting: Pulmonary Disease

## 2013-11-06 ENCOUNTER — Telehealth: Payer: Self-pay | Admitting: Pulmonary Disease

## 2013-11-06 MED ORDER — ESOMEPRAZOLE MAGNESIUM 40 MG PO CPDR: 40.0000 mg | DELAYED_RELEASE_CAPSULE | Freq: Every day | ORAL | Status: DC

## 2013-11-06 MED ORDER — TELMISARTAN-HCTZ 80-12.5 MG PO TABS: 1.0000 | ORAL_TABLET | Freq: Every day | ORAL | Status: DC

## 2013-11-06 MED ORDER — PRAVASTATIN SODIUM 40 MG PO TABS: 40.0000 mg | ORAL_TABLET | Freq: Every day | ORAL | Status: DC

## 2013-11-06 NOTE — Telephone Encounter (Signed)
Spoke w/ pt. Confirmed she needed refills on nexium, micardis, pravastin. I have sent RX into the pharmacy.  Nothing further needed

## 2013-11-30 ENCOUNTER — Ambulatory Visit (INDEPENDENT_AMBULATORY_CARE_PROVIDER_SITE_OTHER): Payer: Medicare Other | Admitting: Internal Medicine

## 2013-11-30 ENCOUNTER — Encounter: Payer: Self-pay | Admitting: Internal Medicine

## 2013-11-30 ENCOUNTER — Other Ambulatory Visit (INDEPENDENT_AMBULATORY_CARE_PROVIDER_SITE_OTHER): Payer: Medicare Other

## 2013-11-30 ENCOUNTER — Ambulatory Visit (INDEPENDENT_AMBULATORY_CARE_PROVIDER_SITE_OTHER)
Admission: RE | Admit: 2013-11-30 | Discharge: 2013-11-30 | Disposition: A | Discharge status: Home / Self Care | Payer: Medicare Other | Source: Ambulatory Visit | Attending: Internal Medicine | Admitting: Internal Medicine

## 2013-11-30 VITALS — BP 128/86 | HR 69 | Temp 97.7°F | Resp 16 | Ht 63.0 in | Wt 162.5 lb

## 2013-11-30 DIAGNOSIS — E7800 Pure hypercholesterolemia, unspecified: Secondary | ICD-10-CM

## 2013-11-30 DIAGNOSIS — I10 Essential (primary) hypertension: Secondary | ICD-10-CM

## 2013-11-30 DIAGNOSIS — J984 Other disorders of lung: Secondary | ICD-10-CM

## 2013-11-30 DIAGNOSIS — C8589 Other specified types of non-Hodgkin lymphoma, extranodal and solid organ sites: Secondary | ICD-10-CM

## 2013-11-30 DIAGNOSIS — R05 Cough: Secondary | ICD-10-CM | POA: Insufficient documentation

## 2013-11-30 DIAGNOSIS — R059 Cough, unspecified: Secondary | ICD-10-CM | POA: Diagnosis not present

## 2013-11-30 DIAGNOSIS — M899 Disorder of bone, unspecified: Secondary | ICD-10-CM | POA: Diagnosis not present

## 2013-11-30 DIAGNOSIS — M949 Disorder of cartilage, unspecified: Secondary | ICD-10-CM

## 2013-11-30 DIAGNOSIS — H409 Unspecified glaucoma: Secondary | ICD-10-CM

## 2013-11-30 DIAGNOSIS — K219 Gastro-esophageal reflux disease without esophagitis: Secondary | ICD-10-CM

## 2013-11-30 DIAGNOSIS — I059 Rheumatic mitral valve disease, unspecified: Secondary | ICD-10-CM

## 2013-11-30 DIAGNOSIS — Z0000 Encounter for general adult medical examination without abnormal findings: Secondary | ICD-10-CM | POA: Insufficient documentation

## 2013-11-30 DIAGNOSIS — J309 Allergic rhinitis, unspecified: Secondary | ICD-10-CM

## 2013-11-30 LAB — URINALYSIS, ROUTINE W REFLEX MICROSCOPIC
Bilirubin Urine: NEGATIVE
Hgb urine dipstick: NEGATIVE
Ketones, ur: NEGATIVE
Leukocytes, UA: NEGATIVE
Nitrite: NEGATIVE
RBC / HPF: NONE SEEN (ref 0–?)
Specific Gravity, Urine: 1.02 (ref 1.000–1.030)
Total Protein, Urine: NEGATIVE
Urine Glucose: NEGATIVE
Urobilinogen, UA: 0.2 (ref 0.0–1.0)
WBC, UA: NONE SEEN (ref 0–?)
pH: 5.5 (ref 5.0–8.0)

## 2013-11-30 LAB — CBC WITH DIFFERENTIAL/PLATELET
Basophils Absolute: 0 10*3/uL (ref 0.0–0.1)
Basophils Relative: 0.4 % (ref 0.0–3.0)
Eosinophils Absolute: 0.1 10*3/uL (ref 0.0–0.7)
Eosinophils Relative: 1.2 % (ref 0.0–5.0)
HCT: 38.9 % (ref 36.0–46.0)
Hemoglobin: 13.1 g/dL (ref 12.0–15.0)
Lymphocytes Relative: 19.2 % (ref 12.0–46.0)
Lymphs Abs: 1.2 10*3/uL (ref 0.7–4.0)
MCHC: 33.7 g/dL (ref 30.0–36.0)
MCV: 92.1 fl (ref 78.0–100.0)
Monocytes Absolute: 0.4 10*3/uL (ref 0.1–1.0)
Monocytes Relative: 6.9 % (ref 3.0–12.0)
Neutro Abs: 4.4 10*3/uL (ref 1.4–7.7)
Neutrophils Relative %: 72.3 % (ref 43.0–77.0)
Platelets: 218 10*3/uL (ref 150.0–400.0)
RBC: 4.22 Mil/uL (ref 3.87–5.11)
RDW: 12.3 % (ref 11.5–14.6)
WBC: 6.1 10*3/uL (ref 4.5–10.5)

## 2013-11-30 LAB — COMPREHENSIVE METABOLIC PANEL
ALT: 20 U/L (ref 0–35)
AST: 22 U/L (ref 0–37)
Albumin: 4.1 g/dL (ref 3.5–5.2)
Alkaline Phosphatase: 71 U/L (ref 39–117)
BUN: 17 mg/dL (ref 6–23)
CO2: 32 mEq/L (ref 19–32)
Calcium: 9.4 mg/dL (ref 8.4–10.5)
Chloride: 99 mEq/L (ref 96–112)
Creatinine, Ser: 0.6 mg/dL (ref 0.4–1.2)
GFR: 97.76 mL/min (ref >60.00)
Glucose, Bld: 95 mg/dL (ref 70–99)
Potassium: 3.7 mEq/L (ref 3.5–5.1)
Sodium: 140 mEq/L (ref 135–145)
Total Bilirubin: 0.6 mg/dL (ref 0.3–1.2)
Total Protein: 7.2 g/dL (ref 6.0–8.3)

## 2013-11-30 LAB — FECAL OCCULT BLOOD, GUAIAC: Fecal Occult Blood: NEGATIVE

## 2013-11-30 LAB — LIPID PANEL
Cholesterol: 169 mg/dL (ref 0–200)
HDL: 45.9 mg/dL (ref >39.00)
LDL Cholesterol: 111 mg/dL — ABNORMAL HIGH (ref 0–99)
Total CHOL/HDL Ratio: 4
Triglycerides: 60 mg/dL (ref 0.0–149.0)
VLDL: 12 mg/dL (ref 0.0–40.0)

## 2013-11-30 LAB — TSH: TSH: 1.08 u[IU]/mL (ref 0.35–5.50)

## 2013-11-30 NOTE — Progress Notes (Signed)
Subjective:    Patient ID: Colleen White, female    DOB: 1945/10/28, 68 y.o.   MRN: [id number]  Cough This is a recurrent problem. Episode onset: for 2 months. The problem has been gradually improving. The problem occurs every few hours. The cough is non-productive. Pertinent negatives include no chest pain, chills, ear congestion, ear pain, fever, headaches, heartburn, hemoptysis, myalgias, nasal congestion, postnasal drip, rash, rhinorrhea, sore throat, shortness of breath, sweats, weight loss or wheezing. Nothing aggravates the symptoms. There is no history of asthma, bronchiectasis, bronchitis, COPD, emphysema, environmental allergies or pneumonia.      Review of Systems  Constitutional: Negative.  Negative for fever, chills, weight loss, diaphoresis, appetite change and fatigue.  HENT: Negative.  Negative for congestion, ear pain, facial swelling, postnasal drip, rhinorrhea, sinus pressure, sore throat, trouble swallowing and voice change.   Eyes: Negative.   Respiratory: Positive for cough. Negative for apnea, hemoptysis, choking, chest tightness, shortness of breath, wheezing and stridor.   Cardiovascular: Negative.  Negative for chest pain, palpitations and leg swelling.  Gastrointestinal: Positive for blood in stool and anal bleeding. Negative for heartburn, nausea, vomiting, abdominal pain, diarrhea, constipation, abdominal distention and rectal pain.       She has noticed blood on the toilet paper a few times  Endocrine: Negative.   Genitourinary: Negative.   Musculoskeletal: Negative.  Negative for arthralgias, myalgias and neck stiffness.  Skin: Negative.  Negative for rash.  Allergic/Immunologic: Negative.  Negative for environmental allergies.  Neurological: Negative.  Negative for dizziness, tremors, facial asymmetry, weakness, light-headedness and headaches.  Hematological: Negative.  Negative for adenopathy. Does not bruise/bleed easily.  Psychiatric/Behavioral:  Negative.        Objective:   Physical Exam  Vitals reviewed. Constitutional: She is oriented to person, place, and time. She appears well-developed and well-nourished.  Non-toxic appearance. She does not have a sickly appearance. She does not appear ill. No distress.  HENT:  Head: Normocephalic and atraumatic.  Mouth/Throat: Oropharynx is clear and moist. No oropharyngeal exudate.  Eyes: Conjunctivae are normal. Right eye exhibits no discharge. Left eye exhibits no discharge. No scleral icterus.  Neck: Normal range of motion. Neck supple. No JVD present. No tracheal deviation present. No thyromegaly present.  Cardiovascular: Normal rate, regular rhythm, normal heart sounds and intact distal pulses.  Exam reveals no gallop and no friction rub.   No murmur heard. Pulmonary/Chest: Effort normal and breath sounds normal. No accessory muscle usage or stridor. Not tachypneic. No respiratory distress. She has no decreased breath sounds. She has no wheezes. She has no rales. She exhibits no tenderness.  Abdominal: Soft. Bowel sounds are normal. She exhibits no distension and no mass. There is no tenderness. There is no rebound and no guarding.  Genitourinary: Rectal exam shows internal hemorrhoid. Rectal exam shows no external hemorrhoid, no fissure, no mass, no tenderness and anal tone normal. Guaiac negative stool.  Musculoskeletal: Normal range of motion. She exhibits no edema and no tenderness.  Lymphadenopathy:    She has no cervical adenopathy.  Neurological: She is oriented to person, place, and time.  Skin: Skin is warm and dry. No rash noted. She is not diaphoretic. No erythema. No pallor.  Psychiatric: She has a normal mood and affect. Her behavior is normal. Judgment and thought content normal.     Lab Results  Component Value Date   WBC 4.3* 09/30/2012   HGB 13.3 09/30/2012   HCT 38.7 09/30/2012   PLT 230.0 09/30/2012  GLUCOSE 98 09/30/2012   CHOL 146 04/06/2013   TRIG 111.0 04/06/2013     HDL 35.30* 04/06/2013   LDLDIRECT 142.5 09/30/2012   LDLCALC 89 04/06/2013   ALT 20 09/30/2012   AST 24 09/30/2012   NA 138 09/30/2012   K 4.0 09/30/2012   CL 102 09/30/2012   CREATININE 0.7 09/30/2012   BUN 12 09/30/2012   CO2 31 09/30/2012   TSH 0.97 09/30/2012   HGBA1C 5.7 08/09/2009       Assessment & Plan:

## 2013-11-30 NOTE — Assessment & Plan Note (Signed)
I think she has a post-viral URI cough I will check her CXR to see if there is emphysema, mass, pna, edema IF the cough persists then I will ask her to stop taking amlodipine

## 2013-11-30 NOTE — Assessment & Plan Note (Signed)
I will recheck her CXR today

## 2013-11-30 NOTE — Assessment & Plan Note (Addendum)

## 2013-11-30 NOTE — Assessment & Plan Note (Signed)
Her BP is well controlled Today I will check her lytes and renal fucnction

## 2013-11-30 NOTE — Progress Notes (Signed)
Pre visit review using our clinic review tool, if applicable. No additional management support is needed unless otherwise documented below in the visit note. 

## 2013-11-30 NOTE — Patient Instructions (Signed)

## 2013-11-30 NOTE — Assessment & Plan Note (Signed)
FLP today 

## 2013-12-10 DIAGNOSIS — R928 Other abnormal and inconclusive findings on diagnostic imaging of breast: Secondary | ICD-10-CM | POA: Diagnosis not present

## 2013-12-11 DIAGNOSIS — R928 Other abnormal and inconclusive findings on diagnostic imaging of breast: Secondary | ICD-10-CM | POA: Diagnosis not present

## 2013-12-25 ENCOUNTER — Ambulatory Visit: Payer: Medicare Other

## 2013-12-25 ENCOUNTER — Ambulatory Visit (INDEPENDENT_AMBULATORY_CARE_PROVIDER_SITE_OTHER): Payer: Medicare Other | Admitting: Emergency Medicine

## 2013-12-25 VITALS — BP 128/80 | HR 84 | Temp 99.0°F | Resp 17 | Ht 63.0 in | Wt 162.0 lb

## 2013-12-25 DIAGNOSIS — R05 Cough: Secondary | ICD-10-CM

## 2013-12-25 DIAGNOSIS — J209 Acute bronchitis, unspecified: Secondary | ICD-10-CM

## 2013-12-25 DIAGNOSIS — R059 Cough, unspecified: Secondary | ICD-10-CM | POA: Diagnosis not present

## 2013-12-25 MED ORDER — HYDROCODONE-ACETAMINOPHEN 7.5-325 MG/15ML PO SOLN: ORAL | Status: DC

## 2013-12-25 MED ORDER — HYDROCODONE-HOMATROPINE 5-1.5 MG/5ML PO SYRP: 5.0000 mL | ORAL_SOLUTION | Freq: Three times a day (TID) | ORAL | Status: DC | PRN

## 2013-12-25 NOTE — Patient Instructions (Addendum)
Use the hydrocodone syrup as needed for cough- remember it will make you sleepy so do not use it when you need to drive or when you take the remeron or flexeril.  Let us know if you do not feel better in the next few days- Sooner if worse.   Meds ordered this encounter  Medications  . pseudoephedrine-guaifenesin (MUCINEX D) 60-600 MG per tablet    Sig: Take 1 tablet by mouth every 12 (twelve) hours.  [name] DISCONTD: HYDROcodone-homatropine (HYCODAN) 5-1.5 MG/5ML syrup    Sig: Take 5 mLs by mouth every 8 (eight) hours as needed for cough.    Dispense:  120 mL    Refill:  0  . HYDROcodone-acetaminophen (HYCET) 7.5-325 mg/15 ml solution    Sig: Take 5 or 10 ml by mouth every 8 hours as needed for cough and pain    Dispense:  120 mL    Refill:  0

## 2013-12-25 NOTE — Progress Notes (Signed)
[facility] [address] [place] [address] [phone]  Date:  12/25/2013   Name:  Colleen White   DOB:  1945/12/10   MRN:  [id number]  PCP:  Scarlette Calico, MD    Chief Complaint: Cough, Nasal Congestion, Sore Throat and Laryngitis   History of Present Illness:  Colleen White is a 68 y.o. very pleasant female patient who presents with the following:  Ill past week with nasal congestion and post nasal drainage.  Largely non purulent.  Has a sore throat.  Cough with no wheezing or shortness of breath.  Cough is mostly non productive.  Took a course of zithromax and improved but now has worsened.  No improvement with over the counter medications or other home remedies. Denies other complaint or health concern today.   Patient Active Problem List   Diagnosis Date Noted  . Routine general medical examination at a health care facility 11/30/2013  . Cough 11/30/2013  . LYMPHOMA 10/20/2010  . GLAUCOMA 08/04/2008  . ALLERGIC RHINITIS 12/17/2007  . PULMONARY NODULE 12/17/2007  . OSTEOPENIA 12/17/2007  . HYPERCHOLESTEROLEMIA 07/31/2007  . HYPERTENSION 07/31/2007  . MITRAL VALVE PROLAPSE 07/31/2007  . GERD 07/31/2007    Past Medical History  Diagnosis Date  . Other malignant lymphomas, unspecified site, extranodal and solid organ sites   . Unspecified glaucoma   . Allergic rhinitis, cause unspecified   . Pulmonary nodule   . Unspecified essential hypertension   . Mitral valve disorders   . Pure hypercholesterolemia   . Esophageal reflux   . Diverticulosis of colon (without mention of hemorrhage)   . Colon polyps   . Hemorrhoids   . Fibrocystic breast disease   . Osteopenia   . Anxiety   . Alopecia   . Headache(784.0)   . Acid reflux   . High cholesterol     Past Surgical History  Procedure Laterality Date  . S/p tah/bso    . S/p orif prox phalanx  02/2007    right little finger----Dr Fredna Dow  . 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 Cloyd Stagers, [facility]  . Abdominal hysterectomy  1996  . Eye surgery  2009    Cataract    History  Substance Use Topics  . Smoking status: Former Smoker    Quit date: 08/27/1974  . Smokeless tobacco: Never Used  . Alcohol Use: No     Comment: occasionally    Family History  Problem Relation Age of Onset  . Hypertension Mother   . Cancer Father     throat  . Cancer Sister     breast (sister)    No Known Allergies  Medication list has been reviewed and updated.  Current Outpatient Prescriptions on File Prior to Visit  Medication Sig Dispense Refill  . amLODipine (NORVASC) 10 MG tablet Take 0.5 tablets (5 mg total) by mouth daily.  30 tablet  11  . aspirin 81 MG tablet Take 81 mg by mouth daily.        . brimonidine-timolol (COMBIGAN) 0.2-0.5 % ophthalmic solution Place 1 drop into both eyes every 12 (twelve) hours.        . cholecalciferol (VITAMIN D) 1000 UNITS tablet Take 1,000 Units by mouth daily.        . cyclobenzaprine (FLEXERIL) 10 MG tablet TAKE 1 TABLET THREE TIMES DAILY AS NEEDED FOR MUSCLE SPASMS.  50 tablet  3  . docusate sodium (COLACE) 100 MG capsule Take once daily       .  esomeprazole (NEXIUM) 40 MG capsule Take 1 capsule (40 mg total) by mouth daily. 30 minutes before the evening meal  30 capsule  6  . loratadine (CLARITIN) 10 MG tablet Take 10 mg by mouth daily. As needed       . Minoxidil 5 % FOAM Apply 3-5 mLs topically at bedtime. Apply to Scalp for hair loss at bedtime       . mirtazapine (REMERON) 15 MG tablet Take 7.5 mg by mouth at bedtime.       . Multiple Vitamins-Minerals (CENTRUM) tablet Take 1 tablet by mouth daily.        . pravastatin (PRAVACHOL) 40 MG tablet Take 1 tablet (40 mg total) by mouth daily.  30 tablet  6  . telmisartan-hydrochlorothiazide (MICARDIS HCT) 80-12.5 MG per tablet Take 1 tablet by mouth daily.  30 tablet  6  . tretinoin (RETIN-A) 0.05 % cream Once daily        No current facility-administered medications  on file prior to visit.    Review of Systems:  As per HPI, otherwise negative.    Physical Examination: Filed Vitals:   12/25/13 1804  BP: 128/80  Pulse: 84  Temp: 99 F (37.2 C)  Resp: 17   Filed Vitals:   12/25/13 1804  Height: 5\' 3"  (1.6 m)  Weight: 162 lb (73.483 kg)   Body mass index is 28.7 kg/(m^2). Ideal Body Weight: Weight in (lb) to have BMI = 25: 140.8  GEN: WDWN, NAD, Non-toxic, A & O x 3 HEENT: Atraumatic, Normocephalic. Neck supple. No masses, No LAD. Ears and Nose: No external deformity. CV: RRR, No M/G/R. No JVD. No thrill. No extra heart sounds. PULM: CTA B, no wheezes, crackles, rhonchi. No retractions. No resp. distress. No accessory muscle use. ABD: S, NT, ND, +BS. No rebound. No HSM. EXTR: No c/c/e NEURO Normal gait.  PSYCH: Normally interactive. Conversant. Not depressed or anxious appearing.  Calm demeanor.    Assessment and Plan: Bronchitis She has been treated with a course of azithromycin   Signed,  Ellison Carwin, MD  [facility] reading (PRIMARY) by  Dr. Ouida Sills.  Negative chest.

## 2013-12-26 NOTE — Progress Notes (Signed)
Reviewed her CXR  [facility] reading (PRIMARY) by  Dr. Lorelei Pont. CXR: negative  CHEST 2 VIEW  COMPARISON: 11/30/2013 and prior chest radiographs  FINDINGS:  The cardiomediastinal silhouette is unremarkable.  The lungs are clear.  There is no evidence of focal airspace disease, pulmonary edema,  suspicious pulmonary nodule/mass, pleural effusion, or pneumothorax.  No acute bony abnormalities are identified.  IMPRESSION:  No active cardiopulmonary disease.   Acute bronchitis - Plan: DG Chest 2 View  Cough - Plan: HYDROcodone-acetaminophen (HYCET) 7.5-325 mg/15 ml solution, DISCONTINUED: HYDROcodone-homatropine (HYCODAN) 5-1.5 MG/5ML syrup  She has one day left of her azithromycin zpack.  At this point no evidence of pneumonia.  Will give her some hycet to use as needed for cough, and she will let me know if she does not feel better in the next few days- Sooner if worse.

## 2013-12-30 ENCOUNTER — Ambulatory Visit (INDEPENDENT_AMBULATORY_CARE_PROVIDER_SITE_OTHER): Payer: Medicare Other | Admitting: Family Medicine

## 2013-12-30 VITALS — BP 118/60 | HR 114 | Temp 98.0°F | Resp 18 | Ht 63.0 in | Wt 162.0 lb

## 2013-12-30 DIAGNOSIS — R062 Wheezing: Secondary | ICD-10-CM | POA: Diagnosis not present

## 2013-12-30 DIAGNOSIS — R059 Cough, unspecified: Secondary | ICD-10-CM

## 2013-12-30 DIAGNOSIS — J209 Acute bronchitis, unspecified: Secondary | ICD-10-CM

## 2013-12-30 DIAGNOSIS — R05 Cough: Secondary | ICD-10-CM | POA: Diagnosis not present

## 2013-12-30 LAB — POCT CBC
Granulocyte percent: 67.5 %G (ref 37–80)
HCT, POC: 33.6 % — AB (ref 37.7–47.9)
Hemoglobin: 10.8 g/dL — AB (ref 12.2–16.2)
Lymph, poc: 1.3 (ref 0.6–3.4)
MCH, POC: 30.6 pg (ref 27–31.2)
MCHC: 32.1 g/dL (ref 31.8–35.4)
MCV: 95.1 fL (ref 80–97)
MID (cbc): 0.3 (ref 0–0.9)
MPV: 9 fL (ref 0–99.8)
POC Granulocyte: 3.4 (ref 2–6.9)
POC LYMPH PERCENT: 25.7 %L (ref 10–50)
POC MID %: 6.8 %M (ref 0–12)
Platelet Count, POC: 209 10*3/uL (ref 142–424)
RBC: 3.53 M/uL — AB (ref 4.04–5.48)
RDW, POC: 13 %
WBC: 5.1 10*3/uL (ref 4.6–10.2)

## 2013-12-30 MED ORDER — ALBUTEROL SULFATE (2.5 MG/3ML) 0.083% IN NEBU
2.5000 mg | INHALATION_SOLUTION | Freq: Once | RESPIRATORY_TRACT | Status: AC
  Administered 2013-12-30: 2.5 mg via RESPIRATORY_TRACT

## 2013-12-30 MED ORDER — ALBUTEROL SULFATE HFA 108 (90 BASE) MCG/ACT IN AERS: 2.0000 | INHALATION_SPRAY | RESPIRATORY_TRACT | Status: DC | PRN

## 2013-12-30 MED ORDER — HYDROCOD POLST-CHLORPHEN POLST 10-8 MG/5ML PO LQCR: 5.0000 mL | Freq: Two times a day (BID) | ORAL | Status: DC | PRN

## 2013-12-30 NOTE — Progress Notes (Addendum)
Subjective:    Patient ID: Colleen White, female    DOB: Aug 22, 1946, 68 y.o.   MRN: [id number] This chart was scribed for Delman Cheadle, MD by Vernell Barrier, Medical Scribe. This patient's care was started at 5:36 PM.  Chief Complaint  Patient presents with   Follow-up    diagnosed with bronchitis on Friday, not feeling better   Cough    productive- "yellow", turned yellow today   Wheezing    noticed wheezing this afternoon   Cough Associated symptoms include rhinorrhea and wheezing. Pertinent negatives include no chills, ear pain, fever, postnasal drip, sore throat or shortness of breath.  Wheezing  Associated symptoms include coughing and rhinorrhea. Pertinent negatives include no chills, ear pain, fever, shortness of breath or sore throat.   HPI Comments: Colleen White is a 68 y.o. female who presents to the [facility] bronchitis follow up. Seen by Dr. Ouida Sills 5 days previously. Complaint of one week of congestion, post nasal drip, and sore throat along with non productive cough. Failed treatment with Z-Pack. Has a chest x-ray which was normal.   States she sees Dr. [name] on a regular basis as her medical doctor but he has not been seeing her for pulmonary issues. States he is only seeing pulmonary pts now so she has had to locate a new medical doctor. Today presents with intermittent productive cough . Believes she heard some wheezing as the day has been progressing. States symptoms were still present 1 day prior but she was feeling better until this morning. Reports rhinorrhea over the past couple of days. Sore throat has resolved. Cough syrup has been providing Pt is a lymphoma pt in remission. Denies fever, chills, SOB, or ear pain.   Patient Active Problem List   Diagnosis Date Noted   Routine general medical examination at a health care facility 11/30/2013   Cough 11/30/2013   LYMPHOMA 10/20/2010   GLAUCOMA 08/04/2008   ALLERGIC RHINITIS  12/17/2007   PULMONARY NODULE 12/17/2007   OSTEOPENIA 12/17/2007   HYPERCHOLESTEROLEMIA 07/31/2007   HYPERTENSION 07/31/2007   MITRAL VALVE PROLAPSE 07/31/2007   GERD 07/31/2007   Past Medical History  Diagnosis Date   Other malignant lymphomas, unspecified site, extranodal and solid organ sites    Unspecified glaucoma    Allergic rhinitis, cause unspecified    Pulmonary nodule    Unspecified essential hypertension    Mitral valve disorders    Pure hypercholesterolemia    Esophageal reflux    Diverticulosis of colon (without mention of hemorrhage)    Colon polyps    Hemorrhoids    Fibrocystic breast disease    Osteopenia    Anxiety    Alopecia    Headache(784.0)    Acid reflux    High cholesterol    Past Surgical History  Procedure Laterality Date   S/p tah/bso     S/p orif prox phalanx  02/2007    right little finger----Dr Fredna Dow   S/p elap----follicular lymphoma  22/2979    4cm mesenteric mass, Dr. Cloyd Stagers, [facility]   Abdominal hysterectomy  [place] surgery  2009    Cataract   No Known Allergies Prior to Admission medications   Medication Sig Start Date End Date Taking? Authorizing Provider  amLODipine (NORVASC) 10 MG tablet Take 0.5 tablets (5 mg total) by mouth daily. 09/30/12  Yes Noralee Space, MD  aspirin 81 MG tablet Take 81 mg by mouth daily.     Yes Historical Provider, MD  brimonidine-timolol (COMBIGAN) 0.2-0.5 % ophthalmic solution Place 1 drop into both eyes every 12 (twelve) hours.     Yes Historical Provider, MD  cholecalciferol (VITAMIN D) 1000 UNITS tablet Take 1,000 Units by mouth daily.     Yes Historical Provider, MD  cyclobenzaprine (FLEXERIL) 10 MG tablet TAKE 1 TABLET THREE TIMES DAILY AS NEEDED FOR MUSCLE SPASMS. 11/03/13  Yes Noralee Space, MD  docusate sodium (COLACE) 100 MG capsule Take once daily    Yes Historical Provider, MD  esomeprazole (NEXIUM) 40 MG capsule Take 1 capsule (40 mg total) by mouth daily. 30 minutes  before the evening meal 11/06/13  Yes Noralee Space, MD  HYDROcodone-acetaminophen (HYCET) 7.5-325 mg/15 ml solution Take 5 or 10 ml by mouth every 8 hours as needed for cough and pain 12/25/13  Yes Gay Filler Copland, MD  loratadine (CLARITIN) 10 MG tablet Take 10 mg by mouth daily. As needed    Yes Historical Provider, MD  Minoxidil 5 % FOAM Apply 3-5 mLs topically at bedtime. Apply to Scalp for hair loss at bedtime    Yes Historical Provider, MD  mirtazapine (REMERON) 15 MG tablet Take 7.5 mg by mouth at bedtime.    Yes Historical Provider, MD  Multiple Vitamins-Minerals (CENTRUM) tablet Take 1 tablet by mouth daily.     Yes Historical Provider, MD  pravastatin (PRAVACHOL) 40 MG tablet Take 1 tablet (40 mg total) by mouth daily. 11/06/13  Yes Noralee Space, MD  pseudoephedrine-guaifenesin [facility] D) 60-600 MG per tablet Take 1 tablet by mouth every 12 (twelve) hours.   Yes Historical Provider, MD  telmisartan-hydrochlorothiazide (MICARDIS HCT) 80-12.5 MG per tablet Take 1 tablet by mouth daily. 11/06/13  Yes Noralee Space, MD  tretinoin (RETIN-A) 0.05 % cream Once daily    Yes Historical Provider, MD   History   Social History   Marital Status: Married    Spouse Name: N/A    Number of Children: N/A   Years of Education: N/A   Occupational History   Not on file.   Social History Main Topics   Smoking status: Former Smoker    Quit date: 08/27/1974   Smokeless tobacco: Never Used   Alcohol Use: No     Comment: occasionally   Drug Use: No   Sexual Activity: Yes   Other Topics Concern   Not on file   Social History Narrative   No narrative on file    Review of Systems  Constitutional: Negative for fever and chills.  HENT: Positive for rhinorrhea. Negative for congestion, ear pain, postnasal drip, sinus pressure and sore throat.   Respiratory: Positive for cough and wheezing. Negative for shortness of breath.    BP 118/60   Pulse 114   Temp(Src) 98 F (36.7 C) (Oral)    Resp 18   Ht 5' 3"  (1.6 m)   Wt 162 lb (73.483 kg)   BMI 28.70 kg/m2   SpO2 95%   Objective:  Physical Exam  Nursing note and vitals reviewed. Constitutional: She is oriented to person, place, and time. She appears well-developed and well-nourished. No distress.  HENT:  Head: Normocephalic and atraumatic.  Mouth/Throat: Posterior oropharyngeal erythema present.  Normal mucosa.   Eyes: EOM are normal.  Neck: Neck supple.  No tonsillar adenopathy.  Cardiovascular: Normal rate.   Pulmonary/Chest: Effort normal. No respiratory distress. She has wheezes in the right upper field and the left lower field. She has rhonchi in the right lower field and the left  lower field.  Musculoskeletal: Normal range of motion.  Lymphadenopathy:    She has no cervical adenopathy.  Neurological: She is alert and oriented to person, place, and time.  Skin: Skin is warm and dry.  Psychiatric: She has a normal mood and affect. Her behavior is normal.   Results for orders placed in visit on 12/30/13  POCT CBC      Result Value Ref Range   WBC 5.1  4.6 - 10.2 K/uL   Lymph, poc 1.3  0.6 - 3.4   POC LYMPH PERCENT 25.7  10 - 50 %L   MID (cbc) 0.3  0 - 0.9   POC MID % 6.8  0 - 12 %M   POC Granulocyte 3.4  2 - 6.9   Granulocyte percent 67.5  37 - 80 %G   RBC 3.53 (*) 4.04 - 5.48 M/uL   Hemoglobin 10.8 (*) 12.2 - 16.2 g/dL   HCT, POC 33.6 (*) 37.7 - 47.9 %   MCV 95.1  80 - 97 fL   MCH, POC 30.6  27 - 31.2 pg   MCHC 32.1  31.8 - 35.4 g/dL   RDW, POC 13.0     Platelet Count, POC 209  142 - 424 K/uL   MPV 9.0  0 - 99.8 fL    Assessment & Plan:   Wheezing - Plan: POCT CBC, albuterol (PROVENTIL) (2.5 MG/3ML) 0.083% nebulizer solution 2.5 mg  Cough - Plan: POCT CBC, albuterol (PROVENTIL) (2.5 MG/3ML) 0.083% nebulizer solution 2.5 mg  Acute bronchitis -  Reassured by CBC. Pulm exam cleared after albuterol neb treatment with deep breathing so rec try home albuterol inh.   Plans to continue Claritin and Delsym  qam. Will use Albuterol inhaler as needed during day. Would like to try Tussionex instead of Hycet for qhs cough suppression. If worsening with fever, chills, or purulent productive cough, ok to call office and will call in rx for Omnicef 300 mg bid x 10 days. Pt advised to abstain from decongestants due to underlying HTN and tachycardia. Warned that postviral cough might persist for 3-4 weeks.   Meds ordered this encounter  Medications   albuterol (PROVENTIL) (2.5 MG/3ML) 0.083% nebulizer solution 2.5 mg    Sig:    albuterol (PROVENTIL HFA;VENTOLIN HFA) 108 (90 BASE) MCG/ACT inhaler    Sig: Inhale 2 puffs into the lungs every 4 (four) hours as needed for wheezing or shortness of breath (cough, shortness of breath or wheezing.).    Dispense:  1 Inhaler    Refill:  1   chlorpheniramine-HYDROcodone (TUSSIONEX PENNKINETIC ER) 10-8 MG/5ML LQCR    Sig: Take 5 mLs by mouth every 12 (twelve) hours as needed.    Dispense:  120 mL    Refill:  0    I personally performed the services described in this documentation, which was scribed in my presence. The recorded information has been reviewed and considered, and addended by me as needed.  Delman Cheadle, MD MPH

## 2013-12-30 NOTE — Patient Instructions (Signed)
Acute Bronchitis Bronchitis is inflammation of the airways that extend from the windpipe into the lungs (bronchi). The inflammation often causes mucus to develop. This leads to a cough, which is the most common symptom of bronchitis.  In acute bronchitis, the condition usually develops suddenly and goes away over time, usually in a couple weeks. Smoking, allergies, and asthma can make bronchitis worse. Repeated episodes of bronchitis may cause further lung problems.  CAUSES Acute bronchitis is most often caused by the same virus that causes a cold. The virus can spread from person to person (contagious).  SIGNS AND SYMPTOMS   Cough.   Fever.   Coughing up mucus.   Body aches.   Chest congestion.   Chills.   Shortness of breath.   Sore throat.  DIAGNOSIS  Acute bronchitis is usually diagnosed through a physical exam. Tests, such as chest X-rays, are sometimes done to rule out other conditions.  TREATMENT  Acute bronchitis usually goes away in a couple weeks. Often times, no medical treatment is necessary. Medicines are sometimes given for relief of fever or cough. Antibiotics are usually not needed but may be prescribed in certain situations. In some cases, an inhaler may be recommended to help reduce shortness of breath and control the cough. A cool mist vaporizer may also be used to help thin bronchial secretions and make it easier to clear the chest.  HOME CARE INSTRUCTIONS  Get plenty of rest.   Drink enough fluids to keep your urine clear or pale yellow (unless you have a medical condition that requires fluid restriction). Increasing fluids may help thin your secretions and will prevent dehydration.   Only take over-the-counter or prescription medicines as directed by your health care provider.   Avoid smoking and secondhand smoke. Exposure to cigarette smoke or irritating chemicals will make bronchitis worse. If you are a smoker, consider using nicotine gum or skin  patches to help control withdrawal symptoms. Quitting smoking will help your lungs heal faster.   Reduce the chances of another bout of acute bronchitis by washing your hands frequently, avoiding people with cold symptoms, and trying not to touch your hands to your mouth, nose, or eyes.   Follow up with your health care provider as directed.  SEEK MEDICAL CARE IF: Your symptoms do not improve after 1 week of treatment.  SEEK IMMEDIATE MEDICAL CARE IF:  You develop an increased fever or chills.   You have chest pain.   You have severe shortness of breath.  You have bloody sputum.   You develop dehydration.  You develop fainting.  You develop repeated vomiting.  You develop a severe headache. MAKE SURE YOU:   Understand these instructions.  Will watch your condition.  Will get help right away if you are not doing well or get worse. Document Released: 09/20/2004 Document Revised: 04/15/2013 Document Reviewed: 02/03/2013 [employer] Patient Information 2014 [employer].  

## 2013-12-30 NOTE — Progress Notes (Signed)
Subjective:    Patient ID: Colleen White, female    DOB: 01/30/1946, 68 y.o.   MRN: [id number] This chart was scribed for Delman Cheadle, MD by Vernell Barrier, Medical Scribe. This patient's care was started at 5:36 PM.  Chief Complaint  Patient presents with  . Follow-up    diagnosed with bronchitis on Friday, not feeling better  . Cough    productive- "yellow", turned yellow today  . Wheezing    noticed wheezing this afternoon   Cough Associated symptoms include rhinorrhea and wheezing. Pertinent negatives include no chest pain, chills, ear pain, fever, postnasal drip, sore throat or shortness of breath.  Wheezing  Associated symptoms include coughing and rhinorrhea. Pertinent negatives include no chest pain, chills, ear pain, fever, shortness of breath or sore throat.   HPI Comments: Colleen White is a 68 y.o. female who presents to the [facility] bronchitis follow up. Seen by Dr. Ouida Sills and Copland 5 days previously. At that visit, complaint was of one week of congestion, post nasal drip, and sore throat along with non productive cough. Was on day4 zpack which had been called in by PCP at that time so advised to complete course and try prn hycet for cough relieve. Had a chest x-ray which was normal.   Pt reports she was slowly getting better until today and she feels much worse.  Today her cough has become intermittenlyt productive. Believes she heard some wheezing as the day has been progressing which she has never had prior.  Reports rhinorrhea over the past couple of days. Sore throat has resolved. Cough syrup has been providing Pt is a lymphoma pt in remission. Denies fever, chills, SOB, or ear pain.   Was seeing Dr. Julian Hy as her PCP (does not have any h/o pulmonary problems) but just transitioned to Dr. Ronnald Ramp.  Patient Active Problem List   Diagnosis Date Noted  . Routine general medical examination at a health care facility 11/30/2013  . Cough  11/30/2013  . LYMPHOMA 10/20/2010  . GLAUCOMA 08/04/2008  . ALLERGIC RHINITIS 12/17/2007  . PULMONARY NODULE 12/17/2007  . OSTEOPENIA 12/17/2007  . HYPERCHOLESTEROLEMIA 07/31/2007  . HYPERTENSION 07/31/2007  . MITRAL VALVE PROLAPSE 07/31/2007  . GERD 07/31/2007   Past Medical History  Diagnosis Date  . Other malignant lymphomas, unspecified site, extranodal and solid organ sites   . Unspecified glaucoma   . Allergic rhinitis, cause unspecified   . Pulmonary nodule   . Unspecified essential hypertension   . Mitral valve disorders   . Pure hypercholesterolemia   . Esophageal reflux   . Diverticulosis of colon (without mention of hemorrhage)   . Colon polyps   . Hemorrhoids   . Fibrocystic breast disease   . Osteopenia   . Anxiety   . Alopecia   . Headache(784.0)   . Acid reflux   . High cholesterol    Past Surgical History  Procedure Laterality Date  . S/p tah/bso    . S/p orif prox phalanx  02/2007    right little finger----Dr Fredna Dow  . 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 Cloyd Stagers, [facility]  . Abdominal hysterectomy  1996  . Eye surgery  2009    Cataract   No Known Allergies Prior to Admission medications   Medication Sig Start Date End Date Taking? Authorizing Provider  amLODipine (NORVASC) 10 MG tablet Take 0.5 tablets (5 mg total) by mouth daily. 09/30/12  Yes Noralee Space, MD  aspirin 81 MG tablet Take 81 mg by mouth daily.     Yes Historical Provider, MD  brimonidine-timolol (COMBIGAN) 0.2-0.5 % ophthalmic solution Place 1 drop into both eyes every 12 (twelve) hours.     Yes Historical Provider, MD  cholecalciferol (VITAMIN D) 1000 UNITS tablet Take 1,000 Units by mouth daily.     Yes Historical Provider, MD  cyclobenzaprine (FLEXERIL) 10 MG tablet TAKE 1 TABLET THREE TIMES DAILY AS NEEDED FOR MUSCLE SPASMS. 11/03/13  Yes Noralee Space, MD  docusate sodium (COLACE) 100 MG capsule Take once daily    Yes Historical Provider, MD  esomeprazole  (NEXIUM) 40 MG capsule Take 1 capsule (40 mg total) by mouth daily. 30 minutes before the evening meal 11/06/13  Yes Noralee Space, MD  HYDROcodone-acetaminophen (HYCET) 7.5-325 mg/15 ml solution Take 5 or 10 ml by mouth every 8 hours as needed for cough and pain 12/25/13  Yes Gay Filler Copland, MD  loratadine (CLARITIN) 10 MG tablet Take 10 mg by mouth daily. As needed    Yes Historical Provider, MD  Minoxidil 5 % FOAM Apply 3-5 mLs topically at bedtime. Apply to Scalp for hair loss at bedtime    Yes Historical Provider, MD  mirtazapine (REMERON) 15 MG tablet Take 7.5 mg by mouth at bedtime.    Yes Historical Provider, MD  Multiple Vitamins-Minerals (CENTRUM) tablet Take 1 tablet by mouth daily.     Yes Historical Provider, MD  pravastatin (PRAVACHOL) 40 MG tablet Take 1 tablet (40 mg total) by mouth daily. 11/06/13  Yes Noralee Space, MD  pseudoephedrine-guaifenesin [facility] D) 60-600 MG per tablet Take 1 tablet by mouth every 12 (twelve) hours.   Yes Historical Provider, MD  telmisartan-hydrochlorothiazide (MICARDIS HCT) 80-12.5 MG per tablet Take 1 tablet by mouth daily. 11/06/13  Yes Noralee Space, MD  tretinoin (RETIN-A) 0.05 % cream Once daily    Yes Historical Provider, MD   History   Social History  . Marital Status: Married    Spouse Name: N/A    Number of Children: N/A  . Years of Education: N/A   Occupational History  . Not on file.   Social History Main Topics  . Smoking status: Former Smoker    Quit date: 08/27/1974  . Smokeless tobacco: Never Used  . Alcohol Use: No     Comment: occasionally  . Drug Use: No  . Sexual Activity: Yes   Other Topics Concern  . Not on file   Social History Narrative  . No narrative on file    Review of Systems  Constitutional: Negative for fever and chills.  HENT: Positive for rhinorrhea. Negative for congestion, ear pain, postnasal drip, sinus pressure and sore throat.   Respiratory: Positive for cough and wheezing. Negative for chest  tightness and shortness of breath.   Cardiovascular: Negative for chest pain and palpitations.  Hematological: Negative for adenopathy.  Psychiatric/Behavioral: Positive for sleep disturbance.   BP 118/60  Pulse 114  Temp(Src) 98 F (36.7 C) (Oral)  Resp 18  Ht 5\' 3"  (1.6 m)  Wt 162 lb (73.483 kg)  BMI 28.70 kg/m2  SpO2 95% Objective:  Physical Exam  Nursing note and vitals reviewed. Constitutional: She is oriented to person, place, and time. She appears well-developed and well-nourished. No distress.  HENT:  Head: Normocephalic and atraumatic.  Right Ear: Tympanic membrane, external ear and ear canal normal.  Left Ear: Tympanic membrane, external ear and ear canal normal.  Nose: Nose normal. No  mucosal edema or rhinorrhea.  Mouth/Throat: Uvula is midline and mucous membranes are normal. Posterior oropharyngeal erythema present. No oropharyngeal exudate or posterior oropharyngeal edema.  Eyes: Conjunctivae and EOM are normal. Right eye exhibits no discharge. Left eye exhibits no discharge. No scleral icterus.  Neck: Normal range of motion. Neck supple.  Cardiovascular: Normal rate, regular rhythm, normal heart sounds and intact distal pulses.   Pulmonary/Chest: Effort normal. No respiratory distress. She has wheezes in the right upper field and the left lower field. She has rhonchi in the right lower field and the left lower field.  Musculoskeletal: Normal range of motion.  Lymphadenopathy:       Head (right side): No submandibular, no tonsillar, no preauricular and no posterior auricular adenopathy present.       Head (left side): No submandibular, no tonsillar, no preauricular and no posterior auricular adenopathy present.    She has no cervical adenopathy.       Right: No supraclavicular adenopathy present.       Left: No supraclavicular adenopathy present.  Neurological: She is alert and oriented to person, place, and time.  Skin: Skin is warm and dry. She is not diaphoretic.  No erythema.  Psychiatric: She has a normal mood and affect. Her behavior is normal.   Results for orders placed in visit on 12/30/13  POCT CBC      Result Value Ref Range   WBC 5.1  4.6 - 10.2 K/uL   Lymph, poc 1.3  0.6 - 3.4   POC LYMPH PERCENT 25.7  10 - 50 %L   MID (cbc) 0.3  0 - 0.9   POC MID % 6.8  0 - 12 %M   POC Granulocyte 3.4  2 - 6.9   Granulocyte percent 67.5  37 - 80 %G   RBC 3.53 (*) 4.04 - 5.48 M/uL   Hemoglobin 10.8 (*) 12.2 - 16.2 g/dL   HCT, POC 33.6 (*) 37.7 - 47.9 %   MCV 95.1  80 - 97 fL   MCH, POC 30.6  27 - 31.2 pg   MCHC 32.1  31.8 - 35.4 g/dL   RDW, POC 13.0     Platelet Count, POC 209  142 - 424 K/uL   MPV 9.0  0 - 99.8 fL    Assessment & Plan:   Wheezing - Plan: POCT CBC, albuterol (PROVENTIL) (2.5 MG/3ML) 0.083% nebulizer solution 2.5 mg  Cough - Plan: POCT CBC, albuterol (PROVENTIL) (2.5 MG/3ML) 0.083% nebulizer solution 2.5 mg  Acute bronchitis -  Reassured by CBC. Pulm exam cleared after albuterol neb treatment with deep breathing so rec try home albuterol inh.   Plans to continue Claritin and Delsym qam. Will use Albuterol inhaler as needed during day. Would like to try Tussionex instead of Hycet for qhs cough suppression. If worsening with fever, chills, or purulent productive cough, ok to call office and will call in rx for Omnicef 300 mg bid x 10 days. Pt advised to abstain from decongestants due to underlying HTN and tachycardia. Warned that postviral cough might persist for 3-4 weeks.   Meds ordered this encounter  Medications  . albuterol (PROVENTIL) (2.5 MG/3ML) 0.083% nebulizer solution 2.5 mg    Sig:   . albuterol (PROVENTIL HFA;VENTOLIN HFA) 108 (90 BASE) MCG/ACT inhaler    Sig: Inhale 2 puffs into the lungs every 4 (four) hours as needed for wheezing or shortness of breath (cough, shortness of breath or wheezing.).    Dispense:  1 Inhaler  Refill:  1  . chlorpheniramine-HYDROcodone (TUSSIONEX PENNKINETIC ER) 10-8 MG/5ML LQCR     Sig: Take 5 mLs by mouth every 12 (twelve) hours as needed.    Dispense:  120 mL    Refill:  0    I personally performed the services described in this documentation, which was scribed in my presence. The recorded information has been reviewed and considered, and addended by me as needed.  Delman Cheadle, MD MPH

## 2014-01-05 DIAGNOSIS — R911 Solitary pulmonary nodule: Secondary | ICD-10-CM | POA: Diagnosis not present

## 2014-01-05 DIAGNOSIS — F411 Generalized anxiety disorder: Secondary | ICD-10-CM | POA: Diagnosis not present

## 2014-01-05 DIAGNOSIS — E7800 Pure hypercholesterolemia, unspecified: Secondary | ICD-10-CM | POA: Diagnosis not present

## 2014-01-05 DIAGNOSIS — I10 Essential (primary) hypertension: Secondary | ICD-10-CM | POA: Diagnosis not present

## 2014-01-05 DIAGNOSIS — K219 Gastro-esophageal reflux disease without esophagitis: Secondary | ICD-10-CM | POA: Diagnosis not present

## 2014-01-05 DIAGNOSIS — C8299 Follicular lymphoma, unspecified, extranodal and solid organ sites: Secondary | ICD-10-CM | POA: Diagnosis not present

## 2014-01-05 DIAGNOSIS — D179 Benign lipomatous neoplasm, unspecified: Secondary | ICD-10-CM | POA: Diagnosis not present

## 2014-01-05 DIAGNOSIS — R197 Diarrhea, unspecified: Secondary | ICD-10-CM | POA: Diagnosis not present

## 2014-01-05 DIAGNOSIS — Z923 Personal history of irradiation: Secondary | ICD-10-CM | POA: Diagnosis not present

## 2014-01-05 DIAGNOSIS — Z79899 Other long term (current) drug therapy: Secondary | ICD-10-CM | POA: Diagnosis not present

## 2014-03-02 DIAGNOSIS — Z923 Personal history of irradiation: Secondary | ICD-10-CM | POA: Diagnosis not present

## 2014-03-02 DIAGNOSIS — E7800 Pure hypercholesterolemia, unspecified: Secondary | ICD-10-CM | POA: Diagnosis not present

## 2014-03-02 DIAGNOSIS — I10 Essential (primary) hypertension: Secondary | ICD-10-CM | POA: Diagnosis not present

## 2014-03-02 DIAGNOSIS — R911 Solitary pulmonary nodule: Secondary | ICD-10-CM | POA: Diagnosis not present

## 2014-03-02 DIAGNOSIS — C8299 Follicular lymphoma, unspecified, extranodal and solid organ sites: Secondary | ICD-10-CM | POA: Diagnosis not present

## 2014-03-02 DIAGNOSIS — M47817 Spondylosis without myelopathy or radiculopathy, lumbosacral region: Secondary | ICD-10-CM | POA: Diagnosis not present

## 2014-03-02 DIAGNOSIS — Z9889 Other specified postprocedural states: Secondary | ICD-10-CM | POA: Diagnosis not present

## 2014-03-02 DIAGNOSIS — D72810 Lymphocytopenia: Secondary | ICD-10-CM | POA: Diagnosis not present

## 2014-03-02 DIAGNOSIS — K219 Gastro-esophageal reflux disease without esophagitis: Secondary | ICD-10-CM | POA: Diagnosis not present

## 2014-03-02 DIAGNOSIS — F411 Generalized anxiety disorder: Secondary | ICD-10-CM | POA: Diagnosis not present

## 2014-03-02 DIAGNOSIS — R634 Abnormal weight loss: Secondary | ICD-10-CM | POA: Diagnosis not present

## 2014-03-02 DIAGNOSIS — Z87898 Personal history of other specified conditions: Secondary | ICD-10-CM | POA: Diagnosis not present

## 2014-03-02 DIAGNOSIS — R5381 Other malaise: Secondary | ICD-10-CM | POA: Diagnosis not present

## 2014-04-12 ENCOUNTER — Other Ambulatory Visit: Payer: Self-pay | Admitting: Internal Medicine

## 2014-04-12 DIAGNOSIS — G4719 Other hypersomnia: Secondary | ICD-10-CM | POA: Insufficient documentation

## 2014-04-12 MED ORDER — CYCLOBENZAPRINE HCL 10 MG PO TABS: ORAL_TABLET | ORAL | Status: DC

## 2014-04-12 MED ORDER — MODAFINIL 100 MG PO TABS: 100.0000 mg | ORAL_TABLET | Freq: Every day | ORAL | Status: DC

## 2014-04-26 ENCOUNTER — Telehealth: Payer: Self-pay | Admitting: Internal Medicine

## 2014-04-26 NOTE — Telephone Encounter (Signed)
Pt called and and stated that pharmacy has faxed in request for Proventil several times and the 1st time was 3 weeks ago.  She just wanted to check on the status.  Best number to reach her is [phone]    Thank you!!

## 2014-04-26 NOTE — Telephone Encounter (Signed)
PA submitted x 2 and again this am with new ID #.

## 2014-05-04 NOTE — Telephone Encounter (Signed)
Per [employer], [place] denied and pt will be notified of the appeals process per rep.

## 2014-05-07 ENCOUNTER — Telehealth: Payer: Self-pay | Admitting: Internal Medicine

## 2014-05-07 NOTE — Telephone Encounter (Signed)
Spoke  W/ patient.  Told PA was denied.

## 2014-05-07 NOTE — Telephone Encounter (Signed)
Patient would like a call back in regards to PA on pravachol.

## 2014-05-07 NOTE — Telephone Encounter (Signed)
Would like call back in regards to pravachol PA

## 2014-05-20 ENCOUNTER — Ambulatory Visit (INDEPENDENT_AMBULATORY_CARE_PROVIDER_SITE_OTHER): Payer: Medicare Other | Admitting: Cardiology

## 2014-05-20 ENCOUNTER — Encounter: Payer: Self-pay | Admitting: Cardiology

## 2014-05-20 ENCOUNTER — Encounter: Payer: Self-pay | Admitting: *Deleted

## 2014-05-20 VITALS — BP 118/72 | HR 87 | Ht 63.0 in | Wt 163.0 lb

## 2014-05-20 DIAGNOSIS — I059 Rheumatic mitral valve disease, unspecified: Secondary | ICD-10-CM

## 2014-05-20 DIAGNOSIS — I10 Essential (primary) hypertension: Secondary | ICD-10-CM | POA: Diagnosis not present

## 2014-05-20 DIAGNOSIS — E7800 Pure hypercholesterolemia, unspecified: Secondary | ICD-10-CM | POA: Diagnosis not present

## 2014-05-20 DIAGNOSIS — R011 Cardiac murmur, unspecified: Secondary | ICD-10-CM | POA: Diagnosis not present

## 2014-05-20 NOTE — Patient Instructions (Signed)
Your physician has requested that you have an echocardiogram. Echocardiography is a painless test that uses sound waves to create images of your heart. It provides your doctor with information about the size and shape of your heart and how well your heart's chambers and valves are working. This procedure takes approximately one hour. There are no restrictions for this procedure.  Your physician wants you to follow-up in: 2 years with Dr Aundra Dubin. (September 2017).  You will receive a reminder letter in the mail two months in advance. If you don't receive a letter, please call our office to schedule the follow-up appointment.

## 2014-05-21 NOTE — Progress Notes (Signed)
Patient ID: Colleen White, female   DOB: [birth year], 68 y.o.   MRN: [id number] PCP: Dr. Ronnald Ramp  68 yo with history of HTN, hyperlipidemia, and prior non-Hodgkins lymphoma presents for cardiology evaluation.  She was seen in the past by Dr. Verl Blalock though not recently.  She had a Myoview for atypical chest pain in 2005 that was negative.  Her lymphoma is in remission, has been followed for this at [facility].  She is doing well overall, no exertional dyspnea or chest pain.  She is busy caring for her 65 year old mother.  She gets some exercise at the gym.  No lightheadedness or palpitations.  She has been told she has mitral valve prolapse but no echo in system.   ECG: NSR, normal  Labs (4/15): K 3.7, creatinine 0.6, LDL 111, HDL 46  PMH: 1. Mitral valve prolapse 2. Chest pain: Myoview normal in 2/05.  3. Suspected glaucoma 4. HTN 5. Hyperlipidemia 6. GERD 7. Diverticulosis 8. TAH/BSO 9. Non-Hodgkins lymphoma: In remission.  Treated at [facility] with surgery and XRT.  Did not have chemotherapy.  10. Pulmonary nodules: Presumed benign after observation.   SH: Married, cares for elderly mother, nonsmoker.  Lives in [place].  FH: Mother with atrial fibrillation and CAD.   ROS: All systems reviewed and negative except as per HPI.   Current Outpatient Prescriptions  Medication Sig Dispense Refill  . amLODipine (NORVASC) 10 MG tablet Take 0.5 tablets (5 mg total) by mouth daily.  30 tablet  11  . aspirin 81 MG tablet Take 81 mg by mouth daily.        . brimonidine-timolol (COMBIGAN) 0.2-0.5 % ophthalmic solution Place 1 drop into both eyes every 12 (twelve) hours.        . cholecalciferol (VITAMIN D) 1000 UNITS tablet Take 1,000 Units by mouth daily.        . cyclobenzaprine (FLEXERIL) 10 MG tablet TAKE 1 TABLET THREE TIMES DAILY AS NEEDED FOR MUSCLE SPASMS.  50 tablet  3  . docusate sodium (COLACE) 100 MG capsule Take once daily       . esomeprazole (NEXIUM) 40 MG capsule Take 1 capsule (40 mg total)  by mouth daily. 30 minutes before the evening meal  30 capsule  6  . loratadine (CLARITIN) 10 MG tablet Take 10 mg by mouth daily. As needed       . Minoxidil 5 % FOAM Apply 3-5 mLs topically at bedtime. Apply to Scalp for hair loss at bedtime       . mirtazapine (REMERON) 15 MG tablet Take 7.5 mg by mouth at bedtime.       . Multiple Vitamins-Minerals (CENTRUM) tablet Take 1 tablet by mouth daily.        . pravastatin (PRAVACHOL) 40 MG tablet Take 1 tablet (40 mg total) by mouth daily.  30 tablet  6  . telmisartan-hydrochlorothiazide (MICARDIS HCT) 80-12.5 MG per tablet Take 1 tablet by mouth daily.  30 tablet  6  . tretinoin (RETIN-A) 0.05 % cream Once daily        No current facility-administered medications for this visit.    BP 118/72  Pulse 87  Ht 5' 3"  (1.6 m)  Wt 163 lb (73.936 kg)  BMI 28.88 kg/m2 General: NAD Neck: No JVD, no thyromegaly or thyroid nodule.  Lungs: Clear to auscultation bilaterally with normal respiratory effort. CV: Nondisplaced PMI.  Heart regular S1/S2, no S3/S4, 2/6 HSM at apex.  No peripheral edema.  No carotid bruit.  Normal  pedal pulses.  Abdomen: Soft, nontender, no hepatosplenomegaly, no distention.  Skin: Intact without lesions or rashes.  Neurologic: Alert and oriented x 3.  Psych: Normal affect. Extremities: No clubbing or cyanosis.  HEENT: Normal.   Assessment/Plan: 1. HTN: BP is controlled on current regimen, continue.  2. Hyperlipidemia: Reasonable lipids in 4/15.  3. Mitral valve prolapse: MR murmur on exam.  Will get echo to assess.    [name] 05/21/2014

## 2014-05-31 ENCOUNTER — Telehealth: Payer: Self-pay | Admitting: Internal Medicine

## 2014-05-31 DIAGNOSIS — G4719 Other hypersomnia: Secondary | ICD-10-CM

## 2014-05-31 NOTE — Telephone Encounter (Signed)
Pt came by office to request a sleep study to be performed in order to determine if she has sleep apnea. Please contact pt when request is complete.

## 2014-05-31 NOTE — Telephone Encounter (Signed)
referral sent

## 2014-06-08 DIAGNOSIS — Z01419 Encounter for gynecological examination (general) (routine) without abnormal findings: Secondary | ICD-10-CM | POA: Diagnosis not present

## 2014-06-08 DIAGNOSIS — Z124 Encounter for screening for malignant neoplasm of cervix: Secondary | ICD-10-CM | POA: Diagnosis not present

## 2014-06-08 DIAGNOSIS — Z803 Family history of malignant neoplasm of breast: Secondary | ICD-10-CM | POA: Diagnosis not present

## 2014-06-08 DIAGNOSIS — R3919 Other difficulties with micturition: Secondary | ICD-10-CM | POA: Diagnosis not present

## 2014-06-08 DIAGNOSIS — R3989 Other symptoms and signs involving the genitourinary system: Secondary | ICD-10-CM | POA: Diagnosis not present

## 2014-06-11 DIAGNOSIS — H52203 Unspecified astigmatism, bilateral: Secondary | ICD-10-CM | POA: Diagnosis not present

## 2014-06-11 DIAGNOSIS — H40013 Open angle with borderline findings, low risk, bilateral: Secondary | ICD-10-CM | POA: Diagnosis not present

## 2014-06-11 DIAGNOSIS — H04123 Dry eye syndrome of bilateral lacrimal glands: Secondary | ICD-10-CM | POA: Diagnosis not present

## 2014-06-11 DIAGNOSIS — H40053 Ocular hypertension, bilateral: Secondary | ICD-10-CM | POA: Diagnosis not present

## 2014-06-16 ENCOUNTER — Ambulatory Visit (HOSPITAL_COMMUNITY): Payer: Medicare Other | Attending: Cardiology

## 2014-06-16 DIAGNOSIS — R010 Benign and innocent cardiac murmurs: Secondary | ICD-10-CM | POA: Diagnosis not present

## 2014-06-16 DIAGNOSIS — I10 Essential (primary) hypertension: Secondary | ICD-10-CM | POA: Insufficient documentation

## 2014-06-16 DIAGNOSIS — Z87891 Personal history of nicotine dependence: Secondary | ICD-10-CM | POA: Diagnosis not present

## 2014-06-16 DIAGNOSIS — E785 Hyperlipidemia, unspecified: Secondary | ICD-10-CM | POA: Insufficient documentation

## 2014-06-16 DIAGNOSIS — R011 Cardiac murmur, unspecified: Secondary | ICD-10-CM

## 2014-06-16 NOTE — Progress Notes (Signed)
2D Echo completed. 06/16/2014

## 2014-06-21 ENCOUNTER — Other Ambulatory Visit: Payer: Self-pay | Admitting: Pulmonary Disease

## 2014-06-21 ENCOUNTER — Telehealth: Payer: Self-pay | Admitting: Cardiology

## 2014-06-21 NOTE — Telephone Encounter (Signed)
Spoke with patient about recent echo results

## 2014-06-21 NOTE — Telephone Encounter (Signed)
New problem   Pt returning call concerning Echocardiogram results. Please call pt.

## 2014-06-24 ENCOUNTER — Other Ambulatory Visit: Payer: Self-pay

## 2014-06-24 DIAGNOSIS — R928 Other abnormal and inconclusive findings on diagnostic imaging of breast: Secondary | ICD-10-CM | POA: Diagnosis not present

## 2014-06-24 DIAGNOSIS — Z803 Family history of malignant neoplasm of breast: Secondary | ICD-10-CM | POA: Diagnosis not present

## 2014-06-24 DIAGNOSIS — Z9189 Other specified personal risk factors, not elsewhere classified: Secondary | ICD-10-CM | POA: Diagnosis not present

## 2014-06-24 MED ORDER — TELMISARTAN-HCTZ 80-12.5 MG PO TABS: 1.0000 | ORAL_TABLET | Freq: Every day | ORAL | Status: DC

## 2014-07-06 ENCOUNTER — Encounter: Payer: Self-pay | Admitting: *Deleted

## 2014-07-06 ENCOUNTER — Encounter: Payer: Self-pay | Admitting: Cardiovascular Disease

## 2014-07-06 ENCOUNTER — Ambulatory Visit (INDEPENDENT_AMBULATORY_CARE_PROVIDER_SITE_OTHER): Payer: Medicare Other | Admitting: Cardiovascular Disease

## 2014-07-06 VITALS — BP 126/86 | HR 75 | Ht 63.0 in | Wt 165.5 lb

## 2014-07-06 DIAGNOSIS — R079 Chest pain, unspecified: Secondary | ICD-10-CM

## 2014-07-06 NOTE — Progress Notes (Signed)
Pt walked in to office today requesting appt with Dr Aundra Dubin. Pt states earlier today while walking on the treadmill she developed chest pain/tightness/ slight dizziness. She states she walks on the treadmill regularly at the gym a couple of times per week. She denies experiencing any symptoms like this in the past.  Pt states she drove home, took 2 of a 3m aspirin and 1 of her husband's nitroglycerin SL. She states her symptoms gradually improved and completely resolved over the next hour.  Pt is asymptomatic at this time. EKG done and reviewed by Dr CBurt Knack  Dr CBurt Knackrecommended stress echo, this has been ordered and will be scheduled. Pt advised to call 911 immediately if symptoms return.  Pt verbalized understanding.   ADDENDUM: I personally evaluated the patient with ADesiree Lucy RN. Her EKG is normal and chest pain has resolved. Have recommended an exercise stress echocardiogram to evaluate for ischemic heart disease. Advised to call 911 if recurrent sx's or prolonged pain > 10-15 minutes.  MSherren MochaMD 07/06/2014 5:31 PM

## 2014-07-09 ENCOUNTER — Ambulatory Visit (HOSPITAL_COMMUNITY): Payer: Medicare Other | Attending: Cardiovascular Disease

## 2014-07-09 ENCOUNTER — Other Ambulatory Visit (HOSPITAL_COMMUNITY): Payer: Medicare Other

## 2014-07-09 DIAGNOSIS — R010 Benign and innocent cardiac murmurs: Secondary | ICD-10-CM | POA: Diagnosis not present

## 2014-07-09 DIAGNOSIS — R079 Chest pain, unspecified: Secondary | ICD-10-CM | POA: Diagnosis not present

## 2014-07-09 DIAGNOSIS — R070 Pain in throat: Secondary | ICD-10-CM | POA: Diagnosis not present

## 2014-07-09 NOTE — Progress Notes (Signed)
Stress echo completed 07/09/2014

## 2014-07-20 ENCOUNTER — Other Ambulatory Visit: Payer: Self-pay | Admitting: Pulmonary Disease

## 2014-07-29 ENCOUNTER — Institutional Professional Consult (permissible substitution): Payer: Medicare Other | Admitting: Internal Medicine

## 2014-08-19 ENCOUNTER — Ambulatory Visit: Payer: Medicare Other

## 2014-08-26 ENCOUNTER — Other Ambulatory Visit: Payer: Self-pay | Admitting: Pulmonary Disease

## 2014-08-26 ENCOUNTER — Ambulatory Visit: Payer: Medicare Other

## 2014-08-26 ENCOUNTER — Ambulatory Visit (INDEPENDENT_AMBULATORY_CARE_PROVIDER_SITE_OTHER): Payer: Medicare Other | Admitting: *Deleted

## 2014-08-26 DIAGNOSIS — Z23 Encounter for immunization: Secondary | ICD-10-CM | POA: Diagnosis not present

## 2014-08-30 ENCOUNTER — Other Ambulatory Visit: Payer: Self-pay

## 2014-08-30 MED ORDER — PRAVASTATIN SODIUM 40 MG PO TABS: 40.0000 mg | ORAL_TABLET | Freq: Every day | ORAL | Status: DC

## 2014-09-15 ENCOUNTER — Institutional Professional Consult (permissible substitution): Payer: Medicare Other | Admitting: Internal Medicine

## 2014-09-16 ENCOUNTER — Institutional Professional Consult (permissible substitution): Payer: Medicare Other | Admitting: Internal Medicine

## 2014-09-29 ENCOUNTER — Other Ambulatory Visit: Payer: Self-pay | Admitting: Internal Medicine

## 2014-09-29 DIAGNOSIS — I10 Essential (primary) hypertension: Secondary | ICD-10-CM

## 2014-09-29 MED ORDER — AMLODIPINE BESYLATE 10 MG PO TABS: 5.0000 mg | ORAL_TABLET | Freq: Every day | ORAL | Status: DC

## 2014-10-26 ENCOUNTER — Institutional Professional Consult (permissible substitution): Payer: Medicare Other | Admitting: Internal Medicine

## 2014-11-08 ENCOUNTER — Encounter: Payer: Medicare Other | Admitting: Internal Medicine

## 2014-11-25 ENCOUNTER — Ambulatory Visit (INDEPENDENT_AMBULATORY_CARE_PROVIDER_SITE_OTHER): Payer: Medicare Other | Admitting: Emergency Medicine

## 2014-11-25 VITALS — BP 124/76 | HR 76 | Temp 98.3°F | Resp 17 | Ht 63.5 in | Wt 164.0 lb

## 2014-11-25 DIAGNOSIS — J209 Acute bronchitis, unspecified: Secondary | ICD-10-CM

## 2014-11-25 MED ORDER — HYDROCOD POLST-CHLORPHEN POLST 10-8 MG/5ML PO LQCR: 5.0000 mL | Freq: Two times a day (BID) | ORAL | Status: DC | PRN

## 2014-11-25 MED ORDER — CLARITHROMYCIN 500 MG PO TABS: 500.0000 mg | ORAL_TABLET | Freq: Two times a day (BID) | ORAL | Status: DC

## 2014-11-25 NOTE — Progress Notes (Signed)
[facility] [address] [place] [address] [phone]  Date:  11/25/2014   Name:  Colleen White   DOB:  May 09, 1946   MRN:  [id number]  PCP:  Scarlette Calico, MD    Chief Complaint: Sore Throat; Laryngitis; and Nasal Congestion   History of Present Illness:  Colleen White is a 69 y.o. very pleasant female patient who presents with the following:  Ill since two days ago when developed nasal congestion and mucoid drainage Cough productive scant purulent sputum No wheezing or shortness of breath Sore throat No nausea or vomiting. No fever but chilled. No stool change or rash No improvement with over the counter medications or other home remedies.  Denies other complaint or health concern today.   Patient Active Problem List   Diagnosis Date Noted  . Excessive daytime sleepiness 04/12/2014  . Routine general medical examination at a health care facility 11/30/2013  . Cough 11/30/2013  . LYMPHOMA 10/20/2010  . GLAUCOMA 08/04/2008  . ALLERGIC RHINITIS 12/17/2007  . PULMONARY NODULE 12/17/2007  . OSTEOPENIA 12/17/2007  . HYPERCHOLESTEROLEMIA 07/31/2007  . Essential hypertension 07/31/2007  . MITRAL VALVE PROLAPSE 07/31/2007  . GERD 07/31/2007    Past Medical History  Diagnosis Date  . Other malignant lymphomas, unspecified site, extranodal and solid organ sites   . Unspecified glaucoma   . Allergic rhinitis, cause unspecified   . Pulmonary nodule   . Unspecified essential hypertension   . Mitral valve disorders   . Pure hypercholesterolemia   . Esophageal reflux   . Diverticulosis of colon (without mention of hemorrhage)   . Colon polyps   . Hemorrhoids   . Fibrocystic breast disease   . Osteopenia   . Anxiety   . Alopecia   . Headache(784.0)   . Acid reflux   . High cholesterol     Past Surgical History  Procedure Laterality Date  . S/p tah/bso    . S/p orif prox phalanx  02/2007    right little finger----Dr Fredna Dow  . S/p  elap----follicular lymphoma  62/1308    4cm mesenteric mass, Dr. Cloyd Stagers, [facility]  . Abdominal hysterectomy  1996  . Eye surgery  2009    Cataract    History  Substance Use Topics  . Smoking status: Former Smoker    Quit date: 08/27/1974  . Smokeless tobacco: Never Used  . Alcohol Use: No     Comment: occasionally    Family History  Problem Relation Age of Onset  . Hypertension Mother   . Cancer Father     throat  . Cancer Sister     breast (sister)    No Known Allergies  Medication list has been reviewed and updated.  Current Outpatient Prescriptions on File Prior to Visit  Medication Sig Dispense Refill  . amLODipine (NORVASC) 10 MG tablet Take 0.5 tablets (5 mg total) by mouth daily. 30 tablet 11  . aspirin 81 MG tablet Take 81 mg by mouth daily.      . brimonidine-timolol (COMBIGAN) 0.2-0.5 % ophthalmic solution Place 1 drop into both eyes every 12 (twelve) hours.      . cholecalciferol (VITAMIN D) 1000 UNITS tablet Take 1,000 Units by mouth daily.      . cyclobenzaprine (FLEXERIL) 10 MG tablet TAKE 1 TABLET THREE TIMES DAILY AS NEEDED FOR MUSCLE SPASMS. 50 tablet 3  . docusate sodium (COLACE) 100 MG capsule Take once daily     . esomeprazole (NEXIUM) 40 MG capsule Take 1  capsule (40 mg total) by mouth daily. 30 minutes before the evening meal 30 capsule 6  . loratadine (CLARITIN) 10 MG tablet Take 10 mg by mouth daily. As needed     . Minoxidil 5 % FOAM Apply 3-5 mLs topically at bedtime. Apply to Scalp for hair loss at bedtime     . mirtazapine (REMERON) 15 MG tablet Take 7.5 mg by mouth at bedtime.     . Multiple Vitamins-Minerals (CENTRUM) tablet Take 1 tablet by mouth daily.      . pravastatin (PRAVACHOL) 40 MG tablet Take 1 tablet (40 mg total) by mouth daily. 30 tablet 5  . telmisartan-hydrochlorothiazide (MICARDIS HCT) 80-12.5 MG per tablet Take 1 tablet by mouth daily. 30 tablet 11  . tretinoin (RETIN-A) 0.05 % cream Once daily      No current  facility-administered medications on file prior to visit.    Review of Systems:  As per HPI, otherwise negative.    Physical Examination: Filed Vitals:   11/25/14 0814  BP: 124/76  Pulse: 76  Temp: 98.3 F (36.8 C)  Resp: 17   Filed Vitals:   11/25/14 0814  Height: 5' 3.5" (1.613 m)  Weight: 164 lb (74.39 kg)   Body mass index is 28.59 kg/(m^2). Ideal Body Weight: Weight in (lb) to have BMI = 25: 143.1  GEN: WDWN, NAD, Non-toxic, A & O x 3 HEENT: Atraumatic, Normocephalic. Neck supple. No masses, No LAD. Ears and Nose: No external deformity. CV: RRR, No M/G/R. No JVD. No thrill. No extra heart sounds. PULM: CTA B, no wheezes, crackles, rhonchi. No retractions. No resp. distress. No accessory muscle use. ABD: S, NT, ND, +BS. No rebound. No HSM. EXTR: No c/c/e NEURO Normal gait.  PSYCH: Normally interactive. Conversant. Not depressed or anxious appearing.  Calm demeanor.    Assessment and Plan: Bronchitis biaxin tussionex   Signed,  Ellison Carwin, MD

## 2014-11-25 NOTE — Patient Instructions (Signed)

## 2014-12-08 DIAGNOSIS — Z961 Presence of intraocular lens: Secondary | ICD-10-CM | POA: Diagnosis not present

## 2014-12-08 DIAGNOSIS — H3531 Nonexudative age-related macular degeneration: Secondary | ICD-10-CM | POA: Diagnosis not present

## 2014-12-08 DIAGNOSIS — H40053 Ocular hypertension, bilateral: Secondary | ICD-10-CM | POA: Diagnosis not present

## 2014-12-15 ENCOUNTER — Encounter: Payer: Self-pay | Admitting: Internal Medicine

## 2014-12-15 ENCOUNTER — Ambulatory Visit (INDEPENDENT_AMBULATORY_CARE_PROVIDER_SITE_OTHER)
Admission: RE | Admit: 2014-12-15 | Discharge: 2014-12-15 | Disposition: A | Discharge status: Home / Self Care | Payer: Medicare Other | Source: Ambulatory Visit | Attending: Internal Medicine | Admitting: Internal Medicine

## 2014-12-15 ENCOUNTER — Ambulatory Visit (INDEPENDENT_AMBULATORY_CARE_PROVIDER_SITE_OTHER): Payer: Medicare Other | Admitting: Internal Medicine

## 2014-12-15 ENCOUNTER — Other Ambulatory Visit (INDEPENDENT_AMBULATORY_CARE_PROVIDER_SITE_OTHER): Payer: Medicare Other

## 2014-12-15 VITALS — BP 122/72 | HR 73 | Temp 98.2°F | Resp 16 | Ht 63.5 in | Wt 161.5 lb

## 2014-12-15 DIAGNOSIS — Z23 Encounter for immunization: Secondary | ICD-10-CM | POA: Diagnosis not present

## 2014-12-15 DIAGNOSIS — R300 Dysuria: Secondary | ICD-10-CM

## 2014-12-15 DIAGNOSIS — E7800 Pure hypercholesterolemia, unspecified: Secondary | ICD-10-CM

## 2014-12-15 DIAGNOSIS — I10 Essential (primary) hypertension: Secondary | ICD-10-CM | POA: Diagnosis not present

## 2014-12-15 DIAGNOSIS — R05 Cough: Secondary | ICD-10-CM

## 2014-12-15 DIAGNOSIS — Z0000 Encounter for general adult medical examination without abnormal findings: Secondary | ICD-10-CM | POA: Diagnosis not present

## 2014-12-15 DIAGNOSIS — K219 Gastro-esophageal reflux disease without esophagitis: Secondary | ICD-10-CM

## 2014-12-15 DIAGNOSIS — R059 Cough, unspecified: Secondary | ICD-10-CM

## 2014-12-15 LAB — COMPREHENSIVE METABOLIC PANEL
ALT: 20 U/L (ref 0–35)
AST: 21 U/L (ref 0–37)
Albumin: 4.1 g/dL (ref 3.5–5.2)
Alkaline Phosphatase: 92 U/L (ref 39–117)
BUN: 12 mg/dL (ref 6–23)
CO2: 30 mEq/L (ref 19–32)
Calcium: 9.4 mg/dL (ref 8.4–10.5)
Chloride: 104 mEq/L (ref 96–112)
Creatinine, Ser: 0.69 mg/dL (ref 0.40–1.20)
GFR: 89.36 mL/min (ref >60.00)
Glucose, Bld: 93 mg/dL (ref 70–99)
Potassium: 3.8 mEq/L (ref 3.5–5.1)
Sodium: 140 mEq/L (ref 135–145)
Total Bilirubin: 0.7 mg/dL (ref 0.2–1.2)
Total Protein: 6.8 g/dL (ref 6.0–8.3)

## 2014-12-15 LAB — CBC WITH DIFFERENTIAL/PLATELET
Basophils Absolute: 0 10*3/uL (ref 0.0–0.1)
Basophils Relative: 0.3 % (ref 0.0–3.0)
Eosinophils Absolute: 0.1 10*3/uL (ref 0.0–0.7)
Eosinophils Relative: 2.4 % (ref 0.0–5.0)
HCT: 37.8 % (ref 36.0–46.0)
Hemoglobin: 13.1 g/dL (ref 12.0–15.0)
Lymphocytes Relative: 22.6 % (ref 12.0–46.0)
Lymphs Abs: 0.9 10*3/uL (ref 0.7–4.0)
MCHC: 34.5 g/dL (ref 30.0–36.0)
MCV: 89.9 fl (ref 78.0–100.0)
Monocytes Absolute: 0.4 10*3/uL (ref 0.1–1.0)
Monocytes Relative: 9.7 % (ref 3.0–12.0)
Neutro Abs: 2.7 10*3/uL (ref 1.4–7.7)
Neutrophils Relative %: 65 % (ref 43.0–77.0)
Platelets: 214 10*3/uL (ref 150.0–400.0)
RBC: 4.21 Mil/uL (ref 3.87–5.11)
RDW: 12.9 % (ref 11.5–15.5)
WBC: 4.1 10*3/uL (ref 4.0–10.5)

## 2014-12-15 LAB — URINALYSIS, ROUTINE W REFLEX MICROSCOPIC
Bilirubin Urine: NEGATIVE
Ketones, ur: NEGATIVE
Leukocytes, UA: NEGATIVE
Nitrite: NEGATIVE
Specific Gravity, Urine: 1.01 (ref 1.000–1.030)
Total Protein, Urine: NEGATIVE
Urine Glucose: NEGATIVE
Urobilinogen, UA: 0.2 (ref 0.0–1.0)
pH: 6.5 (ref 5.0–8.0)

## 2014-12-15 LAB — LIPID PANEL
Cholesterol: 153 mg/dL (ref 0–200)
HDL: 41.3 mg/dL (ref >39.00)
LDL Cholesterol: 89 mg/dL (ref 0–99)
NonHDL: 111.7
Total CHOL/HDL Ratio: 4
Triglycerides: 114 mg/dL (ref 0.0–149.0)
VLDL: 22.8 mg/dL (ref 0.0–40.0)

## 2014-12-15 LAB — TSH: TSH: 1.26 u[IU]/mL (ref 0.35–4.50)

## 2014-12-15 MED ORDER — HYDROCOD POLST-CPM POLST ER 10-8 MG/5ML PO SUER: 5.0000 mL | Freq: Two times a day (BID) | ORAL | Status: DC | PRN

## 2014-12-15 NOTE — Progress Notes (Signed)
Pre visit review using our clinic review tool, if applicable. No additional management support is needed unless otherwise documented below in the visit note. 

## 2014-12-15 NOTE — Patient Instructions (Addendum)
Cough, Adult  A cough is a reflex that helps clear your throat and airways. It can help heal the body or may be a reaction to an irritated airway. A cough may only last 2 or 3 weeks (acute) or may last more than 8 weeks (chronic).  CAUSES Acute cough:  Viral or bacterial infections. Chronic cough:  Infections.  Allergies.  Asthma.  Post-nasal drip.  Smoking.  Heartburn or acid reflux.  Some medicines.  Chronic lung problems (COPD).  Cancer. SYMPTOMS   Cough.  Fever.  Chest pain.  Increased breathing rate.  High-pitched whistling sound when breathing (wheezing).  Colored mucus that you cough up (sputum). TREATMENT   A bacterial cough may be treated with antibiotic medicine.  A viral cough must run its course and will not respond to antibiotics.  Your caregiver may recommend other treatments if you have a chronic cough. HOME CARE INSTRUCTIONS   Only take over-the-counter or prescription medicines for pain, discomfort, or fever as directed by your caregiver. Use cough suppressants only as directed by your caregiver.  Use a cold steam vaporizer or humidifier in your bedroom or home to help loosen secretions.  Sleep in a semi-upright position if your cough is worse at night.  Rest as needed.  Stop smoking if you smoke. SEEK IMMEDIATE MEDICAL CARE IF:   You have pus in your sputum.  Your cough starts to worsen.  You cannot control your cough with suppressants and are losing sleep.  You begin coughing up blood.  You have difficulty breathing.  You develop pain which is getting worse or is uncontrolled with medicine.  You have a fever. MAKE SURE YOU:   Understand these instructions.  Will watch your condition.  Will get help right away if you are not doing well or get worse. Document Released: 02/09/2011 Document Revised: 11/05/2011 Document Reviewed: 02/09/2011 [facility] Patient Information 2015 [place], [place]. This information is not intended  to replace advice given to you by your health care provider. Make sure you discuss any questions you have with your health care provider. Preventive Care for Adults A healthy lifestyle and preventive care can promote health and wellness. Preventive health guidelines for women include the following key practices.  A routine yearly physical is a good way to check with your health care provider about your health and preventive screening. It is a chance to share any concerns and updates on your health and to receive a thorough exam.  Visit your dentist for a routine exam and preventive care every 6 months. Brush your teeth twice a day and floss once a day. Good oral hygiene prevents tooth decay and gum disease.  The frequency of eye exams is based on your age, health, family medical history, use of contact lenses, and other factors. Follow your health care provider's recommendations for frequency of eye exams.  Eat a healthy diet. Foods like vegetables, fruits, whole grains, low-fat dairy products, and lean protein foods contain the nutrients you need without too many calories. Decrease your intake of foods high in solid fats, added sugars, and salt. Eat the right amount of calories for you.Get information about a proper diet from your health care provider, if necessary.  Regular physical exercise is one of the most important things you can do for your health. Most adults should get at least 150 minutes of moderate-intensity exercise (any activity that increases your heart rate and causes you to sweat) each week. In addition, most adults need muscle-strengthening exercises on 2 or  more days a week.  Maintain a healthy weight. The body mass index (BMI) is a screening tool to identify possible weight problems. It provides an estimate of body fat based on height and weight. Your health care provider can find your BMI and can help you achieve or maintain a healthy weight.For adults 20 years and older:  A  BMI below 18.5 is considered underweight.  A BMI of 18.5 to 24.9 is normal.  A BMI of 25 to 29.9 is considered overweight.  A BMI of 30 and above is considered obese.  Maintain normal blood lipids and cholesterol levels by exercising and minimizing your intake of saturated fat. Eat a balanced diet with plenty of fruit and vegetables. Blood tests for lipids and cholesterol should begin at age 74 and be repeated every 5 years. If your lipid or cholesterol levels are high, you are over 50, or you are at high risk for heart disease, you may need your cholesterol levels checked more frequently.Ongoing high lipid and cholesterol levels should be treated with medicines if diet and exercise are not working.  If you smoke, find out from your health care provider how to quit. If you do not use tobacco, do not start.  Lung cancer screening is recommended for adults aged 80-80 years who are at high risk for developing lung cancer because of a history of smoking. A yearly low-dose CT scan of the lungs is recommended for people who have at least a 30-pack-year history of smoking and are a current smoker or have quit within the past 15 years. A pack year of smoking is smoking an average of 1 pack of cigarettes a day for 1 year (for example: 1 pack a day for 30 years or 2 packs a day for 15 years). Yearly screening should continue until the smoker has stopped smoking for at least 15 years. Yearly screening should be stopped for people who develop a health problem that would prevent them from having lung cancer treatment.  If you are pregnant, do not drink alcohol. If you are breastfeeding, be very cautious about drinking alcohol. If you are not pregnant and choose to drink alcohol, do not have more than 1 drink per day. One drink is considered to be 12 ounces (355 mL) of beer, 5 ounces (148 mL) of wine, or 1.5 ounces (44 mL) of liquor.  Avoid use of street drugs. Do not share needles with anyone. Ask for help if  you need support or instructions about stopping the use of drugs.  High blood pressure causes heart disease and increases the risk of stroke. Your blood pressure should be checked at least every 1 to 2 years. Ongoing high blood pressure should be treated with medicines if weight loss and exercise do not work.  If you are 84-22 years old, ask your health care provider if you should take aspirin to prevent strokes.  Diabetes screening involves taking a blood sample to check your fasting blood sugar level. This should be done once every 3 years, after age 23, if you are within normal weight and without risk factors for diabetes. Testing should be considered at a younger age or be carried out more frequently if you are overweight and have at least 1 risk factor for diabetes.  Breast cancer screening is essential preventive care for women. You should practice "breast self-awareness." This means understanding the normal appearance and feel of your breasts and may include breast self-examination. Any changes detected, no matter how small, should  be reported to a health care provider. Women in their 7s and 30s should have a clinical breast exam (CBE) by a health care provider as part of a regular health exam every 1 to 3 years. After age 20, women should have a CBE every year. Starting at age 84, women should consider having a mammogram (breast X-ray test) every year. Women who have a family history of breast cancer should talk to their health care provider about genetic screening. Women at a high risk of breast cancer should talk to their health care providers about having an MRI and a mammogram every year.  Breast cancer gene (BRCA)-related cancer risk assessment is recommended for women who have family members with BRCA-related cancers. BRCA-related cancers include breast, ovarian, tubal, and peritoneal cancers. Having family members with these cancers may be associated with an increased risk for harmful  changes (mutations) in the breast cancer genes BRCA1 and BRCA2. Results of the assessment will determine the need for genetic counseling and BRCA1 and BRCA2 testing.  Routine pelvic exams to screen for cancer are no longer recommended for nonpregnant women who are considered low risk for cancer of the pelvic organs (ovaries, uterus, and vagina) and who do not have symptoms. Ask your health care provider if a screening pelvic exam is right for you.  If you have had past treatment for cervical cancer or a condition that could lead to cancer, you need Pap tests and screening for cancer for at least 20 years after your treatment. If Pap tests have been discontinued, your risk factors (such as having a new sexual partner) need to be reassessed to determine if screening should be resumed. Some women have medical problems that increase the chance of getting cervical cancer. In these cases, your health care provider may recommend more frequent screening and Pap tests.  The HPV test is an additional test that may be used for cervical cancer screening. The HPV test looks for the virus that can cause the cell changes on the cervix. The cells collected during the Pap test can be tested for HPV. The HPV test could be used to screen women aged 95 years and older, and should be used in women of any age who have unclear Pap test results. After the age of 62, women should have HPV testing at the same frequency as a Pap test.  Colorectal cancer can be detected and often prevented. Most routine colorectal cancer screening begins at the age of 64 years and continues through age 1 years. However, your health care provider may recommend screening at an earlier age if you have risk factors for colon cancer. On a yearly basis, your health care provider may provide home test kits to check for hidden blood in the stool. Use of a small camera at the end of a tube, to directly examine the colon (sigmoidoscopy or colonoscopy), can  detect the earliest forms of colorectal cancer. Talk to your health care provider about this at age 61, when routine screening begins. Direct exam of the colon should be repeated every 5-10 years through age 10 years, unless early forms of pre-cancerous polyps or small growths are found.  People who are at an increased risk for hepatitis B should be screened for this virus. You are considered at high risk for hepatitis B if:  You were born in a country where hepatitis B occurs often. Talk with your health care provider about which countries are considered high risk.  Your parents were born in  a high-risk country and you have not received a shot to protect against hepatitis B (hepatitis B vaccine).  You have HIV or AIDS.  You use needles to inject street drugs.  You live with, or have sex with, someone who has hepatitis B.  You get hemodialysis treatment.  You take certain medicines for conditions like cancer, organ transplantation, and autoimmune conditions.  Hepatitis C blood testing is recommended for all people born from [birth year] through 1966 and any individual with known risks for hepatitis C.  Practice safe sex. Use condoms and avoid high-risk sexual practices to reduce the spread of sexually transmitted infections (STIs). STIs include gonorrhea, chlamydia, syphilis, trichomonas, herpes, HPV, and human immunodeficiency virus (HIV). Herpes, HIV, and HPV are viral illnesses that have no cure. They can result in disability, cancer, and death.  You should be screened for sexually transmitted illnesses (STIs) including gonorrhea and chlamydia if:  You are sexually active and are younger than 24 years.  You are older than 24 years and your health care provider tells you that you are at risk for this type of infection.  Your sexual activity has changed since you were last screened and you are at an increased risk for chlamydia or gonorrhea. Ask your health care provider if you are at  risk.  If you are at risk of being infected with HIV, it is recommended that you take a prescription medicine daily to prevent HIV infection. This is called preexposure prophylaxis (PrEP). You are considered at risk if:  You are a heterosexual woman, are sexually active, and are at increased risk for HIV infection.  You take drugs by injection.  You are sexually active with a partner who has HIV.  Talk with your health care provider about whether you are at high risk of being infected with HIV. If you choose to begin PrEP, you should first be tested for HIV. You should then be tested every 3 months for as long as you are taking PrEP.  Osteoporosis is a disease in which the bones lose minerals and strength with aging. This can result in serious bone fractures or breaks. The risk of osteoporosis can be identified using a bone density scan. Women ages 39 years and over and women at risk for fractures or osteoporosis should discuss screening with their health care providers. Ask your health care provider whether you should take a calcium supplement or vitamin D to reduce the rate of osteoporosis.  Menopause can be associated with physical symptoms and risks. Hormone replacement therapy is available to decrease symptoms and risks. You should talk to your health care provider about whether hormone replacement therapy is right for you.  Use sunscreen. Apply sunscreen liberally and repeatedly throughout the day. You should seek shade when your shadow is shorter than you. Protect yourself by wearing long sleeves, pants, a wide-brimmed hat, and sunglasses year round, whenever you are outdoors.  Once a month, do a whole body skin exam, using a mirror to look at the skin on your back. Tell your health care provider of new moles, moles that have irregular borders, moles that are larger than a pencil eraser, or moles that have changed in shape or color.  Stay current with required vaccines  (immunizations).  Influenza vaccine. All adults should be immunized every year.  Tetanus, diphtheria, and acellular pertussis (Td, Tdap) vaccine. Pregnant women should receive 1 dose of Tdap vaccine during each pregnancy. The dose should be obtained regardless of the length of time since  the last dose. Immunization is preferred during the 27th-36th week of gestation. An adult who has not previously received Tdap or who does not know her vaccine status should receive 1 dose of Tdap. This initial dose should be followed by tetanus and diphtheria toxoids (Td) booster doses every 10 years. Adults with an unknown or incomplete history of completing a 3-dose immunization series with Td-containing vaccines should begin or complete a primary immunization series including a Tdap dose. Adults should receive a Td booster every 10 years.  Varicella vaccine. An adult without evidence of immunity to varicella should receive 2 doses or a second dose if she has previously received 1 dose. Pregnant females who do not have evidence of immunity should receive the first dose after pregnancy. This first dose should be obtained before leaving the health care facility. The second dose should be obtained 4-8 weeks after the first dose.  Human papillomavirus (HPV) vaccine. Females aged 13-26 years who have not received the vaccine previously should obtain the 3-dose series. The vaccine is not recommended for use in pregnant females. However, pregnancy testing is not needed before receiving a dose. If a female is found to be pregnant after receiving a dose, no treatment is needed. In that case, the remaining doses should be delayed until after the pregnancy. Immunization is recommended for any person with an immunocompromised condition through the age of 9 years if she did not get any or all doses earlier. During the 3-dose series, the second dose should be obtained 4-8 weeks after the first dose. The third dose should be obtained  24 weeks after the first dose and 16 weeks after the second dose.  Zoster vaccine. One dose is recommended for adults aged 58 years or older unless certain conditions are present.  Measles, mumps, and rubella (MMR) vaccine. Adults born before [birth year] generally are considered immune to measles and mumps. Adults born in [birth year] or later should have 1 or more doses of MMR vaccine unless there is a contraindication to the vaccine or there is laboratory evidence of immunity to each of the three diseases. A routine second dose of MMR vaccine should be obtained at least 28 days after the first dose for students attending postsecondary schools, health care workers, or international travelers. People who received inactivated measles vaccine or an unknown type of measles vaccine during 1963-1967 should receive 2 doses of MMR vaccine. People who received inactivated mumps vaccine or an unknown type of mumps vaccine before 1979 and are at high risk for mumps infection should consider immunization with 2 doses of MMR vaccine. For females of childbearing age, rubella immunity should be determined. If there is no evidence of immunity, females who are not pregnant should be vaccinated. If there is no evidence of immunity, females who are pregnant should delay immunization until after pregnancy. Unvaccinated health care workers born before [birth year] who lack laboratory evidence of measles, mumps, or rubella immunity or laboratory confirmation of disease should consider measles and mumps immunization with 2 doses of MMR vaccine or rubella immunization with 1 dose of MMR vaccine.  Pneumococcal 13-valent conjugate (PCV13) vaccine. When indicated, a person who is uncertain of her immunization history and has no record of immunization should receive the PCV13 vaccine. An adult aged 41 years or older who has certain medical conditions and has not been previously immunized should receive 1 dose of PCV13 vaccine. This PCV13 should be followed  with a dose of pneumococcal polysaccharide (PPSV23) vaccine. The PPSV23 vaccine dose should  be obtained at least 8 weeks after the dose of PCV13 vaccine. An adult aged 35 years or older who has certain medical conditions and previously received 1 or more doses of PPSV23 vaccine should receive 1 dose of PCV13. The PCV13 vaccine dose should be obtained 1 or more years after the last PPSV23 vaccine dose.  Pneumococcal polysaccharide (PPSV23) vaccine. When PCV13 is also indicated, PCV13 should be obtained first. All adults aged 56 years and older should be immunized. An adult younger than age 47 years who has certain medical conditions should be immunized. Any person who resides in a nursing home or long-term care facility should be immunized. An adult smoker should be immunized. People with an immunocompromised condition and certain other conditions should receive both PCV13 and PPSV23 vaccines. People with human immunodeficiency virus (HIV) infection should be immunized as soon as possible after diagnosis. Immunization during chemotherapy or radiation therapy should be avoided. Routine use of PPSV23 vaccine is not recommended for American Indians, [place] Natives, or people younger than 65 years unless there are medical conditions that require PPSV23 vaccine. When indicated, people who have unknown immunization and have no record of immunization should receive PPSV23 vaccine. One-time revaccination 5 years after the first dose of PPSV23 is recommended for people aged 19-64 years who have chronic kidney failure, nephrotic syndrome, asplenia, or immunocompromised conditions. People who received 1-2 doses of PPSV23 before age 85 years should receive another dose of PPSV23 vaccine at age 71 years or later if at least 5 years have passed since the previous dose. Doses of PPSV23 are not needed for people immunized with PPSV23 at or after age 63 years.  Meningococcal vaccine. Adults with asplenia or persistent complement  component deficiencies should receive 2 doses of quadrivalent meningococcal conjugate (MenACWY-D) vaccine. The doses should be obtained at least 2 months apart. Microbiologists working with certain meningococcal bacteria, [employer] recruits, people at risk during an outbreak, and people who travel to or live in countries with a high rate of meningitis should be immunized. A first-year college student up through age 65 years who is living in a residence hall should receive a dose if she did not receive a dose on or after her 16th birthday. Adults who have certain high-risk conditions should receive one or more doses of vaccine.  Hepatitis A vaccine. Adults who wish to be protected from this disease, have certain high-risk conditions, work with hepatitis A-infected animals, work in hepatitis A research labs, or travel to or work in countries with a high rate of hepatitis A should be immunized. Adults who were previously unvaccinated and who anticipate close contact with an international adoptee during the first 60 days after arrival in the [place] from a country with a high rate of hepatitis A should be immunized.  Hepatitis B vaccine. Adults who wish to be protected from this disease, have certain high-risk conditions, may be exposed to blood or other infectious body fluids, are household contacts or sex partners of hepatitis B positive people, are clients or workers in certain care facilities, or travel to or work in countries with a high rate of hepatitis B should be immunized.  Haemophilus influenzae type b (Hib) vaccine. A previously unvaccinated person with asplenia or sickle cell disease or having a scheduled splenectomy should receive 1 dose of Hib vaccine. Regardless of previous immunization, a recipient of a hematopoietic stem cell transplant should receive a 3-dose series 6-12 months after her successful transplant. Hib vaccine is not recommended for adults  with HIV infection. Preventive  Services / Frequency Ages 28 to 40 years  Blood pressure check.** / Every 1 to 2 years.  Lipid and cholesterol check.** / Every 5 years beginning at age 4.  Clinical breast exam.** / Every 3 years for women in their 30s and 86s.  BRCA-related cancer risk assessment.** / For women who have family members with a BRCA-related cancer (breast, ovarian, tubal, or peritoneal cancers).  Pap test.** / Every 2 years from ages 16 through 26. Every 3 years starting at age 2 through age 72 or 47 with a history of 3 consecutive normal Pap tests.  HPV screening.** / Every 3 years from ages 7 through ages 16 to 65 with a history of 3 consecutive normal Pap tests.  Hepatitis C blood test.** / For any individual with known risks for hepatitis C.  Skin self-exam. / Monthly.  Influenza vaccine. / Every year.  Tetanus, diphtheria, and acellular pertussis (Tdap, Td) vaccine.** / Consult your health care provider. Pregnant women should receive 1 dose of Tdap vaccine during each pregnancy. 1 dose of Td every 10 years.  Varicella vaccine.** / Consult your health care provider. Pregnant females who do not have evidence of immunity should receive the first dose after pregnancy.  HPV vaccine. / 3 doses over 6 months, if 48 and younger. The vaccine is not recommended for use in pregnant females. However, pregnancy testing is not needed before receiving a dose.  Measles, mumps, rubella (MMR) vaccine.** / You need at least 1 dose of MMR if you were born in 1957 or later. You may also need a 2nd dose. For females of childbearing age, rubella immunity should be determined. If there is no evidence of immunity, females who are not pregnant should be vaccinated. If there is no evidence of immunity, females who are pregnant should delay immunization until after pregnancy.  Pneumococcal 13-valent conjugate (PCV13) vaccine.** / Consult your health care provider.  Pneumococcal polysaccharide (PPSV23) vaccine.** / 1 to 2  doses if you smoke cigarettes or if you have certain conditions.  Meningococcal vaccine.** / 1 dose if you are age 31 to 37 years and a Market researcher living in a residence hall, or have one of several medical conditions, you need to get vaccinated against meningococcal disease. You may also need additional booster doses.  Hepatitis A vaccine.** / Consult your health care provider.  Hepatitis B vaccine.** / Consult your health care provider.  Haemophilus influenzae type b (Hib) vaccine.** / Consult your health care provider. Ages 82 to 45 years  Blood pressure check.** / Every 1 to 2 years.  Lipid and cholesterol check.** / Every 5 years beginning at age 30 years.  Lung cancer screening. / Every year if you are aged 66-80 years and have a 30-pack-year history of smoking and currently smoke or have quit within the past 15 years. Yearly screening is stopped once you have quit smoking for at least 15 years or develop a health problem that would prevent you from having lung cancer treatment.  Clinical breast exam.** / Every year after age 55 years.  BRCA-related cancer risk assessment.** / For women who have family members with a BRCA-related cancer (breast, ovarian, tubal, or peritoneal cancers).  Mammogram.** / Every year beginning at age 28 years and continuing for as long as you are in good health. Consult with your health care provider.  Pap test.** / Every 3 years starting at age 66 years through age 57 or 25 years with a  history of 3 consecutive normal Pap tests.  HPV screening.** / Every 3 years from ages 45 years through ages 72 to 109 years with a history of 3 consecutive normal Pap tests.  Fecal occult blood test (FOBT) of stool. / Every year beginning at age 63 years and continuing until age 49 years. You may not need to do this test if you get a colonoscopy every 10 years.  Flexible sigmoidoscopy or colonoscopy.** / Every 5 years for a flexible sigmoidoscopy or every  10 years for a colonoscopy beginning at age 49 years and continuing until age 70 years.  Hepatitis C blood test.** / For all people born from [birth year] through 1966 and any individual with known risks for hepatitis C.  Skin self-exam. / Monthly.  Influenza vaccine. / Every year.  Tetanus, diphtheria, and acellular pertussis (Tdap/Td) vaccine.** / Consult your health care provider. Pregnant women should receive 1 dose of Tdap vaccine during each pregnancy. 1 dose of Td every 10 years.  Varicella vaccine.** / Consult your health care provider. Pregnant females who do not have evidence of immunity should receive the first dose after pregnancy.  Zoster vaccine.** / 1 dose for adults aged 57 years or older.  Measles, mumps, rubella (MMR) vaccine.** / You need at least 1 dose of MMR if you were born in 1957 or later. You may also need a 2nd dose. For females of childbearing age, rubella immunity should be determined. If there is no evidence of immunity, females who are not pregnant should be vaccinated. If there is no evidence of immunity, females who are pregnant should delay immunization until after pregnancy.  Pneumococcal 13-valent conjugate (PCV13) vaccine.** / Consult your health care provider.  Pneumococcal polysaccharide (PPSV23) vaccine.** / 1 to 2 doses if you smoke cigarettes or if you have certain conditions.  Meningococcal vaccine.** / Consult your health care provider.  Hepatitis A vaccine.** / Consult your health care provider.  Hepatitis B vaccine.** / Consult your health care provider.  Haemophilus influenzae type b (Hib) vaccine.** / Consult your health care provider. Ages 15 years and over  Blood pressure check.** / Every 1 to 2 years.  Lipid and cholesterol check.** / Every 5 years beginning at age 41 years.  Lung cancer screening. / Every year if you are aged 37-80 years and have a 30-pack-year history of smoking and currently smoke or have quit within the past 15 years.  Yearly screening is stopped once you have quit smoking for at least 15 years or develop a health problem that would prevent you from having lung cancer treatment.  Clinical breast exam.** / Every year after age 52 years.  BRCA-related cancer risk assessment.** / For women who have family members with a BRCA-related cancer (breast, ovarian, tubal, or peritoneal cancers).  Mammogram.** / Every year beginning at age 56 years and continuing for as long as you are in good health. Consult with your health care provider.  Pap test.** / Every 3 years starting at age 40 years through age 66 or 47 years with 3 consecutive normal Pap tests. Testing can be stopped between 65 and 70 years with 3 consecutive normal Pap tests and no abnormal Pap or HPV tests in the past 10 years.  HPV screening.** / Every 3 years from ages 26 years through ages 36 or 15 years with a history of 3 consecutive normal Pap tests. Testing can be stopped between 65 and 70 years with 3 consecutive normal Pap tests and no abnormal Pap or  HPV tests in the past 10 years.  Fecal occult blood test (FOBT) of stool. / Every year beginning at age 85 years and continuing until age 51 years. You may not need to do this test if you get a colonoscopy every 10 years.  Flexible sigmoidoscopy or colonoscopy.** / Every 5 years for a flexible sigmoidoscopy or every 10 years for a colonoscopy beginning at age 69 years and continuing until age 15 years.  Hepatitis C blood test.** / For all people born from [birth year] through 1965 and any individual with known risks for hepatitis C.  Osteoporosis screening.** / A one-time screening for women ages 84 years and over and women at risk for fractures or osteoporosis.  Skin self-exam. / Monthly.  Influenza vaccine. / Every year.  Tetanus, diphtheria, and acellular pertussis (Tdap/Td) vaccine.** / 1 dose of Td every 10 years.  Varicella vaccine.** / Consult your health care provider.  Zoster vaccine.** / 1  dose for adults aged 17 years or older.  Pneumococcal 13-valent conjugate (PCV13) vaccine.** / Consult your health care provider.  Pneumococcal polysaccharide (PPSV23) vaccine.** / 1 dose for all adults aged 42 years and older.  Meningococcal vaccine.** / Consult your health care provider.  Hepatitis A vaccine.** / Consult your health care provider.  Hepatitis B vaccine.** / Consult your health care provider.  Haemophilus influenzae type b (Hib) vaccine.** / Consult your health care provider. ** Family history and personal history of risk and conditions may change your health care provider's recommendations. Document Released: 10/09/2001 Document Revised: 12/28/2013 Document Reviewed: 01/08/2011 [facility] Patient Information 2015 [place], [place]. This information is not intended to replace advice given to you by your health care provider. Make sure you discuss any questions you have with your health care provider.

## 2014-12-15 NOTE — Assessment & Plan Note (Signed)
She is doing well on the statin Will recheck her FLP today 

## 2014-12-15 NOTE — Assessment & Plan Note (Signed)
Will check a UA and ur clx to see if there is a UTI that needs to be treated

## 2014-12-15 NOTE — Assessment & Plan Note (Signed)
She appears to have a post-URI persistent cough Will cont tussionex as needed for the cough Will check a CXR to be certain that there is no PNA, mass, or edema

## 2014-12-15 NOTE — Assessment & Plan Note (Signed)

## 2014-12-15 NOTE — Progress Notes (Signed)
Subjective:    Patient ID: Colleen White, female    DOB: 1945/11/01, 69 y.o.   MRN: [id number]  Cough This is a recurrent problem. The current episode started 1 to 4 weeks ago. The problem has been gradually improving. The cough is productive of sputum. Pertinent negatives include no chest pain, chills, ear congestion, ear pain, fever, headaches, heartburn, hemoptysis, myalgias, nasal congestion, postnasal drip, rash, rhinorrhea, sore throat, shortness of breath, sweats, weight loss or wheezing. Nothing aggravates the symptoms. She has tried prescription cough suppressant (biaxin) for the symptoms. The treatment provided moderate relief. Her past medical history is significant for bronchitis. There is no history of asthma, bronchiectasis, COPD, emphysema, environmental allergies or pneumonia.      Review of Systems  Constitutional: Positive for fatigue. Negative for fever, chills, weight loss, diaphoresis and appetite change.  HENT: Negative.  Negative for drooling, ear discharge, ear pain, postnasal drip, rhinorrhea, sore throat and trouble swallowing.   Eyes: Negative.   Respiratory: Positive for cough. Negative for hemoptysis, choking, chest tightness, shortness of breath, wheezing and stridor.   Cardiovascular: Negative.  Negative for chest pain, palpitations and leg swelling.  Gastrointestinal: Negative.  Negative for heartburn, nausea, vomiting, abdominal pain, diarrhea, constipation and blood in stool.  Endocrine: Negative.   Genitourinary: Positive for dysuria. Negative for urgency, frequency, hematuria, decreased urine volume, vaginal bleeding, difficulty urinating and dyspareunia.  Musculoskeletal: Negative.  Negative for myalgias.  Skin: Negative.  Negative for rash.  Allergic/Immunologic: Negative.  Negative for environmental allergies.  Neurological: Negative.  Negative for headaches.  Hematological: Negative.  Negative for adenopathy. Does not bruise/bleed easily.    Psychiatric/Behavioral: Negative.        Objective:   Physical Exam  Constitutional: She is oriented to person, place, and time. She appears well-developed and well-nourished.  Non-toxic appearance. She does not have a sickly appearance. She does not appear ill. No distress.  HENT:  Head: Normocephalic and atraumatic.  Mouth/Throat: Oropharynx is clear and moist. No oropharyngeal exudate.  Eyes: Conjunctivae are normal. Right eye exhibits no discharge. Left eye exhibits no discharge. No scleral icterus.  Neck: Normal range of motion. Neck supple. No JVD present. No tracheal deviation present. No thyromegaly present.  Cardiovascular: Normal rate, regular rhythm, normal heart sounds and intact distal pulses.  Exam reveals no gallop and no friction rub.   No murmur heard. Pulmonary/Chest: Effort normal and breath sounds normal. No stridor. No respiratory distress. She has no wheezes. She has no rales. She exhibits no tenderness.  Abdominal: Soft. Bowel sounds are normal. She exhibits no distension and no mass. There is no tenderness. There is no rebound and no guarding.  Musculoskeletal: Normal range of motion. She exhibits no edema or tenderness.  Lymphadenopathy:    She has no cervical adenopathy.  Neurological: She is oriented to person, place, and time.  Skin: Skin is warm and dry. No rash noted. She is not diaphoretic. No erythema. No pallor.  Psychiatric: She has a normal mood and affect. Her behavior is normal. Judgment and thought content normal.  Vitals reviewed.   Lab Results  Component Value Date   WBC 5.1 12/30/2013   HGB 10.8* 12/30/2013   HCT 33.6* 12/30/2013   PLT 218.0 11/30/2013   GLUCOSE 95 11/30/2013   CHOL 169 11/30/2013   TRIG 60.0 11/30/2013   HDL 45.90 11/30/2013   LDLDIRECT 142.5 09/30/2012   LDLCALC 111* 11/30/2013   ALT 20 11/30/2013   AST 22 11/30/2013   NA 140 11/30/2013  K 3.7 11/30/2013   CL 99 11/30/2013   CREATININE 0.6 11/30/2013   BUN 17  11/30/2013   CO2 32 11/30/2013   TSH 1.08 11/30/2013   HGBA1C 5.7 08/09/2009        Assessment & Plan:

## 2014-12-15 NOTE — Assessment & Plan Note (Signed)
Her BP is well controlled Will monitor her lytes and renal function 

## 2014-12-17 LAB — CULTURE, URINE COMPREHENSIVE
Colony Count: NO GROWTH
Organism ID, Bacteria: NO GROWTH

## 2014-12-18 ENCOUNTER — Encounter: Payer: Self-pay | Admitting: Internal Medicine

## 2014-12-28 DIAGNOSIS — R928 Other abnormal and inconclusive findings on diagnostic imaging of breast: Secondary | ICD-10-CM | POA: Diagnosis not present

## 2014-12-28 DIAGNOSIS — Z9889 Other specified postprocedural states: Secondary | ICD-10-CM | POA: Diagnosis not present

## 2014-12-28 DIAGNOSIS — Z803 Family history of malignant neoplasm of breast: Secondary | ICD-10-CM | POA: Diagnosis not present

## 2014-12-30 ENCOUNTER — Encounter: Payer: Self-pay | Admitting: Internal Medicine

## 2014-12-30 ENCOUNTER — Ambulatory Visit (INDEPENDENT_AMBULATORY_CARE_PROVIDER_SITE_OTHER): Payer: Medicare Other | Admitting: Internal Medicine

## 2014-12-30 VITALS — BP 112/70 | HR 85 | Ht 63.5 in | Wt 162.8 lb

## 2014-12-30 DIAGNOSIS — G4719 Other hypersomnia: Secondary | ICD-10-CM | POA: Diagnosis not present

## 2014-12-30 NOTE — Progress Notes (Signed)
12/30/14- 42 yoF married former smoker referred courtesy of Dr Ronnald Ramp; daytime sleepiness-no sleep study Medical hx of lymphoma in remission, HBP, allergic rhinitis, glaucoma TSH nl For 6 or 7 years she has been having episodes of extreme daytime sleepiness especially and quiet activities like driving. She is primarily afraid of having a coronary accident. Episodes happened daily and last for hours. She avoids naps. Usual bedtime around 11 PM and awake around 6 AM, sleeping through the night, occasionally waking once. She does not use sleep medications. Iced tea in the daytime. She is not told that she snores or is restless or otherwise exhibiting unusual behavior during sleep. These episodes began several years before treatment for lymphoma and she did not have whole brain irradiation. No evident cataplexy, hallucination or seizure.  Prior to Admission medications   Medication Sig Start Date End Date Taking? Authorizing Provider  amLODipine (NORVASC) 10 MG tablet Take 0.5 tablets (5 mg total) by mouth daily. 09/29/14  Yes Janith Lima, MD  aspirin 81 MG tablet Take 81 mg by mouth daily.     Yes Historical Provider, MD  brimonidine-timolol (COMBIGAN) 0.2-0.5 % ophthalmic solution Place 1 drop into both eyes every 12 (twelve) hours.     Yes Historical Provider, MD  chlorpheniramine-HYDROcodone (TUSSIONEX PENNKINETIC ER) 10-8 MG/5ML SUER Take 5 mLs by mouth every 12 (twelve) hours as needed for cough. 12/15/14  Yes Janith Lima, MD  cholecalciferol (VITAMIN D) 1000 UNITS tablet Take 1,000 Units by mouth daily.     Yes Historical Provider, MD  cyclobenzaprine (FLEXERIL) 10 MG tablet TAKE 1 TABLET THREE TIMES DAILY AS NEEDED FOR MUSCLE SPASMS. 04/12/14  Yes Janith Lima, MD  docusate sodium (COLACE) 100 MG capsule Take once daily    Yes Historical Provider, MD  esomeprazole (NEXIUM) 40 MG capsule Take 1 capsule (40 mg total) by mouth daily. 30 minutes before the evening meal 11/06/13  Yes Noralee Space, MD   loratadine (CLARITIN) 10 MG tablet Take 10 mg by mouth daily. As needed    Yes Historical Provider, MD  Minoxidil 5 % FOAM Apply 3-5 mLs topically at bedtime. Apply to Scalp for hair loss at bedtime    Yes Historical Provider, MD  mirtazapine (REMERON) 15 MG tablet Take 7.5 mg by mouth as needed (as need for itchy scalp).    Yes Historical Provider, MD  Multiple Vitamins-Minerals (CENTRUM) tablet Take 1 tablet by mouth daily.     Yes Historical Provider, MD  pravastatin (PRAVACHOL) 40 MG tablet Take 1 tablet (40 mg total) by mouth daily. 08/30/14  Yes Janith Lima, MD  telmisartan-hydrochlorothiazide (MICARDIS HCT) 80-12.5 MG per tablet Take 1 tablet by mouth daily. 06/24/14  Yes Janith Lima, MD  tretinoin (RETIN-A) 0.05 % cream Once daily    Yes Historical Provider, MD   Past Medical History  Diagnosis Date  . Other malignant lymphomas, unspecified site, extranodal and solid organ sites   . Unspecified glaucoma   . Allergic rhinitis, cause unspecified   . Pulmonary nodule   . Unspecified essential hypertension   . Mitral valve disorders   . Pure hypercholesterolemia   . Esophageal reflux   . Diverticulosis of colon (without mention of hemorrhage)   . Colon polyps   . Hemorrhoids   . Fibrocystic breast disease   . Osteopenia   . Anxiety   . Alopecia   . Headache(784.0)   . Acid reflux   . High cholesterol    Past Surgical History  Procedure Laterality Date  . S/p tah/bso    . S/p orif prox phalanx  02/2007    right little finger----Dr Fredna Dow  . 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 Cloyd Stagers, [facility]  . Abdominal hysterectomy  1996  . Eye surgery  2009    Cataract   Family History  Problem Relation Age of Onset  . Hypertension Mother   . Cancer Father     throat  . Cancer Sister     breast (sister)   History   Social History  . Marital Status: Married    Spouse Name: N/A  . Number of Children: N/A  . Years of Education: N/A    Occupational History  . Not on file.   Social History Main Topics  . Smoking status: Former Smoker    Quit date: 08/27/1974  . Smokeless tobacco: Never Used  . Alcohol Use: No     Comment: occasionally  . Drug Use: No  . Sexual Activity: Yes   Other Topics Concern  . Not on file   Social History Narrative   ROS-see HPI   Negative unless "+" Constitutional:    weight loss, night sweats, fevers, chills, fatigue, lassitude. HEENT:    headaches, difficulty swallowing, tooth/dental problems, sore throat,       +sneezing, itching, ear ache, nasal congestion, post nasal drip, snoring CV:    chest pain, orthopnea, PND, swelling in lower extremities, anasarca,                                  dizziness, palpitations Resp:   shortness of breath with exertion or at rest.               + productive cough,   non-productive cough, coughing up of blood.              change in color of mucus.  wheezing.   Skin:    rash or lesions. GI:  +heartburn, indigestion, abdominal pain, nausea, vomiting, diarrhea,                 change in bowel habits, loss of appetite GU: dysuria, change in color of urine, no urgency or frequency.   flank pain. MS:   joint pain, stiffness, decreased range of motion, back pain. Neuro-     nothing unusual Psych:  change in mood or affect.  depression or anxiety.   memory loss.  OBJ- Physical Exam General- Alert, Oriented, Affect-appropriate, Distress- none acute Skin- rash-none, lesions- none, excoriation- none Lymphadenopathy- none Head- atraumatic            Eyes- Gross vision intact, PERRLA, conjunctivae and secretions clear            Ears- Hearing, canals-normal            Nose- Clear, no-Septal dev, mucus, polyps, erosion, perforation             Throat- Mallampati III , mucosa clear , drainage- none, tonsils- atrophic Neck- flexible , trachea midline, no stridor , thyroid nl, carotid no bruit Chest - symmetrical excursion , unlabored           Heart/CV-  RRR , no murmur , no gallop  , no rub, nl s1 s2                           -  JVD- none , edema- none, stasis changes- none, varices- none           Lung- clear to P&A, wheeze- none, cough- none , dullness-none, rub- none           Chest wall-  Abd-  Br/ Gen/ Rectal- Not done, not indicated Extrem- cyanosis- none, clubbing, none, atrophy- none, strength- nl Neuro- grossly intact to observation

## 2014-12-30 NOTE — Patient Instructions (Addendum)
Sample card given- Nuvigil 250 - try 1/2 or 1 tab in AM as needed for alertness.  We will very likely want to get a night time sleep study and a multiple sleep latency test done eventually, so that we can define better what to call your sleepiness pattern.  Try to nap and/ or extend night time sleep by a half hour or so per day

## 2014-12-30 NOTE — Assessment & Plan Note (Addendum)
She seems to be describing sleepiness associated with quiet monotonous activity such as long drives. There is no stereotypical movement, loss of consciousness or incontinence and no postictal pattern to make me think she is describing seizure activity. She doesn't describe cataplexy, sleep paralysis or hypnagogic hallucination that might suggest narcolepsy.  She may just not be getting enough sleep at night. We can use sample of Nuvigil to assess response to an alerting medication. For long-term therapy we would want more objective documentation with polysomnogram and multiple sleep latency test

## 2015-01-18 DIAGNOSIS — L821 Other seborrheic keratosis: Secondary | ICD-10-CM | POA: Diagnosis not present

## 2015-01-18 DIAGNOSIS — I868 Varicose veins of other specified sites: Secondary | ICD-10-CM | POA: Diagnosis not present

## 2015-01-18 DIAGNOSIS — L299 Pruritus, unspecified: Secondary | ICD-10-CM | POA: Diagnosis not present

## 2015-01-18 DIAGNOSIS — I781 Nevus, non-neoplastic: Secondary | ICD-10-CM | POA: Diagnosis not present

## 2015-01-21 ENCOUNTER — Other Ambulatory Visit: Payer: Medicare Other

## 2015-03-07 ENCOUNTER — Ambulatory Visit (INDEPENDENT_AMBULATORY_CARE_PROVIDER_SITE_OTHER): Payer: Medicare Other | Admitting: Emergency Medicine

## 2015-03-07 VITALS — BP 137/80 | HR 81 | Temp 97.8°F | Resp 18 | Ht 63.0 in | Wt 163.0 lb

## 2015-03-07 DIAGNOSIS — L237 Allergic contact dermatitis due to plants, except food: Secondary | ICD-10-CM

## 2015-03-07 MED ORDER — METHYLPREDNISOLONE ACETATE 80 MG/ML IJ SUSP
120.0000 mg | Freq: Once | INTRAMUSCULAR | Status: AC
Start: 2015-03-07 — End: 2015-03-07
  Administered 2015-03-07: 120 mg via INTRAMUSCULAR

## 2015-03-07 MED ORDER — TRIAMCINOLONE ACETONIDE 0.1 % EX CREA: 1.0000 "application " | TOPICAL_CREAM | Freq: Two times a day (BID) | CUTANEOUS | Status: DC

## 2015-03-07 NOTE — Patient Instructions (Signed)

## 2015-03-07 NOTE — Progress Notes (Signed)
Subjective:  Patient ID: Colleen White, female    DOB: [birth year]  Age: 69 y.o. MRN: [id number]  CC: Poison Ivy   HPI Colleen White presents  with poison ivy on her upper extremities. She has some on her abdomen. She was cleaning weeds in the church yard over the weekend and now has a rash. History of prior contact dermatitis. She has no improvement with over-the-counter medication she is very insistent that she receive a shot of sterile.  History Colleen White has a past medical history of Other malignant lymphomas, unspecified site, extranodal and solid organ sites; Unspecified glaucoma; Allergic rhinitis, cause unspecified; Pulmonary nodule; Unspecified essential hypertension; Mitral valve disorders; Pure hypercholesterolemia; Esophageal reflux; Diverticulosis of colon (without mention of hemorrhage); Colon polyps; Hemorrhoids; Fibrocystic breast disease; Osteopenia; Anxiety; Alopecia; Headache(784.0); Acid reflux; and High cholesterol.   She has past surgical history that includes s/p TAH/BSO; s/p ORIF prox phalanx (02/2007); s/p ELap----follicular lymphoma (09/7739); Abdominal hysterectomy (1996); and Eye surgery (2009).   Her  family history includes Cancer in her father and sister; Hypertension in her mother.  She   reports that she quit smoking about 40 years ago. She has never used smokeless tobacco. She reports that she does not drink alcohol or use illicit drugs.  Outpatient Prescriptions Prior to Visit  Medication Sig Dispense Refill  . amLODipine (NORVASC) 10 MG tablet Take 0.5 tablets (5 mg total) by mouth daily. 30 tablet 11  . aspirin 81 MG tablet Take 81 mg by mouth daily.      . brimonidine-timolol (COMBIGAN) 0.2-0.5 % ophthalmic solution Place 1 drop into both eyes every 12 (twelve) hours.      . cyclobenzaprine (FLEXERIL) 10 MG tablet TAKE 1 TABLET THREE TIMES DAILY AS NEEDED FOR MUSCLE SPASMS. 50 tablet 3  . docusate sodium (COLACE) 100 MG capsule Take once daily      . esomeprazole (NEXIUM) 40 MG capsule Take 1 capsule (40 mg total) by mouth daily. 30 minutes before the evening meal 30 capsule 6  . loratadine (CLARITIN) 10 MG tablet Take 10 mg by mouth daily. As needed     . Minoxidil 5 % FOAM Apply 3-5 mLs topically at bedtime. Apply to Scalp for hair loss at bedtime     . mirtazapine (REMERON) 15 MG tablet Take 7.5 mg by mouth as needed (as need for itchy scalp).     . Multiple Vitamins-Minerals (CENTRUM) tablet Take 1 tablet by mouth daily.      . pravastatin (PRAVACHOL) 40 MG tablet Take 1 tablet (40 mg total) by mouth daily. 30 tablet 5  . telmisartan-hydrochlorothiazide (MICARDIS HCT) 80-12.5 MG per tablet Take 1 tablet by mouth daily. 30 tablet 11  . tretinoin (RETIN-A) 0.05 % cream Once daily     . cholecalciferol (VITAMIN D) 1000 UNITS tablet Take 1,000 Units by mouth daily.      . chlorpheniramine-HYDROcodone (TUSSIONEX PENNKINETIC ER) 10-8 MG/5ML SUER Take 5 mLs by mouth every 12 (twelve) hours as needed for cough. 115 mL 0   No facility-administered medications prior to visit.    History   Social History  . Marital Status: Married    Spouse Name: N/A  . Number of Children: N/A  . Years of Education: N/A   Social History Main Topics  . Smoking status: Former Smoker    Quit date: 08/27/1974  . Smokeless tobacco: Never Used  . Alcohol Use: No     Comment: occasionally  . Drug Use: No  . Sexual Activity: Yes  Other Topics Concern  . None   Social History Narrative     Review of Systems  Constitutional: Negative for fever, chills and appetite change.  HENT: Negative for congestion, ear pain, postnasal drip, sinus pressure and sore throat.   Eyes: Negative for pain and redness.  Respiratory: Negative for cough, shortness of breath and wheezing.   Cardiovascular: Negative for leg swelling.  Gastrointestinal: Negative for nausea, vomiting, abdominal pain, diarrhea, constipation and blood in stool.  Endocrine: Negative for  polyuria.  Genitourinary: Negative for dysuria, urgency, frequency and flank pain.  Musculoskeletal: Negative for gait problem.  Skin: Negative for rash.  Neurological: Negative for weakness and headaches.  Psychiatric/Behavioral: Negative for confusion and decreased concentration. The patient is not nervous/anxious.     Objective:  BP 137/80 mmHg  Pulse 81  Temp(Src) 97.8 F (36.6 C) (Oral)  Resp 18  Ht 5\' 3"  (1.6 m)  Wt 163 lb (73.936 kg)  BMI 28.88 kg/m2  SpO2 98%  Physical Exam  Constitutional: She is oriented to person, place, and time. She appears well-developed and well-nourished.  HENT:  Head: Normocephalic and atraumatic.  Eyes: Conjunctivae are normal. Pupils are equal, round, and reactive to light.  Pulmonary/Chest: Effort normal.  Musculoskeletal: She exhibits no edema.  Neurological: She is alert and oriented to person, place, and time.  Skin: Skin is dry.  Psychiatric: She has a normal mood and affect. Her behavior is normal. Thought content normal.      Assessment & Plan:   Colleen White was seen today for poison ivy.  Diagnoses and all orders for this visit:  Poison ivy Orders: -     methylPREDNISolone acetate (DEPO-MEDROL) injection 120 mg; Inject 1.5 mLs (120 mg total) into the muscle once.  Other orders -     triamcinolone cream (KENALOG) 0.1 %; Apply 1 application topically 2 (two) times daily.   I have discontinued Colleen White's chlorpheniramine-HYDROcodone. I am also having her start on triamcinolone cream. Additionally, I am having her maintain her aspirin, CENTRUM, loratadine, docusate sodium, brimonidine-timolol, cholecalciferol, tretinoin, mirtazapine, Minoxidil, esomeprazole, cyclobenzaprine, telmisartan-hydrochlorothiazide, pravastatin, and amLODipine. We administered methylPREDNISolone acetate.  Meds ordered this encounter  Medications  . triamcinolone cream (KENALOG) 0.1 %    Sig: Apply 1 application topically 2 (two) times daily.     Dispense:  80 g    Refill:  0  . methylPREDNISolone acetate (DEPO-MEDROL) injection 120 mg    Sig:     Appropriate red flag conditions were discussed with the patient as well as actions that should be taken.  Patient expressed his understanding.  Follow-up: Return if symptoms worsen or fail to improve.  Roselee Culver, MD

## 2015-03-08 DIAGNOSIS — C8580 Other specified types of non-Hodgkin lymphoma, unspecified site: Secondary | ICD-10-CM | POA: Diagnosis not present

## 2015-03-08 DIAGNOSIS — J341 Cyst and mucocele of nose and nasal sinus: Secondary | ICD-10-CM | POA: Diagnosis not present

## 2015-03-08 DIAGNOSIS — J342 Deviated nasal septum: Secondary | ICD-10-CM | POA: Diagnosis not present

## 2015-03-08 DIAGNOSIS — M8588 Other specified disorders of bone density and structure, other site: Secondary | ICD-10-CM | POA: Diagnosis not present

## 2015-03-08 DIAGNOSIS — R51 Headache: Secondary | ICD-10-CM | POA: Diagnosis not present

## 2015-03-08 DIAGNOSIS — K5730 Diverticulosis of large intestine without perforation or abscess without bleeding: Secondary | ICD-10-CM | POA: Diagnosis not present

## 2015-03-08 DIAGNOSIS — Z90710 Acquired absence of both cervix and uterus: Secondary | ICD-10-CM | POA: Diagnosis not present

## 2015-03-08 DIAGNOSIS — M47819 Spondylosis without myelopathy or radiculopathy, site unspecified: Secondary | ICD-10-CM | POA: Diagnosis not present

## 2015-03-09 ENCOUNTER — Telehealth: Payer: Self-pay | Admitting: Geriatric Medicine

## 2015-03-09 ENCOUNTER — Other Ambulatory Visit: Payer: Self-pay | Admitting: Internal Medicine

## 2015-03-09 DIAGNOSIS — L259 Unspecified contact dermatitis, unspecified cause: Secondary | ICD-10-CM

## 2015-03-09 MED ORDER — CLOBETASOL PROPIONATE 0.05 % EX OINT: 1.0000 "application " | TOPICAL_OINTMENT | Freq: Two times a day (BID) | CUTANEOUS | Status: DC

## 2015-03-09 MED ORDER — METHYLPREDNISOLONE 4 MG PO TBPK
ORAL_TABLET | ORAL | Status: DC
Start: 2015-03-09 — End: 2015-06-21

## 2015-03-09 NOTE — Telephone Encounter (Signed)
Patient came in to the office today without an appt, wanting to be seen. She has a skin rash that is itching and spreading. Its on her arms, legs, abdomen, and right ear. She was seen in urgent care Monday night and was given Depo medrol inj 120 mLs and kenalog cream. She was also told to take benedryl. She says nothing is working and it is getting worse. She thinks it could be poison ivy, or oak. She wants to know if you can call in something else for her to use. Please advise, thanks.

## 2015-03-09 NOTE — Telephone Encounter (Signed)
Reviewed pictures taken by the CMA and it appears that she does have contact dermatitis. I think she needs a more potent topical steroid and prescribed clobetasol ointment. Will also re-dose her with systemic steroids with a Medrol Dosepak

## 2015-03-15 DIAGNOSIS — C8580 Other specified types of non-Hodgkin lymphoma, unspecified site: Secondary | ICD-10-CM | POA: Diagnosis not present

## 2015-03-15 DIAGNOSIS — C8599 Non-Hodgkin lymphoma, unspecified, extranodal and solid organ sites: Secondary | ICD-10-CM | POA: Diagnosis not present

## 2015-03-15 DIAGNOSIS — Z6827 Body mass index (BMI) 27.0-27.9, adult: Secondary | ICD-10-CM | POA: Diagnosis not present

## 2015-03-22 ENCOUNTER — Ambulatory Visit: Payer: Medicare Other | Admitting: Internal Medicine

## 2015-03-23 ENCOUNTER — Other Ambulatory Visit: Payer: Self-pay | Admitting: Internal Medicine

## 2015-04-13 DIAGNOSIS — S80861A Insect bite (nonvenomous), right lower leg, initial encounter: Secondary | ICD-10-CM | POA: Diagnosis not present

## 2015-04-13 DIAGNOSIS — L658 Other specified nonscarring hair loss: Secondary | ICD-10-CM | POA: Diagnosis not present

## 2015-04-13 DIAGNOSIS — W57XXXA Bitten or stung by nonvenomous insect and other nonvenomous arthropods, initial encounter: Secondary | ICD-10-CM | POA: Diagnosis not present

## 2015-04-13 DIAGNOSIS — I83811 Varicose veins of right lower extremities with pain: Secondary | ICD-10-CM | POA: Diagnosis not present

## 2015-04-13 DIAGNOSIS — I83813 Varicose veins of bilateral lower extremities with pain: Secondary | ICD-10-CM | POA: Insufficient documentation

## 2015-04-13 DIAGNOSIS — L821 Other seborrheic keratosis: Secondary | ICD-10-CM | POA: Diagnosis not present

## 2015-05-03 DIAGNOSIS — J342 Deviated nasal septum: Secondary | ICD-10-CM | POA: Diagnosis not present

## 2015-05-03 DIAGNOSIS — C8290 Follicular lymphoma, unspecified, unspecified site: Secondary | ICD-10-CM | POA: Diagnosis not present

## 2015-05-03 DIAGNOSIS — G4452 New daily persistent headache (NDPH): Secondary | ICD-10-CM | POA: Diagnosis not present

## 2015-06-16 DIAGNOSIS — H40013 Open angle with borderline findings, low risk, bilateral: Secondary | ICD-10-CM | POA: Diagnosis not present

## 2015-06-16 DIAGNOSIS — H40053 Ocular hypertension, bilateral: Secondary | ICD-10-CM | POA: Diagnosis not present

## 2015-06-16 DIAGNOSIS — Z961 Presence of intraocular lens: Secondary | ICD-10-CM | POA: Diagnosis not present

## 2015-06-16 DIAGNOSIS — H04123 Dry eye syndrome of bilateral lacrimal glands: Secondary | ICD-10-CM | POA: Diagnosis not present

## 2015-06-20 DIAGNOSIS — Z6827 Body mass index (BMI) 27.0-27.9, adult: Secondary | ICD-10-CM | POA: Diagnosis not present

## 2015-06-20 DIAGNOSIS — R197 Diarrhea, unspecified: Secondary | ICD-10-CM | POA: Diagnosis not present

## 2015-06-21 ENCOUNTER — Telehealth: Payer: Self-pay | Admitting: Internal Medicine

## 2015-06-21 ENCOUNTER — Encounter: Payer: Self-pay | Admitting: Internal Medicine

## 2015-06-21 ENCOUNTER — Other Ambulatory Visit: Payer: Medicare Other

## 2015-06-21 ENCOUNTER — Ambulatory Visit (INDEPENDENT_AMBULATORY_CARE_PROVIDER_SITE_OTHER): Payer: Medicare Other | Admitting: Internal Medicine

## 2015-06-21 ENCOUNTER — Other Ambulatory Visit: Payer: Self-pay | Admitting: Internal Medicine

## 2015-06-21 VITALS — BP 160/82 | HR 68 | Temp 97.8°F | Resp 16 | Wt 162.0 lb

## 2015-06-21 DIAGNOSIS — R197 Diarrhea, unspecified: Secondary | ICD-10-CM

## 2015-06-21 DIAGNOSIS — F411 Generalized anxiety disorder: Secondary | ICD-10-CM

## 2015-06-21 MED ORDER — ALPRAZOLAM 0.5 MG PO TABS: 0.5000 mg | ORAL_TABLET | Freq: Two times a day (BID) | ORAL | Status: DC | PRN

## 2015-06-21 NOTE — Patient Instructions (Addendum)
Stool studies were ordered.  We will call you as soon as the results come back.  We will try alprazolam for the increased stress/axniety.  Remember it can cause some mild drowsiness/dizziness.  If you have any questions or concerns, please call.

## 2015-06-21 NOTE — Progress Notes (Signed)
Subjective:    Patient ID: Colleen White, female    DOB: [birth year], 69 y.o.   MRN: [id number]  HPI She is here for an acute visit for abdominal pain/diarrhea and anxiety.  She has been having diarrhea, excessive intestinal gas, bloating and abdominal cramping.  Her symptoms started 4-6 weeks ago. She has had diverticulitis in the past and thought that was what it was.  She saw her GI doctor at [facility] and he told her it is not diverticulitis.  He wanted a stool sample, but she was not able to give a sample when she saw him.  He wanted to rule out a bacterial infection.  She was hoping we could order a stool sample.  She needs to help take care of her brother in law who is undergoing treatment for cancer.  She has taken imodium as needed - this does help.  She continues to have gas, bloating and discomfort in abdomen.  She had some recent abtibiotic a few days before this started - took three tabs of the azithromycin.  She denies med changes that correlate with her symptoms  Anxiety:  In 12/01/2022 her mom passed away, her sister passed away in March 02, 2023 and now her brother in law is sick.  She has been experiencing increased stress/anxiety, but denies depression.  Her sister takes xanax 0.5mg  and she recommended it to her.   She is wondering is she can try it.  She only wants to take it as needed.   Medications and allergies reviewed with patient and updated if appropriate.  Patient Active Problem List   Diagnosis Date Noted  . Dysuria 12/15/2014  . Cough 12/15/2014  . Excessive daytime sleepiness 04/12/2014  . Routine general medical examination at a health care facility 11/30/2013  . LYMPHOMA 10/20/2010  . GLAUCOMA 08/04/2008  . ALLERGIC RHINITIS 12/17/2007  . OSTEOPENIA 12/17/2007  . HYPERCHOLESTEROLEMIA 07/31/2007  . Essential hypertension 07/31/2007  . MITRAL VALVE PROLAPSE 07/31/2007  . GERD 07/31/2007    Past Medical History  Diagnosis Date  . Other malignant lymphomas,  unspecified site, extranodal and solid organ sites   . Unspecified glaucoma   . Allergic rhinitis, cause unspecified   . Pulmonary nodule   . Unspecified essential hypertension   . Mitral valve disorders   . Pure hypercholesterolemia   . Esophageal reflux   . Diverticulosis of colon (without mention of hemorrhage)   . Colon polyps   . Hemorrhoids   . Fibrocystic breast disease   . Osteopenia   . Anxiety   . Alopecia   . Headache(784.0)   . Acid reflux   . High cholesterol     Past Surgical History  Procedure Laterality Date  . S/p tah/bso    . S/p orif prox phalanx  03/02/07    right little finger----Dr Fredna Dow  . 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 Cloyd Stagers, [facility]  . Abdominal hysterectomy  1996  . Eye surgery  12/01/2007    Cataract    Social History   Social History  . Marital Status: Married    Spouse Name: N/A  . Number of Children: N/A  . Years of Education: N/A   Social History Main Topics  . Smoking status: Former Smoker    Quit date: 08/27/1974  . Smokeless tobacco: Never Used  . Alcohol Use: No     Comment: occasionally  . Drug Use: No  . Sexual Activity: Yes   Other Topics Concern  .  None   Social History Narrative    Review of Systems  Constitutional: Negative for fever and chills.  Gastrointestinal: Positive for abdominal pain and diarrhea. Negative for nausea and blood in stool.       No GERD  Psychiatric/Behavioral: Negative for dysphoric mood. The patient is nervous/anxious.        Objective:   Filed Vitals:   06/21/15 0827  BP: 160/82  Pulse: 68  Temp: 97.8 F (36.6 C)  Resp: 16   Filed Weights   06/21/15 0827  Weight: 162 lb (73.483 kg)   Body mass index is 28.7 kg/(m^2).   Physical Exam  Constitutional: She appears well-developed and well-nourished. No distress.  Abdominal: Soft. She exhibits no distension and no mass. There is tenderness (mild, LLQ). There is no rebound and no guarding.    Psychiatric: She has a normal mood and affect. Her behavior is normal.        Assessment & Plan:   Diarrhea, abdominal cramping, increased gas,bloating 4-6 weeks of symptoms Saw GI and he did think she had diverticulitis Recommended stool studies, which she was not able to do there - would like [place] to order to rule out infectious cause She understands stress may be the cause Stool studies ordered  Anxiety, stress Related to loss of mother, sister and now brother in law is very sick Would like xanax as needed - has never taken it I think this is reasonable Discussed that med is possibly addicting and may cause dizziness, drowsiness and can not be combined with alcohol Prescription given for a small amount Call with questions or concerns

## 2015-06-21 NOTE — Progress Notes (Signed)
Pre visit review using our clinic review tool, if applicable. No additional management support is needed unless otherwise documented below in the visit note. 

## 2015-06-21 NOTE — Telephone Encounter (Signed)
Pt called back this afternoon and wants her results from today's visit to be relayed to via her cell at [phone].

## 2015-06-22 DIAGNOSIS — Z1239 Encounter for other screening for malignant neoplasm of breast: Secondary | ICD-10-CM | POA: Diagnosis not present

## 2015-06-22 DIAGNOSIS — Z803 Family history of malignant neoplasm of breast: Secondary | ICD-10-CM | POA: Diagnosis not present

## 2015-06-22 DIAGNOSIS — Z87898 Personal history of other specified conditions: Secondary | ICD-10-CM | POA: Diagnosis not present

## 2015-06-22 DIAGNOSIS — N63 Unspecified lump in breast: Secondary | ICD-10-CM | POA: Diagnosis not present

## 2015-06-22 DIAGNOSIS — Z09 Encounter for follow-up examination after completed treatment for conditions other than malignant neoplasm: Secondary | ICD-10-CM | POA: Diagnosis not present

## 2015-06-22 LAB — FECAL LACTOFERRIN, QUANT: Lactoferrin: NEGATIVE

## 2015-06-22 LAB — GIARDIA/CRYPTOSPORIDIUM (EIA)
Cryptosporidium Screen (EIA): NEGATIVE
Giardia Screen (EIA): NEGATIVE

## 2015-06-22 LAB — C. DIFFICILE GDH AND TOXIN A/B
C. difficile GDH: NOT DETECTED
C. difficile Toxin A/B: NOT DETECTED

## 2015-06-22 NOTE — Telephone Encounter (Signed)
Let her know her cdiff is neg.  Other studies are pending.

## 2015-06-22 NOTE — Telephone Encounter (Signed)
Spoke with pt to inform.  

## 2015-06-23 DIAGNOSIS — Z9189 Other specified personal risk factors, not elsewhere classified: Secondary | ICD-10-CM | POA: Diagnosis not present

## 2015-06-23 DIAGNOSIS — N6011 Diffuse cystic mastopathy of right breast: Secondary | ICD-10-CM | POA: Diagnosis not present

## 2015-06-23 DIAGNOSIS — R928 Other abnormal and inconclusive findings on diagnostic imaging of breast: Secondary | ICD-10-CM | POA: Diagnosis not present

## 2015-06-23 DIAGNOSIS — N6012 Diffuse cystic mastopathy of left breast: Secondary | ICD-10-CM | POA: Diagnosis not present

## 2015-06-23 DIAGNOSIS — Z6828 Body mass index (BMI) 28.0-28.9, adult: Secondary | ICD-10-CM | POA: Diagnosis not present

## 2015-06-24 DIAGNOSIS — N6019 Diffuse cystic mastopathy of unspecified breast: Secondary | ICD-10-CM | POA: Insufficient documentation

## 2015-06-24 DIAGNOSIS — Z9189 Other specified personal risk factors, not elsewhere classified: Secondary | ICD-10-CM | POA: Insufficient documentation

## 2015-06-24 DIAGNOSIS — N6011 Diffuse cystic mastopathy of right breast: Secondary | ICD-10-CM | POA: Insufficient documentation

## 2015-06-25 LAB — STOOL CULTURE

## 2015-07-06 ENCOUNTER — Ambulatory Visit: Payer: Medicare Other | Admitting: Internal Medicine

## 2015-07-25 ENCOUNTER — Other Ambulatory Visit: Payer: Self-pay | Admitting: Internal Medicine

## 2015-07-28 ENCOUNTER — Encounter: Payer: Self-pay | Admitting: Internal Medicine

## 2015-07-28 ENCOUNTER — Ambulatory Visit (INDEPENDENT_AMBULATORY_CARE_PROVIDER_SITE_OTHER): Payer: Medicare Other | Admitting: Internal Medicine

## 2015-07-28 ENCOUNTER — Other Ambulatory Visit (INDEPENDENT_AMBULATORY_CARE_PROVIDER_SITE_OTHER): Payer: Medicare Other

## 2015-07-28 VITALS — BP 138/78 | HR 90 | Temp 98.1°F | Resp 16 | Ht 63.0 in | Wt 165.0 lb

## 2015-07-28 DIAGNOSIS — I10 Essential (primary) hypertension: Secondary | ICD-10-CM

## 2015-07-28 LAB — BASIC METABOLIC PANEL
BUN: 10 mg/dL (ref 6–23)
CO2: 35 mEq/L — ABNORMAL HIGH (ref 19–32)
Calcium: 9.2 mg/dL (ref 8.4–10.5)
Chloride: 100 mEq/L (ref 96–112)
Creatinine, Ser: 0.8 mg/dL (ref 0.40–1.20)
GFR: 75.2 mL/min (ref >60.00)
Glucose, Bld: 94 mg/dL (ref 70–99)
Potassium: 3.5 mEq/L (ref 3.5–5.1)
Sodium: 140 mEq/L (ref 135–145)

## 2015-07-28 MED ORDER — TELMISARTAN-HCTZ 80-12.5 MG PO TABS: 1.0000 | ORAL_TABLET | Freq: Every day | ORAL | Status: DC

## 2015-07-28 NOTE — Progress Notes (Signed)
Pre visit review using our clinic review tool, if applicable. No additional management support is needed unless otherwise documented below in the visit note. 

## 2015-07-28 NOTE — Progress Notes (Signed)
Subjective:  Patient ID: Colleen White, female    DOB: 04/29/1946  Age: 69 y.o. MRN: [id number]  CC: Hypertension   HPI Colleen White presents for a BP check - she feels well and offers no complaints.  Outpatient Prescriptions Prior to Visit  Medication Sig Dispense Refill  . ALPRAZolam (XANAX) 0.5 MG tablet Take 1 tablet (0.5 mg total) by mouth 2 (two) times daily as needed for anxiety. 30 tablet 0  . amLODipine (NORVASC) 10 MG tablet Take 0.5 tablets (5 mg total) by mouth daily. 30 tablet 11  . aspirin 81 MG tablet Take 81 mg by mouth daily.      . brimonidine-timolol (COMBIGAN) 0.2-0.5 % ophthalmic solution Place 1 drop into both eyes every 12 (twelve) hours.      . cholecalciferol (VITAMIN D) 1000 UNITS tablet Take 1,000 Units by mouth daily.      . cyclobenzaprine (FLEXERIL) 10 MG tablet TAKE 1 TABLET THREE TIMES DAILY AS NEEDED FOR MUSCLE SPASMS. 50 tablet 3  . docusate sodium (COLACE) 100 MG capsule Take once daily     . esomeprazole (NEXIUM) 40 MG capsule Take 1 capsule (40 mg total) by mouth daily. 30 minutes before the evening meal 30 capsule 6  . loratadine (CLARITIN) 10 MG tablet Take 10 mg by mouth daily. As needed     . Minoxidil 5 % FOAM Apply 3-5 mLs topically at bedtime. Apply to Scalp for hair loss at bedtime     . mirtazapine (REMERON) 15 MG tablet Take 7.5 mg by mouth as needed (as need for itchy scalp).     . Multiple Vitamins-Minerals (CENTRUM) tablet Take 1 tablet by mouth daily.      . pravastatin (PRAVACHOL) 40 MG tablet TAKE 1 TABLET ONCE DAILY. 30 tablet 5  . tretinoin (RETIN-A) 0.05 % cream Once daily     . telmisartan-hydrochlorothiazide (MICARDIS HCT) 80-12.5 MG per tablet Take 1 tablet by mouth daily. 30 tablet 11   No facility-administered medications prior to visit.    ROS Review of Systems  Constitutional: Negative.  Negative for fever, chills, diaphoresis, appetite change and fatigue.  HENT: Negative.   Eyes: Negative.   Respiratory:  Negative.  Negative for cough, choking, chest tightness, shortness of breath and stridor.   Cardiovascular: Negative.  Negative for chest pain, palpitations and leg swelling.  Gastrointestinal: Negative.  Negative for nausea, vomiting, abdominal pain, diarrhea and constipation.  Endocrine: Negative.   Genitourinary: Negative.  Negative for difficulty urinating.  Musculoskeletal: Negative.  Negative for myalgias, back pain, joint swelling and arthralgias.  Skin: Negative.  Negative for rash.  Allergic/Immunologic: Negative.   Neurological: Negative.  Negative for dizziness, tremors, seizures, syncope, light-headedness, numbness and headaches.  Hematological: Negative.  Negative for adenopathy. Does not bruise/bleed easily.  Psychiatric/Behavioral: Negative.     Objective:  BP 138/78 mmHg  Pulse 90  Temp(Src) 98.1 F (36.7 C) (Oral)  Resp 16  Ht 5\' 3"  (1.6 m)  Wt 165 lb (74.844 kg)  BMI 29.24 kg/m2  SpO2 96%  BP Readings from Last 3 Encounters:  07/28/15 138/78  06/21/15 160/82  03/07/15 137/80    Wt Readings from Last 3 Encounters:  07/28/15 165 lb (74.844 kg)  06/21/15 162 lb (73.483 kg)  03/07/15 163 lb (73.936 kg)    Physical Exam  Constitutional: She is oriented to person, place, and time. No distress.  HENT:  Mouth/Throat: Oropharynx is clear and moist. No oropharyngeal exudate.  Eyes: Conjunctivae are normal. Right eye exhibits no  discharge. Left eye exhibits no discharge. No scleral icterus.  Neck: Normal range of motion. Neck supple. No JVD present. No tracheal deviation present. No thyromegaly present.  Cardiovascular: Normal rate, regular rhythm, normal heart sounds and intact distal pulses.  Exam reveals no gallop and no friction rub.   No murmur heard. Pulmonary/Chest: Effort normal and breath sounds normal. No stridor. No respiratory distress. She has no wheezes. She has no rales. She exhibits no tenderness.  Abdominal: Soft. Bowel sounds are normal. She  exhibits no distension and no mass. There is no tenderness. There is no rebound and no guarding.  Musculoskeletal: Normal range of motion. She exhibits no edema or tenderness.  Lymphadenopathy:    She has no cervical adenopathy.  Neurological: She is oriented to person, place, and time.  Skin: Skin is warm and dry. No rash noted. She is not diaphoretic. No erythema. No pallor.  Vitals reviewed.   Lab Results  Component Value Date   WBC 4.1 12/15/2014   HGB 13.1 12/15/2014   HCT 37.8 12/15/2014   PLT 214.0 12/15/2014   GLUCOSE 94 07/28/2015   CHOL 153 12/15/2014   TRIG 114.0 12/15/2014   HDL 41.30 12/15/2014   LDLDIRECT 142.5 09/30/2012   LDLCALC 89 12/15/2014   ALT 20 12/15/2014   AST 21 12/15/2014   NA 140 07/28/2015   K 3.5 07/28/2015   CL 100 07/28/2015   CREATININE 0.80 07/28/2015   BUN 10 07/28/2015   CO2 35* 07/28/2015   TSH 1.26 12/15/2014   HGBA1C 5.7 08/09/2009    Dg Chest 2 View  12/15/2014  CLINICAL DATA:  Cough for 4 weeks, history of bronchitis EXAM: CHEST  2 VIEW COMPARISON:  12/25/2013 FINDINGS: Cardiomediastinal silhouette is stable. No acute infiltrate or pleural effusion. No pulmonary edema. Mild degenerative changes mid and lower thoracic spine. IMPRESSION: No active disease.  Mild degenerative changes thoracic spine. Electronically Signed   By: Lahoma Crocker M.D.   On: 12/15/2014 10:50    Assessment & Plan:   Colleen White was seen today for hypertension.  Diagnoses and all orders for this visit:  Essential hypertension- her BP is well controlled, lytes and renal function are stable -     telmisartan-hydrochlorothiazide (MICARDIS HCT) 80-12.5 MG tablet; Take 1 tablet by mouth daily. -     Basic metabolic panel; Future   I have changed Colleen White's telmisartan-hydrochlorothiazide. I am also having her maintain her aspirin, CENTRUM, loratadine, docusate sodium, brimonidine-timolol, cholecalciferol, tretinoin, mirtazapine, Minoxidil, esomeprazole,  cyclobenzaprine, amLODipine, pravastatin, and ALPRAZolam.  Meds ordered this encounter  Medications  . telmisartan-hydrochlorothiazide (MICARDIS HCT) 80-12.5 MG tablet    Sig: Take 1 tablet by mouth daily.    Dispense:  30 tablet    Refill:  11     Follow-up: Return in about 6 months (around 01/26/2016).  Scarlette Calico, MD

## 2015-07-28 NOTE — Patient Instructions (Signed)
Hypertension Hypertension, commonly called high blood pressure, is when the force of blood pumping through your arteries is too strong. Your arteries are the blood vessels that carry blood from your heart throughout your body. A blood pressure reading consists of a higher number over a lower number, such as 110/72. The higher number (systolic) is the pressure inside your arteries when your heart pumps. The lower number (diastolic) is the pressure inside your arteries when your heart relaxes. Ideally you want your blood pressure below 120/80. Hypertension forces your heart to work harder to pump blood. Your arteries may become narrow or stiff. Having untreated or uncontrolled hypertension can cause heart attack, stroke, kidney disease, and other problems. RISK FACTORS Some risk factors for high blood pressure are controllable. Others are not.  Risk factors you cannot control include:   Race. You may be at higher risk if you are African American.  Age. Risk increases with age.  Gender. Men are at higher risk than women before age 45 years. After age 65, women are at higher risk than men. Risk factors you can control include:  Not getting enough exercise or physical activity.  Being overweight.  Getting too much fat, sugar, calories, or salt in your diet.  Drinking too much alcohol. SIGNS AND SYMPTOMS Hypertension does not usually cause signs or symptoms. Extremely high blood pressure (hypertensive crisis) may cause headache, anxiety, shortness of breath, and nosebleed. DIAGNOSIS To check if you have hypertension, your health care provider will measure your blood pressure while you are seated, with your arm held at the level of your heart. It should be measured at least twice using the same arm. Certain conditions can cause a difference in blood pressure between your right and left arms. A blood pressure reading that is higher than normal on one occasion does not mean that you need treatment. If  it is not clear whether you have high blood pressure, you may be asked to return on a different day to have your blood pressure checked again. Or, you may be asked to monitor your blood pressure at home for 1 or more weeks. TREATMENT Treating high blood pressure includes making lifestyle changes and possibly taking medicine. Living a healthy lifestyle can help lower high blood pressure. You may need to change some of your habits. Lifestyle changes may include:  Following the DASH diet. This diet is high in fruits, vegetables, and whole grains. It is low in salt, red meat, and added sugars.  Keep your sodium intake below 2,300 mg per day.  Getting at least 30-45 minutes of aerobic exercise at least 4 times per week.  Losing weight if necessary.  Not smoking.  Limiting alcoholic beverages.  Learning ways to reduce stress. Your health care provider may prescribe medicine if lifestyle changes are not enough to get your blood pressure under control, and if one of the following is true:  You are 18-59 years of age and your systolic blood pressure is above 140.  You are 60 years of age or older, and your systolic blood pressure is above 150.  Your diastolic blood pressure is above 90.  You have diabetes, and your systolic blood pressure is over 140 or your diastolic blood pressure is over 90.  You have kidney disease and your blood pressure is above 140/90.  You have heart disease and your blood pressure is above 140/90. Your personal target blood pressure may vary depending on your medical conditions, your age, and other factors. HOME CARE INSTRUCTIONS    Have your blood pressure rechecked as directed by your health care provider.   Take medicines only as directed by your health care provider. Follow the directions carefully. Blood pressure medicines must be taken as prescribed. The medicine does not work as well when you skip doses. Skipping doses also puts you at risk for  problems.  Do not smoke.   Monitor your blood pressure at home as directed by your health care provider. SEEK MEDICAL CARE IF:   You think you are having a reaction to medicines taken.  You have recurrent headaches or feel dizzy.  You have swelling in your ankles.  You have trouble with your vision. SEEK IMMEDIATE MEDICAL CARE IF:  You develop a severe headache or confusion.  You have unusual weakness, numbness, or feel faint.  You have severe chest or abdominal pain.  You vomit repeatedly.  You have trouble breathing. MAKE SURE YOU:   Understand these instructions.  Will watch your condition.  Will get help right away if you are not doing well or get worse.   This information is not intended to replace advice given to you by your health care provider. Make sure you discuss any questions you have with your health care provider.   Document Released: 08/13/2005 Document Revised: 12/28/2014 Document Reviewed: 06/05/2013 Elsevier Interactive Patient Education 2016 [employer] Inc[employer]  

## 2015-08-08 ENCOUNTER — Ambulatory Visit: Payer: Medicare Other | Admitting: Internal Medicine

## 2015-08-28 HISTORY — PX: BREAST CYST ASPIRATION: SHX578

## 2015-09-19 DIAGNOSIS — R1032 Left lower quadrant pain: Secondary | ICD-10-CM | POA: Diagnosis not present

## 2015-09-19 DIAGNOSIS — Z124 Encounter for screening for malignant neoplasm of cervix: Secondary | ICD-10-CM | POA: Diagnosis not present

## 2015-09-20 ENCOUNTER — Other Ambulatory Visit: Payer: Self-pay | Admitting: Obstetrics & Gynecology

## 2015-09-20 DIAGNOSIS — E2839 Other primary ovarian failure: Secondary | ICD-10-CM

## 2015-09-26 ENCOUNTER — Telehealth: Payer: Self-pay | Admitting: Cardiology

## 2015-09-26 DIAGNOSIS — R000 Tachycardia, unspecified: Secondary | ICD-10-CM

## 2015-09-26 NOTE — Telephone Encounter (Signed)
Would need a monitor to figure out if this is anything significant like atrial fibrillation.  Would suggest 30 day event monitor to look for afib.  Needs followup after that.

## 2015-09-26 NOTE — Telephone Encounter (Signed)
Colleen White is calling because she has been having some rapid heart beat often , it happen once 6wks ago and one the other day .

## 2015-09-26 NOTE — Telephone Encounter (Signed)
Pt advised, verbalized understanding, agreed with plan, pt aware appt with Dr Aundra Dubin 11/08/15 1:30PM.

## 2015-09-26 NOTE — Telephone Encounter (Signed)
LMTCB

## 2015-09-26 NOTE — Telephone Encounter (Signed)
Pt states in the last 6 weeks she has had 2 episodes of fast heart beat, lasting about 10 minutes, relieved with stopping activity. Pt denies other symptoms associated with rapid heart beat and does not know her heart rate or BP. Pt advised I will forward to Dr Aundra Dubin for review.

## 2015-09-26 NOTE — Telephone Encounter (Signed)
Follow up   Pt is calling returning rn call

## 2015-09-29 ENCOUNTER — Telehealth: Payer: Self-pay | Admitting: *Deleted

## 2015-09-29 ENCOUNTER — Telehealth: Payer: Self-pay | Admitting: Cardiology

## 2015-09-29 ENCOUNTER — Ambulatory Visit
Admission: RE | Admit: 2015-09-29 | Discharge: 2015-09-29 | Disposition: A | Discharge status: Home / Self Care | Payer: Medicare Other | Source: Ambulatory Visit | Attending: Obstetrics & Gynecology | Admitting: Obstetrics & Gynecology

## 2015-09-29 ENCOUNTER — Encounter: Payer: Self-pay | Admitting: *Deleted

## 2015-09-29 ENCOUNTER — Ambulatory Visit (INDEPENDENT_AMBULATORY_CARE_PROVIDER_SITE_OTHER): Payer: Medicare Other

## 2015-09-29 DIAGNOSIS — M85852 Other specified disorders of bone density and structure, left thigh: Secondary | ICD-10-CM | POA: Diagnosis not present

## 2015-09-29 DIAGNOSIS — R000 Tachycardia, unspecified: Secondary | ICD-10-CM

## 2015-09-29 DIAGNOSIS — E2839 Other primary ovarian failure: Secondary | ICD-10-CM

## 2015-09-29 DIAGNOSIS — I4891 Unspecified atrial fibrillation: Secondary | ICD-10-CM

## 2015-09-29 MED ORDER — RIVAROXABAN 20 MG PO TABS: 20.0000 mg | ORAL_TABLET | Freq: Every day | ORAL | Status: DC

## 2015-09-29 MED ORDER — METOPROLOL SUCCINATE ER 25 MG PO TB24: 25.0000 mg | ORAL_TABLET | Freq: Every day | ORAL | Status: DC

## 2015-09-29 NOTE — Telephone Encounter (Signed)
I will forward to [facility], per Katie no events in system.

## 2015-09-29 NOTE — Telephone Encounter (Signed)
Please shred all reports, they will be going to the correct patient.

## 2015-09-29 NOTE — Telephone Encounter (Signed)
[employer] calling in to inform Dr Colleen White that the pt had a noted critical event of rapid afib at a rate of 190 bpm at 2:39pm CT, on her cardiac event monitor.  [employer] will be faxing this report over now, with a follow-up strip, for Dr Colleen White to review.  Notified the pt and she states she did press the button at that time, for she was feeling a "fluttering sensation after drinking 2 cups of coffee."  Pt states that she feels like the 2 cups of coffee triggered this event. Pt denies any cp, sob, DOE, dizziness, pre-syncopal, or syncopal episodes at the time of the event and at this current time.  Pt reports her BP at that time was stable at 128/72.  Pt states she is in no acute distress and has no cardiac complaints at this time.  Informed the pt that I will show her cardiac event report to Dr Colleen White for further review and recommendation, and either myself or his nurse Webb Silversmith will follow-up with her thereafter.  Pt verbalized understanding and agrees with this plan.

## 2015-09-29 NOTE — Telephone Encounter (Signed)
Bailey Mech is the lab supervisor and she would like to speak with the nurse that placed the monitor for the pt. There were some abnormal readings she would like to discuss. Please f/u as soon as possible

## 2015-09-29 NOTE — Progress Notes (Signed)
Patient ID: Colleen White, female   DOB: [birth year], 70 y.o.   MRN: [id number] Patient was contacted by [employer] shortly after having her monitor applied on 09/29/2015.  They were inquiring regarding her well being because they had on record she initiated a recording on 09/28/2015 for syncope.  The patient did not have a monitor on 09/28/2015.  The serial number assigned to Colleen White had been for a monitor placed on another patient on 09/28/2015 because that patients initial monitor was not functioning.  [employer] had been called at that time but did not update the records, and the serial number was inadvertently reassigned to Colleen White at enrollment.  Colleen White has on a [employer] verite 30 day cardiac event monitor.  Her serial number in [id number].  [employer] has been called again and intake is to correct their records.

## 2015-09-29 NOTE — Telephone Encounter (Signed)
Per Dr Ventura Bruns Xarelto 39m daily, Toprol XL 573mdaily, stop aspirin.   Pt advised, verbalized understanding, arrange new pt appt in [facility] in 2 weeks, keep appt already scheduled with Dr McAundra Dubin/14/17.

## 2015-10-11 ENCOUNTER — Ambulatory Visit (INDEPENDENT_AMBULATORY_CARE_PROVIDER_SITE_OTHER): Payer: Medicare Other

## 2015-10-11 DIAGNOSIS — Z23 Encounter for immunization: Secondary | ICD-10-CM

## 2015-10-13 ENCOUNTER — Ambulatory Visit: Payer: Medicare Other | Admitting: Pharmacist

## 2015-10-14 DIAGNOSIS — H26492 Other secondary cataract, left eye: Secondary | ICD-10-CM | POA: Diagnosis not present

## 2015-10-14 DIAGNOSIS — H40052 Ocular hypertension, left eye: Secondary | ICD-10-CM | POA: Diagnosis not present

## 2015-10-14 DIAGNOSIS — H40051 Ocular hypertension, right eye: Secondary | ICD-10-CM | POA: Diagnosis not present

## 2015-10-14 DIAGNOSIS — H04123 Dry eye syndrome of bilateral lacrimal glands: Secondary | ICD-10-CM | POA: Diagnosis not present

## 2015-10-27 ENCOUNTER — Ambulatory Visit (INDEPENDENT_AMBULATORY_CARE_PROVIDER_SITE_OTHER): Payer: Medicare Other | Admitting: *Deleted

## 2015-10-27 DIAGNOSIS — I4891 Unspecified atrial fibrillation: Secondary | ICD-10-CM | POA: Diagnosis not present

## 2015-10-27 DIAGNOSIS — I341 Nonrheumatic mitral (valve) prolapse: Secondary | ICD-10-CM | POA: Diagnosis not present

## 2015-10-27 DIAGNOSIS — I348 Other nonrheumatic mitral valve disorders: Secondary | ICD-10-CM | POA: Diagnosis not present

## 2015-10-27 LAB — BASIC METABOLIC PANEL
BUN: 14 mg/dL (ref 7–25)
CO2: 31 mmol/L (ref 20–31)
Calcium: 9.1 mg/dL (ref 8.6–10.4)
Chloride: 102 mmol/L (ref 98–110)
Creat: 0.7 mg/dL (ref 0.60–0.93)
Glucose, Bld: 94 mg/dL (ref 65–99)
Potassium: 3.8 mmol/L (ref 3.5–5.3)
Sodium: 139 mmol/L (ref 135–146)

## 2015-10-27 LAB — CBC WITH DIFFERENTIAL/PLATELET
Basophils Absolute: 0.1 10*3/uL (ref 0.0–0.1)
Basophils Relative: 1 % (ref 0–1)
Eosinophils Absolute: 0.1 10*3/uL (ref 0.0–0.7)
Eosinophils Relative: 2 % (ref 0–5)
HCT: 37.3 % (ref 36.0–46.0)
Hemoglobin: 12.5 g/dL (ref 12.0–15.0)
Lymphocytes Relative: 25 % (ref 12–46)
Lymphs Abs: 1.4 10*3/uL (ref 0.7–4.0)
MCH: 30.7 pg (ref 26.0–34.0)
MCHC: 33.5 g/dL (ref 30.0–36.0)
MCV: 91.6 fL (ref 78.0–100.0)
MPV: 10.9 fL (ref 8.6–12.4)
Monocytes Absolute: 0.4 10*3/uL (ref 0.1–1.0)
Monocytes Relative: 8 % (ref 3–12)
Neutro Abs: 3.6 10*3/uL (ref 1.7–7.7)
Neutrophils Relative %: 64 % (ref 43–77)
Platelets: 226 10*3/uL (ref 150–400)
RBC: 4.07 MIL/uL (ref 3.87–5.11)
RDW: 12.9 % (ref 11.5–15.5)
WBC: 5.6 10*3/uL (ref 4.0–10.5)

## 2015-10-27 NOTE — Patient Instructions (Addendum)
Conference with Chauncey Mann. pharmacist Pt was started on Xarelto for on  09/29/2015  Awaiting lab results Hgb and HCT  BMET Weight 166. lbs  A full discussion of the nature of anticoagulants has been carried out.  A benefit/risk analysis has been presented to the patient, so that they understand the justification for choosing anticoagulation with Xarelto at this time.  The need for compliance is stressed.  Pt is aware to take the medication once daily with the largest meal of the day.  Side effects of potential bleeding are discussed, including unusual colored urine or stools, coughing up blood or coffee ground emesis, nose bleeds or serious fall or head trauma.  Discussed signs and symptoms of stroke. The patient should avoid any OTC items containing aspirin or ibuprofen.  Avoid alcohol consumption.   Call if any signs of abnormal bleeding.  Discussed financial obligations and resolved any difficulty in obtaining medication.  Next lab test test in 6 months.  Reviewed medication list Appointment for September 4th at 9:15am

## 2015-10-27 NOTE — Progress Notes (Signed)
Conference between patient with Chauncey Mann pharmacist, regarding questions with xarelto and medication interactions. Aspirin stopped

## 2015-10-27 NOTE — Progress Notes (Signed)
Pt brought in BP readings and HR since starting metoprolol. She also takes Micardis HCT 80/12.5 in the AM and amlodipine 5mg  in the PM.  Her BP got as low as 102/57 and 103/64 so she started holding her amlodipine doses starting 2/25.  Since then her BP has been 112/63, 129/79, and 126/81. HR controlled in the 60s and 70s.  Given her BP is still controlled without the amlodipine and was low with it, have asked her to continue to hold.  She will call if her BP starts to increase.

## 2015-10-28 NOTE — Progress Notes (Signed)
Pt was started on Xarelto 44m QD for Afib on 09/29/2015.    Reviewed patients medication list.  Pt is not  currently on any combined P-gp and strong CYP3A4 inhibitors/inducers (ketoconazole, traconazole, ritonavir, carbamazepine, phenytoin, rifampin, St. John's wort).  Reviewed labs.  SCr 0.70, Weight 75.5 Kg, CrCl- 87.62.  Dose appropriate based on CrCl.   Hgb and HCT 12.5/37.3.   A full discussion of the nature of anticoagulants has been carried out.  A benefit/risk analysis has been presented to the patient, so that they understand the justification for choosing anticoagulation with Xarelto at this time.  The need for compliance is stressed.  Pt is aware to take the medication once daily with the largest meal of the day.  Side effects of potential bleeding are discussed, including unusual colored urine or stools, coughing up blood or coffee ground emesis, nose bleeds or serious fall or head trauma.  Discussed signs and symptoms of stroke. The patient should avoid any OTC items containing aspirin or ibuprofen.  Avoid alcohol consumption.   Call if any signs of abnormal bleeding.  Discussed financial obligations and resolved any difficulty in obtaining medication.  Next lab test test in 6 months.

## 2015-10-31 ENCOUNTER — Telehealth: Payer: Self-pay

## 2015-10-31 MED ORDER — DILTIAZEM HCL ER COATED BEADS 120 MG PO CP24: 120.0000 mg | ORAL_CAPSULE | Freq: Every day | ORAL | Status: DC

## 2015-10-31 NOTE — Telephone Encounter (Signed)
Need clarification. Is diltiazem prn for atrial fibrillation? Also, does patient need to start back on Norvasc?

## 2015-10-31 NOTE — Telephone Encounter (Signed)
No amlodipine. Daily diltiazem CD.

## 2015-10-31 NOTE — Telephone Encounter (Signed)
Patient walked in to leave a message for Dr. Aundra Dubin. Patient stated, "I have been having swelling in my hands and fingers since taking metoprolol. It started on Friday."  Patient started xarelto and metoprolol for A. Fib. On 09/29/15. Patient recently was instructed to stop norvasc due to low BP on 10/27/15. Patient stated her BP and HR are fine. Patient just wants Dr. Aundra Dubin to know the metoprolol is causing her fingers and hands to swell. Will forward to Dr. Aundra Dubin for further advisement.

## 2015-10-31 NOTE — Telephone Encounter (Signed)
Called patient about her medication changes. Patient verbalized understanding.

## 2015-10-31 NOTE — Telephone Encounter (Signed)
Have her stop Toprol XL.  Since she is also off amlodipine, she can start diltiazem CD 120 daily for HR control when she goes into atrial fibrillation.  She should do ok on this since I don't think she had problems with Norvasc other than she did not need because of low BP.  I think she has an appointment soon with me.

## 2015-11-03 ENCOUNTER — Other Ambulatory Visit: Payer: Self-pay | Admitting: Internal Medicine

## 2015-11-04 ENCOUNTER — Encounter: Payer: Self-pay | Admitting: *Deleted

## 2015-11-08 ENCOUNTER — Encounter: Payer: Self-pay | Admitting: Cardiology

## 2015-11-08 ENCOUNTER — Ambulatory Visit (INDEPENDENT_AMBULATORY_CARE_PROVIDER_SITE_OTHER): Payer: Medicare Other | Admitting: Cardiology

## 2015-11-08 VITALS — BP 122/72 | HR 60 | Ht 63.0 in | Wt 168.0 lb

## 2015-11-08 DIAGNOSIS — I341 Nonrheumatic mitral (valve) prolapse: Secondary | ICD-10-CM | POA: Diagnosis not present

## 2015-11-08 DIAGNOSIS — R0683 Snoring: Secondary | ICD-10-CM

## 2015-11-08 DIAGNOSIS — I10 Essential (primary) hypertension: Secondary | ICD-10-CM | POA: Diagnosis not present

## 2015-11-08 DIAGNOSIS — G4719 Other hypersomnia: Secondary | ICD-10-CM

## 2015-11-08 DIAGNOSIS — I4891 Unspecified atrial fibrillation: Secondary | ICD-10-CM | POA: Diagnosis not present

## 2015-11-08 DIAGNOSIS — I480 Paroxysmal atrial fibrillation: Secondary | ICD-10-CM

## 2015-11-08 LAB — TSH: TSH: 2.67 mIU/L

## 2015-11-08 MED ORDER — TRAMADOL HCL 50 MG PO TABS: 50.0000 mg | ORAL_TABLET | Freq: Two times a day (BID) | ORAL | Status: DC | PRN

## 2015-11-08 NOTE — Patient Instructions (Signed)
Medication Instructions:  Your physician has recommended you make the following change in your medication:  Do not take Ibuprofen. Can take tylenol as needed.  If tylenol does not work may take Tramadol 50 mg by mouth twice daily as needed.    Labwork: Lab work to be done today--TSH  Testing/Procedures: Your physician has requested that you have an echocardiogram. Echocardiography is a painless test that uses sound waves to create images of your heart. It provides your doctor with information about the size and shape of your heart and how well your heart's chambers and valves are working. This procedure takes approximately one hour. There are no restrictions for this procedure.  Your physician has recommended that you have a sleep study. This test records several body functions during sleep, including: brain activity, eye movement, oxygen and carbon dioxide blood levels, heart rate and rhythm, breathing rate and rhythm, the flow of air through your mouth and nose, snoring, body muscle movements, and chest and belly movement.   Follow-Up: Your physician wants you to follow-up in: 6 months.  You will receive a reminder letter in the mail two months in advance. If you don't receive a letter, please call our office to schedule the follow-up appointment.   Any Other Special Instructions Will Be Listed Below (If Applicable).     If you need a refill on your cardiac medications before your next appointment, please call your pharmacy.

## 2015-11-08 NOTE — Progress Notes (Signed)
Patient ID: Colleen White, female   DOB: [birth year], 70 y.o.   MRN: [id number] PCP: Dr. Ronnald Ramp  70 yo with history of HTN, hyperlipidemia, paroxysmal atrial fibrillation and prior non-Hodgkins lymphoma presents for cardiology followup.  She called the office in 1/17 to report increased episodes of palpitations.  She had had rare tachypalpitations for years, but in 1/17 she had 2 episodes of tachypalpitations close together.  I had her wear an event monitor.  By monitor, her tachypalpitations correlated with atrial fibrillation with RVR.  She was started on Xarelto and Toprol XL.  Toprol XL caused swelling, so it was stopped and diltiazem CD was begun.  She is now doing well, no recent palpitations.  No exertional dyspnea or chest pain.  When she has the palpitations, she feels fatigued.  No lightheadedness or syncope.  Of note, she does report feeling sleepy/tired during the day with snoring at night.   ECG: NSR, normal  Labs (4/15): K 3.7, creatinine 0.6, LDL 111, HDL 46 Labs (4/16): LDL 89 Labs (3/17): K 3.8, creatinine 0.7, HCT 37.3  PMH: 1. Echo (11/15): EF 60-65%, trivial MR.  2. Chest pain: Myoview normal in 2/05. Stress echo (11/15) with no evidence for ischemia or infarction.  3. Suspected glaucoma 4. HTN 5. Hyperlipidemia 6. GERD 7. Diverticulosis 8. TAH/BSO 9. Non-Hodgkins lymphoma: In remission.  Treated at [facility] with surgery and XRT.  Did not have chemotherapy.  10. Pulmonary nodules: Presumed benign after observation.  11. Atrial fibrillation: Paroxysmal.   SH: Married, cares for elderly mother, nonsmoker.  Lives in [place].  FH: Mother with atrial fibrillation and CAD.   ROS: All systems reviewed and negative except as per HPI.   Current Outpatient Prescriptions  Medication Sig Dispense Refill  . ALPRAZolam (XANAX) 0.5 MG tablet Take 1 tablet (0.5 mg total) by mouth 2 (two) times daily as needed for anxiety. 30 tablet 0  . brimonidine-timolol (COMBIGAN) 0.2-0.5 %  ophthalmic solution Place 1 drop into both eyes every 12 (twelve) hours.      . Calcium Citrate (CITRACAL PO) Take 1 tablet by mouth daily.    . cholecalciferol (VITAMIN D) 1000 UNITS tablet Take 1,000 Units by mouth daily.      . cyclobenzaprine (FLEXERIL) 10 MG tablet TAKE 1 TABLET THREE TIMES DAILY AS NEEDED FOR MUSCLE SPASMS. 50 tablet 3  . diltiazem (CARDIZEM CD) 120 MG 24 hr capsule Take 1 capsule (120 mg total) by mouth daily. 30 capsule 11  . docusate sodium (COLACE) 100 MG capsule Take once daily     . esomeprazole (NEXIUM) 40 MG capsule Take 1 capsule (40 mg total) by mouth daily. 30 minutes before the evening meal 30 capsule 6  . loratadine (CLARITIN) 10 MG tablet Take 10 mg by mouth daily. As needed     . Minoxidil 5 % FOAM Apply 3-5 mLs topically at bedtime. Apply to Scalp for hair loss at bedtime     . mirtazapine (REMERON) 15 MG tablet Take 7.5 mg by mouth as needed (as need for itchy scalp).     . Multiple Vitamins-Minerals (CENTRUM) tablet Take 1 tablet by mouth daily.      . Multiple Vitamins-Minerals (PRESERVISION AREDS 2 PO) Take 1 tablet by mouth daily.    . Omega-3 Fatty Acids (OMEGA 3 PO) Take 1 capsule by mouth daily.    . pravastatin (PRAVACHOL) 40 MG tablet Take 1 tablet (40 mg total) by mouth daily. Yearly physical w/labs due in April must see MD for refills  30 tablet 0  . Probiotic Product (PROBIOTIC DAILY PO) Take 1 tablet by mouth daily.    . rivaroxaban (XARELTO) 20 MG TABS tablet Take 1 tablet (20 mg total) by mouth daily with supper. 30 tablet 1  . telmisartan-hydrochlorothiazide (MICARDIS HCT) 80-12.5 MG tablet Take 1 tablet by mouth daily. 30 tablet 11  . tretinoin (RETIN-A) 0.05 % cream Once daily     . vitamin C (ASCORBIC ACID) 500 MG tablet Take 500 mg by mouth daily.    . traMADol (ULTRAM) 50 MG tablet Take 1 tablet (50 mg total) by mouth 2 (two) times daily as needed. 30 tablet 0   No current facility-administered medications for this visit.    BP  122/72 mmHg  Pulse 60  Ht 5' 3"  (1.6 m)  Wt 168 lb (76.204 kg)  BMI 29.77 kg/m2 General: NAD Neck: No JVD, no thyromegaly or thyroid nodule.  Lungs: Clear to auscultation bilaterally with normal respiratory effort. CV: Nondisplaced PMI.  Heart regular S1/S2, no S3/S4, 2/6 HSM at apex.  No peripheral edema.  No carotid bruit.  Normal pedal pulses.  Abdomen: Soft, nontender, no hepatosplenomegaly, no distention.  Skin: Intact without lesions or rashes.  Neurologic: Alert and oriented x 3.  Psych: Normal affect. Extremities: No clubbing or cyanosis.  HEENT: Normal.   Assessment/Plan: 1. HTN: BP is controlled on current regimen, continue.  2. Hyperlipidemia: Reasonable lipids in 4/16.  3. Atrial fibrillation: Paroxysmal.  First noted in 1/17.  Rare episodes, no symptomatic episodes so far while on diltiazem CD.   CHADSVASC = 3.  - Continue diltiazem CD 120 daily.  - Continue Xarelto 20 daily.  - I will get a repeat echo given onset of PAF.  - Possible OSA based on symptoms => I will arrange for sleep study.  - Check TSH.  - Avoid NSAIDs while on Xarelto.  She sometimes needs something stronger than Tylenol for orthopedic pain.  I will give her a limited supply of tramadol.   [name] 11/08/2015

## 2015-11-10 ENCOUNTER — Other Ambulatory Visit: Payer: Self-pay | Admitting: *Deleted

## 2015-11-21 ENCOUNTER — Other Ambulatory Visit: Payer: Self-pay

## 2015-11-21 ENCOUNTER — Ambulatory Visit (HOSPITAL_COMMUNITY): Payer: Medicare Other | Attending: Cardiology

## 2015-11-21 DIAGNOSIS — I340 Nonrheumatic mitral (valve) insufficiency: Secondary | ICD-10-CM | POA: Insufficient documentation

## 2015-11-21 DIAGNOSIS — I341 Nonrheumatic mitral (valve) prolapse: Secondary | ICD-10-CM | POA: Diagnosis not present

## 2015-11-21 DIAGNOSIS — Z87891 Personal history of nicotine dependence: Secondary | ICD-10-CM | POA: Diagnosis not present

## 2015-11-21 DIAGNOSIS — I4891 Unspecified atrial fibrillation: Secondary | ICD-10-CM

## 2015-11-21 DIAGNOSIS — R911 Solitary pulmonary nodule: Secondary | ICD-10-CM | POA: Insufficient documentation

## 2015-11-21 DIAGNOSIS — I059 Rheumatic mitral valve disease, unspecified: Secondary | ICD-10-CM | POA: Diagnosis present

## 2015-11-21 DIAGNOSIS — E785 Hyperlipidemia, unspecified: Secondary | ICD-10-CM | POA: Diagnosis not present

## 2015-11-21 DIAGNOSIS — I119 Hypertensive heart disease without heart failure: Secondary | ICD-10-CM | POA: Diagnosis not present

## 2015-11-21 DIAGNOSIS — I071 Rheumatic tricuspid insufficiency: Secondary | ICD-10-CM | POA: Diagnosis not present

## 2015-11-21 DIAGNOSIS — N6019 Diffuse cystic mastopathy of unspecified breast: Secondary | ICD-10-CM | POA: Diagnosis not present

## 2015-11-21 DIAGNOSIS — C8590 Non-Hodgkin lymphoma, unspecified, unspecified site: Secondary | ICD-10-CM | POA: Insufficient documentation

## 2015-11-21 DIAGNOSIS — Z8249 Family history of ischemic heart disease and other diseases of the circulatory system: Secondary | ICD-10-CM | POA: Diagnosis not present

## 2015-11-24 ENCOUNTER — Other Ambulatory Visit: Payer: Self-pay | Admitting: Internal Medicine

## 2015-11-24 ENCOUNTER — Other Ambulatory Visit: Payer: Self-pay | Admitting: Cardiology

## 2015-12-21 ENCOUNTER — Encounter: Payer: Self-pay | Admitting: Internal Medicine

## 2015-12-21 ENCOUNTER — Ambulatory Visit (INDEPENDENT_AMBULATORY_CARE_PROVIDER_SITE_OTHER)
Admission: RE | Admit: 2015-12-21 | Discharge: 2015-12-21 | Disposition: A | Discharge status: Home / Self Care | Payer: Medicare Other | Source: Ambulatory Visit | Attending: Internal Medicine | Admitting: Internal Medicine

## 2015-12-21 ENCOUNTER — Other Ambulatory Visit (INDEPENDENT_AMBULATORY_CARE_PROVIDER_SITE_OTHER): Payer: Medicare Other

## 2015-12-21 ENCOUNTER — Ambulatory Visit (INDEPENDENT_AMBULATORY_CARE_PROVIDER_SITE_OTHER): Payer: Medicare Other | Admitting: Internal Medicine

## 2015-12-21 VITALS — BP 110/78 | HR 67 | Temp 98.1°F | Resp 16 | Ht 63.0 in | Wt 162.0 lb

## 2015-12-21 DIAGNOSIS — E7800 Pure hypercholesterolemia, unspecified: Secondary | ICD-10-CM | POA: Diagnosis not present

## 2015-12-21 DIAGNOSIS — R05 Cough: Secondary | ICD-10-CM

## 2015-12-21 DIAGNOSIS — I10 Essential (primary) hypertension: Secondary | ICD-10-CM | POA: Diagnosis not present

## 2015-12-21 DIAGNOSIS — R059 Cough, unspecified: Secondary | ICD-10-CM

## 2015-12-21 DIAGNOSIS — M949 Disorder of cartilage, unspecified: Secondary | ICD-10-CM | POA: Diagnosis not present

## 2015-12-21 DIAGNOSIS — E538 Deficiency of other specified B group vitamins: Secondary | ICD-10-CM

## 2015-12-21 DIAGNOSIS — Z0000 Encounter for general adult medical examination without abnormal findings: Secondary | ICD-10-CM | POA: Diagnosis not present

## 2015-12-21 DIAGNOSIS — Z23 Encounter for immunization: Secondary | ICD-10-CM

## 2015-12-21 DIAGNOSIS — M899 Disorder of bone, unspecified: Secondary | ICD-10-CM

## 2015-12-21 LAB — LIPID PANEL
Cholesterol: 163 mg/dL (ref 0–200)
HDL: 46 mg/dL (ref >39.00)
LDL Cholesterol: 93 mg/dL (ref 0–99)
NonHDL: 117.33
Total CHOL/HDL Ratio: 4
Triglycerides: 122 mg/dL (ref 0.0–149.0)
VLDL: 24.4 mg/dL (ref 0.0–40.0)

## 2015-12-21 LAB — FOLATE: Folate: >23.7 ng/mL (ref >5.9)

## 2015-12-21 LAB — MAGNESIUM: Magnesium: 2.1 mg/dL (ref 1.5–2.5)

## 2015-12-21 LAB — VITAMIN D 25 HYDROXY (VIT D DEFICIENCY, FRACTURES): VITD: 30.32 ng/mL (ref 30.00–100.00)

## 2015-12-21 LAB — VITAMIN B12: Vitamin B-12: 323 pg/mL (ref 211–911)

## 2015-12-21 MED ORDER — HYDROCOD POLST-CPM POLST ER 10-8 MG/5ML PO SUER: 5.0000 mL | Freq: Every evening | ORAL | Status: DC | PRN

## 2015-12-21 NOTE — Patient Instructions (Signed)

## 2015-12-21 NOTE — Progress Notes (Signed)
Pre visit review using our clinic review tool, if applicable. No additional management support is needed unless otherwise documented below in the visit note. 

## 2015-12-21 NOTE — Progress Notes (Signed)
Subjective:  Patient ID: Colleen White, female    DOB: 1945-09-19  Age: 70 y.o. MRN: [id number]  CC: Annual Exam; Hyperlipidemia; and Cough   HPI Osa Schrag presents for a CPX -  She complains of chronic, intermittent nonproductive cough that occurs when she lays down at night and goes to bed. She thinks this is related to postnasal drip and runny nose. She denies night sweats, fever, chills, chest pain, shortness of breath, edema, weight loss, or wheezing. She has taken Tussionex in the past for symptom relief and is requesting a refill.  She also tells me that she has a history of vitamin B12 deficiency and wants to have her level checked. She has osteopenia and needs a vitamin D level checked.  Her blood pressure has been well controlled on the combination of Cardizem, an ARB, and hydrochlorothiazide.  She is doing well on the cholesterol medication with no muscle or joint aches.  Outpatient Prescriptions Prior to Visit  Medication Sig Dispense Refill  . brimonidine-timolol (COMBIGAN) 0.2-0.5 % ophthalmic solution Place 1 drop into both eyes every 12 (twelve) hours.      . cholecalciferol (VITAMIN D) 1000 UNITS tablet Take 1,000 Units by mouth daily.      [name] diltiazem (CARDIZEM CD) 120 MG 24 hr capsule Take 1 capsule (120 mg total) by mouth daily. 30 capsule 11  . docusate sodium (COLACE) 100 MG capsule Take once daily     . esomeprazole (NEXIUM) 40 MG capsule Take 1 capsule (40 mg total) by mouth daily. 30 minutes before the evening meal 30 capsule 6  . loratadine (CLARITIN) 10 MG tablet Take 10 mg by mouth daily. As needed     . Minoxidil 5 % FOAM Apply 3-5 mLs topically at bedtime. Apply to Scalp for hair loss at bedtime     . mirtazapine (REMERON) 15 MG tablet Take 7.5 mg by mouth as needed (as need for itchy scalp).     . Multiple Vitamins-Minerals (CENTRUM) tablet Take 1 tablet by mouth daily.      . Multiple Vitamins-Minerals (PRESERVISION AREDS 2 PO) Take 1 tablet by  mouth daily.    . Omega-3 Fatty Acids (OMEGA 3 PO) Take 1 capsule by mouth daily.    . pravastatin (PRAVACHOL) 40 MG tablet TAKE 1 TABLET ONCE DAILY. 30 tablet 0  . Probiotic Product (PROBIOTIC DAILY PO) Take 1 tablet by mouth daily.    [name] telmisartan-hydrochlorothiazide (MICARDIS HCT) 80-12.5 MG tablet Take 1 tablet by mouth daily. 30 tablet 11  . traMADol (ULTRAM) 50 MG tablet Take 1 tablet (50 mg total) by mouth 2 (two) times daily as needed. 30 tablet 0  . tretinoin (RETIN-A) 0.05 % cream Once daily     . vitamin C (ASCORBIC ACID) 500 MG tablet Take 500 mg by mouth daily.    Alveda Reasons 20 MG TABS tablet TAKE 1 TABLET DAILY WITH SUPPER. 30 tablet 10  . ALPRAZolam (XANAX) 0.5 MG tablet Take 1 tablet (0.5 mg total) by mouth 2 (two) times daily as needed for anxiety. 30 tablet 0  . Calcium Citrate (CITRACAL PO) Take 1 tablet by mouth daily.    . cyclobenzaprine (FLEXERIL) 10 MG tablet TAKE 1 TABLET THREE TIMES DAILY AS NEEDED FOR MUSCLE SPASMS. 50 tablet 3   No facility-administered medications prior to visit.    ROS Review of Systems  Constitutional: Negative.  Negative for fever, chills, diaphoresis, appetite change and fatigue.  HENT: Positive for postnasal drip and rhinorrhea. Negative for  congestion, dental problem, ear pain, facial swelling, nosebleeds, sinus pressure, sore throat, trouble swallowing and voice change.   Eyes: Negative.   Respiratory: Positive for cough. Negative for choking, chest tightness, shortness of breath and stridor.   Cardiovascular: Negative.  Negative for chest pain, palpitations and leg swelling.  Gastrointestinal: Negative.  Negative for nausea, abdominal pain, diarrhea and constipation.  Endocrine: Negative.   Genitourinary: Negative.  Negative for dysuria, urgency, frequency, hematuria, flank pain, difficulty urinating and pelvic pain.  Musculoskeletal: Negative.   Skin: Negative.   Allergic/Immunologic: Negative.   Neurological: Negative.     Hematological: Negative.  Negative for adenopathy. Does not bruise/bleed easily.  Psychiatric/Behavioral: Negative.     Objective:  BP 110/78 mmHg  Pulse 67  Temp(Src) 98.1 F (36.7 C) (Oral)  Resp 16  Ht 5\' 3"  (1.6 m)  Wt 162 lb (73.483 kg)  BMI 28.70 kg/m2  SpO2 97%  BP Readings from Last 3 Encounters:  12/21/15 110/78  11/08/15 122/72  07/28/15 138/78    Wt Readings from Last 3 Encounters:  12/21/15 162 lb (73.483 kg)  11/08/15 168 lb (76.204 kg)  07/28/15 165 lb (74.844 kg)    Physical Exam  Constitutional: She is oriented to person, place, and time. She appears well-developed and well-nourished. No distress.  HENT:  Head: Normocephalic and atraumatic.  Mouth/Throat: Oropharynx is clear and moist. No oropharyngeal exudate.  Eyes: Conjunctivae are normal. Right eye exhibits no discharge. Left eye exhibits no discharge. No scleral icterus.  Neck: Normal range of motion. Neck supple. No JVD present. No tracheal deviation present. No thyromegaly present.  Cardiovascular: Normal rate, regular rhythm, normal heart sounds and intact distal pulses.  Exam reveals no gallop and no friction rub.   No murmur heard. Pulmonary/Chest: Effort normal and breath sounds normal. No stridor. No respiratory distress. She has no wheezes. She has no rales. She exhibits no tenderness.  Abdominal: Soft. Bowel sounds are normal. She exhibits no distension and no mass. There is no tenderness. There is no rebound and no guarding.  Musculoskeletal: Normal range of motion. She exhibits no edema or tenderness.  Lymphadenopathy:    She has no cervical adenopathy.  Neurological: She is oriented to person, place, and time.  Skin: Skin is warm and dry. No rash noted. She is not diaphoretic. No erythema. No pallor.  Vitals reviewed.   Lab Results  Component Value Date   WBC 5.6 10/27/2015   HGB 12.5 10/27/2015   HCT 37.3 10/27/2015   PLT 226 10/27/2015   GLUCOSE 94 10/27/2015   CHOL 163  12/21/2015   TRIG 122.0 12/21/2015   HDL 46.00 12/21/2015   LDLDIRECT 142.5 09/30/2012   LDLCALC 93 12/21/2015   ALT 20 12/15/2014   AST 21 12/15/2014   NA 139 10/27/2015   K 3.8 10/27/2015   CL 102 10/27/2015   CREATININE 0.70 10/27/2015   BUN 14 10/27/2015   CO2 31 10/27/2015   TSH 2.67 11/08/2015   HGBA1C 5.7 08/09/2009    Dg Bone Density  09/29/2015  EXAM: DUAL X-RAY ABSORPTIOMETRY (DXA) FOR BONE MINERAL DENSITY IMPRESSION: Referring Physician:  MODY, VAISHALI PATIENT: Name: CURLEY, GRGAS Patient ID: [id number] Birth Date: 1945-12-04 Height: 63.0 in. Sex: Female Measured: 09/29/2015 Weight: 168.0 lbs. Indications: Advanced Age, Bilateral Ovariectomy (65.51), Caucasian, Estrogen Deficient, Height Loss (781.91), Hysterectomy, Nexium, Postmenopausal, Secondary Osteoporosis Fractures: Treatments: Calcium (E943.0), Vitamin D (E933.5) ASSESSMENT: The BMD measured at Femur Neck Left is 0.881 g/cm2 with a T-score of -1.1. This patient is  considered to be osteopenic according to [facility]) criteria. L-4 was excluded due to degenerative changes. There has been a statistically significant decrease in BMD of Left hip since prior exam dated 02/08/2011. Site Region Measured Date Measured Age YA BMD Significant CHANGE T-score DualFemur Neck Left 09/29/2015    [age]         -1.1    0.881 g/cm2 * AP Spine  L1-L3     09/29/2015    70.8         -0.6    1.110 g/cm2 World Health Organization [facility]) criteria for post-menopausal, Caucasian Women: Normal       T-score at or above -1 SD Osteopenia   T-score between -1 and -2.5 SD Osteoporosis T-score at or below -2.5 SD RECOMMENDATION: [facility] recommends that FDA-approved medical therapies be considered in postmenopausal women and men age 31 or older with a: 1. Hip or vertebral (clinical or morphometric) fracture. 2. T-score of <-2.5 at the spine or hip. 3. Ten-year fracture probability by FRAX of 3% or greater for hip  fracture or 20% or greater for major osteoporotic fracture. All treatment decisions require clinical judgment and consideration of individual patient factors, including patient preferences, co-morbidities, previous drug use, risk factors not captured in the FRAX model (e.g. falls, vitamin D deficiency, increased bone turnover, interval significant decline in bone density) and possible under - or over-estimation of fracture risk by FRAX. All patients should ensure an adequate intake of dietary calcium (1200 mg/d) and vitamin D (800 IU daily) unless contraindicated. FOLLOW-UP: People with diagnosed cases of osteoporosis or at high risk for fracture should have regular bone mineral density tests. For patients eligible for Medicare, routine testing is allowed once every 2 years. The testing frequency can be increased to one year for patients who have rapidly progressing disease, those who are receiving or discontinuing medical therapy to restore bone mass, or have additional risk factors. I have reviewed this report, and agree with the above findings. [facility] FRAX* 10-year Probability of Fracture Based on femoral neck BMD: DualFemur (Left) Major Osteoporotic Fracture: 9.0% Hip Fracture:                1.1% Population:                  [place] (Caucasian) Risk Factors:                Secondary Osteoporosis *FRAX is a [place] [facility] [employer] for Metabolic Bone Disease, a [place] (WHO) [facility]. ASSESSMENT: The probability of a major osteoporotic fracture is 9.0 % within the next ten years. The probability of a hip fracture is 1.1 % within the next ten years. Electronically Signed   By: David  Martinique M.D.   On: 09/29/2015 14:57    Assessment & Plan:   Rodas was seen today for annual exam, hyperlipidemia and cough.  Diagnoses and all orders for this visit:  HYPERCHOLESTEROLEMIA- she has achieved her LDL goal is doing well on the  statin. -     Lipid panel; Future  Essential hypertension- her blood pressure is well-controlled, recent electrolytes and renal function are normal.  Disorder of bone and cartilage -     Magnesium; Future -     VITAMIN D 25 Hydroxy (Vit-D Deficiency, Fractures); Future  Bone/cartilage disorder -     Magnesium; Future -     VITAMIN D 25 Hydroxy (Vit-D Deficiency, Fractures); Future  B12 deficiency- her B12 level is normal, she will continue B12 replacement therapy -     Vitamin B12; Future -     Folate; Future  Cough- cough is most consistent with symptoms related to allergic rhinitis, will continue Tussionex as needed for symptom relief. -     DG Chest 2 View; Future -     chlorpheniramine-HYDROcodone (TUSSIONEX PENNKINETIC ER) 10-8 MG/5ML SUER; Take 5 mLs by mouth at bedtime as needed for cough.  Need for 23-polyvalent pneumococcal polysaccharide vaccine -     Pneumococcal polysaccharide vaccine 23-valent greater than or equal to 2yo subcutaneous/IM  I have discontinued Ms. Keefe's cyclobenzaprine, ALPRAZolam, and Calcium Citrate (CITRACAL PO). I am also having her start on chlorpheniramine-HYDROcodone. Additionally, I am having her maintain her CENTRUM, loratadine, docusate sodium, brimonidine-timolol, cholecalciferol, tretinoin, mirtazapine, Minoxidil, esomeprazole, telmisartan-hydrochlorothiazide, diltiazem, Multiple Vitamins-Minerals (PRESERVISION AREDS 2 PO), Omega-3 Fatty Acids (OMEGA 3 PO), vitamin C, Probiotic Product (PROBIOTIC DAILY PO), traMADol, XARELTO, and pravastatin.  Meds ordered this encounter  Medications  . chlorpheniramine-HYDROcodone (TUSSIONEX PENNKINETIC ER) 10-8 MG/5ML SUER    Sig: Take 5 mLs by mouth at bedtime as needed for cough.    Dispense:  140 mL    Refill:  0   See AVS for instructions about healthy living and anticipatory guidance.  Follow-up: Return if symptoms worsen or fail to improve.  Scarlette Calico, MD

## 2015-12-22 ENCOUNTER — Other Ambulatory Visit: Payer: Self-pay | Admitting: Internal Medicine

## 2015-12-29 DIAGNOSIS — R928 Other abnormal and inconclusive findings on diagnostic imaging of breast: Secondary | ICD-10-CM | POA: Diagnosis not present

## 2015-12-29 DIAGNOSIS — R922 Inconclusive mammogram: Secondary | ICD-10-CM | POA: Diagnosis not present

## 2015-12-30 ENCOUNTER — Encounter (HOSPITAL_BASED_OUTPATIENT_CLINIC_OR_DEPARTMENT_OTHER): Payer: Medicare Other

## 2016-01-03 ENCOUNTER — Other Ambulatory Visit: Payer: Medicare Other

## 2016-01-04 ENCOUNTER — Telehealth: Payer: Self-pay

## 2016-01-04 ENCOUNTER — Other Ambulatory Visit (INDEPENDENT_AMBULATORY_CARE_PROVIDER_SITE_OTHER): Payer: Medicare Other

## 2016-01-04 DIAGNOSIS — Z1211 Encounter for screening for malignant neoplasm of colon: Secondary | ICD-10-CM

## 2016-01-04 LAB — HEMOCCULT SLIDES (X 3 CARDS)
Fecal Occult Blood: NEGATIVE
OCCULT 1: NEGATIVE
OCCULT 2: NEGATIVE
OCCULT 3: NEGATIVE
OCCULT 4: NEGATIVE
OCCULT 5: NEGATIVE

## 2016-01-04 NOTE — Telephone Encounter (Signed)
Spoke with pt to confirm symptoms informed pt that the cards will only r/o blood in stool. Lab ordered Advised office visit for her symptoms she declined.

## 2016-01-04 NOTE — Telephone Encounter (Signed)
Calling pt to see why IFOB test was dropped off in lab do not see anything in OV note to indicate this. I need to know so I can kno what Dx code to associate this with and to see if pt is experiencing any symptoms

## 2016-01-04 NOTE — Telephone Encounter (Signed)
Pt call stated that she is experiencing some abdominal pain lower quadrants and some gas.

## 2016-01-08 ENCOUNTER — Encounter: Payer: Self-pay | Admitting: Internal Medicine

## 2016-01-24 DIAGNOSIS — C8290 Follicular lymphoma, unspecified, unspecified site: Secondary | ICD-10-CM | POA: Diagnosis not present

## 2016-02-13 ENCOUNTER — Other Ambulatory Visit: Payer: Self-pay | Admitting: Cardiology

## 2016-02-13 ENCOUNTER — Other Ambulatory Visit: Payer: Self-pay | Admitting: Internal Medicine

## 2016-02-13 NOTE — Telephone Encounter (Signed)
Dr Aundra Dubin would not prescribe long term-if she feels she needs a refill she should discuss with her PCP.

## 2016-02-14 NOTE — Telephone Encounter (Signed)
Please advise, thanks.

## 2016-02-14 NOTE — Telephone Encounter (Signed)
Faxed to [facility] 

## 2016-02-15 ENCOUNTER — Ambulatory Visit (HOSPITAL_BASED_OUTPATIENT_CLINIC_OR_DEPARTMENT_OTHER): Payer: Medicare Other | Attending: Cardiology | Admitting: Cardiology

## 2016-02-15 VITALS — Ht 63.0 in | Wt 162.0 lb

## 2016-02-15 DIAGNOSIS — R0683 Snoring: Secondary | ICD-10-CM | POA: Insufficient documentation

## 2016-02-15 DIAGNOSIS — G4733 Obstructive sleep apnea (adult) (pediatric): Secondary | ICD-10-CM | POA: Diagnosis not present

## 2016-02-23 ENCOUNTER — Telehealth: Payer: Self-pay | Admitting: Cardiology

## 2016-02-23 NOTE — Procedures (Signed)
Patient Name: Colleen White, Colleen White MRN: [id number] Study Date: 02/15/2016 Gender: Female D.O.B: [birth year] Age (years): 65 Referring Provider: [name] Interpreting Physician: Fransico Him MD, ABSM RPSGT: Zadie Rhine  Height (inches): 63 Weight (lbs): 162 BMI: 29 Neck Size: 14.00  CLINICAL INFORMATION Sleep Study Type: Split Night CPAP Indication for sleep study: Snoring     SLEEP STUDY TECHNIQUE As per the AASM Manual for the Scoring of Sleep and Associated Events v2.3 (April 2016) with a hypopnea requiring 4% desaturations. The channels recorded and monitored were frontal, central and occipital EEG, electrooculogram (EOG), submentalis EMG (chin), nasal and oral airflow, thoracic and abdominal wall motion, anterior tibialis EMG, snore microphone, electrocardiogram, and pulse oximetry. Continuous positive airway pressure (CPAP) was initiated when the patient met split night criteria and was titrated according to treat sleep-disordered breathing.  MEDICATIONS Medications taken by the patient : Reviewed in the chart. Medications administered by patient during sleep study : No sleep medicine administered.  RESPIRATORY PARAMETERS Diagnostic Total AHI (/hr): 9.1  RDI (/hr):16.5  OA Index (/hr): 0.3  CA Index (/hr): 0.0 REM AHI (/hr): 53.9  NREM AHI (/hr):2.2 Supine AHI (/hr):9.1  Non-supine AHI (/hr):N/A Min O2 Sat (%):82.00  Mean O2 (%):95.38 Time below 88% (min):2.6    Titration Optimal Pressure (cm):7  AHI at Optimal Pressure (/hr):0.0  Min O2 at Optimal Pressure (%):94.0 Supine % at Optimal (%):100  Sleep % at Optimal (%):96    SLEEP ARCHITECTURE The recording time for the entire night was 375.7 minutes. During a baseline period of 220.0 minutes, the patient slept for 185.5 minutes in REM and nonREM, yielding a sleep efficiency of 84.3%. Sleep onset after lights out was 1.2 minutes with a REM latency of 76.0 minutes. The patient spent 5.39% of the night in stage N1  sleep, 81.40% in stage N2 sleep, 0.00% in stage N3 and 13.21% in REM. During the titration period of 154.2 minutes, the patient slept for 96.5 minutes in REM and nonREM, yielding a sleep efficiency of 62.6%. Sleep onset after CPAP initiation was 55.2 minutes with a REM latency of 7.0 minutes. The patient spent 2.07% of the night in stage N1 sleep, 36.27% in stage N2 sleep, 0.00% in stage N3 and 61.66% in REM.  CARDIAC DATA The 2 lead EKG demonstrated sinus rhythm. The mean heart rate was 65.49 beats per minute. Other EKG findings include: None.  LEG MOVEMENT DATA The total Periodic Limb Movements of Sleep (PLMS) were 0. The PLMS index was 0.00 .  IMPRESSIONS - Mild obstructive sleep apnea occurred during the diagnostic portion of the study (AHI = 9.1 /hour). An optimal PAP pressure was selected for this patient ( 7 cm of water) - No significant central sleep apnea occurred during the diagnostic portion of the study (CAI = 0.0/hour). - During the diagnostic portion of the study the minimum O2 oxygen saturation was82.00%. - No snoring was audible during the diagnostic portion of the study. - No cardiac abnormalities were noted during this study. - Clinically significant periodic limb movements did not occur during sleep.  DIAGNOSIS - Obstructive Sleep Apnea (327.23 [G47.33 ICD-10])  RECOMMENDATIONS - Trial of CPAP therapy on 7 cm H2O with a Medium size Resmed Nasal Pillow Mask AirFit P10 mask and heated humidification. - Avoid alcohol, sedatives and other CNS depressants that may worsen sleep apnea and disrupt normal sleep architecture. - Sleep hygiene should be reviewed to assess factors that may improve sleep quality. - Weight management and regular exercise should be initiated or  continued. - Return to Sleep Center for re-evaluation after 10 weeks of therapy  [place], [facility]  ELECTRONICALLY SIGNED ON:  02/23/2016, 6:05 PM [place] PH: [phone]   FX: ([phone] [place]

## 2016-02-23 NOTE — Telephone Encounter (Signed)
Please let patient know that they have significant sleep apnea and had successful CPAP titration and will be set up with CPAP unit.  Please let DME know that order is in EPIC.  Please set patient up for OV in 10 weeks

## 2016-02-27 ENCOUNTER — Telehealth: Payer: Self-pay | Admitting: Cardiology

## 2016-02-27 NOTE — Telephone Encounter (Signed)
Spoke with patient to discuss her sleep study results. Let her know she has significant sleep apnea and was able to be titrated on the CPAP unit. Gave patient the opportunity to ask questions and all of her questions were answered. I let patient know that her orders would be sent to [facility] and they would be in touch with her regarding her sleep machine. I also let her know that after she had her machine at home and was using it we would call her for a 10 week follow up to see Dr.Turner. Patient verbalized understanding and thanked me for helping her.

## 2016-02-27 NOTE — Telephone Encounter (Signed)
Will call patient today.  Just received results from Dr. Radford Pax

## 2016-02-27 NOTE — Telephone Encounter (Signed)
I will forward to [facility] to follow up with patient.

## 2016-02-27 NOTE — Telephone Encounter (Signed)
Pt had sleep study done 02-15-16 and she never got a call regarding what to do after being diagnosed with Sleep Apnea-pls call [phone]

## 2016-03-12 ENCOUNTER — Telehealth: Payer: Self-pay | Admitting: Cardiology

## 2016-03-12 NOTE — Telephone Encounter (Signed)
error 

## 2016-03-14 ENCOUNTER — Telehealth: Payer: Self-pay | Admitting: Cardiology

## 2016-03-14 NOTE — Telephone Encounter (Signed)
Spoke with patient about follow up appt with Dr Aundra Dubin, pt scheduled to see Dr Aundra Dubin 05/30/16.

## 2016-03-14 NOTE — Telephone Encounter (Signed)
New MEssage  Pt requested to speak w/ rN- would not specify nature of phone call. Please call back and discuss.

## 2016-03-23 ENCOUNTER — Telehealth: Payer: Self-pay | Admitting: Cardiology

## 2016-03-23 NOTE — Telephone Encounter (Signed)
New Message:     Please call,pt says her blood pressure have never been this low before. It is 107/65 and pulse rate is 76.Please call to advise.

## 2016-03-23 NOTE — Telephone Encounter (Signed)
SPOKE WITH  PT  HAS  NOTED  EXTREME FATIGUE  FOR  1-2  WEEKS  ,CAN BARELY WALK. PT  IS CURRENTLY TAKING  MICARDIS  80/12.5 MG  IN AM AND  DILTIAZEM 120 MG EVERY  PM RECENTLY STARTED  TAKING MEDS SEPARATE  WITH NO CHANGE IN FATIGUE  AND  ALSO STARTED C PAP  3 WEEKS  AGO B/P TODAY WAS  107/65 AND  HEART RATE  76  ENCOURAGED  PT  TO KEEP  B/P LOG  AND  CALL NEXT  WEEK WITH MORE  READINGS WILL FORWARD TO  DR  [facility] FOR REVIEW .Adonis Housekeeper

## 2016-03-24 NOTE — Telephone Encounter (Signed)
Check BP daily.  Needs appointment with me or PA, bring BP readings to appt.

## 2016-03-26 NOTE — Telephone Encounter (Signed)
I offered pt an appt today. Pt states she prefers to check BP a few more days before coming in for appt. Pt advised she has been  scheduled to see Richardson Dopp, PA 03/29/16 at 11:45AM. Pt will continue to take and record BP daily, will bring BP log to appt with Scott.

## 2016-03-28 NOTE — Progress Notes (Signed)
Cardiology Office Note:    Date:  03/29/2016   ID:  Colleen White, DOB [birth year], MRN [id number]  PCP:  Scarlette Calico, MD  Cardiologist:  Dr. [name]   Electrophysiologist:  n/a  Referring MD: Janith Lima, MD   Chief Complaint  Patient presents with  . Fatigue    low BP    History of Present Illness:    Colleen White is a 70 y.o. female with a hx of HTN, hyperlipidemia, PAF, prior non-Hodgkins lymphoma.  She called the office in 1/17 to report increased episodes of palpitations.  She had had rare tachypalpitations for years, but in 1/17 she had 2 episodes of tachypalpitations close together.  An event monitor demonstrated AF with RVR.  She was started on Xarelto and Toprol XL.  Toprol XL caused swelling, so it was stopped and diltiazem CD was begun.  Last seen by Dr. [name] in 3/17. Sleep study in 6/17 was positive for OSA. She was placed on CPAP and has follow-up with Dr. Radford Pax pending.  She called in recently with complaints of fatigue and low blood pressure at 107/65. She was advised to monitor her blood pressure and follow-up today.  She had one day where her blood pressure was low. She was symptomatic with this. Her pressures have been optimal since then. She has felt well. She denies chest pain, shortness breath, orthopnea, PND or edema. She denies syncope or near-syncope. She has had some leg pain related to varicose veins. She denies any bleeding issues. She had lab work with her oncologist recently. Blood counts and renal function were both normal.  Prior CV studies that were reviewed today include:    Echo 3/17 Vigorous LVF, EF 65-70%, normal wall motion, grade 1 diastolic, mildly elevated LVOT velocity of 2.2 m/s-likely related to vigorous LVF  Event Monitor 2/17 1. Predominantly sinus.   2. Episodes of atrial fibrillation with RVR noted.   ETT-Echo 11/15 Normal study after maximal exercise   Past Medical History:  Diagnosis Date  . Allergic  rhinitis, cause unspecified   . Alopecia   . Anxiety   . Colon polyps   . Diverticulosis of colon (without mention of hemorrhage)   . Fibrocystic breast disease   . GERD (gastroesophageal reflux disease)   . Headache(784.0)   . Hemorrhoids   . History of echocardiogram    Echo 3/17:  Vigorous LVF, EF 65-70%, normal wall motion, grade 1 diastolic, mildly elevated LVOT velocity of 2.2 m/s-likely related to vigorous LVF  . History of stress test    Myoview normal in 2/05. //  Stress echo (11/15) with no evidence for ischemia or infarction.  [name] HLD (hyperlipidemia)   . HTN (hypertension)   . Mitral valve disorders   . Non Hodgkin's lymphoma ([place])    Treated at [facility] with surgery and XRT.  Did not have chemotherapy.   . OSA (obstructive sleep apnea) 03/29/2016  . Osteopenia   . PAF (paroxysmal atrial fibrillation) ([place])    a. CHADS2-VASc=3 //  b. Xarelto started in 2017  . Pulmonary nodule    Presumed benign after observation.   [name] Unspecified glaucoma     Past Surgical History:  Procedure Laterality Date  . ABDOMINAL HYSTERECTOMY  1996  . EYE SURGERY  2009   Cataract  . 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 Cloyd Stagers, [facility]  . s/p ORIF prox phalanx  02/2007   right little finger----Dr Fredna Dow  . s/p TAH/BSO  Current Medications: Outpatient Medications Prior to Visit  Medication Sig Dispense Refill  . brimonidine-timolol (COMBIGAN) 0.2-0.5 % ophthalmic solution Place 1 drop into both eyes every 12 (twelve) hours.      . cholecalciferol (VITAMIN D) 1000 UNITS tablet Take 1,000 Units by mouth daily.      [name] diltiazem (CARDIZEM CD) 120 MG 24 hr capsule Take 1 capsule (120 mg total) by mouth daily. 30 capsule 11  . docusate sodium (COLACE) 100 MG capsule Take once daily     . esomeprazole (NEXIUM) 40 MG capsule Take 1 capsule (40 mg total) by mouth daily. 30 minutes before the evening meal 30 capsule 6  . loratadine (CLARITIN) 10 MG tablet Take 10 mg by mouth  daily. As needed     . Minoxidil 5 % FOAM Apply 3-5 mLs topically at bedtime. Apply to Scalp for hair loss at bedtime     . mirtazapine (REMERON) 15 MG tablet Take 7.5 mg by mouth as needed (as need for itchy scalp).     . Multiple Vitamins-Minerals (CENTRUM) tablet Take 1 tablet by mouth daily.      . Multiple Vitamins-Minerals (PRESERVISION AREDS 2 PO) Take 1 tablet by mouth 2 (two) times daily.     . Omega-3 Fatty Acids (OMEGA 3 PO) Take 1 capsule by mouth daily.    . pravastatin (PRAVACHOL) 40 MG tablet TAKE 1 TABLET ONCE DAILY. 90 tablet 1  . Probiotic Product (PROBIOTIC DAILY PO) Take 1 tablet by mouth daily.    [name] telmisartan-hydrochlorothiazide (MICARDIS HCT) 80-12.5 MG tablet Take 1 tablet by mouth daily. 30 tablet 11  . tretinoin (RETIN-A) 0.05 % cream Once daily     . vitamin C (ASCORBIC ACID) 500 MG tablet Take 500 mg by mouth daily.    Colleen White 20 MG TABS tablet TAKE 1 TABLET DAILY WITH SUPPER. 30 tablet 10  . chlorpheniramine-HYDROcodone (TUSSIONEX PENNKINETIC ER) 10-8 MG/5ML SUER Take 5 mLs by mouth at bedtime as needed for cough. (Patient not taking: Reported on 03/29/2016) 140 mL 0  . traMADol (ULTRAM) 50 MG tablet TAKE (1) TABLET TWICE A DAY AS NEEDED. (Patient not taking: Reported on 03/29/2016) 60 tablet 3   No facility-administered medications prior to visit.       Allergies:   Review of patient's allergies indicates no known allergies.   Social History   Social History  . Marital status: Married    Spouse name: N/A  . Number of children: N/A  . Years of education: N/A   Social History Main Topics  . Smoking status: Former Smoker    Quit date: 08/27/1974  . Smokeless tobacco: Never Used  . Alcohol use No     Comment: occasionally  . Drug use: No  . Sexual activity: Yes   Other Topics Concern  . None   Social History Narrative  . None     Family History:  The patient's family history includes Cancer in her father and sister; Hypertension in her mother.    ROS:   Please see the history of present illness.    Review of Systems  Respiratory: Positive for cough.   Hematologic/Lymphatic: Bruises/bleeds easily.  Musculoskeletal: Positive for joint pain.   All other systems reviewed and are negative.   EKGs/Labs/Other Test Reviewed:    EKG:  EKG is   ordered today.  The ekg ordered today demonstrates NSR, HR 68, normal axis, QTC 435 ms, no significant change when compared to prior tracing  Recent Labs: 10/27/2015: BUN  14; Creat 0.70; Hemoglobin 12.5; Platelets 226; Potassium 3.8; Sodium 139 11/08/2015: TSH 2.67 12/21/2015: Magnesium 2.1   Recent Lipid Panel    Component Value Date/Time   CHOL 163 12/21/2015 0926   TRIG 122.0 12/21/2015 0926   HDL 46.00 12/21/2015 0926   CHOLHDL 4 12/21/2015 0926   VLDL 24.4 12/21/2015 0926   LDLCALC 93 12/21/2015 0926   LDLDIRECT 142.5 09/30/2012 1110     Physical Exam:    VS:  BP 110/60   Pulse 68   Ht 5\' 3"  (1.6 m)   Wt 166 lb 12.8 oz (75.7 kg)   BMI 29.55 kg/m     Wt Readings from Last 3 Encounters:  03/29/16 166 lb 12.8 oz (75.7 kg)  02/15/16 162 lb (73.5 kg)  12/21/15 162 lb (73.5 kg)     Physical Exam  Constitutional: She is oriented to person, place, and time. She appears well-developed and well-nourished.  HENT:  Head: Normocephalic and atraumatic.  Eyes: No scleral icterus.  Neck: Normal range of motion. No JVD present.  Cardiovascular: Normal rate, regular rhythm, S1 normal and S2 normal.  Exam reveals no gallop and no friction rub.   No murmur heard. Pulmonary/Chest: Breath sounds normal. She has no wheezes. She has no rhonchi. She has no rales.  Abdominal: Soft. There is no tenderness.  Musculoskeletal: She exhibits no edema.  Neurological: She is alert and oriented to person, place, and time.  Skin: Skin is warm and dry.  Psychiatric: She has a normal mood and affect.    ASSESSMENT:    1. Other fatigue   2. Paroxysmal atrial fibrillation (HCC)   3. Essential  hypertension   4. HYPERCHOLESTEROLEMIA   5. OSA (obstructive sleep apnea)    PLAN:    In order of problems listed above:  1. Fatigue - Likely related to transient hypotension.  I suspect her BP is better controlled now that she is treated for OSA.  Her BP has been optimal since last week. I suggest she remain on current medications.  She will continue to monitor her BP.  If it runs low or she has another episode of symptomatic hypotension, we can change her Micardis/HCTZ to Micardis 80 mg QD + HCTZ 12.5 mg QD prn edema.  2. PAF - First noted in 1/17.  CHADSVASC = 3.   -  Continue diltiazem CD 120 daily.   -  Continue Xarelto 20 daily.   3. HTN - BP optimal at this time. Continue current med Rx as noted.   4. Hyperlipidemia - Reasonable lipids in 4/16.   5. OSA - FU with Dr. Fransico Him and continue CPAP.  Letter provided today so that she can take her CPAP machine with her on the plane for an upcoming trip.    Medication Adjustments/Labs and Tests Ordered: Current medicines are reviewed at length with the patient today.  Concerns regarding medicines are outlined above.  Medication changes, Labs and Tests ordered today are outlined in the Patient Instructions noted below. Patient Instructions  Medication Instructions:  Your physician recommends that you continue on your current medications as directed. Please refer to the Current Medication list given to you today. Labwork: NONE Testing/Procedures: NONE Follow-Up: DR. Aundra Dubin 05/2016 Any Other Special Instructions Will Be Listed Below (If Applicable). If you need a refill on your cardiac medications before your next appointment, please call your pharmacy.  Signed, Richardson Dopp, PA-C  03/29/2016 12:37 PM    [facility]  [address], [place], [place]  [address] Phone: [phone]; Fax: [phone]

## 2016-03-29 ENCOUNTER — Encounter: Payer: Self-pay | Admitting: Physician Assistant

## 2016-03-29 ENCOUNTER — Ambulatory Visit (INDEPENDENT_AMBULATORY_CARE_PROVIDER_SITE_OTHER): Payer: Medicare Other | Admitting: Physician Assistant

## 2016-03-29 VITALS — BP 110/60 | HR 68 | Ht 63.0 in | Wt 166.8 lb

## 2016-03-29 DIAGNOSIS — R5383 Other fatigue: Secondary | ICD-10-CM | POA: Diagnosis not present

## 2016-03-29 DIAGNOSIS — I10 Essential (primary) hypertension: Secondary | ICD-10-CM | POA: Diagnosis not present

## 2016-03-29 DIAGNOSIS — E7800 Pure hypercholesterolemia, unspecified: Secondary | ICD-10-CM

## 2016-03-29 DIAGNOSIS — I480 Paroxysmal atrial fibrillation: Secondary | ICD-10-CM

## 2016-03-29 DIAGNOSIS — G4733 Obstructive sleep apnea (adult) (pediatric): Secondary | ICD-10-CM

## 2016-03-29 HISTORY — DX: Obstructive sleep apnea (adult) (pediatric): G47.33

## 2016-03-29 NOTE — Patient Instructions (Addendum)
Medication Instructions:  Your physician recommends that you continue on your current medications as directed. Please refer to the Current Medication list given to you today. Labwork: NONE Testing/Procedures: NONE Follow-Up: DR. Aundra Dubin 05/2016 Any Other Special Instructions Will Be Listed Below (If Applicable). If you need a refill on your cardiac medications before your next appointment, please call your pharmacy.

## 2016-04-02 ENCOUNTER — Emergency Department (HOSPITAL_COMMUNITY)
Admission: EM | Admit: 2016-04-02 | Discharge: 2016-04-02 | Disposition: A | Discharge status: Home / Self Care | Payer: Medicare Other | Attending: Emergency Medicine | Admitting: Emergency Medicine

## 2016-04-02 ENCOUNTER — Encounter (HOSPITAL_COMMUNITY): Payer: Self-pay | Admitting: Family Medicine

## 2016-04-02 ENCOUNTER — Emergency Department (HOSPITAL_COMMUNITY): Payer: Medicare Other

## 2016-04-02 ENCOUNTER — Ambulatory Visit (HOSPITAL_COMMUNITY)
Admission: EM | Admit: 2016-04-02 | Discharge: 2016-04-02 | Disposition: A | Discharge status: Nursing Home (Includes ICF) | Payer: Medicare Other | Attending: Emergency Medicine | Admitting: Emergency Medicine

## 2016-04-02 ENCOUNTER — Encounter (HOSPITAL_COMMUNITY): Payer: Self-pay | Admitting: Vascular Surgery

## 2016-04-02 DIAGNOSIS — S0990XA Unspecified injury of head, initial encounter: Secondary | ICD-10-CM | POA: Insufficient documentation

## 2016-04-02 DIAGNOSIS — Z87891 Personal history of nicotine dependence: Secondary | ICD-10-CM | POA: Insufficient documentation

## 2016-04-02 DIAGNOSIS — S3991XA Unspecified injury of abdomen, initial encounter: Secondary | ICD-10-CM | POA: Diagnosis not present

## 2016-04-02 DIAGNOSIS — S199XXA Unspecified injury of neck, initial encounter: Secondary | ICD-10-CM | POA: Diagnosis not present

## 2016-04-02 DIAGNOSIS — S2231XA Fracture of one rib, right side, initial encounter for closed fracture: Secondary | ICD-10-CM | POA: Diagnosis not present

## 2016-04-02 DIAGNOSIS — R079 Chest pain, unspecified: Secondary | ICD-10-CM | POA: Diagnosis not present

## 2016-04-02 DIAGNOSIS — S20211A Contusion of right front wall of thorax, initial encounter: Secondary | ICD-10-CM

## 2016-04-02 DIAGNOSIS — Y929 Unspecified place or not applicable: Secondary | ICD-10-CM | POA: Insufficient documentation

## 2016-04-02 DIAGNOSIS — R51 Headache: Secondary | ICD-10-CM | POA: Diagnosis not present

## 2016-04-02 DIAGNOSIS — R0781 Pleurodynia: Secondary | ICD-10-CM | POA: Diagnosis not present

## 2016-04-02 DIAGNOSIS — Y939 Activity, unspecified: Secondary | ICD-10-CM | POA: Diagnosis not present

## 2016-04-02 DIAGNOSIS — Z7901 Long term (current) use of anticoagulants: Secondary | ICD-10-CM

## 2016-04-02 DIAGNOSIS — W1839XA Other fall on same level, initial encounter: Secondary | ICD-10-CM

## 2016-04-02 DIAGNOSIS — Y999 Unspecified external cause status: Secondary | ICD-10-CM | POA: Diagnosis not present

## 2016-04-02 DIAGNOSIS — W01198A Fall on same level from slipping, tripping and stumbling with subsequent striking against other object, initial encounter: Secondary | ICD-10-CM | POA: Diagnosis not present

## 2016-04-02 DIAGNOSIS — I10 Essential (primary) hypertension: Secondary | ICD-10-CM | POA: Insufficient documentation

## 2016-04-02 DIAGNOSIS — S299XXA Unspecified injury of thorax, initial encounter: Secondary | ICD-10-CM | POA: Diagnosis not present

## 2016-04-02 DIAGNOSIS — M542 Cervicalgia: Secondary | ICD-10-CM | POA: Diagnosis not present

## 2016-04-02 DIAGNOSIS — Z79899 Other long term (current) drug therapy: Secondary | ICD-10-CM | POA: Insufficient documentation

## 2016-04-02 DIAGNOSIS — W010XXA Fall on same level from slipping, tripping and stumbling without subsequent striking against object, initial encounter: Secondary | ICD-10-CM

## 2016-04-02 DIAGNOSIS — G44319 Acute post-traumatic headache, not intractable: Secondary | ICD-10-CM

## 2016-04-02 DIAGNOSIS — W19XXXA Unspecified fall, initial encounter: Secondary | ICD-10-CM

## 2016-04-02 DIAGNOSIS — R109 Unspecified abdominal pain: Secondary | ICD-10-CM | POA: Diagnosis not present

## 2016-04-02 HISTORY — DX: Essential (primary) hypertension: I10

## 2016-04-02 LAB — I-STAT CHEM 8, ED
BUN: 10 mg/dL (ref 6–20)
Calcium, Ion: 1.2 mmol/L (ref 1.12–1.23)
Chloride: 100 mmol/L — ABNORMAL LOW (ref 101–111)
Creatinine, Ser: 0.7 mg/dL (ref 0.44–1.00)
Glucose, Bld: 158 mg/dL — ABNORMAL HIGH (ref 65–99)
HCT: 40 % (ref 36.0–46.0)
Hemoglobin: 13.6 g/dL (ref 12.0–15.0)
Potassium: 3.4 mmol/L — ABNORMAL LOW (ref 3.5–5.1)
Sodium: 142 mmol/L (ref 135–145)
TCO2: 28 mmol/L (ref 0–100)

## 2016-04-02 LAB — CBC WITH DIFFERENTIAL/PLATELET
Basophils Absolute: 0 10*3/uL (ref 0.0–0.1)
Basophils Relative: 1 %
Eosinophils Absolute: 0.1 10*3/uL (ref 0.0–0.7)
Eosinophils Relative: 2 %
HCT: 39.1 % (ref 36.0–46.0)
Hemoglobin: 13.1 g/dL (ref 12.0–15.0)
Lymphocytes Relative: 22 %
Lymphs Abs: 1.5 10*3/uL (ref 0.7–4.0)
MCH: 30.6 pg (ref 26.0–34.0)
MCHC: 33.5 g/dL (ref 30.0–36.0)
MCV: 91.4 fL (ref 78.0–100.0)
Monocytes Absolute: 0.6 10*3/uL (ref 0.1–1.0)
Monocytes Relative: 9 %
Neutro Abs: 4.4 10*3/uL (ref 1.7–7.7)
Neutrophils Relative %: 66 %
Platelets: 204 10*3/uL (ref 150–400)
RBC: 4.28 MIL/uL (ref 3.87–5.11)
RDW: 12.6 % (ref 11.5–15.5)
WBC: 6.6 10*3/uL (ref 4.0–10.5)

## 2016-04-02 MED ORDER — IOPAMIDOL (ISOVUE-300) INJECTION 61%
INTRAVENOUS | Status: AC
  Administered 2016-04-02: 100 mL
  Filled 2016-04-02: qty 100

## 2016-04-02 MED ORDER — HYDROCODONE-ACETAMINOPHEN 5-325 MG PO TABS: 1.0000 | ORAL_TABLET | Freq: Four times a day (QID) | ORAL | 0 refills | Status: DC | PRN

## 2016-04-02 MED ORDER — FENTANYL CITRATE (PF) 100 MCG/2ML IJ SOLN
25.0000 ug | Freq: Once | INTRAMUSCULAR | Status: AC
  Administered 2016-04-02: 25 ug via INTRAVENOUS
  Filled 2016-04-02: qty 2

## 2016-04-02 NOTE — Discharge Instructions (Signed)
Your CT scan shows no evidence of fracture or internal injury.  It does show a small lung nodule, which will need to be reassessed in 6-12 months by your primary care doctor.  Please use the incentive spirometer for the next week.  Please take your pain medications as directed.    There is a small chance that a head injury may have delayed bleeding.  If you develop a worsening headache, numbness, weakness, or tingling, please dial 911.

## 2016-04-02 NOTE — ED Notes (Signed)
Brought patient back to room with family in tow; patient undressed, in gown, on continuous pulse oximetry and blood pressure cuff; visitor at bedside 

## 2016-04-02 NOTE — ED Provider Notes (Signed)
CSN: [id number]     Arrival date & time 04/02/16  1300 History   First MD Initiated Contact with Patient 04/02/16 1341     Chief Complaint  Patient presents with  . Head Injury   (Consider location/radiation/quality/duration/timing/severity/associated sxs/prior Treatment) 70 year old female states she was at home yesterday afternoon watering plants when she tripped and fell. As she was beginning to fall she struck the top of her head on a bird feeder producing a small superficial skin scrape. She fell against a cement structure striking the right anterolateral chest wall. She states that she had some headache last p.m. and felt fairly well this morning. During the day she has developed a crescendo headache described now as severe. She is also complaining of pain to the right anterolateral chest wall exacerbated by movement and palpation. Denies pain to the neck or back. Denies pain to the extremities. Denies problems with vision, speech, hearing, swallowing, focal paresthesias or weakness, problems with recall, memory, orientation or confusion.      Past Medical History:  Diagnosis Date  . Allergic rhinitis, cause unspecified   . Alopecia   . Anxiety   . Colon polyps   . Diverticulosis of colon (without mention of hemorrhage)   . Fibrocystic breast disease   . GERD (gastroesophageal reflux disease)   . Headache(784.0)   . Hemorrhoids   . History of echocardiogram    Echo 3/17:  Vigorous LVF, EF 65-70%, normal wall motion, grade 1 diastolic, mildly elevated LVOT velocity of 2.2 m/s-likely related to vigorous LVF  . History of stress test    Myoview normal in 2/05. //  Stress echo (11/15) with no evidence for ischemia or infarction.  [name] HLD (hyperlipidemia)   . HTN (hypertension)   . Mitral valve disorders   . Non Hodgkin's lymphoma ([place])    Treated at [facility] with surgery and XRT.  Did not have chemotherapy.   . OSA (obstructive sleep apnea) 03/29/2016  . Osteopenia   . PAF (paroxysmal  atrial fibrillation) ([place])    a. CHADS2-VASc=3 //  b. Xarelto started in 2017  . Pulmonary nodule    Presumed benign after observation.   [name] Unspecified glaucoma    Past Surgical History:  Procedure Laterality Date  . ABDOMINAL HYSTERECTOMY  1996  . EYE SURGERY  2009   Cataract  . 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 Cloyd Stagers, [facility]  . s/p ORIF prox phalanx  02/2007   right little finger----Dr Fredna Dow  . s/p TAH/BSO     Family History  Problem Relation Age of Onset  . Hypertension Mother   . Cancer Father     throat  . Cancer Sister     breast (sister)   Social History  Substance Use Topics  . Smoking status: Former Smoker    Quit date: 08/27/1974  . Smokeless tobacco: Never Used  . Alcohol use No     Comment: occasionally   OB History    No data available     Review of Systems  Constitutional: Negative for activity change, chills, diaphoresis and fever.  HENT: Negative for ear pain, hearing loss, nosebleeds, sore throat and tinnitus.        Small scalp abrasion at the vertex.  Eyes: Negative.        See also HPI  Respiratory: Negative for cough and shortness of breath.   Cardiovascular: Negative for chest pain, palpitations and leg swelling.  Gastrointestinal: Negative for abdominal pain and blood in  stool.  Genitourinary: Negative.   Musculoskeletal: Negative for back pain, gait problem, joint swelling, neck pain and neck stiffness.  Skin: Positive for wound. Negative for rash.  Neurological: Positive for headaches. Negative for dizziness, tremors, syncope, speech difficulty and light-headedness.       See also HPI  Psychiatric/Behavioral: Negative.   All other systems reviewed and are negative.   Allergies  Review of patient's allergies indicates no known allergies.  Home Medications   Prior to Admission medications   Medication Sig Start Date End Date Taking? Authorizing Provider  brimonidine-timolol (COMBIGAN) 0.2-0.5 %  ophthalmic solution Place 1 drop into both eyes every 12 (twelve) hours.      Historical Provider, MD  calcium citrate-vitamin D (CITRACAL+D) 315-200 MG-UNIT tablet Take 1 tablet by mouth 2 (two) times daily.     Historical Provider, MD  cholecalciferol (VITAMIN D) 1000 UNITS tablet Take 1,000 Units by mouth daily.      Historical Provider, MD  diltiazem (CARDIZEM CD) 120 MG 24 hr capsule Take 1 capsule (120 mg total) by mouth daily. 10/31/15   Larey Dresser, MD  docusate sodium (COLACE) 100 MG capsule Take once daily     Historical Provider, MD  esomeprazole (NEXIUM) 40 MG capsule Take 1 capsule (40 mg total) by mouth daily. 30 minutes before the evening meal 11/06/13   Noralee Space, MD  loratadine (CLARITIN) 10 MG tablet Take 10 mg by mouth daily. As needed     Historical Provider, MD  Minoxidil 5 % FOAM Apply 3-5 mLs topically at bedtime. Apply to Scalp for hair loss at bedtime     Historical Provider, MD  mirtazapine (REMERON) 15 MG tablet Take 7.5 mg by mouth as needed (as need for itchy scalp).     Historical Provider, MD  Multiple Vitamins-Minerals (CENTRUM) tablet Take 1 tablet by mouth daily.      Historical Provider, MD  Multiple Vitamins-Minerals (PRESERVISION AREDS 2 PO) Take 1 tablet by mouth 2 (two) times daily.     Historical Provider, MD  Omega-3 Fatty Acids (OMEGA 3 PO) Take 1 capsule by mouth daily.    Historical Provider, MD  pravastatin (PRAVACHOL) 40 MG tablet TAKE 1 TABLET ONCE DAILY. 12/23/15   Janith Lima, MD  Probiotic Product (PROBIOTIC DAILY PO) Take 1 tablet by mouth daily.    Historical Provider, MD  telmisartan-hydrochlorothiazide (MICARDIS HCT) 80-12.5 MG tablet Take 1 tablet by mouth daily. 07/28/15   Janith Lima, MD  tretinoin (RETIN-A) 0.05 % cream Once daily     Historical Provider, MD  vitamin C (ASCORBIC ACID) 500 MG tablet Take 500 mg by mouth daily.    Historical Provider, MD  XARELTO 20 MG TABS tablet TAKE 1 TABLET DAILY WITH SUPPER. 11/24/15   Larey Dresser, MD   Meds Ordered and Administered this Visit  Medications - No data to display  BP 150/80   Pulse 78   Temp 97.9 F (36.6 C)   Resp 18   SpO2 97%  No data found.   Physical Exam  Constitutional: She is oriented to person, place, and time. She appears well-developed and well-nourished. No distress.  HENT:  Head: Normocephalic and atraumatic.  Right Ear: External ear normal.  Left Ear: External ear normal.  Nose: Nose normal.  Mouth/Throat: Oropharynx is clear and moist. No oropharyngeal exudate.  Eyes: Conjunctivae and EOM are normal. Pupils are equal, round, and reactive to light. Right eye exhibits no discharge. Left eye exhibits no discharge.  Neck: Normal range of motion. Neck supple.  Cardiovascular: Normal rate, regular rhythm, normal heart sounds and intact distal pulses.   Pulmonary/Chest: Effort normal and breath sounds normal. No respiratory distress. She has no wheezes. She has no rales.  Positive for tenderness along the right lateral chest wall at the mid and anterior axillary line extending into the right lateral breast.  Abdominal: Soft. Bowel sounds are normal. She exhibits no mass. There is no rebound.  Musculoskeletal: Normal range of motion. She exhibits no edema or tenderness.  Lymphadenopathy:    She has no cervical adenopathy.  Neurological: She is alert and oriented to person, place, and time. She has normal strength. She displays no tremor. No cranial nerve deficit or sensory deficit. She exhibits normal muscle tone. Coordination and gait normal. GCS eye subscore is 4. GCS verbal subscore is 5. GCS motor subscore is 6.  Speech is lucid, no dysarthria. Memory recall normal.  Skin: Skin is warm and dry. No rash noted.  Psychiatric: She has a normal mood and affect.  Nursing note and vitals reviewed.   Urgent Care Course   Clinical Course    Procedures (including critical care time)  Labs Review Labs Reviewed - No data to display  Imaging  Review No results found.   Visual Acuity Review  Right Eye Distance:   Left Eye Distance:   Bilateral Distance:    Right Eye Near:   Left Eye Near:    Bilateral Near:         MDM   1. Fall from slip, trip, or stumble, initial encounter   2. Acute post-traumatic headache, not intractable   3. Head injury, initial encounter   4. Chest wall contusion, right, initial encounter   5. Rib pain on right side   6. Current use of long term anticoagulation    Transfer to [place] via shuttle for evaluation of headache described as severe as well as right chest wall pain status post fall yesterday. Patient is on an anticoagulant. Fully awake alert and oriented. No signs or symptoms of neck injury. No back pain or tenderness. Ambulatory and showing no signs of distress.    Janne Napoleon, NP 04/02/16 1406

## 2016-04-02 NOTE — ED Triage Notes (Signed)
Pt here for right rib/flank pain and headache after a fall yesterday . sts she struck head and right side on pavers. Denies LOC. sts 10/10 headache and she didn't take her xarelto last night. Neuro intact.

## 2016-04-02 NOTE — ED Provider Notes (Signed)
[name] DEPT Provider Note   CSN: [id number] Arrival date & time: 04/02/16  1442  First Provider Contact:  First MD Initiated Contact with Patient 04/02/16 1756        History   Chief Complaint Chief Complaint  Patient presents with  . Rib Injury    HPI Colleen White is a 70 y.o. female.  Patient presents to the emergency department with chief complaint of fall last night. She states that she tripped and fell striking her right ribs and side on a concrete planter box. She also struck her head. She is anticoagulated with Xarelto. She complains of headache, rib pain, and right sided abdominal and back pain. She denies any numbness, weakness, or tingling in her extremities. Her symptoms have been constant and moderate in severity. She has not taken anything for her symptoms. She was seen initially at urgent care, and sent to the emergency department for further evaluation with advanced imaging.   The history is provided by the patient. No language interpreter was used.    Past Medical History:  Diagnosis Date  . Allergic rhinitis, cause unspecified   . Alopecia   . Anxiety   . Colon polyps   . Diverticulosis of colon (without mention of hemorrhage)   . Fibrocystic breast disease   . GERD (gastroesophageal reflux disease)   . Headache(784.0)   . Hemorrhoids   . History of echocardiogram    Echo 3/17:  Vigorous LVF, EF 65-70%, normal wall motion, grade 1 diastolic, mildly elevated LVOT velocity of 2.2 m/s-likely related to vigorous LVF  . History of stress test    Myoview normal in 2/05. //  Stress echo (11/15) with no evidence for ischemia or infarction.  [name] HLD (hyperlipidemia)   . HTN (hypertension)   . Mitral valve disorders   . Non Hodgkin's lymphoma ([place])    Treated at [facility] with surgery and XRT.  Did not have chemotherapy.   . OSA (obstructive sleep apnea) 03/29/2016  . Osteopenia   . PAF (paroxysmal atrial fibrillation) ([place])    a. CHADS2-VASc=3 //  b. Xarelto  started in 2017  . Pulmonary nodule    Presumed benign after observation.   [name] Unspecified glaucoma     Patient Active Problem List   Diagnosis Date Noted  . OSA (obstructive sleep apnea) 03/29/2016  . Paroxysmal atrial fibrillation ([place]) 11/08/2015  . 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 Excessive daytime sleepiness 04/12/2014  . Daytime somnolence 04/12/2014  . Routine general medical examination at a health care facility 11/30/2013  . Other abnormal and inconclusive findings on diagnostic imaging of breast 06/18/2013  . 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 [facility]) 10/20/2010  . GLAUCOMA 08/04/2008  . ALLERGIC RHINITIS 12/17/2007  . Disorder of bone and cartilage 12/17/2007  . Disease of lung 12/17/2007  . Bone/cartilage disorder 12/17/2007  . HYPERCHOLESTEROLEMIA 07/31/2007  . Essential hypertension 07/31/2007  . MITRAL VALVE PROLAPSE 07/31/2007  . GERD 07/31/2007    Past Surgical History:  Procedure Laterality Date  . ABDOMINAL HYSTERECTOMY  1996  . EYE SURGERY  2009   Cataract  . 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 Cloyd Stagers, [facility]  . s/p ORIF prox phalanx  02/2007   right little finger----Dr Fredna Dow  . s/p TAH/BSO      OB History    No data available       Home Medications    Prior to Admission medications   Medication Sig  Start Date End Date Taking? Authorizing Provider  brimonidine-timolol (COMBIGAN) 0.2-0.5 % ophthalmic solution Place 1 drop into both eyes every 12 (twelve) hours.     Yes Historical Provider, MD  Calcium Carbonate-Vitamin D (CALCIUM 500 + D PO) Take 1 tablet by mouth 2 (two) times daily.   Yes Historical Provider, MD  calcium citrate-vitamin D (CITRACAL+D) 315-200 MG-UNIT tablet Take 1 tablet by mouth 2 (two) times daily.    Yes Historical Provider, MD  cholecalciferol (VITAMIN D) 1000 UNITS tablet Take 1,000 Units by mouth daily.     Yes Historical Provider, MD  diltiazem  (CARDIZEM CD) 120 MG 24 hr capsule Take 1 capsule (120 mg total) by mouth daily. 10/31/15  Yes Larey Dresser, MD  docusate sodium (COLACE) 100 MG capsule Take 100 mg by mouth every evening. Take once daily    Yes Historical Provider, MD  esomeprazole (NEXIUM) 40 MG capsule Take 1 capsule (40 mg total) by mouth daily. 30 minutes before the evening meal 11/06/13  Yes Noralee Space, MD  loratadine (CLARITIN) 10 MG tablet Take 10 mg by mouth daily as needed for allergies. As needed    Yes Historical Provider, MD  Minoxidil 5 % FOAM Apply 3-5 mLs topically at bedtime. Apply to Scalp for hair loss at bedtime    Yes Historical Provider, MD  mirtazapine (REMERON) 7.5 MG tablet Take 2.5 mg by mouth at bedtime as needed (for scalp itching).   Yes Historical Provider, MD  Multiple Vitamins-Minerals (CENTRUM) tablet Take 1 tablet by mouth daily.     Yes Historical Provider, MD  Multiple Vitamins-Minerals (PRESERVISION AREDS 2 PO) Take 1 tablet by mouth 2 (two) times daily.    Yes Historical Provider, MD  Omega-3 Fatty Acids (OMEGA 3 PO) Take 1 capsule by mouth daily.   Yes Historical Provider, MD  pravastatin (PRAVACHOL) 40 MG tablet TAKE 1 TABLET ONCE DAILY. 12/23/15  Yes Janith Lima, MD  Probiotic Product (PROBIOTIC DAILY PO) Take 1 tablet by mouth daily.   Yes Historical Provider, MD  telmisartan-hydrochlorothiazide (MICARDIS HCT) 80-12.5 MG tablet Take 1 tablet by mouth daily. 07/28/15  Yes Janith Lima, MD  tretinoin (RETIN-A) 0.05 % cream Once daily    Yes Historical Provider, MD  vitamin C (ASCORBIC ACID) 500 MG tablet Take 500 mg by mouth daily.   Yes Historical Provider, MD  XARELTO 20 MG TABS tablet TAKE 1 TABLET DAILY WITH SUPPER. 11/24/15  Yes Larey Dresser, MD    Family History Family History  Problem Relation Age of Onset  . Hypertension Mother   . Cancer Father     throat  . Cancer Sister     breast (sister)    Social History Social History  Substance Use Topics  . Smoking status:  Former Smoker    Quit date: 08/27/1974  . Smokeless tobacco: Never Used  . Alcohol use No     Comment: occasionally     Allergies   Review of patient's allergies indicates no known allergies.   Review of Systems Review of Systems  Cardiovascular: Positive for chest pain.  Gastrointestinal: Positive for abdominal pain.  All other systems reviewed and are negative.    Physical Exam Updated Vital Signs BP 126/82   Pulse (!) 57   Temp 97.8 F (36.6 C) (Oral)   Resp 15   Ht 5\' 3"  (1.6 m)   Wt 74.8 kg   SpO2 98%   BMI 29.23 kg/m   Physical Exam  Constitutional:  She is oriented to person, place, and time. She appears well-developed and well-nourished.  HENT:  Head: Normocephalic and atraumatic.  Eyes: Conjunctivae and EOM are normal. Pupils are equal, round, and reactive to light.  Neck: Normal range of motion. Neck supple.  Cardiovascular: Normal rate and regular rhythm.  Exam reveals no gallop and no friction rub.   No murmur heard. Pulmonary/Chest: Effort normal and breath sounds normal. No respiratory distress. She has no wheezes. She has no rales. She exhibits no tenderness.  Abdominal: Soft. Bowel sounds are normal. She exhibits no distension and no mass. There is tenderness. There is no rebound and no guarding.  Right upper quadrant tender to palpation  Musculoskeletal: Normal range of motion. She exhibits no edema or tenderness.  Neurological: She is alert and oriented to person, place, and time.  Skin: Skin is warm and dry.  Psychiatric: She has a normal mood and affect. Her behavior is normal. Judgment and thought content normal.  Nursing note and vitals reviewed.    ED Treatments / Results  Labs (all labs ordered are listed, but only abnormal results are displayed) Labs Reviewed  I-STAT CHEM 8, ED    EKG  EKG Interpretation None       Radiology Dg Ribs Unilateral W/chest Right  Result Date: 04/02/2016 CLINICAL DATA:  Injury to the wrist and hips  today. Patient is on blood thinner. Fell on a concrete planter with her right ribs. Pain below diaphragm anterior ribs. Three areas marked. Heard a snap the. EXAM: RIGHT RIBS AND CHEST - 3+ VIEW COMPARISON:  12/21/2015. FINDINGS: Heart size is normal. Lungs are clear. No pneumothorax. Multiple oblique views are performed, showing a nondisplaced fracture of the right lateral sixth rib. IMPRESSION: 1.  No evidence for acute cardiopulmonary abnormality. 2. Fracture of the right lateral sixth rib. Electronically Signed   By: Nolon Nations M.D.   On: 04/02/2016 16:38   Ct Head Wo Contrast  Result Date: 04/02/2016 CLINICAL DATA:  Hit head on bird feeder now with pain involving the right side of the head. EXAM: CT HEAD WITHOUT CONTRAST CT CERVICAL SPINE WITHOUT CONTRAST TECHNIQUE: Multidetector CT imaging of the head and cervical spine was performed following the standard protocol without intravenous contrast. Multiplanar CT image reconstructions of the cervical spine were also generated. COMPARISON:  Head CT- 07/24/2010 FINDINGS: CT HEAD FINDINGS Regional soft tissues appear normal.  No radiopaque foreign body. No displaced calvarial fracture. Note is made of mild hyperostosis frontalis. Mild atrophy with sulcal prominence and prominence of the bifrontal extra-axial spaces. The gray-white differentiation is maintained without CT evidence of acute large territory infarct. No intraparenchymal or extra-axial mass or hemorrhage. Normal size and configuration of the ventricles and the basilar cisterns. No midline shift. Small air-fluid level seen within the right sphenoid sinus. The remaining paranasal sinuses and mastoid air cells are normally aerated. CT CERVICAL SPINE FINDINGS C1 to the superior endplate of T1 is imaged. Normal alignment of the cervical spine. No anterolisthesis or retrolisthesis. The dens is normally positioned between the lateral masses of C1. Mild degenerative change involving the atlantodental  articulation. Note is made of a approximately 1.5 x 0.5 x 0.9 cm ossicle adjacent to the cranial aspect of the anterior arch of C1, likely the sequela of prior avulsive injury. Normal atlantoaxial articulations. No fracture or static subluxation of the cervical spine. Cervical vertebral body heights are preserved. Prevertebral soft tissues are normal. Mild multilevel cervical spine DDD, worse at C5-C6 and C6-C7 with disc space  height loss, endplate irregularity and sclerosis. No bulky cervical lymphadenopathy on this noncontrast examination. Normal noncontrast appearance of the thyroid gland. IMPRESSION: 1. Mild atrophy without acute intracranial process. 2. No fracture or static subluxation of the cervical spine. 3. Mild multilevel cervical spine DDD. 4. Small air-fluid level within the right sphenoid sinus, nonspecific though could be indicative of acute sinusitis. Clinical correlation is advised. Electronically Signed   By: Sandi Mariscal M.D.   On: 04/02/2016 16:13   Ct Cervical Spine Wo Contrast  Result Date: 04/02/2016 CLINICAL DATA:  Hit head on bird feeder now with pain involving the right side of the head. EXAM: CT HEAD WITHOUT CONTRAST CT CERVICAL SPINE WITHOUT CONTRAST TECHNIQUE: Multidetector CT imaging of the head and cervical spine was performed following the standard protocol without intravenous contrast. Multiplanar CT image reconstructions of the cervical spine were also generated. COMPARISON:  Head CT- 07/24/2010 FINDINGS: CT HEAD FINDINGS Regional soft tissues appear normal.  No radiopaque foreign body. No displaced calvarial fracture. Note is made of mild hyperostosis frontalis. Mild atrophy with sulcal prominence and prominence of the bifrontal extra-axial spaces. The gray-white differentiation is maintained without CT evidence of acute large territory infarct. No intraparenchymal or extra-axial mass or hemorrhage. Normal size and configuration of the ventricles and the basilar cisterns. No  midline shift. Small air-fluid level seen within the right sphenoid sinus. The remaining paranasal sinuses and mastoid air cells are normally aerated. CT CERVICAL SPINE FINDINGS C1 to the superior endplate of T1 is imaged. Normal alignment of the cervical spine. No anterolisthesis or retrolisthesis. The dens is normally positioned between the lateral masses of C1. Mild degenerative change involving the atlantodental articulation. Note is made of a approximately 1.5 x 0.5 x 0.9 cm ossicle adjacent to the cranial aspect of the anterior arch of C1, likely the sequela of prior avulsive injury. Normal atlantoaxial articulations. No fracture or static subluxation of the cervical spine. Cervical vertebral body heights are preserved. Prevertebral soft tissues are normal. Mild multilevel cervical spine DDD, worse at C5-C6 and C6-C7 with disc space height loss, endplate irregularity and sclerosis. No bulky cervical lymphadenopathy on this noncontrast examination. Normal noncontrast appearance of the thyroid gland. IMPRESSION: 1. Mild atrophy without acute intracranial process. 2. No fracture or static subluxation of the cervical spine. 3. Mild multilevel cervical spine DDD. 4. Small air-fluid level within the right sphenoid sinus, nonspecific though could be indicative of acute sinusitis. Clinical correlation is advised. Electronically Signed   By: Sandi Mariscal M.D.   On: 04/02/2016 16:13    Procedures Procedures (including critical care time)  Medications Ordered in ED Medications  fentaNYL (SUBLIMAZE) injection 25 mcg (not administered)     Initial Impression / Assessment and Plan / ED Course  I have reviewed the triage vital signs and the nursing notes.  Pertinent labs & imaging results that were available during my care of the patient were reviewed by me and considered in my medical decision making (see chart for details).  Clinical Course   Patient with chief complaint of mechanical fall from last  night. She struck her head and right ribs. She is having significant right upper quadrant and right chest wall pain. There is no laceration, there is no contusion. Plain films of ribs shows questionable fracture, CT of head and neck are negative acute findings. Given the amount of pain the patient is having the right upper quadrant, feel that trauma scan is indicated to rule out liver injury. Hemoglobin is normal at 13.1.  Trauma scans are negative, and do not show evidence of the previously seen rib fracture. The patient will be given an incentive spirometer. She'll be discharged home with some Vicodin. Patient seen by and discussed with Dr. Regenia Skeeter.  Patient is encouraged to follow-up with her primary care doctor regarding the nodule seen on the CT scan today in 12 months time. She is also given instructions to return to the emergency department if she has any worsening symptoms.  Final Clinical Impressions(s) / ED Diagnoses   Final diagnoses:  Rib pain on right side  Fall, initial encounter  Head injury, initial encounter    New Prescriptions New Prescriptions   HYDROCODONE-ACETAMINOPHEN (NORCO/VICODIN) 5-325 MG TABLET    Take 1-2 tablets by mouth every 6 (six) hours as needed.     Montine Circle, PA-C 04/02/16 [id number]    Sherwood Gambler, MD 04/04/16 [phone]

## 2016-04-02 NOTE — ED Triage Notes (Signed)
Pt reports to the ED for eval of HA and right ribcage pain. She reports yesterday she hit her head on a concrete structure. Denies any LOC. She is on Xeralto. Pt also fell and struck her right ribcage on a planter. Complaining of HA and ribcage pain. Denies any SOB. Pt A&Ox4, resp e/u, and skin warm and dry.

## 2016-04-09 DIAGNOSIS — Z961 Presence of intraocular lens: Secondary | ICD-10-CM | POA: Diagnosis not present

## 2016-04-09 DIAGNOSIS — H401122 Primary open-angle glaucoma, left eye, moderate stage: Secondary | ICD-10-CM | POA: Diagnosis not present

## 2016-04-09 DIAGNOSIS — H5340 Unspecified visual field defects: Secondary | ICD-10-CM | POA: Diagnosis not present

## 2016-04-09 DIAGNOSIS — H401112 Primary open-angle glaucoma, right eye, moderate stage: Secondary | ICD-10-CM | POA: Diagnosis not present

## 2016-05-01 ENCOUNTER — Ambulatory Visit (INDEPENDENT_AMBULATORY_CARE_PROVIDER_SITE_OTHER): Payer: Medicare Other

## 2016-05-01 DIAGNOSIS — Z5181 Encounter for therapeutic drug level monitoring: Secondary | ICD-10-CM

## 2016-05-01 DIAGNOSIS — I4891 Unspecified atrial fibrillation: Secondary | ICD-10-CM

## 2016-05-01 DIAGNOSIS — M545 Low back pain: Secondary | ICD-10-CM | POA: Diagnosis not present

## 2016-05-01 NOTE — Patient Instructions (Signed)

## 2016-05-01 NOTE — Progress Notes (Signed)
Pt was started on Xarelto 20mg  daily for afib on 09/29/15 by Dr Aundra Dubin.    Reviewed patients medication list.  Pt is not currently on any combined P-gp and strong CYP3A4 inhibitors/inducers (ketoconazole, traconazole, ritonavir, carbamazepine, phenytoin, rifampin, St. John's wort).  Reviewed labs.  SCr 0.70, Weight 75.7kg, CrCl- 88.09.  Dose appropriate based on CrCl.   Hgb and HCT Within Normal Limits 13.1/39.1 on 04/02/16.  Pt advised to continue on same dosage of Xarelto 20mg  daily, pt instructed to take with largest meal of the day.  Lab appointment scheduled in 6 months for repeat CBC and BMP results to Dr Aundra Dubin.  [name]

## 2016-05-02 DIAGNOSIS — L814 Other melanin hyperpigmentation: Secondary | ICD-10-CM | POA: Diagnosis not present

## 2016-05-02 DIAGNOSIS — I83811 Varicose veins of right lower extremities with pain: Secondary | ICD-10-CM | POA: Diagnosis not present

## 2016-05-02 DIAGNOSIS — L821 Other seborrheic keratosis: Secondary | ICD-10-CM | POA: Diagnosis not present

## 2016-05-02 DIAGNOSIS — L658 Other specified nonscarring hair loss: Secondary | ICD-10-CM | POA: Diagnosis not present

## 2016-05-02 DIAGNOSIS — Z8572 Personal history of non-Hodgkin lymphomas: Secondary | ICD-10-CM | POA: Diagnosis not present

## 2016-05-02 DIAGNOSIS — Z808 Family history of malignant neoplasm of other organs or systems: Secondary | ICD-10-CM | POA: Diagnosis not present

## 2016-05-15 ENCOUNTER — Encounter: Payer: Self-pay | Admitting: Cardiology

## 2016-05-27 ENCOUNTER — Encounter: Payer: Self-pay | Admitting: Cardiology

## 2016-05-28 ENCOUNTER — Other Ambulatory Visit: Payer: Self-pay | Admitting: Internal Medicine

## 2016-05-28 ENCOUNTER — Encounter: Payer: Self-pay | Admitting: Cardiology

## 2016-05-28 ENCOUNTER — Ambulatory Visit (INDEPENDENT_AMBULATORY_CARE_PROVIDER_SITE_OTHER): Payer: Medicare Other | Admitting: Cardiology

## 2016-05-28 VITALS — BP 126/80 | HR 70 | Ht 63.0 in | Wt 169.8 lb

## 2016-05-28 DIAGNOSIS — I10 Essential (primary) hypertension: Secondary | ICD-10-CM | POA: Diagnosis not present

## 2016-05-28 DIAGNOSIS — G4733 Obstructive sleep apnea (adult) (pediatric): Secondary | ICD-10-CM | POA: Diagnosis not present

## 2016-05-28 NOTE — Progress Notes (Signed)
Cardiology Office Note    Date:  05/28/2016   ID:  Colleen White, DOB 01-Nov-1945, MRN [id number]  PCP:  Colleen Calico, MD  Cardiologist:  Fransico Him, MD   Chief Complaint  Patient presents with  . Sleep Apnea  . Hypertension    History of Present Illness:  Colleen White is a 70 y.o. female with a history of GERD, HTN, PAF and OSA.  She was referred for PSG due to snoring, PAF and excessive daytime sleepiness and PSG showed mild OSA with an AHI of 9.1/hr.  She underwent CPAP titration to 7cm H2O.  She is not here for folloup.  She goes to bed on average at 11pm and gets up at 6 - 7am.  She says that since going on CPAP she is sleeping longer and better at night.  She is doing well with her CPAP device.  She tolerates the nasal pillow mask and feels the pressure is adequate. She does not use a chin strap and does not want to use one.   Even though she is sleeping better at night and longer  she feels more rested in the am but not all the time.  She does not nap during the day.  She does not think she I snoring.  She has no mouth dryness or nasal congestion.     Past Medical History:  Diagnosis Date  . Allergic rhinitis, cause unspecified   . Alopecia   . Anxiety   . Colon polyps   . Diverticulosis of colon (without mention of hemorrhage)   . Fibrocystic breast disease   . GERD (gastroesophageal reflux disease)   . Headache(784.0)   . Hemorrhoids   . History of echocardiogram    Echo 3/17:  Vigorous LVF, EF 65-70%, normal wall motion, grade 1 diastolic, mildly elevated LVOT velocity of 2.2 m/s-likely related to vigorous LVF  . History of stress test    Myoview normal in 2/05. //  Stress echo (11/15) with no evidence for ischemia or infarction.  [name] HLD (hyperlipidemia)   . HTN (hypertension)   . Hypertension   . Mitral valve disorders(424.0)   . Non Hodgkin's lymphoma ([place])    Treated at [facility] with surgery and XRT.  Did not have chemotherapy.   . OSA (obstructive sleep  apnea) 03/29/2016  . Osteopenia   . PAF (paroxysmal atrial fibrillation) ([place])    a. CHADS2-VASc=3 //  b. Xarelto started in 2017  . Pulmonary nodule    Presumed benign after observation.   [name] Unspecified glaucoma(365.9)     Past Surgical History:  Procedure Laterality Date  . ABDOMINAL HYSTERECTOMY  1996  . EYE SURGERY  2009   Cataract  . 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 Cloyd Stagers, [facility]  . s/p ORIF prox phalanx  02/2007   right little finger----Dr Fredna Dow  . s/p TAH/BSO      Current Medications: Outpatient Medications Prior to Visit  Medication Sig Dispense Refill  . brimonidine-timolol (COMBIGAN) 0.2-0.5 % ophthalmic solution Place 1 drop into both eyes every 12 (twelve) hours.      . Calcium Carbonate-Vitamin D (CALCIUM 500 + D PO) Take 1 tablet by mouth 2 (two) times daily.    . cholecalciferol (VITAMIN D) 1000 UNITS tablet Take 1,000 Units by mouth daily.      [name] diltiazem (CARDIZEM CD) 120 MG 24 hr capsule Take 1 capsule (120 mg total) by mouth daily. 30 capsule 11  . docusate sodium (COLACE) 100  MG capsule Take 100 mg by mouth every evening. Take once daily     . esomeprazole (NEXIUM) 40 MG capsule Take 1 capsule (40 mg total) by mouth daily. 30 minutes before the evening meal 30 capsule 6  . loratadine (CLARITIN) 10 MG tablet Take 10 mg by mouth daily as needed for allergies. As needed     . Minoxidil 5 % FOAM Apply 3-5 mLs topically at bedtime. Apply to Scalp for hair loss at bedtime     . mirtazapine (REMERON) 7.5 MG tablet Take 2.5 mg by mouth at bedtime as needed (for scalp itching).    . Multiple Vitamins-Minerals (PRESERVISION AREDS 2 PO) Take 1 tablet by mouth 2 (two) times daily.     . pravastatin (PRAVACHOL) 40 MG tablet TAKE 1 TABLET ONCE DAILY. 90 tablet 3  . Probiotic Product (PROBIOTIC DAILY PO) Take 1 tablet by mouth daily.    [name] telmisartan-hydrochlorothiazide (MICARDIS HCT) 80-12.5 MG tablet Take 1 tablet by mouth daily. 30 tablet 11    . tretinoin (RETIN-A) 0.05 % cream Once daily     . vitamin C (ASCORBIC ACID) 500 MG tablet Take 500 mg by mouth daily.    Alveda Reasons 20 MG TABS tablet TAKE 1 TABLET DAILY WITH SUPPER. 30 tablet 10  . calcium citrate-vitamin D (CITRACAL+D) 315-200 MG-UNIT tablet Take 1 tablet by mouth 2 (two) times daily.     [name] HYDROcodone-acetaminophen (NORCO/VICODIN) 5-325 MG tablet Take 1-2 tablets by mouth every 6 (six) hours as needed. 10 tablet 0  . Multiple Vitamins-Minerals (CENTRUM) tablet Take 1 tablet by mouth daily.      . Omega-3 Fatty Acids (OMEGA 3 PO) Take 1 capsule by mouth daily.     No facility-administered medications prior to visit.      Allergies:   Review of patient's allergies indicates no known allergies.   Social History   Social History  . Marital status: Married    Spouse name: N/A  . Number of children: N/A  . Years of education: N/A   Social History Main Topics  . Smoking status: Former Smoker    Quit date: 08/27/1974  . Smokeless tobacco: Never Used  . Alcohol use No     Comment: occasionally  . Drug use: No  . Sexual activity: Yes   Other Topics Concern  . None   Social History Narrative  . None     Family History:  The patient's family history includes Cancer in her father and sister; Hypertension in her mother.   ROS:   Please see the history of present illness.    ROS All other systems reviewed and are negative.  No flowsheet data found.     PHYSICAL EXAM:   VS:  BP 126/80   Pulse 70   Ht 5' 3"  (1.6 m)   Wt 169 lb 12.8 oz (77 kg)   BMI 30.08 kg/m    GEN: Well nourished, well developed, in no acute distress  HEENT: normal  Neck: no JVD, carotid bruits, or masses Cardiac: RRR; no murmurs, rubs, or gallops,no edema.  Intact distal pulses bilaterally.  Respiratory:  clear to auscultation bilaterally, normal work of breathing GI: soft, nontender, nondistended, + BS MS: no deformity or atrophy  Skin: warm and dry, no rash Neuro:  Alert and  Oriented x 3, Strength and sensation are intact Psych: euthymic mood, full affect  Wt Readings from Last 3 Encounters:  05/28/16 169 lb 12.8 oz (77 kg)  04/02/16 165 lb (74.8  kg)  03/29/16 166 lb 12.8 oz (75.7 kg)      Studies/Labs Reviewed:   EKG:  EKG is not ordered today.    Recent Labs: 11/08/2015: TSH 2.67 12/21/2015: Magnesium 2.1 04/02/2016: BUN 10; Creatinine, Ser 0.70; Hemoglobin 13.6; Platelets 204; Potassium 3.4; Sodium 142   Lipid Panel    Component Value Date/Time   CHOL 163 12/21/2015 0926   TRIG 122.0 12/21/2015 0926   HDL 46.00 12/21/2015 0926   CHOLHDL 4 12/21/2015 0926   VLDL 24.4 12/21/2015 0926   LDLCALC 93 12/21/2015 0926   LDLDIRECT 142.5 09/30/2012 1110    Additional studies/ records that were reviewed today include:  CPAP download    ASSESSMENT:    1. OSA (obstructive sleep apnea)   2. Essential hypertension      PLAN:  In order of problems listed above:  OSA - the patient is tolerating PAP therapy well without any problems. The PAP download was reviewed today and showed an AHI of 2.4/hr on 7 cm H2O with 100% compliance in using more than 4 hours nightly.  The patient has been using and benefiting from CPAP use and will continue to benefit from therapy.  2.  HTN - BP controlled on current meds. Continue CCB/ARB and diuretic..    Medication Adjustments/Labs and Tests Ordered: Current medicines are reviewed at length with the patient today.  Concerns regarding medicines are outlined above.  Medication changes, Labs and Tests ordered today are listed in the Patient Instructions below.  There are no Patient Instructions on file for this visit.   Signed, Fransico Him, MD  05/28/2016 11:05 AM    [address], [place], [place]  [address] Phone: [phone]; Fax: [phone]

## 2016-05-28 NOTE — Patient Instructions (Signed)

## 2016-05-30 ENCOUNTER — Ambulatory Visit (INDEPENDENT_AMBULATORY_CARE_PROVIDER_SITE_OTHER): Payer: Medicare Other | Admitting: Cardiology

## 2016-05-30 ENCOUNTER — Ambulatory Visit: Payer: Medicare Other | Admitting: Cardiology

## 2016-05-30 VITALS — BP 124/74 | HR 74 | Ht 63.0 in | Wt 168.0 lb

## 2016-05-30 DIAGNOSIS — G4733 Obstructive sleep apnea (adult) (pediatric): Secondary | ICD-10-CM | POA: Diagnosis not present

## 2016-05-30 DIAGNOSIS — I10 Essential (primary) hypertension: Secondary | ICD-10-CM | POA: Diagnosis not present

## 2016-05-30 DIAGNOSIS — I4891 Unspecified atrial fibrillation: Secondary | ICD-10-CM | POA: Diagnosis not present

## 2016-05-30 NOTE — Patient Instructions (Signed)
Medication Instructions:  Your physician recommends that you continue on your current medications as directed. Please refer to the Current Medication list given to you today.   Labwork: None   Testing/Procedures: none  Follow-Up: Your physician wants you to follow-up in: 6 months with Dr Aundra Dubin in the [facility] at [facility]. (April 2018).  You will receive a reminder letter in the mail two months in advance. If you don't receive a letter, please call our office to schedule the follow-up appointment.        If you need a refill on your cardiac medications before your next appointment, please call your pharmacy.

## 2016-05-31 ENCOUNTER — Encounter: Payer: Self-pay | Admitting: Cardiology

## 2016-05-31 NOTE — Progress Notes (Signed)
Patient ID: Yatziri Wainwright, female   DOB: 31-Jul-1946, 70 y.o.   MRN: [id number] PCP: Dr. Ronnald Ramp  70 yo with history of HTN, hyperlipidemia, paroxysmal atrial fibrillation and prior non-Hodgkins lymphoma presents for cardiology followup.  She called the office in 1/17 to report increased episodes of palpitations.  She had had rare tachypalpitations for years, but in 1/17 she had 2 episodes of tachypalpitations close together.  I had her wear an event monitor.  By monitor, her tachypalpitations correlated with atrial fibrillation with RVR.  She was started on Xarelto and Toprol XL.  Toprol XL caused swelling, so it was stopped and diltiazem CD was begun.  Echo in 3/17 showed EF 65-70% with mildly elevated LVOT velocity due to vigorous contraction.  She is now doing well, no recent palpitations.  No exertional dyspnea or chest pain.  No lightheadedness or syncope.  She is using CPAP. No BRBPR or melena.   ECG: NSR, normal  Labs (4/15): K 3.7, creatinine 0.6, LDL 111, HDL 46 Labs (4/16): LDL 89 Labs (3/17): K 3.8, creatinine 0.7, HCT 37.3 Labs (4/17): LDL 93, HDL 46 Labs (8/17): hgb 13.1, K 3.4, creatinine 0.7  PMH: 1. Echo (11/15): EF 60-65%, trivial MR. Echo (3/17) with EF 65-70%, mildly elevated LVOT velocity due to vigorous contraction. 2. Chest pain: Myoview normal in 2/05. Stress echo (11/15) with no evidence for ischemia or infarction.  3. Suspected glaucoma 4. HTN 5. Hyperlipidemia 6. GERD 7. Diverticulosis 8. TAH/BSO 9. Non-Hodgkins lymphoma: In remission.  Treated at [facility] with surgery and XRT.  Did not have chemotherapy.  10. Pulmonary nodules: Presumed benign after observation.  11. Atrial fibrillation: Paroxysmal.  12. OSA: Uses CPAP.   SH: Married, cares for elderly mother, nonsmoker.  Lives in [place].  FH: Mother with atrial fibrillation and CAD.   ROS: All systems reviewed and negative except as per HPI.   Current Outpatient Prescriptions  Medication Sig Dispense  Refill  . brimonidine-timolol (COMBIGAN) 0.2-0.5 % ophthalmic solution Place 1 drop into both eyes every 12 (twelve) hours.      . Calcium Carbonate-Vitamin D (CALCIUM 500 + D PO) Take 1 tablet by mouth 2 (two) times daily.    . cholecalciferol (VITAMIN D) 1000 UNITS tablet Take 1,000 Units by mouth daily.      . Coenzyme Q10 (CO Q 10) 100 MG CAPS Take 1 capsule by mouth daily.    [name] diltiazem (CARDIZEM CD) 120 MG 24 hr capsule Take 1 capsule (120 mg total) by mouth daily. 30 capsule 11  . docusate sodium (COLACE) 100 MG capsule Take 100 mg by mouth every evening. Take once daily     . esomeprazole (NEXIUM) 40 MG capsule Take 1 capsule (40 mg total) by mouth daily. 30 minutes before the evening meal 30 capsule 6  . loratadine (CLARITIN) 10 MG tablet Take 10 mg by mouth daily as needed for allergies. As needed     . Minoxidil 5 % FOAM Apply 3-5 mLs topically at bedtime. Apply to Scalp for hair loss at bedtime     . mirtazapine (REMERON) 7.5 MG tablet Take 2.5 mg by mouth at bedtime as needed (for scalp itching).    . Multiple Vitamins-Minerals (PRESERVISION AREDS 2 PO) Take 1 tablet by mouth 2 (two) times daily.     . pravastatin (PRAVACHOL) 40 MG tablet TAKE 1 TABLET ONCE DAILY. 90 tablet 3  . Probiotic Product (PROBIOTIC DAILY PO) Take 1 tablet by mouth daily.    [name] telmisartan-hydrochlorothiazide (MICARDIS HCT)  80-12.5 MG tablet Take 1 tablet by mouth daily. 30 tablet 11  . tretinoin (RETIN-A) 0.05 % cream Once daily     . vitamin C (ASCORBIC ACID) 500 MG tablet Take 500 mg by mouth daily.    Alveda Reasons 20 MG TABS tablet TAKE 1 TABLET DAILY WITH SUPPER. 30 tablet 10   No current facility-administered medications for this visit.     BP 124/74   Pulse 74   Ht 5' 3"  (1.6 m)   Wt 168 lb (76.2 kg)   BMI 29.76 kg/m  General: NAD Neck: No JVD, no thyromegaly or thyroid nodule.  Lungs: Clear to auscultation bilaterally with normal respiratory effort. CV: Nondisplaced PMI.  Heart regular S1/S2,  no S3/S4, 2/6 early SEM RUSB.  No peripheral edema.  No carotid bruit.  Normal pedal pulses.  Abdomen: Soft, nontender, no hepatosplenomegaly, no distention.  Skin: Intact without lesions or rashes.  Neurologic: Alert and oriented x 3.  Psych: Normal affect. Extremities: No clubbing or cyanosis.  HEENT: Normal.   Assessment/Plan: 1. HTN: BP is controlled on current regimen, continue.  2. Hyperlipidemia: Reasonable lipids in 4/17.  3. Atrial fibrillation: Paroxysmal.  First noted in 1/17.  Rare episodes, no symptomatic episodes so far while on diltiazem CD.   CHADSVASC = 3.  - Continue diltiazem CD 120 daily.  - Continue Xarelto 20 daily.  - She is now using CPAP. 4. OSA: Using CPAP.   [name] 05/31/2016

## 2016-06-29 DIAGNOSIS — H401122 Primary open-angle glaucoma, left eye, moderate stage: Secondary | ICD-10-CM | POA: Diagnosis not present

## 2016-06-29 DIAGNOSIS — H04123 Dry eye syndrome of bilateral lacrimal glands: Secondary | ICD-10-CM | POA: Diagnosis not present

## 2016-06-29 DIAGNOSIS — H401112 Primary open-angle glaucoma, right eye, moderate stage: Secondary | ICD-10-CM | POA: Diagnosis not present

## 2016-07-02 DIAGNOSIS — Z9189 Other specified personal risk factors, not elsewhere classified: Secondary | ICD-10-CM | POA: Diagnosis not present

## 2016-07-02 DIAGNOSIS — R928 Other abnormal and inconclusive findings on diagnostic imaging of breast: Secondary | ICD-10-CM | POA: Diagnosis not present

## 2016-07-05 DIAGNOSIS — N6011 Diffuse cystic mastopathy of right breast: Secondary | ICD-10-CM | POA: Diagnosis not present

## 2016-07-05 DIAGNOSIS — Z6828 Body mass index (BMI) 28.0-28.9, adult: Secondary | ICD-10-CM | POA: Diagnosis not present

## 2016-07-05 DIAGNOSIS — Z1231 Encounter for screening mammogram for malignant neoplasm of breast: Secondary | ICD-10-CM | POA: Diagnosis not present

## 2016-07-05 DIAGNOSIS — N6012 Diffuse cystic mastopathy of left breast: Secondary | ICD-10-CM | POA: Diagnosis not present

## 2016-07-17 DIAGNOSIS — C8290 Follicular lymphoma, unspecified, unspecified site: Secondary | ICD-10-CM | POA: Diagnosis not present

## 2016-07-18 ENCOUNTER — Ambulatory Visit: Payer: Medicare Other | Admitting: Cardiology

## 2016-07-23 ENCOUNTER — Telehealth: Payer: Self-pay | Admitting: Cardiology

## 2016-07-23 NOTE — Telephone Encounter (Signed)
Pt wants to talk to Webb Silversmith (only) aware she is off today-regarding an upcoming appt and some concerns she has [phone]

## 2016-07-24 ENCOUNTER — Other Ambulatory Visit: Payer: Self-pay | Admitting: Internal Medicine

## 2016-07-24 DIAGNOSIS — I10 Essential (primary) hypertension: Secondary | ICD-10-CM

## 2016-07-25 NOTE — Telephone Encounter (Signed)
Pt states she took an extra Cardizem CD 123m and at fib resolved after about 30 minutes.  Pt states this is the first episode she has had since office visit with Dr MAundra Dubinin October 2017.  Pt states she has not checked her BP or HR since Saturday, has felt fine since episode on Saturday.  Pt states she has not missed any doses of Xarelto.  Pt is asking if she needs to come in for check up or okay to wait to see Dr MAundra Dubinin 1 year in [facility] as decided at time of husband's office visit with Dr MAundra Dubinlast week.   Pt advised I will forward to Dr MAundra Dubinfor review.

## 2016-07-25 NOTE — Telephone Encounter (Signed)
Pt states she had an episode of atrial fibrillation this past Saturday that lasted about 30 minutes.

## 2016-07-25 NOTE — Telephone Encounter (Signed)
Discussed Dr Claris Gladden recommendations with pt.

## 2016-07-25 NOTE — Telephone Encounter (Signed)
New message   Pt verbalized that she is returning call for rn and didn't want to disclose any more information

## 2016-07-25 NOTE — Telephone Encounter (Signed)
As long as no frequent or prolonged recurrences, continue Xarelto and take extra diltiazem prn and can see me as scheduled.

## 2016-08-29 DIAGNOSIS — H43813 Vitreous degeneration, bilateral: Secondary | ICD-10-CM | POA: Diagnosis not present

## 2016-08-29 DIAGNOSIS — H26493 Other secondary cataract, bilateral: Secondary | ICD-10-CM | POA: Diagnosis not present

## 2016-08-29 DIAGNOSIS — H401132 Primary open-angle glaucoma, bilateral, moderate stage: Secondary | ICD-10-CM | POA: Diagnosis not present

## 2016-08-29 DIAGNOSIS — H52203 Unspecified astigmatism, bilateral: Secondary | ICD-10-CM | POA: Diagnosis not present

## 2016-09-11 ENCOUNTER — Ambulatory Visit (INDEPENDENT_AMBULATORY_CARE_PROVIDER_SITE_OTHER): Payer: Medicare Other | Admitting: General Practice

## 2016-09-11 DIAGNOSIS — Z23 Encounter for immunization: Secondary | ICD-10-CM

## 2016-09-26 ENCOUNTER — Other Ambulatory Visit: Payer: Self-pay | Admitting: Internal Medicine

## 2016-09-26 DIAGNOSIS — R8299 Other abnormal findings in urine: Secondary | ICD-10-CM | POA: Diagnosis not present

## 2016-09-26 DIAGNOSIS — Z124 Encounter for screening for malignant neoplasm of cervix: Secondary | ICD-10-CM | POA: Diagnosis not present

## 2016-09-26 NOTE — Telephone Encounter (Signed)
rx faxed to [facility].

## 2016-09-27 DIAGNOSIS — H26491 Other secondary cataract, right eye: Secondary | ICD-10-CM | POA: Diagnosis not present

## 2016-10-02 ENCOUNTER — Other Ambulatory Visit: Payer: Self-pay | Admitting: Internal Medicine

## 2016-10-02 DIAGNOSIS — I10 Essential (primary) hypertension: Secondary | ICD-10-CM

## 2016-10-02 MED ORDER — TELMISARTAN-HCTZ 80-12.5 MG PO TABS: 1.0000 | ORAL_TABLET | Freq: Every day | ORAL | 0 refills | Status: DC

## 2016-10-02 NOTE — Telephone Encounter (Signed)
Pt has CPE scheduled for April. Can we send in rx to get pt to that appt?

## 2016-10-04 DIAGNOSIS — C8290 Follicular lymphoma, unspecified, unspecified site: Secondary | ICD-10-CM | POA: Diagnosis not present

## 2016-10-16 ENCOUNTER — Other Ambulatory Visit: Payer: Medicare Other | Admitting: *Deleted

## 2016-10-16 DIAGNOSIS — Z5181 Encounter for therapeutic drug level monitoring: Secondary | ICD-10-CM

## 2016-10-16 DIAGNOSIS — I4891 Unspecified atrial fibrillation: Secondary | ICD-10-CM | POA: Diagnosis not present

## 2016-10-16 LAB — BASIC METABOLIC PANEL
BUN/Creatinine Ratio: 14 (ref 12–28)
BUN: 9 mg/dL (ref 8–27)
CO2: 25 mmol/L (ref 18–29)
Calcium: 8.8 mg/dL (ref 8.7–10.3)
Chloride: 100 mmol/L (ref 96–106)
Creatinine, Ser: 0.65 mg/dL (ref 0.57–1.00)
GFR calc Af Amer: 103 (ref >59)
GFR calc non Af Amer: 90 (ref >59)
Glucose: 129 mg/dL — ABNORMAL HIGH (ref 65–99)
Potassium: 3.7 mmol/L (ref 3.5–5.2)
Sodium: 143 mmol/L (ref 134–144)

## 2016-10-16 LAB — CBC WITH DIFFERENTIAL/PLATELET
Basophils Absolute: 0 10*3/uL (ref 0.0–0.2)
Basos: 0 %
EOS (ABSOLUTE): 0.1 10*3/uL (ref 0.0–0.4)
Eos: 2 %
Hematocrit: 36.5 % (ref 34.0–46.6)
Hemoglobin: 12.2 g/dL (ref 11.1–15.9)
Immature Grans (Abs): 0 10*3/uL (ref 0.0–0.1)
Immature Granulocytes: 0 %
Lymphocytes Absolute: 1 10*3/uL (ref 0.7–3.1)
Lymphs: 22 %
MCH: 30.5 pg (ref 26.6–33.0)
MCHC: 33.4 g/dL (ref 31.5–35.7)
MCV: 91 fL (ref 79–97)
Monocytes Absolute: 0.4 10*3/uL (ref 0.1–0.9)
Monocytes: 9 %
Neutrophils Absolute: 3.1 10*3/uL (ref 1.4–7.0)
Neutrophils: 67 %
Platelets: 216 10*3/uL (ref 150–379)
RBC: 4 x10E6/uL (ref 3.77–5.28)
RDW: 12.8 % (ref 12.3–15.4)
WBC: 4.7 10*3/uL (ref 3.4–10.8)

## 2016-10-16 NOTE — Addendum Note (Signed)
Addended by: Eulis Foster on: 10/16/2016 12:03 PM   Modules accepted: Orders

## 2016-10-18 DIAGNOSIS — H26492 Other secondary cataract, left eye: Secondary | ICD-10-CM | POA: Diagnosis not present

## 2016-10-20 ENCOUNTER — Ambulatory Visit (INDEPENDENT_AMBULATORY_CARE_PROVIDER_SITE_OTHER): Payer: Medicare Other | Admitting: Emergency Medicine

## 2016-10-20 ENCOUNTER — Ambulatory Visit (INDEPENDENT_AMBULATORY_CARE_PROVIDER_SITE_OTHER): Payer: Medicare Other

## 2016-10-20 VITALS — BP 108/64 | HR 60 | Temp 98.1°F | Resp 16 | Ht 63.0 in | Wt 165.0 lb

## 2016-10-20 DIAGNOSIS — S93602A Unspecified sprain of left foot, initial encounter: Secondary | ICD-10-CM

## 2016-10-20 DIAGNOSIS — M79672 Pain in left foot: Secondary | ICD-10-CM | POA: Diagnosis not present

## 2016-10-20 DIAGNOSIS — S99922A Unspecified injury of left foot, initial encounter: Secondary | ICD-10-CM | POA: Diagnosis not present

## 2016-10-20 NOTE — Patient Instructions (Signed)
Foot Sprain Introduction A foot sprain is an injury to one of the strong bands of tissue (ligaments) that connect and support the many bones in your feet. The ligament can be stretched too much or it can tear. A tear can be either partial or complete. The severity of the sprain depends on how much of the ligament was damaged or torn. What are the causes? A foot sprain is usually caused by suddenly twisting or pivoting your foot. What increases the risk? This injury is more likely to occur in people who:  Play a sport, such as basketball or football.  Exercise or play a sport without warming up.  Start a new workout or sport.  Suddenly increase how long or hard they exercise or play a sport. What are the signs or symptoms? Symptoms of this condition start soon after an injury and include:  Pain, especially in the arch of the foot.  Bruising.  Swelling.  Inability to walk or use the foot to support body weight. How is this diagnosed? This condition is diagnosed with a medical history and physical exam. You may also have imaging tests, such as:  X-rays to make sure there are no broken bones (fractures).  MRI to see if the ligament has torn. How is this treated? Treatment varies depending on the severity of your sprain. Mild sprains can be treated with rest, ice, compression, and elevation (RICE). If your ligament is overstretched or partially torn, treatment usually involves keeping your foot in a fixed position (immobilization) for a period of time. To help you do this, your health care provider will apply a bandage, splint, or walking boot to keep your foot from moving until it heals. You may also be advised to use crutches or a scooter for a few weeks to avoid bearing weight on your foot while it is healing. If your ligament is fully torn, you may need surgery to reconnect the ligament to the bone. After surgery, a cast or splint will be applied and will need to stay on your foot  while it heals. Your health care provider may also suggest exercises or physical therapy to strengthen your foot. Follow these instructions at home: If You Have a Bandage, Splint, or Walking Boot:  Wear it as directed by your health care provider. Remove it only as directed by your health care provider.  Loosen the bandage, splint, or walking boot if your toes become numb and tingle, or if they turn cold and blue. Bathing  If your health care provider approves bathing and showering, cover the bandage or splint with a watertight plastic bag to protect it from water. Do not let the bandage or splint get wet. Managing pain, stiffness, and swelling  If directed, apply ice to the injured area:  Put ice in a plastic bag.  Place a towel between your skin and the bag.  Leave the ice on for 20 minutes, 2-3 times per day.  Move your toes often to avoid stiffness and to lessen swelling.  Raise (elevate) the injured area above the level of your heart while you are sitting or lying down. Driving  Do not drive or operate heavy machinery while taking pain medicine.  Ask your health care provider when it is safe to drive if you have a bandage, splint, or walking boot on your foot. Activity  Rest as directed by your health care provider.  Do not use the injured foot to support your body weight until your health care provider  says that you can. Use crutches or other supportive devices as directed by your health care provider.  Ask your health care provider what activities are safe for you. Gradually increase how much and how far you walk until your health care provider says it is safe to return to full activity.  Do any exercise or physical therapy as directed by your health care provider. General instructions  If a splint was applied, do not put pressure on any part of it until it is fully hardened. This may take several hours.  Take medicines only as directed by your health care provider.  These include over-the-counter medicines and prescription medicines.  Keep all follow-up visits as directed by your health care provider. This is important.  When you can walk without pain, wear supportive shoes that have stiff soles. Do not wear flip-flops, and do not walk barefoot. Contact a health care provider if:  Your pain is not controlled with medicine.  Your bruising or swelling gets worse or does not get better with treatment.  Your splint or walking boot is damaged. Get help right away if:  You develop severe numbness or tingling in your foot.  Your foot turns blue, white, or gray, and it feels cold. This information is not intended to replace advice given to you by your health care provider. Make sure you discuss any questions you have with your health care provider. Document Released: 02/02/2002 Document Revised: 01/19/2016 Document Reviewed: 06/16/2014  2017 [employer]

## 2016-10-20 NOTE — Progress Notes (Signed)
Colleen White 70 y.o.   Chief Complaint  Patient presents with  . Foot Injury    Left foot pain, ice and motrin relieve pain. Pt fell at gym yesterday morning     HISTORY OF PRESENT ILLNESS: This is a 71 y.o. female complaining of left foot pain after injury yesterday at gym.  Foot Injury   The incident occurred 12 to 24 hours ago. The incident occurred at the gym. The injury mechanism was a twisting injury. The pain is present in the left foot. The quality of the pain is described as aching. The pain is at a severity of 3/10. The pain is mild. The pain has been fluctuating since onset. Pertinent negatives include no loss of motion, loss of sensation, numbness or tingling. The symptoms are aggravated by weight bearing.     Prior to Admission medications   Medication Sig Start Date End Date Taking? Authorizing Provider  brimonidine-timolol (COMBIGAN) 0.2-0.5 % ophthalmic solution Place 1 drop into both eyes every 12 (twelve) hours.     Yes Historical Provider, MD  Calcium Carbonate-Vitamin D (CALCIUM 500 + D PO) Take 1 tablet by mouth 2 (two) times daily.   Yes Historical Provider, MD  cholecalciferol (VITAMIN D) 1000 UNITS tablet Take 1,000 Units by mouth daily.     Yes Historical Provider, MD  Coenzyme Q10 (CO Q 10) 100 MG CAPS Take 1 capsule by mouth daily.   Yes Historical Provider, MD  diltiazem (CARDIZEM CD) 120 MG 24 hr capsule Take 1 capsule (120 mg total) by mouth daily. 10/31/15  Yes Larey Dresser, MD  docusate sodium (COLACE) 100 MG capsule Take 100 mg by mouth every evening. Take once daily    Yes Historical Provider, MD  esomeprazole (NEXIUM) 40 MG capsule Take 1 capsule (40 mg total) by mouth daily. 30 minutes before the evening meal 11/06/13  Yes Noralee Space, MD  loratadine (CLARITIN) 10 MG tablet Take 10 mg by mouth daily as needed for allergies. As needed    Yes Historical Provider, MD  Minoxidil 5 % FOAM Apply 3-5 mLs topically at bedtime. Apply to Scalp for hair loss  at bedtime    Yes Historical Provider, MD  mirtazapine (REMERON) 7.5 MG tablet Take 2.5 mg by mouth at bedtime as needed (for scalp itching).   Yes Historical Provider, MD  Multiple Vitamins-Minerals (PRESERVISION AREDS 2 PO) Take 1 tablet by mouth 2 (two) times daily.    Yes Historical Provider, MD  pravastatin (PRAVACHOL) 40 MG tablet TAKE 1 TABLET ONCE DAILY. 05/28/16  Yes Janith Lima, MD  Probiotic Product (PROBIOTIC DAILY PO) Take 1 tablet by mouth daily.   Yes Historical Provider, MD  telmisartan-hydrochlorothiazide (MICARDIS HCT) 80-12.5 MG tablet Take 1 tablet by mouth daily. 10/02/16  Yes Janith Lima, MD  traMADol (ULTRAM) 50 MG tablet TAKE (1) TABLET TWICE A DAY AS NEEDED. 09/26/16  Yes Janith Lima, MD  tretinoin (RETIN-A) 0.05 % cream Once daily    Yes Historical Provider, MD  vitamin C (ASCORBIC ACID) 500 MG tablet Take 500 mg by mouth daily.   Yes Historical Provider, MD  XARELTO 20 MG TABS tablet TAKE 1 TABLET DAILY WITH SUPPER. 11/24/15  Yes Larey Dresser, MD    No Known Allergies  Patient Active Problem List   Diagnosis Date Noted  . OSA (obstructive sleep apnea) 03/29/2016  . Paroxysmal atrial fibrillation ([place]) 11/08/2015  . 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 Excessive daytime sleepiness 04/12/2014  . Routine  general medical examination at a health care facility 11/30/2013  . Other abnormal and inconclusive findings on diagnostic imaging of breast 06/18/2013  . 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 [facility]) 10/20/2010  . GLAUCOMA 08/04/2008  . ALLERGIC RHINITIS 12/17/2007  . Disorder of bone and cartilage 12/17/2007  . Disease of lung 12/17/2007  . Bone/cartilage disorder 12/17/2007  . HYPERCHOLESTEROLEMIA 07/31/2007  . Essential hypertension 07/31/2007  . MITRAL VALVE PROLAPSE 07/31/2007  . GERD 07/31/2007    Past Medical History:  Diagnosis Date  . Allergic rhinitis, cause unspecified   . Alopecia   . Anxiety   .  Colon polyps   . Diverticulosis of colon (without mention of hemorrhage)   . Fibrocystic breast disease   . GERD (gastroesophageal reflux disease)   . Headache(784.0)   . Hemorrhoids   . History of echocardiogram    Echo 3/17:  Vigorous LVF, EF 65-70%, normal wall motion, grade 1 diastolic, mildly elevated LVOT velocity of 2.2 m/s-likely related to vigorous LVF  . History of stress test    Myoview normal in 2/05. //  Stress echo (11/15) with no evidence for ischemia or infarction.  [name] HLD (hyperlipidemia)   . HTN (hypertension)   . Hypertension   . Mitral valve disorders(424.0)   . Non Hodgkin's lymphoma ([place])    Treated at [facility] with surgery and XRT.  Did not have chemotherapy.   . OSA (obstructive sleep apnea) 03/29/2016  . Osteopenia   . PAF (paroxysmal atrial fibrillation) ([place])    a. CHADS2-VASc=3 //  b. Xarelto started in 2017  . Pulmonary nodule    Presumed benign after observation.   [name] Unspecified glaucoma(365.9)     Past Surgical History:  Procedure Laterality Date  . ABDOMINAL HYSTERECTOMY  1996  . EYE SURGERY  2009   Cataract  . 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 Cloyd Stagers, [facility]  . s/p ORIF prox phalanx  02/2007   right little finger----Dr Fredna Dow  . s/p TAH/BSO      Social History   Social History  . Marital status: Married    Spouse name: N/A  . Number of children: N/A  . Years of education: N/A   Occupational History  . Not on file.   Social History Main Topics  . Smoking status: Former Smoker    Quit date: 08/27/1974  . Smokeless tobacco: Never Used  . Alcohol use No     Comment: occasionally  . Drug use: No  . Sexual activity: Yes   Other Topics Concern  . Not on file   Social History Narrative  . No narrative on file    Family History  Problem Relation Age of Onset  . Hypertension Mother   . Cancer Father     throat  . Cancer Sister     breast (sister)     Review of Systems  Constitutional: Negative for  chills and fever.  Respiratory: Negative for shortness of breath.   Cardiovascular: Negative for chest pain and palpitations.  Gastrointestinal: Negative for nausea and vomiting.  Musculoskeletal:       Left foot pain.  Skin: Negative for rash.  Neurological: Negative for dizziness, tingling, sensory change, focal weakness and numbness.  Endo/Heme/Allergies: Does not bruise/bleed easily.  All other systems reviewed and are negative.  Vitals:   10/20/16 0805  BP: 108/64  Pulse: 60  Resp: 16  Temp: 98.1 F (36.7 C)  Physical Exam  Constitutional: She is oriented to person, place, and time. She appears well-developed and well-nourished.  HENT:  Head: Normocephalic and atraumatic.  Eyes: EOM are normal. Pupils are equal, round, and reactive to light.  Neck: Normal range of motion. Neck supple.  Cardiovascular: Normal rate and regular rhythm.   Pulmonary/Chest: Effort normal.  Musculoskeletal: Normal range of motion.  Left foot: NVI with FROM, no obvious ecchymosis or deformity. No swelling or localized tenderness.  Neurological: She is alert and oriented to person, place, and time. No sensory deficit. She exhibits normal muscle tone.  Skin: Skin is warm and dry. Capillary refill takes less than 2 seconds.  Psychiatric: She has a normal mood and affect. Her behavior is normal.  Vitals reviewed.  X-ray reviewed by me: No Fx  ASSESSMENT & PLAN: Yula was seen today for foot injury.  Diagnoses and all orders for this visit:  Foot sprain, left, initial encounter -     DG Foot Complete Left; Future  Left foot pain   Patient Instructions  Foot Sprain Introduction A foot sprain is an injury to one of the strong bands of tissue (ligaments) that connect and support the many bones in your feet. The ligament can be stretched too much or it can tear. A tear can be either partial or complete. The severity of the sprain depends on how much of the ligament was damaged or  torn. What are the causes? A foot sprain is usually caused by suddenly twisting or pivoting your foot. What increases the risk? This injury is more likely to occur in people who:  Play a sport, such as basketball or football.  Exercise or play a sport without warming up.  Start a new workout or sport.  Suddenly increase how long or hard they exercise or play a sport. What are the signs or symptoms? Symptoms of this condition start soon after an injury and include:  Pain, especially in the arch of the foot.  Bruising.  Swelling.  Inability to walk or use the foot to support body weight. How is this diagnosed? This condition is diagnosed with a medical history and physical exam. You may also have imaging tests, such as:  X-rays to make sure there are no broken bones (fractures).  MRI to see if the ligament has torn. How is this treated? Treatment varies depending on the severity of your sprain. Mild sprains can be treated with rest, ice, compression, and elevation (RICE). If your ligament is overstretched or partially torn, treatment usually involves keeping your foot in a fixed position (immobilization) for a period of time. To help you do this, your health care provider will apply a bandage, splint, or walking boot to keep your foot from moving until it heals. You may also be advised to use crutches or a scooter for a few weeks to avoid bearing weight on your foot while it is healing. If your ligament is fully torn, you may need surgery to reconnect the ligament to the bone. After surgery, a cast or splint will be applied and will need to stay on your foot while it heals. Your health care provider may also suggest exercises or physical therapy to strengthen your foot. Follow these instructions at home: If You Have a Bandage, Splint, or Walking Boot:  Wear it as directed by your health care provider. Remove it only as directed by your health care provider.  Loosen the bandage,  splint, or walking boot if your toes become numb and  tingle, or if they turn cold and blue. Bathing  If your health care provider approves bathing and showering, cover the bandage or splint with a watertight plastic bag to protect it from water. Do not let the bandage or splint get wet. Managing pain, stiffness, and swelling  If directed, apply ice to the injured area:  Put ice in a plastic bag.  Place a towel between your skin and the bag.  Leave the ice on for 20 minutes, 2-3 times per day.  Move your toes often to avoid stiffness and to lessen swelling.  Raise (elevate) the injured area above the level of your heart while you are sitting or lying down. Driving  Do not drive or operate heavy machinery while taking pain medicine.  Ask your health care provider when it is safe to drive if you have a bandage, splint, or walking boot on your foot. Activity  Rest as directed by your health care provider.  Do not use the injured foot to support your body weight until your health care provider says that you can. Use crutches or other supportive devices as directed by your health care provider.  Ask your health care provider what activities are safe for you. Gradually increase how much and how far you walk until your health care provider says it is safe to return to full activity.  Do any exercise or physical therapy as directed by your health care provider. General instructions  If a splint was applied, do not put pressure on any part of it until it is fully hardened. This may take several hours.  Take medicines only as directed by your health care provider. These include over-the-counter medicines and prescription medicines.  Keep all follow-up visits as directed by your health care provider. This is important.  When you can walk without pain, wear supportive shoes that have stiff soles. Do not wear flip-flops, and do not walk barefoot. Contact a health care provider if:  Your  pain is not controlled with medicine.  Your bruising or swelling gets worse or does not get better with treatment.  Your splint or walking boot is damaged. Get help right away if:  You develop severe numbness or tingling in your foot.  Your foot turns blue, white, or gray, and it feels cold. This information is not intended to replace advice given to you by your health care provider. Make sure you discuss any questions you have with your health care provider. Document Released: 02/02/2002 Document Revised: 01/19/2016 Document Reviewed: 06/16/2014  2017 Elsevier     Agustina Caroli, MD [facility]

## 2016-10-23 DIAGNOSIS — C8590 Non-Hodgkin lymphoma, unspecified, unspecified site: Secondary | ICD-10-CM | POA: Diagnosis not present

## 2016-10-23 DIAGNOSIS — C8290 Follicular lymphoma, unspecified, unspecified site: Secondary | ICD-10-CM | POA: Diagnosis not present

## 2016-10-23 DIAGNOSIS — R918 Other nonspecific abnormal finding of lung field: Secondary | ICD-10-CM | POA: Diagnosis not present

## 2016-10-23 DIAGNOSIS — K5730 Diverticulosis of large intestine without perforation or abscess without bleeding: Secondary | ICD-10-CM | POA: Diagnosis not present

## 2016-10-29 ENCOUNTER — Other Ambulatory Visit: Payer: Self-pay | Admitting: Cardiology

## 2016-10-30 NOTE — Telephone Encounter (Signed)
Age 71 Wt 76.2 kg 05/30/2016 Saw Dr Aundra Dubin on 05/30/2016  10/16/2016 Hgb 12.2 HCT 36.5 SrCr 0.65 CrCl 94.1 Refill done for Xarelto 20 mg daily

## 2016-11-16 DIAGNOSIS — H04123 Dry eye syndrome of bilateral lacrimal glands: Secondary | ICD-10-CM | POA: Diagnosis not present

## 2016-11-16 DIAGNOSIS — H401132 Primary open-angle glaucoma, bilateral, moderate stage: Secondary | ICD-10-CM | POA: Diagnosis not present

## 2016-12-24 ENCOUNTER — Ambulatory Visit (INDEPENDENT_AMBULATORY_CARE_PROVIDER_SITE_OTHER): Payer: Medicare Other | Admitting: Internal Medicine

## 2016-12-24 ENCOUNTER — Encounter: Payer: Self-pay | Admitting: Internal Medicine

## 2016-12-24 ENCOUNTER — Other Ambulatory Visit (INDEPENDENT_AMBULATORY_CARE_PROVIDER_SITE_OTHER): Payer: Medicare Other

## 2016-12-24 VITALS — BP 108/60 | HR 62 | Temp 97.4°F | Resp 16 | Ht 63.0 in | Wt 160.5 lb

## 2016-12-24 DIAGNOSIS — R7989 Other specified abnormal findings of blood chemistry: Secondary | ICD-10-CM

## 2016-12-24 DIAGNOSIS — E7800 Pure hypercholesterolemia, unspecified: Secondary | ICD-10-CM

## 2016-12-24 DIAGNOSIS — D539 Nutritional anemia, unspecified: Secondary | ICD-10-CM

## 2016-12-24 DIAGNOSIS — I10 Essential (primary) hypertension: Secondary | ICD-10-CM | POA: Diagnosis not present

## 2016-12-24 DIAGNOSIS — R739 Hyperglycemia, unspecified: Secondary | ICD-10-CM | POA: Diagnosis not present

## 2016-12-24 DIAGNOSIS — R945 Abnormal results of liver function studies: Secondary | ICD-10-CM

## 2016-12-24 DIAGNOSIS — E559 Vitamin D deficiency, unspecified: Secondary | ICD-10-CM

## 2016-12-24 DIAGNOSIS — K219 Gastro-esophageal reflux disease without esophagitis: Secondary | ICD-10-CM

## 2016-12-24 DIAGNOSIS — Z0000 Encounter for general adult medical examination without abnormal findings: Secondary | ICD-10-CM

## 2016-12-24 LAB — BASIC METABOLIC PANEL
BUN: 11 mg/dL (ref 6–23)
CO2: 33 mEq/L — ABNORMAL HIGH (ref 19–32)
Calcium: 9.8 mg/dL (ref 8.4–10.5)
Chloride: 100 mEq/L (ref 96–112)
Creatinine, Ser: 0.73 mg/dL (ref 0.40–1.20)
GFR: 83.25 mL/min (ref >60.00)
Glucose, Bld: 102 mg/dL — ABNORMAL HIGH (ref 70–99)
Potassium: 3.8 mEq/L (ref 3.5–5.1)
Sodium: 140 mEq/L (ref 135–145)

## 2016-12-24 LAB — CBC WITH DIFFERENTIAL/PLATELET
Basophils Absolute: 0 10*3/uL (ref 0.0–0.1)
Basophils Relative: 0.4 % (ref 0.0–3.0)
Eosinophils Absolute: 0.1 10*3/uL (ref 0.0–0.7)
Eosinophils Relative: 1.2 % (ref 0.0–5.0)
HCT: 40.4 % (ref 36.0–46.0)
Hemoglobin: 13.7 g/dL (ref 12.0–15.0)
Lymphocytes Relative: 20.2 % (ref 12.0–46.0)
Lymphs Abs: 1 10*3/uL (ref 0.7–4.0)
MCHC: 34 g/dL (ref 30.0–36.0)
MCV: 88.9 fl (ref 78.0–100.0)
Monocytes Absolute: 0.4 10*3/uL (ref 0.1–1.0)
Monocytes Relative: 7.5 % (ref 3.0–12.0)
Neutro Abs: 3.6 10*3/uL (ref 1.4–7.7)
Neutrophils Relative %: 70.7 % (ref 43.0–77.0)
Platelets: 210 10*3/uL (ref 150.0–400.0)
RBC: 4.54 Mil/uL (ref 3.87–5.11)
RDW: 13.3 % (ref 11.5–15.5)
WBC: 5.1 10*3/uL (ref 4.0–10.5)

## 2016-12-24 LAB — COMPREHENSIVE METABOLIC PANEL
ALT: 21 U/L (ref 0–35)
AST: 22 U/L (ref 0–37)
Albumin: 4.3 g/dL (ref 3.5–5.2)
Alkaline Phosphatase: 89 U/L (ref 39–117)
BUN: 11 mg/dL (ref 6–23)
CO2: 33 mEq/L — ABNORMAL HIGH (ref 19–32)
Calcium: 9.8 mg/dL (ref 8.4–10.5)
Chloride: 100 mEq/L (ref 96–112)
Creatinine, Ser: 0.73 mg/dL (ref 0.40–1.20)
GFR: 83.25 mL/min (ref >60.00)
Glucose, Bld: 102 mg/dL — ABNORMAL HIGH (ref 70–99)
Potassium: 3.8 mEq/L (ref 3.5–5.1)
Sodium: 140 mEq/L (ref 135–145)
Total Bilirubin: 0.8 mg/dL (ref 0.2–1.2)
Total Protein: 7.2 g/dL (ref 6.0–8.3)

## 2016-12-24 LAB — VITAMIN D 25 HYDROXY (VIT D DEFICIENCY, FRACTURES): VITD: 35.11 ng/mL (ref 30.00–100.00)

## 2016-12-24 LAB — LIPID PANEL
Cholesterol: 163 mg/dL (ref 0–200)
HDL: 43.2 mg/dL (ref >39.00)
LDL Cholesterol: 99 mg/dL (ref 0–99)
NonHDL: 119.41
Total CHOL/HDL Ratio: 4
Triglycerides: 103 mg/dL (ref 0.0–149.0)
VLDL: 20.6 mg/dL (ref 0.0–40.0)

## 2016-12-24 LAB — TSH: TSH: 2.19 u[IU]/mL (ref 0.35–4.50)

## 2016-12-24 LAB — HEMOGLOBIN A1C: Hgb A1c MFr Bld: 5.9 % (ref 4.6–6.5)

## 2016-12-24 MED ORDER — ZOSTER VAC RECOMB ADJUVANTED 50 MCG/0.5ML IM SUSR: 0.5000 mL | Freq: Once | INTRAMUSCULAR | 1 refills | Status: AC

## 2016-12-24 MED ORDER — RANITIDINE HCL 300 MG PO TABS: 300.0000 mg | ORAL_TABLET | Freq: Every day | ORAL | 3 refills | Status: DC

## 2016-12-24 NOTE — Progress Notes (Signed)
Pre visit review using our clinic review tool, if applicable. No additional management support is needed unless otherwise documented below in the visit note. 

## 2016-12-24 NOTE — Patient Instructions (Signed)
Health Maintenance, Female Adopting a healthy lifestyle and getting preventive care can go a long way to promote health and wellness. Talk with your health care provider about what schedule of regular examinations is right for you. This is a good chance for you to check in with your provider about disease prevention and staying healthy. In between checkups, there are plenty of things you can do on your own. Experts have done a lot of research about which lifestyle changes and preventive measures are most likely to keep you healthy. Ask your health care provider for more information. Weight and diet Eat a healthy diet  Be sure to include plenty of vegetables, fruits, low-fat dairy products, and lean protein.  Do not eat a lot of foods high in solid fats, added sugars, or salt.  Get regular exercise. This is one of the most important things you can do for your health.  Most adults should exercise for at least 150 minutes each week. The exercise should increase your heart rate and make you sweat (moderate-intensity exercise).  Most adults should also do strengthening exercises at least twice a week. This is in addition to the moderate-intensity exercise. Maintain a healthy weight  Body mass index (BMI) is a measurement that can be used to identify possible weight problems. It estimates body fat based on height and weight. Your health care provider can help determine your BMI and help you achieve or maintain a healthy weight.  For females 76 years of age and older:  A BMI below 18.5 is considered underweight.  A BMI of 18.5 to 24.9 is normal.  A BMI of 25 to 29.9 is considered overweight.  A BMI of 30 and above is considered obese. Watch levels of cholesterol and blood lipids  You should start having your blood tested for lipids and cholesterol at 72 years of age, then have this test every 5 years.  You may need to have your cholesterol levels checked more often if:  Your lipid or  cholesterol levels are high.  You are older than 72 years of age.  You are at high risk for heart disease. Cancer screening Lung Cancer  Lung cancer screening is recommended for adults 64-42 years old who are at high risk for lung cancer because of a history of smoking.  A yearly low-dose CT scan of the lungs is recommended for people who:  Currently smoke.  Have quit within the past 15 years.  Have at least a 30-pack-year history of smoking. A pack year is smoking an average of one pack of cigarettes a day for 1 year.  Yearly screening should continue until it has been 15 years since you quit.  Yearly screening should stop if you develop a health problem that would prevent you from having lung cancer treatment. Breast Cancer  Practice breast self-awareness. This means understanding how your breasts normally appear and feel.  It also means doing regular breast self-exams. Let your health care provider know about any changes, no matter how small.  If you are in your 20s or 30s, you should have a clinical breast exam (CBE) by a health care provider every 1-3 years as part of a regular health exam.  If you are 34 or older, have a CBE every year. Also consider having a breast X-ray (mammogram) every year.  If you have a family history of breast cancer, talk to your health care provider about genetic screening.  If you are at high risk for breast cancer, talk  to your health care provider about having an MRI and a mammogram every year.  Breast cancer gene (BRCA) assessment is recommended for women who have family members with BRCA-related cancers. BRCA-related cancers include:  Breast.  Ovarian.  Tubal.  Peritoneal cancers.  Results of the assessment will determine the need for genetic counseling and BRCA1 and BRCA2 testing. Cervical Cancer  Your health care provider may recommend that you be screened regularly for cancer of the pelvic organs (ovaries, uterus, and vagina).  This screening involves a pelvic examination, including checking for microscopic changes to the surface of your cervix (Pap test). You may be encouraged to have this screening done every 3 years, beginning at age 24.  For women ages 66-65, health care providers may recommend pelvic exams and Pap testing every 3 years, or they may recommend the Pap and pelvic exam, combined with testing for human papilloma virus (HPV), every 5 years. Some types of HPV increase your risk of cervical cancer. Testing for HPV may also be done on women of any age with unclear Pap test results.  Other health care providers may not recommend any screening for nonpregnant women who are considered low risk for pelvic cancer and who do not have symptoms. Ask your health care provider if a screening pelvic exam is right for you.  If you have had past treatment for cervical cancer or a condition that could lead to cancer, you need Pap tests and screening for cancer for at least 20 years after your treatment. If Pap tests have been discontinued, your risk factors (such as having a new sexual partner) need to be reassessed to determine if screening should resume. Some women have medical problems that increase the chance of getting cervical cancer. In these cases, your health care provider may recommend more frequent screening and Pap tests. Colorectal Cancer  This type of cancer can be detected and often prevented.  Routine colorectal cancer screening usually begins at 72 years of age and continues through 72 years of age.  Your health care provider may recommend screening at an earlier age if you have risk factors for colon cancer.  Your health care provider may also recommend using home test kits to check for hidden blood in the stool.  A small camera at the end of a tube can be used to examine your colon directly (sigmoidoscopy or colonoscopy). This is done to check for the earliest forms of colorectal cancer.  Routine  screening usually begins at age 41.  Direct examination of the colon should be repeated every 5-10 years through 72 years of age. However, you may need to be screened more often if early forms of precancerous polyps or small growths are found. Skin Cancer  Check your skin from head to toe regularly.  Tell your health care provider about any new moles or changes in moles, especially if there is a change in a mole's shape or color.  Also tell your health care provider if you have a mole that is larger than the size of a pencil eraser.  Always use sunscreen. Apply sunscreen liberally and repeatedly throughout the day.  Protect yourself by wearing long sleeves, pants, a wide-brimmed hat, and sunglasses whenever you are outside. Heart disease, diabetes, and high blood pressure  High blood pressure causes heart disease and increases the risk of stroke. High blood pressure is more likely to develop in:  People who have blood pressure in the high end of the normal range (130-139/85-89 mm Hg).  People who are overweight or obese.  People who are African American.  If you are 59-24 years of age, have your blood pressure checked every 3-5 years. If you are 34 years of age or older, have your blood pressure checked every year. You should have your blood pressure measured twice-once when you are at a hospital or clinic, and once when you are not at a hospital or clinic. Record the average of the two measurements. To check your blood pressure when you are not at a hospital or clinic, you can use:  An automated blood pressure machine at a pharmacy.  A home blood pressure monitor.  If you are between 29 years and 60 years old, ask your health care provider if you should take aspirin to prevent strokes.  Have regular diabetes screenings. This involves taking a blood sample to check your fasting blood sugar level.  If you are at a normal weight and have a low risk for diabetes, have this test once  every three years after 72 years of age.  If you are overweight and have a high risk for diabetes, consider being tested at a younger age or more often. Preventing infection Hepatitis B  If you have a higher risk for hepatitis B, you should be screened for this virus. You are considered at high risk for hepatitis B if:  You were born in a country where hepatitis B is common. Ask your health care provider which countries are considered high risk.  Your parents were born in a high-risk country, and you have not been immunized against hepatitis B (hepatitis B vaccine).  You have HIV or AIDS.  You use needles to inject street drugs.  You live with someone who has hepatitis B.  You have had sex with someone who has hepatitis B.  You get hemodialysis treatment.  You take certain medicines for conditions, including cancer, organ transplantation, and autoimmune conditions. Hepatitis C  Blood testing is recommended for:  Everyone born from [birth year] through 1966.  Anyone with known risk factors for hepatitis C. Sexually transmitted infections (STIs)  You should be screened for sexually transmitted infections (STIs) including gonorrhea and chlamydia if:  You are sexually active and are younger than 72 years of age.  You are older than 72 years of age and your health care provider tells you that you are at risk for this type of infection.  Your sexual activity has changed since you were last screened and you are at an increased risk for chlamydia or gonorrhea. Ask your health care provider if you are at risk.  If you do not have HIV, but are at risk, it may be recommended that you take a prescription medicine daily to prevent HIV infection. This is called pre-exposure prophylaxis (PrEP). You are considered at risk if:  You are sexually active and do not regularly use condoms or know the HIV status of your partner(s).  You take drugs by injection.  You are sexually active with a partner  who has HIV. Talk with your health care provider about whether you are at high risk of being infected with HIV. If you choose to begin PrEP, you should first be tested for HIV. You should then be tested every 3 months for as long as you are taking PrEP. Pregnancy  If you are premenopausal and you may become pregnant, ask your health care provider about preconception counseling.  If you may become pregnant, take 400 to 800 micrograms (mcg) of folic acid  every day.  If you want to prevent pregnancy, talk to your health care provider about birth control (contraception). Osteoporosis and menopause  Osteoporosis is a disease in which the bones lose minerals and strength with aging. This can result in serious bone fractures. Your risk for osteoporosis can be identified using a bone density scan.  If you are 4 years of age or older, or if you are at risk for osteoporosis and fractures, ask your health care provider if you should be screened.  Ask your health care provider whether you should take a calcium or vitamin D supplement to lower your risk for osteoporosis.  Menopause may have certain physical symptoms and risks.  Hormone replacement therapy may reduce some of these symptoms and risks. Talk to your health care provider about whether hormone replacement therapy is right for you. Follow these instructions at home:  Schedule regular health, dental, and eye exams.  Stay current with your immunizations.  Do not use any tobacco products including cigarettes, chewing tobacco, or electronic cigarettes.  If you are pregnant, do not drink alcohol.  If you are breastfeeding, limit how much and how often you drink alcohol.  Limit alcohol intake to no more than 1 drink per day for nonpregnant women. One drink equals 12 ounces of beer, 5 ounces of wine, or 1 ounces of hard liquor.  Do not use street drugs.  Do not share needles.  Ask your health care provider for help if you need support  or information about quitting drugs.  Tell your health care provider if you often feel depressed.  Tell your health care provider if you have ever been abused or do not feel safe at home. This information is not intended to replace advice given to you by your health care provider. Make sure you discuss any questions you have with your health care provider. Document Released: 02/26/2011 Document Revised: 01/19/2016 Document Reviewed: 05/17/2015 [employer] Interactive Patient Education  2017 [employer].

## 2016-12-24 NOTE — Progress Notes (Addendum)
Subjective:  Patient ID: Colleen White, female    DOB: [birth year]  Age: 71 y.o. MRN: [id number]  CC: Annual Exam; Hypertension; and Hyperlipidemia   HPI [name] presents for a CPX.  She tells me that her BP has been well controlled. She feels well and offers no complaints. She is concerned that her PPI may cause dementia and wants to take a different medication for GERD.  Past Medical History:  Diagnosis Date  . Allergic rhinitis, cause unspecified   . Alopecia   . Anxiety   . Colon polyps   . Diverticulosis of colon (without mention of hemorrhage)   . Fibrocystic breast disease   . GERD (gastroesophageal reflux disease)   . Headache(784.0)   . Hemorrhoids   . History of echocardiogram    Echo 3/17:  Vigorous LVF, EF 65-70%, normal wall motion, grade 1 diastolic, mildly elevated LVOT velocity of 2.2 m/s-likely related to vigorous LVF  . History of stress test    Myoview normal in 2/05. //  Stress echo (11/15) with no evidence for ischemia or infarction.  [name] HLD (hyperlipidemia)   . HTN (hypertension)   . Hypertension   . Mitral valve disorders(424.0)   . Non Hodgkin's lymphoma ([place])    Treated at [facility] with surgery and XRT.  Did not have chemotherapy.   . OSA (obstructive sleep apnea) 03/29/2016  . Osteopenia   . PAF (paroxysmal atrial fibrillation) ([place])    a. CHADS2-VASc=3 //  b. Xarelto started in 2017  . Pulmonary nodule    Presumed benign after observation.   [name] Unspecified glaucoma(365.9)    Past Surgical History:  Procedure Laterality Date  . ABDOMINAL HYSTERECTOMY  1996  . EYE SURGERY  2009   Cataract  . 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 Cloyd Stagers, [facility]  . s/p ORIF prox phalanx  02/2007   right little finger----Dr Fredna Dow  . s/p TAH/BSO      reports that she quit smoking about 42 years ago. She has never used smokeless tobacco. She reports that she does not drink alcohol or use drugs. family history includes Cancer in her  father and sister; Hypertension in her mother. No Known Allergies  Past Medical History:  Diagnosis Date  . Allergic rhinitis, cause unspecified   . Alopecia   . Anxiety   . Colon polyps   . Diverticulosis of colon (without mention of hemorrhage)   . Fibrocystic breast disease   . GERD (gastroesophageal reflux disease)   . Headache(784.0)   . Hemorrhoids   . History of echocardiogram    Echo 3/17:  Vigorous LVF, EF 65-70%, normal wall motion, grade 1 diastolic, mildly elevated LVOT velocity of 2.2 m/s-likely related to vigorous LVF  . History of stress test    Myoview normal in 2/05. //  Stress echo (11/15) with no evidence for ischemia or infarction.  [name] HLD (hyperlipidemia)   . HTN (hypertension)   . Hypertension   . Mitral valve disorders(424.0)   . Non Hodgkin's lymphoma ([place])    Treated at [facility] with surgery and XRT.  Did not have chemotherapy.   . OSA (obstructive sleep apnea) 03/29/2016  . Osteopenia   . PAF (paroxysmal atrial fibrillation) ([place])    a. CHADS2-VASc=3 //  b. Xarelto started in 2017  . Pulmonary nodule    Presumed benign after observation.   [name] Unspecified glaucoma(365.9)    Past Surgical History:  Procedure Laterality Date  . ABDOMINAL HYSTERECTOMY  1996  .  EYE SURGERY  2009   Cataract  . s/p ELap----follicular lymphoma  51/0258   4cm mesenteric mass, Dr. Cloyd Stagers, [facility]  . s/p ORIF prox phalanx  02/2007   right little finger----Dr Fredna Dow  . s/p TAH/BSO      reports that she quit smoking about 42 years ago. She has never used smokeless tobacco. She reports that she does not drink alcohol or use drugs. family history includes Cancer in her father and sister; Hypertension in her mother. No Known Allergies  Outpatient Medications Prior to Visit  Medication Sig Dispense Refill  . brimonidine-timolol (COMBIGAN) 0.2-0.5 % ophthalmic solution Place 1 drop into both eyes every 12 (twelve) hours.      . Calcium Carbonate-Vitamin D (CALCIUM 500 + D PO) Take 1 tablet  by mouth 2 (two) times daily.    . cholecalciferol (VITAMIN D) 1000 UNITS tablet Take 1,000 Units by mouth daily.      [name] diltiazem (CARDIZEM CD) 120 MG 24 hr capsule Take 1 capsule (120 mg total) by mouth daily. 30 capsule 6  . docusate sodium (COLACE) 100 MG capsule Take 100 mg by mouth every evening. Take once daily     . loratadine (CLARITIN) 10 MG tablet Take 10 mg by mouth daily as needed for allergies. As needed     . Minoxidil 5 % FOAM Apply 3-5 mLs topically at bedtime. Apply to Scalp for hair loss at bedtime     . mirtazapine (REMERON) 7.5 MG tablet Take 2.5 mg by mouth at bedtime as needed (for scalp itching).    . Multiple Vitamins-Minerals (PRESERVISION AREDS 2 PO) Take 1 tablet by mouth 2 (two) times daily.     . pravastatin (PRAVACHOL) 40 MG tablet TAKE 1 TABLET ONCE DAILY. 90 tablet 3  . Probiotic Product (PROBIOTIC DAILY PO) Take 1 tablet by mouth daily.    [name] telmisartan-hydrochlorothiazide (MICARDIS HCT) 80-12.5 MG tablet Take 1 tablet by mouth daily. 90 tablet 0  . traMADol (ULTRAM) 50 MG tablet TAKE (1) TABLET TWICE A DAY AS NEEDED. 60 tablet 3  . tretinoin (RETIN-A) 0.05 % cream Once daily     . vitamin C (ASCORBIC ACID) 500 MG tablet Take 500 mg by mouth daily.    Alveda Reasons 20 MG TABS tablet TAKE 1 TABLET DAILY WITH SUPPER. 30 tablet 5  . Coenzyme Q10 (CO Q 10) 100 MG CAPS Take 1 capsule by mouth daily.    [name] esomeprazole (NEXIUM) 40 MG capsule Take 1 capsule (40 mg total) by mouth daily. 30 minutes before the evening meal 30 capsule 6   No facility-administered medications prior to visit.     ROS Review of Systems  Constitutional: Negative for appetite change, diaphoresis, fatigue, fever and unexpected weight change.  HENT: Negative.  Negative for trouble swallowing.   Eyes: Negative.  Negative for visual disturbance.  Respiratory: Negative.  Negative for cough, chest tightness, shortness of breath and wheezing.   Cardiovascular: Negative.  Negative for chest pain,  palpitations and leg swelling.  Gastrointestinal: Negative.  Negative for abdominal pain, constipation, diarrhea, nausea and vomiting.  Endocrine: Negative.   Genitourinary: Negative.   Musculoskeletal: Negative.  Negative for back pain, myalgias and neck pain.  Skin: Negative.  Negative for color change and rash.    Objective:  BP 108/60 (BP Location: Left Arm, Patient Position: Sitting, Cuff Size: Normal)   Pulse 62   Temp 97.4 F (36.3 C) (Oral)   Resp 16   Ht 5\' 3"  (1.6 m)  Wt 160 lb 8 oz (72.8 kg)   SpO2 98%   BMI 28.43 kg/m   BP Readings from Last 3 Encounters:  12/24/16 108/60  10/20/16 108/64  05/30/16 124/74    Wt Readings from Last 3 Encounters:  12/24/16 160 lb 8 oz (72.8 kg)  10/20/16 165 lb (74.8 kg)  05/30/16 168 lb (76.2 kg)    Physical Exam  Constitutional: She is oriented to person, place, and time. No distress.  HENT:  Mouth/Throat: Oropharynx is clear and moist. No oropharyngeal exudate.  Eyes: Conjunctivae are normal. Right eye exhibits no discharge. Left eye exhibits no discharge. No scleral icterus.  Neck: Normal range of motion. Neck supple. No JVD present. No tracheal deviation present. No thyromegaly present.  Cardiovascular: Normal rate, regular rhythm, normal heart sounds and intact distal pulses.  Exam reveals no gallop and no friction rub.   No murmur heard. Pulmonary/Chest: Effort normal and breath sounds normal. No stridor. No respiratory distress. She has no wheezes. She has no rales. She exhibits no tenderness.  Abdominal: Soft. Bowel sounds are normal. She exhibits no distension and no mass. There is no tenderness. There is no rebound and no guarding.  Musculoskeletal: Normal range of motion. She exhibits no edema, tenderness or deformity.  Lymphadenopathy:    She has no cervical adenopathy.  Neurological: She is oriented to person, place, and time.  Skin: Skin is warm and dry. No rash noted. She is not diaphoretic. No erythema. No  pallor.  Psychiatric: She has a normal mood and affect. Her behavior is normal. Judgment and thought content normal.  Vitals reviewed.   Lab Results  Component Value Date   WBC 5.1 12/24/2016   HGB 13.7 12/24/2016   HCT 40.4 12/24/2016   PLT 210.0 12/24/2016   GLUCOSE 102 (H) 12/24/2016   GLUCOSE 102 (H) 12/24/2016   CHOL 163 12/24/2016   TRIG 103.0 12/24/2016   HDL 43.20 12/24/2016   LDLDIRECT 142.5 09/30/2012   LDLCALC 99 12/24/2016   ALT 21 12/24/2016   AST 22 12/24/2016   NA 140 12/24/2016   NA 140 12/24/2016   K 3.8 12/24/2016   K 3.8 12/24/2016   CL 100 12/24/2016   CL 100 12/24/2016   CREATININE 0.73 12/24/2016   CREATININE 0.73 12/24/2016   BUN 11 12/24/2016   BUN 11 12/24/2016   CO2 33 (H) 12/24/2016   CO2 33 (H) 12/24/2016   TSH 2.19 12/24/2016   HGBA1C 5.9 12/24/2016    Dg Ribs Unilateral W/chest Right  Result Date: 04/02/2016 CLINICAL DATA:  Injury to the wrist and hips today. Patient is on blood thinner. Fell on a concrete planter with her right ribs. Pain below diaphragm anterior ribs. Three areas marked. Heard a snap the. EXAM: RIGHT RIBS AND CHEST - 3+ VIEW COMPARISON:  12/21/2015. FINDINGS: Heart size is normal. Lungs are clear. No pneumothorax. Multiple oblique views are performed, showing a nondisplaced fracture of the right lateral sixth rib. IMPRESSION: 1.  No evidence for acute cardiopulmonary abnormality. 2. Fracture of the right lateral sixth rib. Electronically Signed   By: Nolon Nations M.D.   On: 04/02/2016 16:38   Ct Head Wo Contrast  Result Date: 04/02/2016 CLINICAL DATA:  Hit head on bird feeder now with pain involving the right side of the head. EXAM: CT HEAD WITHOUT CONTRAST CT CERVICAL SPINE WITHOUT CONTRAST TECHNIQUE: Multidetector CT imaging of the head and cervical spine was performed following the standard protocol without intravenous contrast. Multiplanar CT image reconstructions of  the cervical spine were also generated. COMPARISON:   Head CT- 07/24/2010 FINDINGS: CT HEAD FINDINGS Regional soft tissues appear normal.  No radiopaque foreign body. No displaced calvarial fracture. Note is made of mild hyperostosis frontalis. Mild atrophy with sulcal prominence and prominence of the bifrontal extra-axial spaces. The gray-white differentiation is maintained without CT evidence of acute large territory infarct. No intraparenchymal or extra-axial mass or hemorrhage. Normal size and configuration of the ventricles and the basilar cisterns. No midline shift. Small air-fluid level seen within the right sphenoid sinus. The remaining paranasal sinuses and mastoid air cells are normally aerated. CT CERVICAL SPINE FINDINGS C1 to the superior endplate of T1 is imaged. Normal alignment of the cervical spine. No anterolisthesis or retrolisthesis. The dens is normally positioned between the lateral masses of C1. Mild degenerative change involving the atlantodental articulation. Note is made of a approximately 1.5 x 0.5 x 0.9 cm ossicle adjacent to the cranial aspect of the anterior arch of C1, likely the sequela of prior avulsive injury. Normal atlantoaxial articulations. No fracture or static subluxation of the cervical spine. Cervical vertebral body heights are preserved. Prevertebral soft tissues are normal. Mild multilevel cervical spine DDD, worse at C5-C6 and C6-C7 with disc space height loss, endplate irregularity and sclerosis. No bulky cervical lymphadenopathy on this noncontrast examination. Normal noncontrast appearance of the thyroid gland. IMPRESSION: 1. Mild atrophy without acute intracranial process. 2. No fracture or static subluxation of the cervical spine. 3. Mild multilevel cervical spine DDD. 4. Small air-fluid level within the right sphenoid sinus, nonspecific though could be indicative of acute sinusitis. Clinical correlation is advised. Electronically Signed   By: Sandi Mariscal M.D.   On: 04/02/2016 16:13   Ct Chest W Contrast  Result  Date: 04/02/2016 CLINICAL DATA:  Status post fall to the right side of the body, with pain. Initial encounter. EXAM: CT CHEST, ABDOMEN, AND PELVIS WITH CONTRAST TECHNIQUE: Multidetector CT imaging of the chest, abdomen and pelvis was performed following the standard protocol during bolus administration of intravenous contrast. CONTRAST:  186mL ISOVUE-300 IOPAMIDOL (ISOVUE-300) INJECTION 61% COMPARISON:  CT of the abdomen and pelvis from 08/14/2010, and MRI of the lumbar spine performed 07/22/2013 FINDINGS: CT CHEST A 7 mm pleural-based nodule is noted along the right lower lobe (image 64 of 127), and a 5 mm nodule is noted along the left major fissure (image 64 of 127). The nodule along the fissure may simply reflect a lymph node. Mild bibasilar atelectasis is noted. No pleural effusion or pneumothorax is seen. The mediastinum is unremarkable in appearance. No mediastinal lymphadenopathy is seen. No pericardial effusion is identified. The great vessels are grossly unremarkable in appearance. The visualized portions of thyroid gland are unremarkable. No axillary lymphadenopathy is seen. There is no evidence of significant soft tissue injury along the chest wall. No acute osseous abnormalities are identified. CT ABDOMEN AND PELVIS No free air or free fluid is seen within the abdomen or pelvis. There is no evidence of solid or hollow organ injury. The liver and spleen are unremarkable in appearance. The gallbladder is within normal limits. The pancreas and adrenal glands are unremarkable. The kidneys are unremarkable in appearance. There is no evidence of hydronephrosis. No renal or ureteral stones are seen. No perinephric stranding is appreciated. No free fluid is identified. The small bowel is unremarkable in appearance. The stomach is within normal limits. No acute vascular abnormalities are seen. Scattered calcification is noted along the abdominal aorta and its branches. The appendix is normal  in caliber, without  evidence of appendicitis. Scattered diverticulosis is noted along the sigmoid colon, without evidence of diverticulitis. 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 There is no evidence of recurrence at this time. The bladder is moderately distended and grossly unremarkable. The patient is status post hysterectomy. No suspicious adnexal masses are seen. No inguinal lymphadenopathy is seen. Prominent superficial varices are noted anterior to the right hip, and along the right flank. No acute osseous abnormalities are identified. Underlying facet disease is noted along the lower lumbar spine. IMPRESSION: 1. No evidence of traumatic injury to the chest, abdomen or pelvis. 2. Prominent superficial varices anterior to the right hip, and along the right flank. 3. Mild bibasilar atelectasis noted. 4. 7 mm pleural based nodule along the right lower lung lobe. Non-contrast chest CT at 6-12 months is recommended. If the nodule is stable at time of repeat CT, then future CT at 18-24 months (from today's scan) is considered optional for low-risk patients, but is recommended for high-risk patients. This recommendation follows the consensus statement: Guidelines for Management of Incidental Pulmonary Nodules Detected on CT Images:From the Fleischner Society 2017; published online before print (10.1148/[id number]). 5. Scattered calcification along the abdominal aorta and its branches. 6. Scattered diverticulosis along the sigmoid colon, without evidence of diverticulitis. Electronically Signed   By: Garald Balding M.D.   On: 04/02/2016 21:40   Ct Cervical Spine Wo Contrast  Result Date: 04/02/2016 CLINICAL DATA:  Hit head on bird feeder now with pain involving the right side of the head. EXAM: CT HEAD WITHOUT CONTRAST CT CERVICAL SPINE WITHOUT CONTRAST TECHNIQUE: Multidetector CT imaging of the head and cervical spine was performed following the standard protocol without intravenous contrast. Multiplanar  CT image reconstructions of the cervical spine were also generated. COMPARISON:  Head CT- 07/24/2010 FINDINGS: CT HEAD FINDINGS Regional soft tissues appear normal.  No radiopaque foreign body. No displaced calvarial fracture. Note is made of mild hyperostosis frontalis. Mild atrophy with sulcal prominence and prominence of the bifrontal extra-axial spaces. The gray-white differentiation is maintained without CT evidence of acute large territory infarct. No intraparenchymal or extra-axial mass or hemorrhage. Normal size and configuration of the ventricles and the basilar cisterns. No midline shift. Small air-fluid level seen within the right sphenoid sinus. The remaining paranasal sinuses and mastoid air cells are normally aerated. CT CERVICAL SPINE FINDINGS C1 to the superior endplate of T1 is imaged. Normal alignment of the cervical spine. No anterolisthesis or retrolisthesis. The dens is normally positioned between the lateral masses of C1. Mild degenerative change involving the atlantodental articulation. Note is made of a approximately 1.5 x 0.5 x 0.9 cm ossicle adjacent to the cranial aspect of the anterior arch of C1, likely the sequela of prior avulsive injury. Normal atlantoaxial articulations. No fracture or static subluxation of the cervical spine. Cervical vertebral body heights are preserved. Prevertebral soft tissues are normal. Mild multilevel cervical spine DDD, worse at C5-C6 and C6-C7 with disc space height loss, endplate irregularity and sclerosis. No bulky cervical lymphadenopathy on this noncontrast examination. Normal noncontrast appearance of the thyroid gland. IMPRESSION: 1. Mild atrophy without acute intracranial process. 2. No fracture or static subluxation of the cervical spine. 3. Mild multilevel cervical spine DDD. 4. Small air-fluid level within the right sphenoid sinus, nonspecific though could be indicative of acute sinusitis. Clinical correlation is advised. Electronically Signed    By: Sandi Mariscal M.D.   On: 04/02/2016 16:13   Ct Abdomen Pelvis W Contrast  Result  Date: 04/02/2016 CLINICAL DATA:  Status post fall to the right side of the body, with pain. Initial encounter. EXAM: CT CHEST, ABDOMEN, AND PELVIS WITH CONTRAST TECHNIQUE: Multidetector CT imaging of the chest, abdomen and pelvis was performed following the standard protocol during bolus administration of intravenous contrast. CONTRAST:  170mL ISOVUE-300 IOPAMIDOL (ISOVUE-300) INJECTION 61% COMPARISON:  CT of the abdomen and pelvis from 08/14/2010, and MRI of the lumbar spine performed 07/22/2013 FINDINGS: CT CHEST A 7 mm pleural-based nodule is noted along the right lower lobe (image 64 of 127), and a 5 mm nodule is noted along the left major fissure (image 64 of 127). The nodule along the fissure may simply reflect a lymph node. Mild bibasilar atelectasis is noted. No pleural effusion or pneumothorax is seen. The mediastinum is unremarkable in appearance. No mediastinal lymphadenopathy is seen. No pericardial effusion is identified. The great vessels are grossly unremarkable in appearance. The visualized portions of thyroid gland are unremarkable. No axillary lymphadenopathy is seen. There is no evidence of significant soft tissue injury along the chest wall. No acute osseous abnormalities are identified. CT ABDOMEN AND PELVIS No free air or free fluid is seen within the abdomen or pelvis. There is no evidence of solid or hollow organ injury. The liver and spleen are unremarkable in appearance. The gallbladder is within normal limits. The pancreas and adrenal glands are unremarkable. The kidneys are unremarkable in appearance. There is no evidence of hydronephrosis. No renal or ureteral stones are seen. No perinephric stranding is appreciated. No free fluid is identified. The small bowel is unremarkable in appearance. The stomach is within normal limits. No acute vascular abnormalities are seen. Scattered calcification is noted  along the abdominal aorta and its branches. The appendix is normal in caliber, without evidence of appendicitis. Scattered diverticulosis is noted along the sigmoid colon, without evidence of diverticulitis. 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 There is no evidence of recurrence at this time. The bladder is moderately distended and grossly unremarkable. The patient is status post hysterectomy. No suspicious adnexal masses are seen. No inguinal lymphadenopathy is seen. Prominent superficial varices are noted anterior to the right hip, and along the right flank. No acute osseous abnormalities are identified. Underlying facet disease is noted along the lower lumbar spine. IMPRESSION: 1. No evidence of traumatic injury to the chest, abdomen or pelvis. 2. Prominent superficial varices anterior to the right hip, and along the right flank. 3. Mild bibasilar atelectasis noted. 4. 7 mm pleural based nodule along the right lower lung lobe. Non-contrast chest CT at 6-12 months is recommended. If the nodule is stable at time of repeat CT, then future CT at 18-24 months (from today's scan) is considered optional for low-risk patients, but is recommended for high-risk patients. This recommendation follows the consensus statement: Guidelines for Management of Incidental Pulmonary Nodules Detected on CT Images:From the Fleischner Society 2017; published online before print (10.1148/[id number]). 5. Scattered calcification along the abdominal aorta and its branches. 6. Scattered diverticulosis along the sigmoid colon, without evidence of diverticulitis. Electronically Signed   By: Garald Balding M.D.   On: 04/02/2016 21:40    Assessment & Plan:   Hopie was seen today for annual exam, hypertension and hyperlipidemia.  Diagnoses and all orders for this visit:  Essential hypertension- Her BP is well controlled, lytes and renal fxn are normal -     Basic metabolic panel; Future -     Hemoglobin  A1c; Future  HYPERCHOLESTEROLEMIA- Framingham risk score is  4% so I do not recommend that she start a statin for CV risk reduction -     Lipid panel; Future -     TSH; Future  Hyperglycemia- her A1C is at 5.9%, she is mildly pre-diabetic, no meds are needed at this time -     Hemoglobin A1c; Future  Gastroesophageal reflux disease without esophagitis -     ranitidine (ZANTAC) 300 MG tablet; Take 1 tablet (300 mg total) by mouth at bedtime.  Deficiency anemia- her H/H are normal, will check her Vit B1 and Zinc levels -     CBC with Differential/Platelet; Future -     Vitamin B1; Future -     Zinc; Future  Vitamin D deficiency -     VITAMIN D 25 Hydroxy (Vit-D Deficiency, Fractures); Future  Elevated LFTs- LFT's are normal, Hep C ab is neg -     Comprehensive metabolic panel; Future -     Hepatitis C antibody; Future  Other orders -     Zoster Vac Recomb Adjuvanted [facility]) injection; Inject 0.5 mLs into the muscle once.   I have discontinued Ms. Arnott's esomeprazole and Co Q 10. I am also having her start on Zoster Vac Recomb Adjuvanted and ranitidine. Additionally, I am having her maintain her loratadine, docusate sodium, brimonidine-timolol, cholecalciferol, tretinoin, Minoxidil, Multiple Vitamins-Minerals (PRESERVISION AREDS 2 PO), vitamin C, Probiotic Product (PROBIOTIC DAILY PO), Calcium Carbonate-Vitamin D (CALCIUM 500 + D PO), mirtazapine, pravastatin, traMADol, telmisartan-hydrochlorothiazide, XARELTO, diltiazem, and rivaroxaban.  Meds ordered this encounter  Medications  . rivaroxaban (XARELTO) 20 MG TABS tablet    Sig: Take by mouth.  . Zoster Vac Recomb Adjuvanted [facility]) injection    Sig: Inject 0.5 mLs into the muscle once.    Dispense:  0.5 mL    Refill:  1    Next dose to be given 2-6 months from first one.  . ranitidine (ZANTAC) 300 MG tablet    Sig: Take 1 tablet (300 mg total) by mouth at bedtime.    Dispense:  90 tablet    Refill:  3   See AVS for  instructions about healthy living and anticipatory guidance.  Follow-up: Return in about 6 months (around 06/25/2017).  Scarlette Calico, MD

## 2016-12-25 LAB — HEPATITIS C ANTIBODY: HCV Ab: NEGATIVE

## 2016-12-26 LAB — ZINC: Zinc: 125 ug/dL (ref 60–130)

## 2016-12-28 LAB — VITAMIN B1: Vitamin B1 (Thiamine): 20 nmol/L (ref 8–30)

## 2016-12-29 ENCOUNTER — Encounter: Payer: Self-pay | Admitting: Internal Medicine

## 2016-12-31 ENCOUNTER — Other Ambulatory Visit (INDEPENDENT_AMBULATORY_CARE_PROVIDER_SITE_OTHER): Payer: Medicare Other

## 2016-12-31 ENCOUNTER — Encounter: Payer: Self-pay | Admitting: Internal Medicine

## 2016-12-31 DIAGNOSIS — D539 Nutritional anemia, unspecified: Secondary | ICD-10-CM | POA: Diagnosis not present

## 2016-12-31 DIAGNOSIS — R922 Inconclusive mammogram: Secondary | ICD-10-CM | POA: Diagnosis not present

## 2016-12-31 DIAGNOSIS — R928 Other abnormal and inconclusive findings on diagnostic imaging of breast: Secondary | ICD-10-CM | POA: Diagnosis not present

## 2016-12-31 LAB — HEMOCCULT SLIDES (X 3 CARDS)
Fecal Occult Blood: NEGATIVE
OCCULT 1: NEGATIVE
OCCULT 2: NEGATIVE
OCCULT 3: NEGATIVE
OCCULT 4: NEGATIVE
OCCULT 5: NEGATIVE

## 2016-12-31 NOTE — Addendum Note (Signed)
Addended by: Karle Barr on: 12/31/2016 04:34 PM   Modules accepted: Orders

## 2017-01-02 ENCOUNTER — Other Ambulatory Visit: Payer: Self-pay | Admitting: Internal Medicine

## 2017-01-02 DIAGNOSIS — I10 Essential (primary) hypertension: Secondary | ICD-10-CM

## 2017-04-01 DIAGNOSIS — H04123 Dry eye syndrome of bilateral lacrimal glands: Secondary | ICD-10-CM | POA: Diagnosis not present

## 2017-04-01 DIAGNOSIS — H02403 Unspecified ptosis of bilateral eyelids: Secondary | ICD-10-CM | POA: Diagnosis not present

## 2017-04-01 DIAGNOSIS — Z961 Presence of intraocular lens: Secondary | ICD-10-CM | POA: Diagnosis not present

## 2017-04-01 DIAGNOSIS — H401132 Primary open-angle glaucoma, bilateral, moderate stage: Secondary | ICD-10-CM | POA: Diagnosis not present

## 2017-04-07 ENCOUNTER — Other Ambulatory Visit: Payer: Self-pay | Admitting: Internal Medicine

## 2017-04-07 DIAGNOSIS — F321 Major depressive disorder, single episode, moderate: Secondary | ICD-10-CM | POA: Insufficient documentation

## 2017-04-07 MED ORDER — MIRTAZAPINE 15 MG PO TBDP: 15.0000 mg | ORAL_TABLET | Freq: Every day | ORAL | 1 refills | Status: DC

## 2017-04-10 ENCOUNTER — Ambulatory Visit (INDEPENDENT_AMBULATORY_CARE_PROVIDER_SITE_OTHER): Payer: Medicare Other | Admitting: Internal Medicine

## 2017-04-10 ENCOUNTER — Encounter: Payer: Self-pay | Admitting: Internal Medicine

## 2017-04-10 VITALS — BP 132/72 | HR 65 | Temp 98.2°F | Resp 16 | Ht 63.0 in | Wt 165.0 lb

## 2017-04-10 DIAGNOSIS — I10 Essential (primary) hypertension: Secondary | ICD-10-CM

## 2017-04-10 DIAGNOSIS — L219 Seborrheic dermatitis, unspecified: Secondary | ICD-10-CM | POA: Insufficient documentation

## 2017-04-10 MED ORDER — DOXEPIN HCL 10 MG PO CAPS: 10.0000 mg | ORAL_CAPSULE | Freq: Every day | ORAL | 1 refills | Status: DC

## 2017-04-10 MED ORDER — FLUOCINOLONE ACETONIDE 0.01 % EX SOLN: Freq: Two times a day (BID) | CUTANEOUS | 5 refills | Status: DC

## 2017-04-10 NOTE — Patient Instructions (Signed)
Seborrheic Dermatitis, Adult Seborrheic dermatitis is a skin disease that causes red, scaly patches. It usually occurs on the scalp, and it is often called dandruff. The patches may appear on other parts of the body. Skin patches tend to appear where there are many oil glands in the skin. Areas of the body that are commonly affected include:  Scalp.  Skin folds of the body.  Ears.  Eyebrows.  Neck.  Face.  Armpits.  The bearded area of men's faces.  The condition may come and go for no known reason, and it is often long-lasting (chronic). What are the causes? The cause of this condition is not known. What increases the risk? This condition is more likely to develop in people who:  Have certain conditions, such as: ? HIV (human immunodeficiency virus). ? AIDS (acquired immunodeficiency syndrome). ? Parkinson disease. ? Mood disorders, such as depression.  Are 40-60 years old.  What are the signs or symptoms? Symptoms of this condition include:  Thick scales on the scalp.  Redness on the face or in the armpits.  Skin that is flaky. The flakes may be white or yellow.  Skin that seems oily or dry but is not helped with moisturizers.  Itching or burning in the affected areas.  How is this diagnosed? This condition is diagnosed with a medical history and physical exam. A sample of your skin may be tested (skin biopsy). You may need to see a skin specialist (dermatologist). How is this treated? There is no cure for this condition, but treatment can help to manage the symptoms. You may get treatment to remove scales, lower the risk of skin infection, and reduce swelling or itching. Treatment may include:  Creams that reduce swelling and irritation (steroids).  Creams that reduce skin yeast.  Medicated shampoo, soaps, moisturizing creams, or ointments.  Medicated moisturizing creams or ointments.  Follow these instructions at home:  Apply over-the-counter and  prescription medicines only as told by your health care provider.  Use any medicated shampoo, soaps, skin creams, or ointments only as told by your health care provider.  Keep all follow-up visits as told by your health care provider. This is important. Contact a health care provider if:  Your symptoms do not improve with treatment.  Your symptoms get worse.  You have new symptoms. This information is not intended to replace advice given to you by your health care provider. Make sure you discuss any questions you have with your health care provider. Document Released: 08/13/2005 Document Revised: 03/02/2016 Document Reviewed: 12/01/2015 Elsevier Interactive Patient Education  2018 [employer] Inc[employer]  

## 2017-04-10 NOTE — Progress Notes (Signed)
Subjective:  Patient ID: Colleen White, female    DOB: 07/02/1946  Age: 71 y.o. MRN: [id number]  CC: Rash and Hypertension   HPI Colleen White presents for concerns about an itchy scalp. The symptoms started about 14 years ago. She tells me she was seen by dermatologist at [facility] and had a biopsy done of her scalp. It was unremarkable. She was told that she had sensitive nerve endings in her scalp and was referred to somebody who placed her on mirtazapine which has helped in the past but the sx's worsened recently. She has also been using a topical steroid but tells me that the itching and irritation on her scalp persist. She does not see a rash.  Outpatient Medications Prior to Visit  Medication Sig Dispense Refill  . brimonidine-timolol (COMBIGAN) 0.2-0.5 % ophthalmic solution Place 1 drop into both eyes every 12 (twelve) hours.      . Calcium Carbonate-Vitamin D (CALCIUM 500 + D PO) Take 1 tablet by mouth 2 (two) times daily.    . cholecalciferol (VITAMIN D) 1000 UNITS tablet Take 1,000 Units by mouth daily.      [name] diltiazem (CARDIZEM CD) 120 MG 24 hr capsule Take 1 capsule (120 mg total) by mouth daily. 30 capsule 6  . docusate sodium (COLACE) 100 MG capsule Take 100 mg by mouth every evening. Take once daily     . Minoxidil 5 % FOAM Apply 3-5 mLs topically at bedtime. Apply to Scalp for hair loss at bedtime     . Multiple Vitamins-Minerals (PRESERVISION AREDS 2 PO) Take 1 tablet by mouth 2 (two) times daily.     . pravastatin (PRAVACHOL) 40 MG tablet TAKE 1 TABLET ONCE DAILY. 90 tablet 3  . Probiotic Product (PROBIOTIC DAILY PO) Take 1 tablet by mouth daily.    . ranitidine (ZANTAC) 300 MG tablet Take 1 tablet (300 mg total) by mouth at bedtime. 90 tablet 3  . rivaroxaban (XARELTO) 20 MG TABS tablet Take by mouth.    . telmisartan-hydrochlorothiazide (MICARDIS HCT) 80-12.5 MG tablet TAKE 1 TABLET ONCE DAILY. 30 tablet 5  . traMADol (ULTRAM) 50 MG tablet TAKE (1)  TABLET TWICE A DAY AS NEEDED. 60 tablet 3  . tretinoin (RETIN-A) 0.05 % cream Once daily     . vitamin C (ASCORBIC ACID) 500 MG tablet Take 500 mg by mouth daily.    [name] loratadine (CLARITIN) 10 MG tablet Take 10 mg by mouth daily as needed for allergies. As needed     . mirtazapine (REMERON SOL-TAB) 15 MG disintegrating tablet Take 1 tablet (15 mg total) by mouth at bedtime. 90 tablet 1  . XARELTO 20 MG TABS tablet TAKE 1 TABLET DAILY WITH SUPPER. 30 tablet 5   No facility-administered medications prior to visit.     ROS Review of Systems  Constitutional: Negative.   HENT: Negative.   Eyes: Negative.  Negative for visual disturbance.  Respiratory: Negative for chest tightness, shortness of breath and wheezing.   Cardiovascular: Negative for palpitations and leg swelling.  Gastrointestinal: Negative for abdominal pain, constipation, diarrhea, nausea and vomiting.  Endocrine: Negative.   Genitourinary: Negative.   Musculoskeletal: Negative.  Negative for back pain and myalgias.  Skin: Negative for color change and rash.  Allergic/Immunologic: Negative.   Neurological: Negative.  Negative for dizziness, weakness and light-headedness.  Hematological: Negative for adenopathy. Does not bruise/bleed easily.  Psychiatric/Behavioral: Negative.     Objective:  BP 132/72 (BP Location: Left Arm, Patient Position: Sitting,  Cuff Size: Normal)   Pulse 65   Temp 98.2 F (36.8 C) (Oral)   Resp 16   Ht 5\' 3"  (1.6 m)   Wt 165 lb (74.8 kg)   SpO2 98%   BMI 29.23 kg/m   BP Readings from Last 3 Encounters:  04/10/17 132/72  12/24/16 108/60  10/20/16 108/64    Wt Readings from Last 3 Encounters:  04/10/17 165 lb (74.8 kg)  12/24/16 160 lb 8 oz (72.8 kg)  10/20/16 165 lb (74.8 kg)    Physical Exam  Constitutional: She is oriented to person, place, and time. No distress.  HENT:  Mouth/Throat: Oropharynx is clear and moist. No oropharyngeal exudate.  Eyes: Conjunctivae are normal. Right  eye exhibits no discharge. Left eye exhibits no discharge. No scleral icterus.  Neck: Normal range of motion. Neck supple. No JVD present. No thyromegaly present.  Cardiovascular: Normal rate, regular rhythm and intact distal pulses.  Exam reveals no gallop and no friction rub.   No murmur heard. Pulmonary/Chest: Effort normal and breath sounds normal. No respiratory distress. She has no wheezes. She has no rales. She exhibits no tenderness.  Abdominal: Soft. Bowel sounds are normal. She exhibits no distension and no mass. There is no tenderness. There is no rebound and no guarding.  Musculoskeletal: Normal range of motion. She exhibits no edema or tenderness.  Neurological: She is alert and oriented to person, place, and time.  Skin: Skin is warm and dry. No rash noted. She is not diaphoretic. No erythema. No pallor.  No rash noted on scalp  Psychiatric: She has a normal mood and affect. Her behavior is normal. Judgment and thought content normal.  Vitals reviewed.   Lab Results  Component Value Date   WBC 5.1 12/24/2016   HGB 13.7 12/24/2016   HCT 40.4 12/24/2016   PLT 210.0 12/24/2016   GLUCOSE 102 (H) 12/24/2016   GLUCOSE 102 (H) 12/24/2016   CHOL 163 12/24/2016   TRIG 103.0 12/24/2016   HDL 43.20 12/24/2016   LDLDIRECT 142.5 09/30/2012   LDLCALC 99 12/24/2016   ALT 21 12/24/2016   AST 22 12/24/2016   NA 140 12/24/2016   NA 140 12/24/2016   K 3.8 12/24/2016   K 3.8 12/24/2016   CL 100 12/24/2016   CL 100 12/24/2016   CREATININE 0.73 12/24/2016   CREATININE 0.73 12/24/2016   BUN 11 12/24/2016   BUN 11 12/24/2016   CO2 33 (H) 12/24/2016   CO2 33 (H) 12/24/2016   TSH 2.19 12/24/2016   HGBA1C 5.9 12/24/2016    Dg Ribs Unilateral W/chest Right  Result Date: 04/02/2016 CLINICAL DATA:  Injury to the wrist and hips today. Patient is on blood thinner. Fell on a concrete planter with her right ribs. Pain below diaphragm anterior ribs. Three areas marked. Heard a snap the.  EXAM: RIGHT RIBS AND CHEST - 3+ VIEW COMPARISON:  12/21/2015. FINDINGS: Heart size is normal. Lungs are clear. No pneumothorax. Multiple oblique views are performed, showing a nondisplaced fracture of the right lateral sixth rib. IMPRESSION: 1.  No evidence for acute cardiopulmonary abnormality. 2. Fracture of the right lateral sixth rib. Electronically Signed   By: Nolon Nations M.D.   On: 04/02/2016 16:38   Ct Head Wo Contrast  Result Date: 04/02/2016 CLINICAL DATA:  Hit head on bird feeder now with pain involving the right side of the head. EXAM: CT HEAD WITHOUT CONTRAST CT CERVICAL SPINE WITHOUT CONTRAST TECHNIQUE: Multidetector CT imaging of the head and cervical  spine was performed following the standard protocol without intravenous contrast. Multiplanar CT image reconstructions of the cervical spine were also generated. COMPARISON:  Head CT- 07/24/2010 FINDINGS: CT HEAD FINDINGS Regional soft tissues appear normal.  No radiopaque foreign body. No displaced calvarial fracture. Note is made of mild hyperostosis frontalis. Mild atrophy with sulcal prominence and prominence of the bifrontal extra-axial spaces. The gray-white differentiation is maintained without CT evidence of acute large territory infarct. No intraparenchymal or extra-axial mass or hemorrhage. Normal size and configuration of the ventricles and the basilar cisterns. No midline shift. Small air-fluid level seen within the right sphenoid sinus. The remaining paranasal sinuses and mastoid air cells are normally aerated. CT CERVICAL SPINE FINDINGS C1 to the superior endplate of T1 is imaged. Normal alignment of the cervical spine. No anterolisthesis or retrolisthesis. The dens is normally positioned between the lateral masses of C1. Mild degenerative change involving the atlantodental articulation. Note is made of a approximately 1.5 x 0.5 x 0.9 cm ossicle adjacent to the cranial aspect of the anterior arch of C1, likely the sequela of prior  avulsive injury. Normal atlantoaxial articulations. No fracture or static subluxation of the cervical spine. Cervical vertebral body heights are preserved. Prevertebral soft tissues are normal. Mild multilevel cervical spine DDD, worse at C5-C6 and C6-C7 with disc space height loss, endplate irregularity and sclerosis. No bulky cervical lymphadenopathy on this noncontrast examination. Normal noncontrast appearance of the thyroid gland. IMPRESSION: 1. Mild atrophy without acute intracranial process. 2. No fracture or static subluxation of the cervical spine. 3. Mild multilevel cervical spine DDD. 4. Small air-fluid level within the right sphenoid sinus, nonspecific though could be indicative of acute sinusitis. Clinical correlation is advised. Electronically Signed   By: Sandi Mariscal M.D.   On: 04/02/2016 16:13   Ct Chest W Contrast  Result Date: 04/02/2016 CLINICAL DATA:  Status post fall to the right side of the body, with pain. Initial encounter. EXAM: CT CHEST, ABDOMEN, AND PELVIS WITH CONTRAST TECHNIQUE: Multidetector CT imaging of the chest, abdomen and pelvis was performed following the standard protocol during bolus administration of intravenous contrast. CONTRAST:  156mL ISOVUE-300 IOPAMIDOL (ISOVUE-300) INJECTION 61% COMPARISON:  CT of the abdomen and pelvis from 08/14/2010, and MRI of the lumbar spine performed 07/22/2013 FINDINGS: CT CHEST A 7 mm pleural-based nodule is noted along the right lower lobe (image 64 of 127), and a 5 mm nodule is noted along the left major fissure (image 64 of 127). The nodule along the fissure may simply reflect a lymph node. Mild bibasilar atelectasis is noted. No pleural effusion or pneumothorax is seen. The mediastinum is unremarkable in appearance. No mediastinal lymphadenopathy is seen. No pericardial effusion is identified. The great vessels are grossly unremarkable in appearance. The visualized portions of thyroid gland are unremarkable. No axillary lymphadenopathy  is seen. There is no evidence of significant soft tissue injury along the chest wall. No acute osseous abnormalities are identified. CT ABDOMEN AND PELVIS No free air or free fluid is seen within the abdomen or pelvis. There is no evidence of solid or hollow organ injury. The liver and spleen are unremarkable in appearance. The gallbladder is within normal limits. The pancreas and adrenal glands are unremarkable. The kidneys are unremarkable in appearance. There is no evidence of hydronephrosis. No renal or ureteral stones are seen. No perinephric stranding is appreciated. No free fluid is identified. The small bowel is unremarkable in appearance. The stomach is within normal limits. No acute vascular abnormalities are seen.  Scattered calcification is noted along the abdominal aorta and its branches. The appendix is normal in caliber, without evidence of appendicitis. Scattered diverticulosis is noted along the sigmoid colon, without evidence of diverticulitis. 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 There is no evidence of recurrence at this time. The bladder is moderately distended and grossly unremarkable. The patient is status post hysterectomy. No suspicious adnexal masses are seen. No inguinal lymphadenopathy is seen. Prominent superficial varices are noted anterior to the right hip, and along the right flank. No acute osseous abnormalities are identified. Underlying facet disease is noted along the lower lumbar spine. IMPRESSION: 1. No evidence of traumatic injury to the chest, abdomen or pelvis. 2. Prominent superficial varices anterior to the right hip, and along the right flank. 3. Mild bibasilar atelectasis noted. 4. 7 mm pleural based nodule along the right lower lung lobe. Non-contrast chest CT at 6-12 months is recommended. If the nodule is stable at time of repeat CT, then future CT at 18-24 months (from today's scan) is considered optional for low-risk patients, but is recommended  for high-risk patients. This recommendation follows the consensus statement: Guidelines for Management of Incidental Pulmonary Nodules Detected on CT Images:From the Fleischner Society 2017; published online before print (10.1148/[id number]). 5. Scattered calcification along the abdominal aorta and its branches. 6. Scattered diverticulosis along the sigmoid colon, without evidence of diverticulitis. Electronically Signed   By: Garald Balding M.D.   On: 04/02/2016 21:40   Ct Cervical Spine Wo Contrast  Result Date: 04/02/2016 CLINICAL DATA:  Hit head on bird feeder now with pain involving the right side of the head. EXAM: CT HEAD WITHOUT CONTRAST CT CERVICAL SPINE WITHOUT CONTRAST TECHNIQUE: Multidetector CT imaging of the head and cervical spine was performed following the standard protocol without intravenous contrast. Multiplanar CT image reconstructions of the cervical spine were also generated. COMPARISON:  Head CT- 07/24/2010 FINDINGS: CT HEAD FINDINGS Regional soft tissues appear normal.  No radiopaque foreign body. No displaced calvarial fracture. Note is made of mild hyperostosis frontalis. Mild atrophy with sulcal prominence and prominence of the bifrontal extra-axial spaces. The gray-white differentiation is maintained without CT evidence of acute large territory infarct. No intraparenchymal or extra-axial mass or hemorrhage. Normal size and configuration of the ventricles and the basilar cisterns. No midline shift. Small air-fluid level seen within the right sphenoid sinus. The remaining paranasal sinuses and mastoid air cells are normally aerated. CT CERVICAL SPINE FINDINGS C1 to the superior endplate of T1 is imaged. Normal alignment of the cervical spine. No anterolisthesis or retrolisthesis. The dens is normally positioned between the lateral masses of C1. Mild degenerative change involving the atlantodental articulation. Note is made of a approximately 1.5 x 0.5 x 0.9 cm ossicle adjacent to  the cranial aspect of the anterior arch of C1, likely the sequela of prior avulsive injury. Normal atlantoaxial articulations. No fracture or static subluxation of the cervical spine. Cervical vertebral body heights are preserved. Prevertebral soft tissues are normal. Mild multilevel cervical spine DDD, worse at C5-C6 and C6-C7 with disc space height loss, endplate irregularity and sclerosis. No bulky cervical lymphadenopathy on this noncontrast examination. Normal noncontrast appearance of the thyroid gland. IMPRESSION: 1. Mild atrophy without acute intracranial process. 2. No fracture or static subluxation of the cervical spine. 3. Mild multilevel cervical spine DDD. 4. Small air-fluid level within the right sphenoid sinus, nonspecific though could be indicative of acute sinusitis. Clinical correlation is advised. Electronically Signed   By: Eldridge Abrahams.D.  On: 04/02/2016 16:13   Ct Abdomen Pelvis W Contrast  Result Date: 04/02/2016 CLINICAL DATA:  Status post fall to the right side of the body, with pain. Initial encounter. EXAM: CT CHEST, ABDOMEN, AND PELVIS WITH CONTRAST TECHNIQUE: Multidetector CT imaging of the chest, abdomen and pelvis was performed following the standard protocol during bolus administration of intravenous contrast. CONTRAST:  129mL ISOVUE-300 IOPAMIDOL (ISOVUE-300) INJECTION 61% COMPARISON:  CT of the abdomen and pelvis from 08/14/2010, and MRI of the lumbar spine performed 07/22/2013 FINDINGS: CT CHEST A 7 mm pleural-based nodule is noted along the right lower lobe (image 64 of 127), and a 5 mm nodule is noted along the left major fissure (image 64 of 127). The nodule along the fissure may simply reflect a lymph node. Mild bibasilar atelectasis is noted. No pleural effusion or pneumothorax is seen. The mediastinum is unremarkable in appearance. No mediastinal lymphadenopathy is seen. No pericardial effusion is identified. The great vessels are grossly unremarkable in appearance. The  visualized portions of thyroid gland are unremarkable. No axillary lymphadenopathy is seen. There is no evidence of significant soft tissue injury along the chest wall. No acute osseous abnormalities are identified. CT ABDOMEN AND PELVIS No free air or free fluid is seen within the abdomen or pelvis. There is no evidence of solid or hollow organ injury. The liver and spleen are unremarkable in appearance. The gallbladder is within normal limits. The pancreas and adrenal glands are unremarkable. The kidneys are unremarkable in appearance. There is no evidence of hydronephrosis. No renal or ureteral stones are seen. No perinephric stranding is appreciated. No free fluid is identified. The small bowel is unremarkable in appearance. The stomach is within normal limits. No acute vascular abnormalities are seen. Scattered calcification is noted along the abdominal aorta and its branches. The appendix is normal in caliber, without evidence of appendicitis. Scattered diverticulosis is noted along the sigmoid colon, without evidence of diverticulitis. 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 There is no evidence of recurrence at this time. The bladder is moderately distended and grossly unremarkable. The patient is status post hysterectomy. No suspicious adnexal masses are seen. No inguinal lymphadenopathy is seen. Prominent superficial varices are noted anterior to the right hip, and along the right flank. No acute osseous abnormalities are identified. Underlying facet disease is noted along the lower lumbar spine. IMPRESSION: 1. No evidence of traumatic injury to the chest, abdomen or pelvis. 2. Prominent superficial varices anterior to the right hip, and along the right flank. 3. Mild bibasilar atelectasis noted. 4. 7 mm pleural based nodule along the right lower lung lobe. Non-contrast chest CT at 6-12 months is recommended. If the nodule is stable at time of repeat CT, then future CT at 18-24 months  (from today's scan) is considered optional for low-risk patients, but is recommended for high-risk patients. This recommendation follows the consensus statement: Guidelines for Management of Incidental Pulmonary Nodules Detected on CT Images:From the Fleischner Society 2017; published online before print (10.1148/[id number]). 5. Scattered calcification along the abdominal aorta and its branches. 6. Scattered diverticulosis along the sigmoid colon, without evidence of diverticulitis. Electronically Signed   By: Garald Balding M.D.   On: 04/02/2016 21:40    Assessment & Plan:   Colleen White was seen today for rash and hypertension.  Diagnoses and all orders for this visit:  Seborrheic dermatitis of scalp- will continue the topical steroid that she is already using and will try a systemic antihistamine as well to control the symptoms. -  fluocinolone (SYNALAR) 0.01 % external solution; Apply topically 2 (two) times daily. -     doxepin (SINEQUAN) 10 MG capsule; Take 1 capsule (10 mg total) by mouth at bedtime.  Essential hypertension- her blood pressure is adequately well controlled.   I have discontinued Ms. Nosbisch's loratadine, XARELTO, and mirtazapine. I am also having her start on fluocinolone and doxepin. Additionally, I am having her maintain her docusate sodium, brimonidine-timolol, cholecalciferol, tretinoin, Minoxidil, Multiple Vitamins-Minerals (PRESERVISION AREDS 2 PO), vitamin C, Probiotic Product (PROBIOTIC DAILY PO), Calcium Carbonate-Vitamin D (CALCIUM 500 + D PO), pravastatin, traMADol, diltiazem, rivaroxaban, ranitidine, and telmisartan-hydrochlorothiazide.  Meds ordered this encounter  Medications  . fluocinolone (SYNALAR) 0.01 % external solution    Sig: Apply topically 2 (two) times daily.    Dispense:  90 mL    Refill:  5  . doxepin (SINEQUAN) 10 MG capsule    Sig: Take 1 capsule (10 mg total) by mouth at bedtime.    Dispense:  90 capsule    Refill:  1      Follow-up: Return in about 3 months (around 07/11/2017).  Scarlette Calico, MD

## 2017-04-14 ENCOUNTER — Encounter: Payer: Self-pay | Admitting: Internal Medicine

## 2017-04-15 ENCOUNTER — Other Ambulatory Visit: Payer: Self-pay | Admitting: Internal Medicine

## 2017-04-15 DIAGNOSIS — L219 Seborrheic dermatitis, unspecified: Secondary | ICD-10-CM

## 2017-04-15 MED ORDER — KETOCONAZOLE 2 % EX SHAM: 1.0000 "application " | MEDICATED_SHAMPOO | CUTANEOUS | 2 refills | Status: DC

## 2017-04-15 MED ORDER — METHYLPREDNISOLONE 4 MG PO TBPK: ORAL_TABLET | ORAL | 0 refills | Status: DC

## 2017-04-18 DIAGNOSIS — L089 Local infection of the skin and subcutaneous tissue, unspecified: Secondary | ICD-10-CM | POA: Diagnosis not present

## 2017-04-18 DIAGNOSIS — L280 Lichen simplex chronicus: Secondary | ICD-10-CM | POA: Diagnosis not present

## 2017-04-23 DIAGNOSIS — L089 Local infection of the skin and subcutaneous tissue, unspecified: Secondary | ICD-10-CM | POA: Insufficient documentation

## 2017-04-23 DIAGNOSIS — L280 Lichen simplex chronicus: Secondary | ICD-10-CM | POA: Insufficient documentation

## 2017-05-06 ENCOUNTER — Other Ambulatory Visit (HOSPITAL_COMMUNITY): Payer: Self-pay | Admitting: Cardiology

## 2017-05-07 NOTE — Telephone Encounter (Signed)
Request received fro Xarelto 20mg ; pt is 72 yrs old, wt-74.8kg, Crea-0.73 on 12/24/16, last seen by Dr. Aundra Dubin and Dr. Radford Pax on 05/30/2016& appts set for October & November 2018; will send in refill request to requested pharmacy.

## 2017-05-13 DIAGNOSIS — I781 Nevus, non-neoplastic: Secondary | ICD-10-CM | POA: Diagnosis not present

## 2017-05-13 DIAGNOSIS — I8390 Asymptomatic varicose veins of unspecified lower extremity: Secondary | ICD-10-CM | POA: Diagnosis not present

## 2017-05-13 DIAGNOSIS — D1801 Hemangioma of skin and subcutaneous tissue: Secondary | ICD-10-CM | POA: Diagnosis not present

## 2017-05-13 DIAGNOSIS — L299 Pruritus, unspecified: Secondary | ICD-10-CM | POA: Diagnosis not present

## 2017-05-13 DIAGNOSIS — C44310 Basal cell carcinoma of skin of unspecified parts of face: Secondary | ICD-10-CM | POA: Diagnosis not present

## 2017-05-13 DIAGNOSIS — C44319 Basal cell carcinoma of skin of other parts of face: Secondary | ICD-10-CM | POA: Diagnosis not present

## 2017-05-13 DIAGNOSIS — L821 Other seborrheic keratosis: Secondary | ICD-10-CM | POA: Diagnosis not present

## 2017-05-22 ENCOUNTER — Encounter: Payer: Self-pay | Admitting: Cardiology

## 2017-05-23 DIAGNOSIS — C44319 Basal cell carcinoma of skin of other parts of face: Secondary | ICD-10-CM | POA: Diagnosis not present

## 2017-05-30 ENCOUNTER — Encounter: Payer: Self-pay | Admitting: Cardiology

## 2017-05-30 ENCOUNTER — Ambulatory Visit (INDEPENDENT_AMBULATORY_CARE_PROVIDER_SITE_OTHER): Payer: Medicare Other | Admitting: Cardiology

## 2017-05-30 VITALS — BP 118/62 | HR 74 | Ht 63.0 in | Wt 164.2 lb

## 2017-05-30 DIAGNOSIS — I10 Essential (primary) hypertension: Secondary | ICD-10-CM

## 2017-05-30 DIAGNOSIS — Z4802 Encounter for removal of sutures: Secondary | ICD-10-CM | POA: Diagnosis not present

## 2017-05-30 DIAGNOSIS — C44319 Basal cell carcinoma of skin of other parts of face: Secondary | ICD-10-CM | POA: Diagnosis not present

## 2017-05-30 DIAGNOSIS — Z483 Aftercare following surgery for neoplasm: Secondary | ICD-10-CM | POA: Diagnosis not present

## 2017-05-30 DIAGNOSIS — G4733 Obstructive sleep apnea (adult) (pediatric): Secondary | ICD-10-CM

## 2017-05-30 NOTE — Progress Notes (Signed)
Cardiology Office Note:    Date:  05/30/2017   ID:  Colleen White, DOB Dec 16, 1945, MRN [id number]  PCP:  Janith Lima, MD  Cardiologist:  Fransico Him, MD   Referring MD: Janith Lima, MD   Chief Complaint  Patient presents with  . Sleep Apnea  . Hypertension    History of Present Illness:    Colleen White is a 71 y.o. female with a hx of mild OSA with an AHI of 9.1/hr and is on CPAP at 7cm H2O.  She is doing well with her CPAP device and thinks that she has gotten used to it.  She tolerates the nasal pillow mask and feels the pressure is adequate.  Since going on CPAP she feels rested in the am for the most part and sleeps well at night.  She has no significant daytime sleepiness.  She denies any significant mouth or nasal dryness or nasal congestion.  She does not think that he snores.     Past Medical History:  Diagnosis Date  . Allergic rhinitis, cause unspecified   . Alopecia   . Anxiety   . Colon polyps   . Diverticulosis of colon (without mention of hemorrhage)   . Fibrocystic breast disease   . GERD (gastroesophageal reflux disease)   . Headache(784.0)   . Hemorrhoids   . History of echocardiogram    Echo 3/17:  Vigorous LVF, EF 65-70%, normal wall motion, grade 1 diastolic, mildly elevated LVOT velocity of 2.2 m/s-likely related to vigorous LVF  . History of stress test    Myoview normal in 2/05. //  Stress echo (11/15) with no evidence for ischemia or infarction.  [name] HLD (hyperlipidemia)   . HTN (hypertension)   . Hypertension   . Mitral valve disorders(424.0)   . Non Hodgkin's lymphoma ([place])    Treated at [facility] with surgery and XRT.  Did not have chemotherapy.   . OSA (obstructive sleep apnea) 03/29/2016  . Osteopenia   . PAF (paroxysmal atrial fibrillation) ([place])    a. CHADS2-VASc=3 //  b. Xarelto started in 2017  . Pulmonary nodule    Presumed benign after observation.   [name] Unspecified glaucoma(365.9)     Past Surgical History:  Procedure  Laterality Date  . ABDOMINAL HYSTERECTOMY  1996  . EYE SURGERY  2009   Cataract  . 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 Cloyd Stagers, [facility]  . s/p ORIF prox phalanx  02/2007   right little finger----Dr Fredna Dow  . s/p TAH/BSO      Current Medications: Current Meds  Medication Sig  . brimonidine-timolol (COMBIGAN) 0.2-0.5 % ophthalmic solution Place 1 drop into both eyes every 12 (twelve) hours.    . Calcium Carbonate-Vitamin D (CALCIUM 500 + D PO) Take 1 tablet by mouth 2 (two) times daily.  . cholecalciferol (VITAMIN D) 1000 UNITS tablet Take 1,000 Units by mouth daily.    [name] diltiazem (CARDIZEM CD) 120 MG 24 hr capsule Take 1 capsule (120 mg total) by mouth daily.  [name] docusate sodium (COLACE) 100 MG capsule Take 100 mg by mouth every evening. Take once daily   . doxepin (SINEQUAN) 10 MG capsule Take 1 capsule (10 mg total) by mouth at bedtime.  . gabapentin (NEURONTIN) 100 MG capsule Take 100 mg by mouth daily.  . Minoxidil 5 % FOAM Apply 3-5 mLs topically at bedtime. Apply to Scalp for hair loss at bedtime   . Multiple Vitamins-Minerals (PRESERVISION AREDS 2 PO) Take 1  tablet by mouth 2 (two) times daily.   . pravastatin (PRAVACHOL) 40 MG tablet TAKE 1 TABLET ONCE DAILY.  . Probiotic Product (PROBIOTIC DAILY PO) Take 1 tablet by mouth daily.  . ranitidine (ZANTAC) 300 MG tablet Take 1 tablet (300 mg total) by mouth at bedtime.  [name] telmisartan-hydrochlorothiazide (MICARDIS HCT) 80-12.5 MG tablet TAKE 1 TABLET ONCE DAILY.  . traMADol (ULTRAM) 50 MG tablet TAKE (1) TABLET TWICE A DAY AS NEEDED.  [name] tretinoin (RETIN-A) 0.05 % cream Once daily   . vitamin C (ASCORBIC ACID) 500 MG tablet Take 500 mg by mouth daily.  Alveda Reasons 20 MG TABS tablet TAKE 1 TABLET DAILY WITH SUPPER.     Allergies:   Patient has no known allergies.   Social History   Social History  . Marital status: Married    Spouse name: N/A  . Number of children: N/A  . Years of education: N/A    Social History Main Topics  . Smoking status: Former Smoker    Quit date: 08/27/1974  . Smokeless tobacco: Never Used  . Alcohol use No     Comment: occasionally  . Drug use: No  . Sexual activity: Yes   Other Topics Concern  . None   Social History Narrative  . None     Family History: The patient's family history includes Cancer in her father and sister; Hypertension in her mother.  ROS:   Please see the history of present illness.    ROS  All other systems reviewed and negative.   EKGs/Labs/Other Studies Reviewed:    The following studies were reviewed today: CPAP download  EKG:  EKG is not ordered today.    Recent Labs: 12/24/2016: ALT 21; BUN 11; BUN 11; Creatinine, Ser 0.73; Creatinine, Ser 0.73; Hemoglobin 13.7; Platelets 210.0; Potassium 3.8; Potassium 3.8; Sodium 140; Sodium 140; TSH 2.19   Recent Lipid Panel    Component Value Date/Time   CHOL 163 12/24/2016 0915   TRIG 103.0 12/24/2016 0915   HDL 43.20 12/24/2016 0915   CHOLHDL 4 12/24/2016 0915   VLDL 20.6 12/24/2016 0915   LDLCALC 99 12/24/2016 0915   LDLDIRECT 142.5 09/30/2012 1110    Physical Exam:    VS:  BP 118/62   Pulse 74   Ht 5' 3"  (1.6 m)   Wt 164 lb 3.2 oz (74.5 kg)   SpO2 99%   BMI 29.09 kg/m     Wt Readings from Last 3 Encounters:  05/30/17 164 lb 3.2 oz (74.5 kg)  04/10/17 165 lb (74.8 kg)  12/24/16 160 lb 8 oz (72.8 kg)     GEN:  Well nourished, well developed in no acute distress HEENT: Normal NECK: No JVD; No carotid bruits LYMPHATICS: No lymphadenopathy CARDIAC: RRR, no murmurs, rubs, gallops RESPIRATORY:  Clear to auscultation without rales, wheezing or rhonchi  ABDOMEN: Soft, non-tender, non-distended MUSCULOSKELETAL:  No edema; No deformity  SKIN: Warm and dry NEUROLOGIC:  Alert and oriented x 3 PSYCHIATRIC:  Normal affect   ASSESSMENT:    1. OSA (obstructive sleep apnea)   2. Essential hypertension    PLAN:    In order of problems listed above:  1.   OSA - the patient is tolerating PAP therapy well without any problems. The PAP download was reviewed today and showed an AHI of 1.8/hr on 7 cm H2O with 97% compliance in using more than 4 hours nightly.  The patient has been using and benefiting from CPAP use and will continue to  benefit from therapy.   2.  HTN - BP is well controlled on exam today.  She will continue on Micardis HCT and Cardizem.      Medication Adjustments/Labs and Tests Ordered: Current medicines are reviewed at length with the patient today.  Concerns regarding medicines are outlined above.  No orders of the defined types were placed in this encounter.  No orders of the defined types were placed in this encounter.   Signed, Fransico Him, MD  05/30/2017 10:19 AM    [place]

## 2017-05-30 NOTE — Patient Instructions (Signed)

## 2017-06-11 ENCOUNTER — Other Ambulatory Visit (HOSPITAL_COMMUNITY): Payer: Self-pay | Admitting: Cardiology

## 2017-06-11 ENCOUNTER — Other Ambulatory Visit: Payer: Self-pay | Admitting: Internal Medicine

## 2017-06-25 ENCOUNTER — Encounter: Payer: Self-pay | Admitting: Internal Medicine

## 2017-06-25 ENCOUNTER — Other Ambulatory Visit (INDEPENDENT_AMBULATORY_CARE_PROVIDER_SITE_OTHER): Payer: Medicare Other

## 2017-06-25 ENCOUNTER — Ambulatory Visit (INDEPENDENT_AMBULATORY_CARE_PROVIDER_SITE_OTHER): Payer: Medicare Other | Admitting: Internal Medicine

## 2017-06-25 VITALS — BP 120/72 | HR 58 | Temp 98.0°F | Resp 16 | Ht 63.0 in | Wt 163.0 lb

## 2017-06-25 DIAGNOSIS — E559 Vitamin D deficiency, unspecified: Secondary | ICD-10-CM

## 2017-06-25 DIAGNOSIS — I480 Paroxysmal atrial fibrillation: Secondary | ICD-10-CM | POA: Diagnosis not present

## 2017-06-25 DIAGNOSIS — R739 Hyperglycemia, unspecified: Secondary | ICD-10-CM | POA: Diagnosis not present

## 2017-06-25 DIAGNOSIS — Z23 Encounter for immunization: Secondary | ICD-10-CM

## 2017-06-25 DIAGNOSIS — I10 Essential (primary) hypertension: Secondary | ICD-10-CM | POA: Diagnosis not present

## 2017-06-25 LAB — BASIC METABOLIC PANEL
BUN: 13 mg/dL (ref 6–23)
CO2: 31 mEq/L (ref 19–32)
Calcium: 9.8 mg/dL (ref 8.4–10.5)
Chloride: 99 mEq/L (ref 96–112)
Creatinine, Ser: 0.75 mg/dL (ref 0.40–1.20)
GFR: 80.58 mL/min (ref >60.00)
Glucose, Bld: 94 mg/dL (ref 70–99)
Potassium: 3.7 mEq/L (ref 3.5–5.1)
Sodium: 139 mEq/L (ref 135–145)

## 2017-06-25 LAB — HEMOGLOBIN A1C: Hgb A1c MFr Bld: 5.7 % (ref 4.6–6.5)

## 2017-06-25 MED ORDER — CHOLECALCIFEROL 50 MCG (2000 UT) PO TABS: 1.0000 | ORAL_TABLET | Freq: Every day | ORAL | 3 refills | Status: DC

## 2017-06-25 NOTE — Progress Notes (Signed)
Subjective:  Patient ID: Colleen White, female    DOB: 08/07/1946  Age: 71 y.o. MRN: [id number]  CC: Hypertension   HPI Colleen White presents for f/up - she feels well today and offers no complaints.  She wants her vitamin D level to be in the 50-70 range.  A few months ago she experienced some diarrhea related to eating ice cream and says she stopped eating ice cream and the diarrhea has resolved.  She otherwise feels well and offers no other complaints today.  Outpatient Medications Prior to Visit  Medication Sig Dispense Refill  . brimonidine-timolol (COMBIGAN) 0.2-0.5 % ophthalmic solution Place 1 drop into both eyes every 12 (twelve) hours.      . Calcium Carbonate-Vitamin D (CALCIUM 500 + D PO) Take 1 tablet by mouth 2 (two) times daily.    [name] diltiazem (CARDIZEM CD) 120 MG 24 hr capsule TAKE (1) CAPSULE DAILY. 30 capsule 3  . doxepin (SINEQUAN) 10 MG capsule Take 1 capsule (10 mg total) by mouth at bedtime. 90 capsule 1  . gabapentin (NEURONTIN) 100 MG capsule Take 100 mg by mouth daily.  0  . Minoxidil 5 % FOAM Apply 3-5 mLs topically at bedtime. Apply to Scalp for hair loss at bedtime     . Multiple Vitamins-Minerals (PRESERVISION AREDS 2 PO) Take 1 tablet by mouth 2 (two) times daily.     . pravastatin (PRAVACHOL) 40 MG tablet TAKE 1 TABLET ONCE DAILY. 90 tablet 1  . Probiotic Product (PROBIOTIC DAILY PO) Take 1 tablet by mouth daily.    . ranitidine (ZANTAC) 300 MG tablet Take 1 tablet (300 mg total) by mouth at bedtime. 90 tablet 3  . telmisartan-hydrochlorothiazide (MICARDIS HCT) 80-12.5 MG tablet TAKE 1 TABLET ONCE DAILY. 30 tablet 5  . traMADol (ULTRAM) 50 MG tablet TAKE (1) TABLET TWICE A DAY AS NEEDED. 60 tablet 3  . tretinoin (RETIN-A) 0.05 % cream Once daily     . vitamin C (ASCORBIC ACID) 500 MG tablet Take 500 mg by mouth daily.    Alveda Reasons 20 MG TABS tablet TAKE 1 TABLET DAILY WITH SUPPER. 30 tablet 5  . cholecalciferol (VITAMIN D) 1000 UNITS tablet Take 1,000  Units by mouth daily.      [name] docusate sodium (COLACE) 100 MG capsule Take 100 mg by mouth every evening. Take once daily      No facility-administered medications prior to visit.     ROS Review of Systems  Constitutional: Negative.  Negative for appetite change, chills, diaphoresis, fatigue and fever.  HENT: Negative.   Eyes: Negative.  Negative for visual disturbance.  Respiratory: Negative for cough, chest tightness, shortness of breath and wheezing.   Cardiovascular: Negative for chest pain, palpitations and leg swelling.  Gastrointestinal: Negative for abdominal pain, constipation, diarrhea, nausea and vomiting.  Endocrine: Negative.   Genitourinary: Negative.  Negative for difficulty urinating.  Musculoskeletal: Negative.   Skin: Negative.  Negative for color change.  Allergic/Immunologic: Negative.   Neurological: Negative.  Negative for dizziness, weakness and headaches.  Hematological: Negative for adenopathy. Does not bruise/bleed easily.  Psychiatric/Behavioral: Negative.     Objective:  BP 120/72 (BP Location: Left Arm, Patient Position: Sitting, Cuff Size: Normal)   Pulse (!) 58   Temp 98 F (36.7 C) (Oral)   Resp 16   Ht 5' 3"  (1.6 m)   Wt 163 lb (73.9 kg)   SpO2 94%   BMI 28.87 kg/m   BP Readings from Last 3 Encounters:  06/25/17  120/72  05/30/17 118/62  04/10/17 132/72    Wt Readings from Last 3 Encounters:  06/25/17 163 lb (73.9 kg)  05/30/17 164 lb 3.2 oz (74.5 kg)  04/10/17 165 lb (74.8 kg)    Physical Exam  Constitutional: She is oriented to person, place, and time. No distress.  HENT:  Mouth/Throat: Oropharynx is clear and moist. No oropharyngeal exudate.  Eyes: Conjunctivae are normal. Right eye exhibits no discharge. Left eye exhibits no discharge. No scleral icterus.  Neck: Normal range of motion. Neck supple. No JVD present. No thyromegaly present.  Cardiovascular: Normal rate, regular rhythm and intact distal pulses.  Exam reveals no  gallop and no friction rub.   No murmur heard. Pulmonary/Chest: Effort normal and breath sounds normal. No respiratory distress. She has no wheezes. She has no rales. She exhibits no tenderness.  Abdominal: Soft. Bowel sounds are normal. She exhibits no distension and no mass. There is no tenderness. There is no rebound and no guarding.  Musculoskeletal: Normal range of motion. She exhibits no edema, tenderness or deformity.  Lymphadenopathy:    She has no cervical adenopathy.  Neurological: She is alert and oriented to person, place, and time.  Skin: Skin is warm and dry. No rash noted. She is not diaphoretic. No erythema. No pallor.  Vitals reviewed.   Lab Results  Component Value Date   WBC 5.1 12/24/2016   HGB 13.7 12/24/2016   HCT 40.4 12/24/2016   PLT 210.0 12/24/2016   GLUCOSE 94 06/25/2017   CHOL 163 12/24/2016   TRIG 103.0 12/24/2016   HDL 43.20 12/24/2016   LDLDIRECT 142.5 09/30/2012   LDLCALC 99 12/24/2016   ALT 21 12/24/2016   AST 22 12/24/2016   NA 139 06/25/2017   K 3.7 06/25/2017   CL 99 06/25/2017   CREATININE 0.75 06/25/2017   BUN 13 06/25/2017   CO2 31 06/25/2017   TSH 2.19 12/24/2016   HGBA1C 5.7 06/25/2017    Dg Ribs Unilateral W/chest Right  Result Date: 04/02/2016 CLINICAL DATA:  Injury to the wrist and hips today. Patient is on blood thinner. Fell on a concrete planter with her right ribs. Pain below diaphragm anterior ribs. Three areas marked. Heard a snap the. EXAM: RIGHT RIBS AND CHEST - 3+ VIEW COMPARISON:  12/21/2015. FINDINGS: Heart size is normal. Lungs are clear. No pneumothorax. Multiple oblique views are performed, showing a nondisplaced fracture of the right lateral sixth rib. IMPRESSION: 1.  No evidence for acute cardiopulmonary abnormality. 2. Fracture of the right lateral sixth rib. Electronically Signed   By: Nolon Nations M.D.   On: 04/02/2016 16:38   Ct Head Wo Contrast  Result Date: 04/02/2016 CLINICAL DATA:  Hit head on bird feeder  now with pain involving the right side of the head. EXAM: CT HEAD WITHOUT CONTRAST CT CERVICAL SPINE WITHOUT CONTRAST TECHNIQUE: Multidetector CT imaging of the head and cervical spine was performed following the standard protocol without intravenous contrast. Multiplanar CT image reconstructions of the cervical spine were also generated. COMPARISON:  Head CT- 07/24/2010 FINDINGS: CT HEAD FINDINGS Regional soft tissues appear normal.  No radiopaque foreign body. No displaced calvarial fracture. Note is made of mild hyperostosis frontalis. Mild atrophy with sulcal prominence and prominence of the bifrontal extra-axial spaces. The gray-white differentiation is maintained without CT evidence of acute large territory infarct. No intraparenchymal or extra-axial mass or hemorrhage. Normal size and configuration of the ventricles and the basilar cisterns. No midline shift. Small air-fluid level seen within the right  sphenoid sinus. The remaining paranasal sinuses and mastoid air cells are normally aerated. CT CERVICAL SPINE FINDINGS C1 to the superior endplate of T1 is imaged. Normal alignment of the cervical spine. No anterolisthesis or retrolisthesis. The dens is normally positioned between the lateral masses of C1. Mild degenerative change involving the atlantodental articulation. Note is made of a approximately 1.5 x 0.5 x 0.9 cm ossicle adjacent to the cranial aspect of the anterior arch of C1, likely the sequela of prior avulsive injury. Normal atlantoaxial articulations. No fracture or static subluxation of the cervical spine. Cervical vertebral body heights are preserved. Prevertebral soft tissues are normal. Mild multilevel cervical spine DDD, worse at C5-C6 and C6-C7 with disc space height loss, endplate irregularity and sclerosis. No bulky cervical lymphadenopathy on this noncontrast examination. Normal noncontrast appearance of the thyroid gland. IMPRESSION: 1. Mild atrophy without acute intracranial process.  2. No fracture or static subluxation of the cervical spine. 3. Mild multilevel cervical spine DDD. 4. Small air-fluid level within the right sphenoid sinus, nonspecific though could be indicative of acute sinusitis. Clinical correlation is advised. Electronically Signed   By: Sandi Mariscal M.D.   On: 04/02/2016 16:13   Ct Chest W Contrast  Result Date: 04/02/2016 CLINICAL DATA:  Status post fall to the right side of the body, with pain. Initial encounter. EXAM: CT CHEST, ABDOMEN, AND PELVIS WITH CONTRAST TECHNIQUE: Multidetector CT imaging of the chest, abdomen and pelvis was performed following the standard protocol during bolus administration of intravenous contrast. CONTRAST:  127m ISOVUE-300 IOPAMIDOL (ISOVUE-300) INJECTION 61% COMPARISON:  CT of the abdomen and pelvis from 08/14/2010, and MRI of the lumbar spine performed 07/22/2013 FINDINGS: CT CHEST A 7 mm pleural-based nodule is noted along the right lower lobe (image 64 of 127), and a 5 mm nodule is noted along the left major fissure (image 64 of 127). The nodule along the fissure may simply reflect a lymph node. Mild bibasilar atelectasis is noted. No pleural effusion or pneumothorax is seen. The mediastinum is unremarkable in appearance. No mediastinal lymphadenopathy is seen. No pericardial effusion is identified. The great vessels are grossly unremarkable in appearance. The visualized portions of thyroid gland are unremarkable. No axillary lymphadenopathy is seen. There is no evidence of significant soft tissue injury along the chest wall. No acute osseous abnormalities are identified. CT ABDOMEN AND PELVIS No free air or free fluid is seen within the abdomen or pelvis. There is no evidence of solid or hollow organ injury. The liver and spleen are unremarkable in appearance. The gallbladder is within normal limits. The pancreas and adrenal glands are unremarkable. The kidneys are unremarkable in appearance. There is no evidence of hydronephrosis. No  renal or ureteral stones are seen. No perinephric stranding is appreciated. No free fluid is identified. The small bowel is unremarkable in appearance. The stomach is within normal limits. No acute vascular abnormalities are seen. Scattered calcification is noted along the abdominal aorta and its branches. The appendix is normal in caliber, without evidence of appendicitis. Scattered diverticulosis is noted along the sigmoid colon, without evidence of diverticulitis. 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 There is no evidence of recurrence at this time. The bladder is moderately distended and grossly unremarkable. The patient is status post hysterectomy. No suspicious adnexal masses are seen. No inguinal lymphadenopathy is seen. Prominent superficial varices are noted anterior to the right hip, and along the right flank. No acute osseous abnormalities are identified. Underlying facet disease is noted along the lower  lumbar spine. IMPRESSION: 1. No evidence of traumatic injury to the chest, abdomen or pelvis. 2. Prominent superficial varices anterior to the right hip, and along the right flank. 3. Mild bibasilar atelectasis noted. 4. 7 mm pleural based nodule along the right lower lung lobe. Non-contrast chest CT at 6-12 months is recommended. If the nodule is stable at time of repeat CT, then future CT at 18-24 months (from today's scan) is considered optional for low-risk patients, but is recommended for high-risk patients. This recommendation follows the consensus statement: Guidelines for Management of Incidental Pulmonary Nodules Detected on CT Images:From the Fleischner Society 2017; published online before print (10.1148/[id number]). 5. Scattered calcification along the abdominal aorta and its branches. 6. Scattered diverticulosis along the sigmoid colon, without evidence of diverticulitis. Electronically Signed   By: Garald Balding M.D.   On: 04/02/2016 21:40   Ct Cervical Spine  Wo Contrast  Result Date: 04/02/2016 CLINICAL DATA:  Hit head on bird feeder now with pain involving the right side of the head. EXAM: CT HEAD WITHOUT CONTRAST CT CERVICAL SPINE WITHOUT CONTRAST TECHNIQUE: Multidetector CT imaging of the head and cervical spine was performed following the standard protocol without intravenous contrast. Multiplanar CT image reconstructions of the cervical spine were also generated. COMPARISON:  Head CT- 07/24/2010 FINDINGS: CT HEAD FINDINGS Regional soft tissues appear normal.  No radiopaque foreign body. No displaced calvarial fracture. Note is made of mild hyperostosis frontalis. Mild atrophy with sulcal prominence and prominence of the bifrontal extra-axial spaces. The gray-white differentiation is maintained without CT evidence of acute large territory infarct. No intraparenchymal or extra-axial mass or hemorrhage. Normal size and configuration of the ventricles and the basilar cisterns. No midline shift. Small air-fluid level seen within the right sphenoid sinus. The remaining paranasal sinuses and mastoid air cells are normally aerated. CT CERVICAL SPINE FINDINGS C1 to the superior endplate of T1 is imaged. Normal alignment of the cervical spine. No anterolisthesis or retrolisthesis. The dens is normally positioned between the lateral masses of C1. Mild degenerative change involving the atlantodental articulation. Note is made of a approximately 1.5 x 0.5 x 0.9 cm ossicle adjacent to the cranial aspect of the anterior arch of C1, likely the sequela of prior avulsive injury. Normal atlantoaxial articulations. No fracture or static subluxation of the cervical spine. Cervical vertebral body heights are preserved. Prevertebral soft tissues are normal. Mild multilevel cervical spine DDD, worse at C5-C6 and C6-C7 with disc space height loss, endplate irregularity and sclerosis. No bulky cervical lymphadenopathy on this noncontrast examination. Normal noncontrast appearance of the  thyroid gland. IMPRESSION: 1. Mild atrophy without acute intracranial process. 2. No fracture or static subluxation of the cervical spine. 3. Mild multilevel cervical spine DDD. 4. Small air-fluid level within the right sphenoid sinus, nonspecific though could be indicative of acute sinusitis. Clinical correlation is advised. Electronically Signed   By: Sandi Mariscal M.D.   On: 04/02/2016 16:13   Ct Abdomen Pelvis W Contrast  Result Date: 04/02/2016 CLINICAL DATA:  Status post fall to the right side of the body, with pain. Initial encounter. EXAM: CT CHEST, ABDOMEN, AND PELVIS WITH CONTRAST TECHNIQUE: Multidetector CT imaging of the chest, abdomen and pelvis was performed following the standard protocol during bolus administration of intravenous contrast. CONTRAST:  141m ISOVUE-300 IOPAMIDOL (ISOVUE-300) INJECTION 61% COMPARISON:  CT of the abdomen and pelvis from 08/14/2010, and MRI of the lumbar spine performed 07/22/2013 FINDINGS: CT CHEST A 7 mm pleural-based nodule is noted along the right lower lobe (  image 64 of 127), and a 5 mm nodule is noted along the left major fissure (image 64 of 127). The nodule along the fissure may simply reflect a lymph node. Mild bibasilar atelectasis is noted. No pleural effusion or pneumothorax is seen. The mediastinum is unremarkable in appearance. No mediastinal lymphadenopathy is seen. No pericardial effusion is identified. The great vessels are grossly unremarkable in appearance. The visualized portions of thyroid gland are unremarkable. No axillary lymphadenopathy is seen. There is no evidence of significant soft tissue injury along the chest wall. No acute osseous abnormalities are identified. CT ABDOMEN AND PELVIS No free air or free fluid is seen within the abdomen or pelvis. There is no evidence of solid or hollow organ injury. The liver and spleen are unremarkable in appearance. The gallbladder is within normal limits. The pancreas and adrenal glands are unremarkable.  The kidneys are unremarkable in appearance. There is no evidence of hydronephrosis. No renal or ureteral stones are seen. No perinephric stranding is appreciated. No free fluid is identified. The small bowel is unremarkable in appearance. The stomach is within normal limits. No acute vascular abnormalities are seen. Scattered calcification is noted along the abdominal aorta and its branches. The appendix is normal in caliber, without evidence of appendicitis. Scattered diverticulosis is noted along the sigmoid colon, without evidence of diverticulitis. 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 There is no evidence of recurrence at this time. The bladder is moderately distended and grossly unremarkable. The patient is status post hysterectomy. No suspicious adnexal masses are seen. No inguinal lymphadenopathy is seen. Prominent superficial varices are noted anterior to the right hip, and along the right flank. No acute osseous abnormalities are identified. Underlying facet disease is noted along the lower lumbar spine. IMPRESSION: 1. No evidence of traumatic injury to the chest, abdomen or pelvis. 2. Prominent superficial varices anterior to the right hip, and along the right flank. 3. Mild bibasilar atelectasis noted. 4. 7 mm pleural based nodule along the right lower lung lobe. Non-contrast chest CT at 6-12 months is recommended. If the nodule is stable at time of repeat CT, then future CT at 18-24 months (from today's scan) is considered optional for low-risk patients, but is recommended for high-risk patients. This recommendation follows the consensus statement: Guidelines for Management of Incidental Pulmonary Nodules Detected on CT Images:From the Fleischner Society 2017; published online before print (10.1148/[id number]). 5. Scattered calcification along the abdominal aorta and its branches. 6. Scattered diverticulosis along the sigmoid colon, without evidence of diverticulitis.  Electronically Signed   By: Garald Balding M.D.   On: 04/02/2016 21:40    Assessment & Plan:   Ciarrah was seen today for hypertension.  Diagnoses and all orders for this visit:  Essential hypertension- her BP is well controlled, lytes and renal fxn are normal -     Basic metabolic panel; Future  Paroxysmal atrial fibrillation ([place])- she has excellent rate/rhythm control, will cont the NOAC  Hyperglycemia- improvement noted with lifestyle modifications -     Basic metabolic panel; Future -     Hemoglobin A1c; Future  Vitamin D deficiency- will increase the Vit D dose -     Cholecalciferol 2000 units TABS; Take 1 tablet (2,000 Units total) by mouth daily.  Need for influenza vaccination -     Flu vaccine HIGH DOSE PF (Fluzone High dose)   I have discontinued Ms. Hammack's docusate sodium and cholecalciferol. I am also having her start on Cholecalciferol. Additionally, I am having her  maintain her brimonidine-timolol, tretinoin, Minoxidil, Multiple Vitamins-Minerals (PRESERVISION AREDS 2 PO), vitamin C, Probiotic Product (PROBIOTIC DAILY PO), Calcium Carbonate-Vitamin D (CALCIUM 500 + D PO), traMADol, ranitidine, telmisartan-hydrochlorothiazide, doxepin, XARELTO, gabapentin, diltiazem, and pravastatin.  Meds ordered this encounter  Medications  . Cholecalciferol 2000 units TABS    Sig: Take 1 tablet (2,000 Units total) by mouth daily.    Dispense:  90 tablet    Refill:  3     Follow-up: Return in about 6 months (around 12/24/2017).  Scarlette Calico, MD

## 2017-06-25 NOTE — Patient Instructions (Signed)

## 2017-06-30 ENCOUNTER — Other Ambulatory Visit: Payer: Self-pay | Admitting: Internal Medicine

## 2017-06-30 DIAGNOSIS — M8949 Other hypertrophic osteoarthropathy, multiple sites: Secondary | ICD-10-CM

## 2017-06-30 DIAGNOSIS — M150 Primary generalized (osteo)arthritis: Principal | ICD-10-CM

## 2017-06-30 DIAGNOSIS — M159 Polyosteoarthritis, unspecified: Secondary | ICD-10-CM

## 2017-06-30 MED ORDER — TRAMADOL HCL 50 MG PO TABS: ORAL_TABLET | ORAL | 3 refills | Status: DC

## 2017-07-02 ENCOUNTER — Telehealth: Payer: Self-pay

## 2017-07-02 DIAGNOSIS — C8290 Follicular lymphoma, unspecified, unspecified site: Secondary | ICD-10-CM | POA: Diagnosis not present

## 2017-07-02 MED ORDER — GABAPENTIN 100 MG PO CAPS: 100.0000 mg | ORAL_CAPSULE | Freq: Every day | ORAL | 0 refills | Status: DC

## 2017-07-02 NOTE — Telephone Encounter (Signed)
Pt request refill of gabapentin to have on hand for scalp itch.  Okayed for PCP. Erx sent to [facility] per rq.

## 2017-07-04 DIAGNOSIS — M25532 Pain in left wrist: Secondary | ICD-10-CM | POA: Diagnosis not present

## 2017-07-09 ENCOUNTER — Other Ambulatory Visit: Payer: Self-pay

## 2017-07-09 ENCOUNTER — Other Ambulatory Visit: Payer: Self-pay | Admitting: Internal Medicine

## 2017-07-09 ENCOUNTER — Ambulatory Visit (HOSPITAL_COMMUNITY)
Admission: RE | Admit: 2017-07-09 | Discharge: 2017-07-09 | Disposition: A | Discharge status: Home / Self Care | Payer: Medicare Other | Source: Ambulatory Visit | Attending: Cardiology | Admitting: Cardiology

## 2017-07-09 ENCOUNTER — Encounter (HOSPITAL_COMMUNITY): Payer: Self-pay | Admitting: Cardiology

## 2017-07-09 VITALS — BP 110/68 | HR 69 | Wt 166.4 lb

## 2017-07-09 DIAGNOSIS — E785 Hyperlipidemia, unspecified: Secondary | ICD-10-CM | POA: Insufficient documentation

## 2017-07-09 DIAGNOSIS — K219 Gastro-esophageal reflux disease without esophagitis: Secondary | ICD-10-CM | POA: Diagnosis not present

## 2017-07-09 DIAGNOSIS — Z8249 Family history of ischemic heart disease and other diseases of the circulatory system: Secondary | ICD-10-CM | POA: Insufficient documentation

## 2017-07-09 DIAGNOSIS — I10 Essential (primary) hypertension: Secondary | ICD-10-CM

## 2017-07-09 DIAGNOSIS — Z79899 Other long term (current) drug therapy: Secondary | ICD-10-CM | POA: Insufficient documentation

## 2017-07-09 DIAGNOSIS — Z7901 Long term (current) use of anticoagulants: Secondary | ICD-10-CM | POA: Insufficient documentation

## 2017-07-09 DIAGNOSIS — R001 Bradycardia, unspecified: Secondary | ICD-10-CM | POA: Insufficient documentation

## 2017-07-09 DIAGNOSIS — Z8572 Personal history of non-Hodgkin lymphomas: Secondary | ICD-10-CM | POA: Insufficient documentation

## 2017-07-09 DIAGNOSIS — I480 Paroxysmal atrial fibrillation: Secondary | ICD-10-CM

## 2017-07-09 DIAGNOSIS — G4733 Obstructive sleep apnea (adult) (pediatric): Secondary | ICD-10-CM | POA: Diagnosis not present

## 2017-07-09 DIAGNOSIS — R918 Other nonspecific abnormal finding of lung field: Secondary | ICD-10-CM | POA: Diagnosis not present

## 2017-07-09 NOTE — Progress Notes (Signed)
Patient ID: Colleen White, female   DOB: 06/15/1946, 71 y.o.   MRN: [id number] PCP: Dr. Ronnald Ramp Cardiology: Dr. Aundra Dubin  71 y.o. with history of HTN, hyperlipidemia, paroxysmal atrial fibrillation and prior non-Hodgkins lymphoma presents for followup of atrial fibrillation.  She called the office in 1/17 to report increased episodes of palpitations.  She had had rare tachypalpitations for years, but in 1/17 she had 2 episodes of tachypalpitations close together.  I had her wear an event monitor.  By monitor, her tachypalpitations correlated with atrial fibrillation with RVR.  She was started on Xarelto and Toprol XL.  Toprol XL caused swelling, so it was stopped and diltiazem CD was begun.  Echo in 3/17 showed EF 65-70% with mildly elevated LVOT velocity due to vigorous contraction.  She has been doing well recently.  No significant dyspnea or chest pain.  No palpitations, she is in NSR today.   No melena/BRBPR. Using CPAP.   ECG: NSR, normal (personally reviewed)  Labs (4/15): K 3.7, creatinine 0.6, LDL 111, HDL 46 Labs (4/16): LDL 89 Labs (3/17): K 3.8, creatinine 0.7, HCT 37.3 Labs (4/17): LDL 93, HDL 46 Labs (8/17): hgb 13.1, K 3.4, creatinine 0.7 Labs (4/18): LDL 99, hgb 13.7 Labs (10/18): K 3.7, creatinine 0.75  PMH: 1. Echo (11/15): EF 60-65%, trivial MR. Echo (3/17) with EF 65-70%, mildly elevated LVOT velocity due to vigorous contraction. 2. Chest pain: Myoview normal in 2/05. Stress echo (11/15) with no evidence for ischemia or infarction.  3. Suspected glaucoma 4. HTN 5. Hyperlipidemia 6. GERD 7. Diverticulosis 8. TAH/BSO 9. Non-Hodgkins lymphoma: In remission.  Treated at [facility] with surgery and XRT.  Did not have chemotherapy.  10. Pulmonary nodules: Presumed benign after observation.  11. Atrial fibrillation: Paroxysmal.  12. OSA: Uses CPAP.   SH: Married, cares for elderly mother, nonsmoker.  Lives in [place].  FH: Mother with atrial fibrillation and CAD.   ROS: All  systems reviewed and negative except as per HPI.   Current Outpatient Medications  Medication Sig Dispense Refill  . brimonidine-timolol (COMBIGAN) 0.2-0.5 % ophthalmic solution Place 1 drop into both eyes every 12 (twelve) hours.      . Calcium Carbonate-Vitamin D (CALCIUM 500 + D PO) Take 1 tablet by mouth 2 (two) times daily.    . Cholecalciferol 2000 units TABS Take 1 tablet (2,000 Units total) by mouth daily. 90 tablet 3  . diltiazem (CARDIZEM CD) 120 MG 24 hr capsule TAKE (1) CAPSULE DAILY. 30 capsule 3  . doxepin (SINEQUAN) 10 MG capsule Take 1 capsule (10 mg total) by mouth at bedtime. 90 capsule 1  . gabapentin (NEURONTIN) 100 MG capsule Take 1 capsule (100 mg total) daily by mouth. 90 capsule 0  . Minoxidil 5 % FOAM Apply 3-5 mLs topically at bedtime. Apply to Scalp for hair loss at bedtime     . Multiple Vitamins-Minerals (PRESERVISION AREDS 2 PO) Take 1 tablet by mouth 2 (two) times daily.     . pravastatin (PRAVACHOL) 40 MG tablet TAKE 1 TABLET ONCE DAILY. 90 tablet 1  . Probiotic Product (PROBIOTIC DAILY PO) Take 1 tablet by mouth daily.    . ranitidine (ZANTAC) 300 MG tablet Take 1 tablet (300 mg total) by mouth at bedtime. 90 tablet 3  . telmisartan-hydrochlorothiazide (MICARDIS HCT) 80-12.5 MG tablet TAKE 1 TABLET ONCE DAILY. 90 tablet 1  . traMADol (ULTRAM) 50 MG tablet TAKE (1) TABLET TWICE A DAY AS NEEDED. 60 tablet 3  . tretinoin (RETIN-A) 0.05 % cream  Once daily     . vitamin C (ASCORBIC ACID) 500 MG tablet Take 500 mg by mouth daily.    Alveda Reasons 20 MG TABS tablet TAKE 1 TABLET DAILY WITH SUPPER. 30 tablet 5   No current facility-administered medications for this encounter.     BP 110/68   Pulse 69   Wt 166 lb 6.4 oz (75.5 kg)   SpO2 96%   BMI 29.48 kg/m  General: NAD Neck: No JVD, no thyromegaly or thyroid nodule.  Lungs: Clear to auscultation bilaterally with normal respiratory effort. CV: Nondisplaced PMI.  Heart regular S1/S2, no S3/S4, 1/6 SEM RUSB.  No  peripheral edema.  No carotid bruit.  Normal pedal pulses.  Abdomen: Soft, nontender, no hepatosplenomegaly, no distention.  Skin: Intact without lesions or rashes.  Neurologic: Alert and oriented x 3.  Psych: Normal affect. Extremities: No clubbing or cyanosis.  HEENT: Normal.   Assessment/Plan: 1. HTN: BP is controlled on current regimen, continue.  2. Hyperlipidemia: Lipids ok in 4/18.  3. Atrial fibrillation: Paroxysmal.  First noted in 1/17.  No symptomatic recurrences recently.   CHADSVASC = 3.  - Continue diltiazem CD 120 daily.  - Continue Xarelto 20 daily.  - She is now using CPAP. 4. OSA: Using CPAP.   Followup in 1 year.   [name] 07/09/2017

## 2017-07-09 NOTE — Patient Instructions (Addendum)
Your physician recommends that you schedule a follow-up appointment in: 1 year with Dr. Aundra Dubin  (we will call you, or you can call us in October 2019)

## 2017-07-10 DIAGNOSIS — L299 Pruritus, unspecified: Secondary | ICD-10-CM | POA: Diagnosis not present

## 2017-07-10 DIAGNOSIS — Z85828 Personal history of other malignant neoplasm of skin: Secondary | ICD-10-CM | POA: Diagnosis not present

## 2017-07-10 DIAGNOSIS — Z87891 Personal history of nicotine dependence: Secondary | ICD-10-CM | POA: Diagnosis not present

## 2017-07-10 DIAGNOSIS — L280 Lichen simplex chronicus: Secondary | ICD-10-CM | POA: Diagnosis not present

## 2017-07-10 DIAGNOSIS — L84 Corns and callosities: Secondary | ICD-10-CM | POA: Diagnosis not present

## 2017-07-19 ENCOUNTER — Emergency Department (HOSPITAL_BASED_OUTPATIENT_CLINIC_OR_DEPARTMENT_OTHER)
Admission: EM | Admit: 2017-07-19 | Discharge: 2017-07-19 | Disposition: A | Discharge status: Home / Self Care | Payer: Medicare Other | Attending: Emergency Medicine | Admitting: Emergency Medicine

## 2017-07-19 ENCOUNTER — Emergency Department (HOSPITAL_BASED_OUTPATIENT_CLINIC_OR_DEPARTMENT_OTHER): Payer: Medicare Other

## 2017-07-19 ENCOUNTER — Other Ambulatory Visit: Payer: Self-pay

## 2017-07-19 ENCOUNTER — Encounter (HOSPITAL_BASED_OUTPATIENT_CLINIC_OR_DEPARTMENT_OTHER): Payer: Self-pay | Admitting: Emergency Medicine

## 2017-07-19 DIAGNOSIS — Z7901 Long term (current) use of anticoagulants: Secondary | ICD-10-CM | POA: Diagnosis not present

## 2017-07-19 DIAGNOSIS — S99911A Unspecified injury of right ankle, initial encounter: Secondary | ICD-10-CM | POA: Diagnosis not present

## 2017-07-19 DIAGNOSIS — I480 Paroxysmal atrial fibrillation: Secondary | ICD-10-CM | POA: Diagnosis not present

## 2017-07-19 DIAGNOSIS — W19XXXA Unspecified fall, initial encounter: Secondary | ICD-10-CM

## 2017-07-19 DIAGNOSIS — Y939 Activity, unspecified: Secondary | ICD-10-CM | POA: Diagnosis not present

## 2017-07-19 DIAGNOSIS — M79662 Pain in left lower leg: Secondary | ICD-10-CM | POA: Diagnosis not present

## 2017-07-19 DIAGNOSIS — Z87891 Personal history of nicotine dependence: Secondary | ICD-10-CM | POA: Insufficient documentation

## 2017-07-19 DIAGNOSIS — W108XXA Fall (on) (from) other stairs and steps, initial encounter: Secondary | ICD-10-CM | POA: Insufficient documentation

## 2017-07-19 DIAGNOSIS — E785 Hyperlipidemia, unspecified: Secondary | ICD-10-CM | POA: Insufficient documentation

## 2017-07-19 DIAGNOSIS — Z79899 Other long term (current) drug therapy: Secondary | ICD-10-CM | POA: Insufficient documentation

## 2017-07-19 DIAGNOSIS — Y998 Other external cause status: Secondary | ICD-10-CM | POA: Insufficient documentation

## 2017-07-19 DIAGNOSIS — S82831A Other fracture of upper and lower end of right fibula, initial encounter for closed fracture: Secondary | ICD-10-CM | POA: Diagnosis not present

## 2017-07-19 DIAGNOSIS — S99912A Unspecified injury of left ankle, initial encounter: Secondary | ICD-10-CM | POA: Diagnosis not present

## 2017-07-19 DIAGNOSIS — M25562 Pain in left knee: Secondary | ICD-10-CM | POA: Diagnosis not present

## 2017-07-19 DIAGNOSIS — Y9259 Other trade areas as the place of occurrence of the external cause: Secondary | ICD-10-CM | POA: Insufficient documentation

## 2017-07-19 DIAGNOSIS — I10 Essential (primary) hypertension: Secondary | ICD-10-CM | POA: Insufficient documentation

## 2017-07-19 DIAGNOSIS — S8992XA Unspecified injury of left lower leg, initial encounter: Secondary | ICD-10-CM | POA: Diagnosis not present

## 2017-07-19 DIAGNOSIS — M79671 Pain in right foot: Secondary | ICD-10-CM | POA: Diagnosis not present

## 2017-07-19 DIAGNOSIS — M25571 Pain in right ankle and joints of right foot: Secondary | ICD-10-CM | POA: Diagnosis not present

## 2017-07-19 NOTE — ED Provider Notes (Signed)
[facility] DEPARTMENT Provider Note   CSN: [id number] Arrival date & time: 07/19/17  3154     History   Chief Complaint Chief Complaint  Patient presents with  . Fall    HPI Jordain Radin is a 71 y.o. female.  Patient is a 71 year old female with a history of hypertension, hyperlipidemia and atrial fibrillation on Xarelto who presents after a fall.  She states that she was walking down some exit stairs at a hotel after a fire alarm had gone off and tripped and fell down 3 or 4 stairs.  She is adamant that she did not hit her head.  This happened 2 days ago.  Since that time she has been having pain to both of her ankles and her left knee.  She is able to walk on them but states that they are both sore.  She denies any neck or back pain.  No chest or abdominal trauma.  She has been using Tylenol with some improvement in symptoms.      Past Medical History:  Diagnosis Date  . Allergic rhinitis, cause unspecified   . Alopecia   . Anxiety   . Colon polyps   . Diverticulosis of colon (without mention of hemorrhage)   . Fibrocystic breast disease   . GERD (gastroesophageal reflux disease)   . Headache(784.0)   . Hemorrhoids   . History of echocardiogram    Echo 3/17:  Vigorous LVF, EF 65-70%, normal wall motion, grade 1 diastolic, mildly elevated LVOT velocity of 2.2 m/s-likely related to vigorous LVF  . History of stress test    Myoview normal in 2/05. //  Stress echo (11/15) with no evidence for ischemia or infarction.  [name] HLD (hyperlipidemia)   . HTN (hypertension)   . Hypertension   . Mitral valve disorders(424.0)   . Non Hodgkin's lymphoma ([place])    Treated at [facility] with surgery and XRT.  Did not have chemotherapy.   . OSA (obstructive sleep apnea) 03/29/2016  . Osteopenia   . PAF (paroxysmal atrial fibrillation) ([place])    a. CHADS2-VASc=3 //  b. Xarelto started in 2017  . Pulmonary nodule    Presumed benign after observation.   [name] Unspecified  glaucoma(365.9)     Patient Active Problem List   Diagnosis Date Noted  . Primary osteoarthritis involving multiple joints 06/30/2017  . Seborrheic dermatitis of scalp 04/10/2017  . Current moderate episode of major depressive disorder without prior episode ([place]) 04/07/2017  . Hyperglycemia 12/24/2016  . Vitamin D deficiency 12/24/2016  . OSA (obstructive sleep apnea) 03/29/2016  . Paroxysmal atrial fibrillation ([place]) 11/08/2015  . Routine general medical examination at a health care facility 11/30/2013  . Other abnormal and inconclusive findings on diagnostic imaging of breast 06/18/2013  . 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 [facility]) 10/20/2010  . GLAUCOMA 08/04/2008  . ALLERGIC RHINITIS 12/17/2007  . Disorder of bone and cartilage 12/17/2007  . HYPERCHOLESTEROLEMIA 07/31/2007  . Essential hypertension 07/31/2007  . MITRAL VALVE PROLAPSE 07/31/2007  . GERD 07/31/2007    Past Surgical History:  Procedure Laterality Date  . ABDOMINAL HYSTERECTOMY  1996  . EYE SURGERY  2009   Cataract  . 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 Cloyd Stagers, [facility]  . s/p ORIF prox phalanx  02/2007   right little finger----Dr Fredna Dow  . s/p TAH/BSO      OB History    No data available  Home Medications    Prior to Admission medications   Medication Sig Start Date End Date Taking? Authorizing Provider  brimonidine-timolol (COMBIGAN) 0.2-0.5 % ophthalmic solution Place 1 drop into both eyes every 12 (twelve) hours.      [provider]  Calcium Carbonate-Vitamin D (CALCIUM 500 + D PO) Take 1 tablet by mouth 2 (two) times daily.    [provider]  Cholecalciferol 2000 units TABS Take 1 tablet (2,000 Units total) by mouth daily. 06/25/17   Janith Lima, MD  diltiazem (CARDIZEM CD) 120 MG 24 hr capsule TAKE (1) CAPSULE DAILY. 06/12/17   Larey Dresser, MD  doxepin (SINEQUAN) 10 MG capsule Take 1 capsule  (10 mg total) by mouth at bedtime. 04/10/17   Janith Lima, MD  gabapentin (NEURONTIN) 100 MG capsule Take 1 capsule (100 mg total) daily by mouth. 07/02/17   Janith Lima, MD  Minoxidil 5 % FOAM Apply 3-5 mLs topically at bedtime. Apply to Scalp for hair loss at bedtime     [provider]  Multiple Vitamins-Minerals (PRESERVISION AREDS 2 PO) Take 1 tablet by mouth 2 (two) times daily.     [provider]  pravastatin (PRAVACHOL) 40 MG tablet TAKE 1 TABLET ONCE DAILY. 06/11/17   Janith Lima, MD  Probiotic Product (PROBIOTIC DAILY PO) Take 1 tablet by mouth daily.    [provider]  ranitidine (ZANTAC) 300 MG tablet Take 1 tablet (300 mg total) by mouth at bedtime. 12/24/16   Janith Lima, MD  telmisartan-hydrochlorothiazide (MICARDIS HCT) 80-12.5 MG tablet TAKE 1 TABLET ONCE DAILY. 07/09/17   Janith Lima, MD  traMADol (ULTRAM) 50 MG tablet TAKE (1) TABLET TWICE A DAY AS NEEDED. 06/30/17   Janith Lima, MD  tretinoin (RETIN-A) 0.05 % cream Once daily     [provider]  vitamin C (ASCORBIC ACID) 500 MG tablet Take 500 mg by mouth daily.    [provider]  XARELTO 20 MG TABS tablet TAKE 1 TABLET DAILY WITH SUPPER. 05/07/17   Larey Dresser, MD    Family History Family History  Problem Relation Age of Onset  . Hypertension Mother   . Cancer Father        throat  . Cancer Sister        breast (sister)    Social History Social History   Tobacco Use  . Smoking status: Former Smoker    Last attempt to quit: 08/27/1974    Years since quitting: 42.9  . Smokeless tobacco: Never Used  Substance Use Topics  . Alcohol use: No    Comment: occasionally  . Drug use: No     Allergies   Patient has no known allergies.   Review of Systems Review of Systems  Constitutional: Negative for fever.  Gastrointestinal: Negative for nausea and vomiting.  Musculoskeletal: Positive for arthralgias and joint swelling. Negative for back  pain and neck pain.  Skin: Negative for wound.  Neurological: Negative for weakness, numbness and headaches.     Physical Exam Updated Vital Signs BP (!) 160/85 (BP Location: Right Arm)   Pulse 70   Temp 98.1 F (36.7 C) (Oral)   Resp 18   Ht 5\' 4"  (1.626 m)   Wt 73.9 kg (163 lb)   SpO2 100%   BMI 27.98 kg/m   Physical Exam  Constitutional: She is oriented to person, place, and time. She appears well-developed and well-nourished.  HENT:  Head: Normocephalic and atraumatic.  Neck: Normal range of motion. Neck supple.  No pain along the cervical thoracic or lumbosacral spine  Cardiovascular: Normal rate.  Pulmonary/Chest: Effort normal.  Musculoskeletal: She exhibits edema and tenderness.  Patient has some swelling and ecchymosis over the lateral aspect of the right ankle.  There is some underlying tenderness.  There is no pain to the lower leg or knee.  No pain to the hip on the right side.  On the left side she has no swelling to the leg.  There is some mild tenderness on range of motion of the left ankle.  There is some tenderness over the mid left fibula and there is some generalized tenderness to the left knee.  There is no effusion or deformity noted.  No pain on range of motion of the left hip.  She has normal sensation and motor function distally in the legs.  Pedal pulses are intact.  There is no pain on palpation or range of motion of the upper extremities.  Neurological: She is alert and oriented to person, place, and time.  Skin: Skin is warm and dry.  Psychiatric: She has a normal mood and affect.     ED Treatments / Results  Labs (all labs ordered are listed, but only abnormal results are displayed) Labs Reviewed - No data to display  EKG  EKG Interpretation None       Radiology Dg Tibia/fibula Left  Result Date: 07/19/2017 CLINICAL DATA:  Pt states she fell 2 nights ago while coming down stairs from the third floor, pt c/o left knee and lower leg pain  and bruising, bilateral ankle pain and swelling but left is worse then right. EXAM: LEFT TIBIA AND FIBULA - 2 VIEW COMPARISON:  None. FINDINGS: There is no evidence of fracture or other focal bone lesions. Soft tissues are unremarkable. IMPRESSION: Negative. Electronically Signed   By: Franki Cabot M.D.   On: 07/19/2017 10:21   Dg Ankle Complete Left  Result Date: 07/19/2017 CLINICAL DATA:  Pt states she fell 2 nights ago while coming down stairs from the third floor, pt c/o left knee and lower leg pain and bruising, bilateral ankle pain and swelling but left is worse then right EXAM: LEFT ANKLE COMPLETE - 3+ VIEW COMPARISON:  None. FINDINGS: Osseous alignment is normal. No fracture line or displaced fracture fragment. Ankle mortise is symmetric. Visualized portions of the hindfoot and midfoot appear intact and normally aligned. Soft tissues are unremarkable. IMPRESSION: Negative. Electronically Signed   By: Franki Cabot M.D.   On: 07/19/2017 10:27   Dg Ankle Complete Right  Result Date: 07/19/2017 CLINICAL DATA:  Pt states she fell 2 nights ago while coming down stairs from the third floor, pt c/o left knee and lower leg pain and bruising, bilateral ankle pain and swelling but left is worse then right EXAM: RIGHT ANKLE - COMPLETE 3+ VIEW COMPARISON:  None. FINDINGS: Osseous alignment is normal. No fracture line or displaced fracture fragment seen. Ankle mortise is symmetric. Visualized portions of the hindfoot appear intact and normal in alignment. Soft tissue swelling about the lateral malleolus. Osseous irregularity within the midfoot, likely adjacent to the cuboid bone or fifth metatarsal bone, suspected benign ossicle. IMPRESSION: 1. Soft tissue swelling over the lateral malleolus. No underlying fracture. 2. No acute appearing osseous abnormality. No fracture or dislocation at the level of the right ankle. 3. Osseous irregularity within the midfoot, only seen on the lateral view, likely adjacent to  the cuboid bone or fifth metatarsal  bone, suspected benign ossicle. If symptomatic in the midfoot, would consider dedicated plain film evaluation. Electronically Signed   By: Franki Cabot M.D.   On: 07/19/2017 10:27   Dg Knee Complete 4 Views Left  Result Date: 07/19/2017 CLINICAL DATA:  Pt states she fell 2 nights ago while coming down stairs from the third floor, pt c/o lef tlateral knee and lower leg pain and bruising, bilateral lateral ankle pain and swelling but left is worse then right EXAM: LEFT KNEE - COMPLETE 4+ VIEW COMPARISON:  None. FINDINGS: No evidence of fracture, dislocation, or joint effusion. No evidence of arthropathy or other focal bone abnormality. Soft tissues are unremarkable. IMPRESSION: Negative. Electronically Signed   By: Franki Cabot M.D.   On: 07/19/2017 10:22   Dg Foot Complete Right  Result Date: 07/19/2017 CLINICAL DATA:  Right foot pain following fall 2 days ago, initial encounter EXAM: RIGHT FOOT COMPLETE - 3+ VIEW COMPARISON:  None. FINDINGS: Soft tissue swelling is noted about the lateral malleolus. A partially corticated bony density is noted adjacent to the lateral malleolus which may represent an undisplaced fracture. Mild tarsal degenerative changes are noted as well as calcaneal spurs. No other fractures are noted. IMPRESSION: Changes suggestive of a nondisplaced lateral malleolar fracture. Electronically Signed   By: Inez Catalina M.D.   On: 07/19/2017 10:54    Procedures Procedures (including critical care time)  Medications Ordered in ED Medications - No data to display   Initial Impression / Assessment and Plan / ED Course  I have reviewed the triage vital signs and the nursing notes.  Pertinent labs & imaging results that were available during my care of the patient were reviewed by me and considered in my medical decision making (see chart for details).     On x-rays, patient has evidence of a nondisplaced fracture of the distal fibula on the  right.  No other bony injuries are identified.  There does not appear to be any joint instability.  She was placed in a Cam walker.  She will follow-up with [place] where she has been in the past.  She was advised in symptomatic care.  Final Clinical Impressions(s) / ED Diagnoses   Final diagnoses:  Fall, initial encounter  Closed fracture of distal end of right fibula, unspecified fracture morphology, initial encounter    ED Discharge Orders    None       Malvin Johns, MD 07/19/17 1133

## 2017-07-19 NOTE — ED Notes (Signed)
ED Provider at bedside. 

## 2017-07-19 NOTE — ED Triage Notes (Signed)
Pt reports that she tripped down several stairs while evacuating during a fire alarm 2 days ago. C/o bilateral ankle and L knee pain. Denies hitting head.

## 2017-07-22 DIAGNOSIS — N6012 Diffuse cystic mastopathy of left breast: Secondary | ICD-10-CM | POA: Diagnosis not present

## 2017-07-22 DIAGNOSIS — N6011 Diffuse cystic mastopathy of right breast: Secondary | ICD-10-CM | POA: Diagnosis not present

## 2017-07-22 DIAGNOSIS — N644 Mastodynia: Secondary | ICD-10-CM | POA: Diagnosis not present

## 2017-07-23 DIAGNOSIS — M25571 Pain in right ankle and joints of right foot: Secondary | ICD-10-CM | POA: Diagnosis not present

## 2017-07-23 DIAGNOSIS — M25572 Pain in left ankle and joints of left foot: Secondary | ICD-10-CM | POA: Diagnosis not present

## 2017-07-24 DIAGNOSIS — Z483 Aftercare following surgery for neoplasm: Secondary | ICD-10-CM | POA: Diagnosis not present

## 2017-07-24 DIAGNOSIS — C44319 Basal cell carcinoma of skin of other parts of face: Secondary | ICD-10-CM | POA: Diagnosis not present

## 2017-07-24 DIAGNOSIS — Z4889 Encounter for other specified surgical aftercare: Secondary | ICD-10-CM | POA: Diagnosis not present

## 2017-07-25 DIAGNOSIS — Z6827 Body mass index (BMI) 27.0-27.9, adult: Secondary | ICD-10-CM | POA: Diagnosis not present

## 2017-07-25 DIAGNOSIS — Z1231 Encounter for screening mammogram for malignant neoplasm of breast: Secondary | ICD-10-CM | POA: Diagnosis not present

## 2017-07-25 DIAGNOSIS — Z9189 Other specified personal risk factors, not elsewhere classified: Secondary | ICD-10-CM | POA: Diagnosis not present

## 2017-07-31 DIAGNOSIS — M25572 Pain in left ankle and joints of left foot: Secondary | ICD-10-CM | POA: Diagnosis not present

## 2017-08-06 DIAGNOSIS — M25572 Pain in left ankle and joints of left foot: Secondary | ICD-10-CM | POA: Diagnosis not present

## 2017-08-06 DIAGNOSIS — M25571 Pain in right ankle and joints of right foot: Secondary | ICD-10-CM | POA: Diagnosis not present

## 2017-08-12 ENCOUNTER — Other Ambulatory Visit: Payer: Self-pay | Admitting: Internal Medicine

## 2017-08-13 ENCOUNTER — Encounter: Payer: Self-pay | Admitting: Internal Medicine

## 2017-08-13 ENCOUNTER — Other Ambulatory Visit: Payer: Self-pay | Admitting: Internal Medicine

## 2017-08-13 DIAGNOSIS — M25572 Pain in left ankle and joints of left foot: Secondary | ICD-10-CM | POA: Diagnosis not present

## 2017-08-13 DIAGNOSIS — J0100 Acute maxillary sinusitis, unspecified: Secondary | ICD-10-CM | POA: Insufficient documentation

## 2017-08-13 DIAGNOSIS — M79671 Pain in right foot: Secondary | ICD-10-CM | POA: Diagnosis not present

## 2017-08-13 DIAGNOSIS — M25571 Pain in right ankle and joints of right foot: Secondary | ICD-10-CM | POA: Diagnosis not present

## 2017-08-13 MED ORDER — AZITHROMYCIN 500 MG PO TABS: 500.0000 mg | ORAL_TABLET | Freq: Every day | ORAL | 0 refills | Status: AC

## 2017-08-13 MED ORDER — HYDROCOD POLST-CPM POLST ER 10-8 MG/5ML PO SUER: 5.0000 mL | Freq: Two times a day (BID) | ORAL | 0 refills | Status: AC | PRN

## 2017-08-29 DIAGNOSIS — L299 Pruritus, unspecified: Secondary | ICD-10-CM | POA: Diagnosis not present

## 2017-08-29 DIAGNOSIS — L219 Seborrheic dermatitis, unspecified: Secondary | ICD-10-CM | POA: Diagnosis not present

## 2017-08-29 DIAGNOSIS — L821 Other seborrheic keratosis: Secondary | ICD-10-CM | POA: Diagnosis not present

## 2017-09-03 DIAGNOSIS — M79671 Pain in right foot: Secondary | ICD-10-CM | POA: Diagnosis not present

## 2017-09-03 DIAGNOSIS — M25572 Pain in left ankle and joints of left foot: Secondary | ICD-10-CM | POA: Diagnosis not present

## 2017-09-06 DIAGNOSIS — H43813 Vitreous degeneration, bilateral: Secondary | ICD-10-CM | POA: Diagnosis not present

## 2017-09-06 DIAGNOSIS — H5203 Hypermetropia, bilateral: Secondary | ICD-10-CM | POA: Diagnosis not present

## 2017-09-06 DIAGNOSIS — H04123 Dry eye syndrome of bilateral lacrimal glands: Secondary | ICD-10-CM | POA: Diagnosis not present

## 2017-09-06 DIAGNOSIS — H401132 Primary open-angle glaucoma, bilateral, moderate stage: Secondary | ICD-10-CM | POA: Diagnosis not present

## 2017-09-24 DIAGNOSIS — S8265XD Nondisplaced fracture of lateral malleolus of left fibula, subsequent encounter for closed fracture with routine healing: Secondary | ICD-10-CM | POA: Diagnosis not present

## 2017-10-09 DIAGNOSIS — C8290 Follicular lymphoma, unspecified, unspecified site: Secondary | ICD-10-CM | POA: Diagnosis not present

## 2017-10-09 DIAGNOSIS — C8590 Non-Hodgkin lymphoma, unspecified, unspecified site: Secondary | ICD-10-CM | POA: Diagnosis not present

## 2017-10-15 ENCOUNTER — Other Ambulatory Visit (HOSPITAL_COMMUNITY): Payer: Self-pay | Admitting: Cardiology

## 2017-10-16 DIAGNOSIS — R3129 Other microscopic hematuria: Secondary | ICD-10-CM | POA: Diagnosis not present

## 2017-10-16 DIAGNOSIS — R8290 Unspecified abnormal findings in urine: Secondary | ICD-10-CM | POA: Diagnosis not present

## 2017-10-16 DIAGNOSIS — R319 Hematuria, unspecified: Secondary | ICD-10-CM | POA: Diagnosis not present

## 2017-10-16 DIAGNOSIS — Z124 Encounter for screening for malignant neoplasm of cervix: Secondary | ICD-10-CM | POA: Diagnosis not present

## 2017-10-22 DIAGNOSIS — R3121 Asymptomatic microscopic hematuria: Secondary | ICD-10-CM | POA: Diagnosis not present

## 2017-11-07 ENCOUNTER — Encounter: Payer: Self-pay | Admitting: Internal Medicine

## 2017-11-07 DIAGNOSIS — R3129 Other microscopic hematuria: Secondary | ICD-10-CM | POA: Diagnosis not present

## 2017-11-07 DIAGNOSIS — R319 Hematuria, unspecified: Secondary | ICD-10-CM | POA: Diagnosis not present

## 2017-11-09 ENCOUNTER — Other Ambulatory Visit: Payer: Self-pay | Admitting: Internal Medicine

## 2017-11-09 DIAGNOSIS — F321 Major depressive disorder, single episode, moderate: Secondary | ICD-10-CM

## 2017-11-09 MED ORDER — SERTRALINE HCL 50 MG PO TABS: 50.0000 mg | ORAL_TABLET | Freq: Every day | ORAL | 1 refills | Status: DC

## 2017-11-12 ENCOUNTER — Other Ambulatory Visit (HOSPITAL_COMMUNITY): Payer: Self-pay | Admitting: Cardiology

## 2017-11-12 DIAGNOSIS — R3121 Asymptomatic microscopic hematuria: Secondary | ICD-10-CM | POA: Diagnosis not present

## 2017-11-12 DIAGNOSIS — N323 Diverticulum of bladder: Secondary | ICD-10-CM | POA: Diagnosis not present

## 2017-11-14 DIAGNOSIS — L219 Seborrheic dermatitis, unspecified: Secondary | ICD-10-CM | POA: Diagnosis not present

## 2017-11-14 DIAGNOSIS — L299 Pruritus, unspecified: Secondary | ICD-10-CM | POA: Diagnosis not present

## 2017-11-14 DIAGNOSIS — L658 Other specified nonscarring hair loss: Secondary | ICD-10-CM | POA: Diagnosis not present

## 2017-11-14 DIAGNOSIS — Z79899 Other long term (current) drug therapy: Secondary | ICD-10-CM | POA: Diagnosis not present

## 2017-11-25 ENCOUNTER — Other Ambulatory Visit: Payer: Self-pay | Admitting: Internal Medicine

## 2017-11-25 DIAGNOSIS — M150 Primary generalized (osteo)arthritis: Principal | ICD-10-CM

## 2017-11-25 DIAGNOSIS — M159 Polyosteoarthritis, unspecified: Secondary | ICD-10-CM

## 2017-11-25 DIAGNOSIS — M8949 Other hypertrophic osteoarthropathy, multiple sites: Secondary | ICD-10-CM

## 2017-12-02 ENCOUNTER — Other Ambulatory Visit: Payer: Self-pay | Admitting: Obstetrics & Gynecology

## 2017-12-02 ENCOUNTER — Telehealth (HOSPITAL_COMMUNITY): Payer: Self-pay | Admitting: *Deleted

## 2017-12-02 DIAGNOSIS — M8580 Other specified disorders of bone density and structure, unspecified site: Secondary | ICD-10-CM

## 2017-12-02 NOTE — Telephone Encounter (Signed)
Pt came by earlier this AM and filled out a walk-in form.  She stated on the form that she had an episode of a-fib yesterday afternoon, she took Cardizem and Xarelto at the time and symptoms resolved after about 15-20 minutes.  She states she also had and episode about 5-6 weeks ago.  Would like to know what to do in the future when this happens, she is going to [place] at the end of the month.  Called and spoke w/pt, she states she does take the Cardizem 120 mg every evening and Xarelto 20 mg every evening.  She states she feels fine now and when the episode occurred she went home and took Cardizem and Xarelto at 12:30 PM.  Advised as long as she takes the Xarelto every evening it is protecting her from clot formation and she should not take extra doses of this, she is agreeable.  Will discuss w/Dr Aundra Dubin if he wants her to continue to take extra Cardizem for this or not and call her back later today.

## 2017-12-02 NOTE — Telephone Encounter (Signed)
Pt aware and agreeable to take extra Cardizem and has no further questions.

## 2017-12-02 NOTE — Telephone Encounter (Signed)
If she feels atrial fibrillation, she can take an extra Cardizem (not Xarelto).  If afib seems to be occurring frequently, she can come in and we can discuss use of anti-arrhythmic.

## 2017-12-02 NOTE — Telephone Encounter (Signed)
Left message to call back  

## 2017-12-25 ENCOUNTER — Ambulatory Visit: Payer: Medicare Other | Admitting: Internal Medicine

## 2017-12-26 LAB — HM MAMMOGRAPHY: HM Mammogram: NORMAL (ref 0–4)

## 2018-01-02 DIAGNOSIS — R922 Inconclusive mammogram: Secondary | ICD-10-CM | POA: Diagnosis not present

## 2018-01-02 DIAGNOSIS — Z9189 Other specified personal risk factors, not elsewhere classified: Secondary | ICD-10-CM | POA: Diagnosis not present

## 2018-01-07 ENCOUNTER — Ambulatory Visit
Admission: RE | Admit: 2018-01-07 | Discharge: 2018-01-07 | Disposition: A | Discharge status: Home / Self Care | Payer: Medicare Other | Source: Ambulatory Visit | Attending: Obstetrics & Gynecology | Admitting: Obstetrics & Gynecology

## 2018-01-07 DIAGNOSIS — M8580 Other specified disorders of bone density and structure, unspecified site: Secondary | ICD-10-CM

## 2018-01-07 DIAGNOSIS — Z780 Asymptomatic menopausal state: Secondary | ICD-10-CM | POA: Diagnosis not present

## 2018-01-07 DIAGNOSIS — M85852 Other specified disorders of bone density and structure, left thigh: Secondary | ICD-10-CM | POA: Diagnosis not present

## 2018-01-09 ENCOUNTER — Ambulatory Visit: Payer: Medicare Other | Admitting: Internal Medicine

## 2018-01-17 DIAGNOSIS — Z85828 Personal history of other malignant neoplasm of skin: Secondary | ICD-10-CM | POA: Diagnosis not present

## 2018-01-17 DIAGNOSIS — L814 Other melanin hyperpigmentation: Secondary | ICD-10-CM | POA: Diagnosis not present

## 2018-01-17 DIAGNOSIS — Z87891 Personal history of nicotine dependence: Secondary | ICD-10-CM | POA: Diagnosis not present

## 2018-01-17 DIAGNOSIS — L280 Lichen simplex chronicus: Secondary | ICD-10-CM | POA: Diagnosis not present

## 2018-01-17 DIAGNOSIS — Z79899 Other long term (current) drug therapy: Secondary | ICD-10-CM | POA: Diagnosis not present

## 2018-01-18 ENCOUNTER — Emergency Department (HOSPITAL_BASED_OUTPATIENT_CLINIC_OR_DEPARTMENT_OTHER)
Admission: EM | Admit: 2018-01-18 | Discharge: 2018-01-18 | Disposition: A | Discharge status: Home / Self Care | Payer: Medicare Other | Attending: Emergency Medicine | Admitting: Emergency Medicine

## 2018-01-18 ENCOUNTER — Other Ambulatory Visit: Payer: Self-pay

## 2018-01-18 ENCOUNTER — Emergency Department (HOSPITAL_BASED_OUTPATIENT_CLINIC_OR_DEPARTMENT_OTHER): Payer: Medicare Other

## 2018-01-18 ENCOUNTER — Encounter (HOSPITAL_BASED_OUTPATIENT_CLINIC_OR_DEPARTMENT_OTHER): Payer: Self-pay | Admitting: Emergency Medicine

## 2018-01-18 DIAGNOSIS — M79641 Pain in right hand: Secondary | ICD-10-CM | POA: Diagnosis not present

## 2018-01-18 DIAGNOSIS — S8992XA Unspecified injury of left lower leg, initial encounter: Secondary | ICD-10-CM | POA: Diagnosis not present

## 2018-01-18 DIAGNOSIS — S96911A Strain of unspecified muscle and tendon at ankle and foot level, right foot, initial encounter: Secondary | ICD-10-CM | POA: Diagnosis not present

## 2018-01-18 DIAGNOSIS — S6991XA Unspecified injury of right wrist, hand and finger(s), initial encounter: Secondary | ICD-10-CM | POA: Diagnosis present

## 2018-01-18 DIAGNOSIS — S8002XA Contusion of left knee, initial encounter: Secondary | ICD-10-CM | POA: Diagnosis not present

## 2018-01-18 DIAGNOSIS — Z7901 Long term (current) use of anticoagulants: Secondary | ICD-10-CM | POA: Insufficient documentation

## 2018-01-18 DIAGNOSIS — Y929 Unspecified place or not applicable: Secondary | ICD-10-CM | POA: Insufficient documentation

## 2018-01-18 DIAGNOSIS — M25561 Pain in right knee: Secondary | ICD-10-CM | POA: Diagnosis not present

## 2018-01-18 DIAGNOSIS — S99922A Unspecified injury of left foot, initial encounter: Secondary | ICD-10-CM | POA: Diagnosis not present

## 2018-01-18 DIAGNOSIS — S8001XA Contusion of right knee, initial encounter: Secondary | ICD-10-CM | POA: Diagnosis not present

## 2018-01-18 DIAGNOSIS — Z79899 Other long term (current) drug therapy: Secondary | ICD-10-CM | POA: Insufficient documentation

## 2018-01-18 DIAGNOSIS — I10 Essential (primary) hypertension: Secondary | ICD-10-CM | POA: Diagnosis not present

## 2018-01-18 DIAGNOSIS — S60222A Contusion of left hand, initial encounter: Secondary | ICD-10-CM

## 2018-01-18 DIAGNOSIS — M79642 Pain in left hand: Secondary | ICD-10-CM | POA: Diagnosis not present

## 2018-01-18 DIAGNOSIS — S99921A Unspecified injury of right foot, initial encounter: Secondary | ICD-10-CM | POA: Diagnosis not present

## 2018-01-18 DIAGNOSIS — M79672 Pain in left foot: Secondary | ICD-10-CM | POA: Diagnosis not present

## 2018-01-18 DIAGNOSIS — Y939 Activity, unspecified: Secondary | ICD-10-CM | POA: Insufficient documentation

## 2018-01-18 DIAGNOSIS — Y999 Unspecified external cause status: Secondary | ICD-10-CM | POA: Diagnosis not present

## 2018-01-18 DIAGNOSIS — Z87891 Personal history of nicotine dependence: Secondary | ICD-10-CM | POA: Insufficient documentation

## 2018-01-18 DIAGNOSIS — W109XXA Fall (on) (from) unspecified stairs and steps, initial encounter: Secondary | ICD-10-CM | POA: Insufficient documentation

## 2018-01-18 DIAGNOSIS — M79671 Pain in right foot: Secondary | ICD-10-CM | POA: Diagnosis not present

## 2018-01-18 DIAGNOSIS — S96912A Strain of unspecified muscle and tendon at ankle and foot level, left foot, initial encounter: Secondary | ICD-10-CM | POA: Diagnosis not present

## 2018-01-18 DIAGNOSIS — S60221A Contusion of right hand, initial encounter: Secondary | ICD-10-CM | POA: Diagnosis not present

## 2018-01-18 DIAGNOSIS — M25562 Pain in left knee: Secondary | ICD-10-CM | POA: Diagnosis not present

## 2018-01-18 DIAGNOSIS — W19XXXA Unspecified fall, initial encounter: Secondary | ICD-10-CM

## 2018-01-18 NOTE — ED Notes (Addendum)
Pt reports that she fell down steps 6 months ago causing fracture to left leg. Pt had soft cast to right ankle. Pt c/o pain to B/L wrist, B/L knees B/L lower legs and feet.

## 2018-01-18 NOTE — ED Provider Notes (Signed)
[facility] EMERGENCY DEPARTMENT Provider Note   CSN: [id number] Arrival date & time: 01/18/18  1315     History   Chief Complaint Chief Complaint  Patient presents with  . Fall    HPI Colleen White is a 72 y.o. female.  Pt presents to the ED today s/p fall.  Pt slipped going down some steps yesterday around 1530 in [place].  She was able to drive home and iced her injuries.  She said both hands, knees, and feet still hurt today, so she wanted to make sure they were not broken.  She has been able to walk.  She denies hitting her head or h/a.  She is on Xarelto for a.fib.  CHA2DS2/VAS Stroke Risk Points  Current as of 14 minutes ago     3 >= 2 Points: High Risk  1 - 1.99 Points: Medium Risk  0 Points: Low Risk    This is the only CHA2DS2/VAS Stroke Risk Points available for the past  year.:  Last Change: N/A     Details    This score determines the patient's risk of having a stroke if the  patient has atrial fibrillation.       Points Metrics  0 Has Congestive Heart Failure:  No    Current as of 14 minutes ago  0 Has Vascular Disease:  No    Current as of 14 minutes ago  1 Has Hypertension:  Yes    Current as of 14 minutes ago  1 Age:  63    Current as of 14 minutes ago  0 Has Diabetes:  No    Current as of 14 minutes ago  0 Had Stroke:  No  Had TIA:  No  Had thromboembolism:  No    Current as of 14 minutes ago  1 Female:  Yes    Current as of 14 minutes ago              Past Medical History:  Diagnosis Date  . Allergic rhinitis, cause unspecified   . Alopecia   . Anxiety   . Colon polyps   . Diverticulosis of colon (without mention of hemorrhage)   . Fibrocystic breast disease   . GERD (gastroesophageal reflux disease)   . Headache(784.0)   . Hemorrhoids   . History of echocardiogram    Echo 3/17:  Vigorous LVF, EF 65-70%, normal wall motion, grade 1 diastolic, mildly elevated LVOT velocity of 2.2 m/s-likely related to vigorous LVF    . History of stress test    Myoview normal in 2/05. //  Stress echo (11/15) with no evidence for ischemia or infarction.  [name] HLD (hyperlipidemia)   . HTN (hypertension)   . Hypertension   . Mitral valve disorders(424.0)   . Non Hodgkin's lymphoma ([place])    Treated at [facility] with surgery and XRT.  Did not have chemotherapy.   . OSA (obstructive sleep apnea) 03/29/2016  . Osteopenia   . PAF (paroxysmal atrial fibrillation) ([place])    a. CHADS2-VASc=3 //  b. Xarelto started in 2017  . Pulmonary nodule    Presumed benign after observation.   [name] Unspecified glaucoma(365.9)     Patient Active Problem List   Diagnosis Date Noted  . Acute non-recurrent maxillary sinusitis 08/13/2017  . Primary osteoarthritis involving multiple joints 06/30/2017  . Seborrheic dermatitis of scalp 04/10/2017  . Current moderate episode of major depressive disorder without prior episode ([place]) 04/07/2017  . Hyperglycemia 12/24/2016  . Vitamin D  deficiency 12/24/2016  . OSA (obstructive sleep apnea) 03/29/2016  . Paroxysmal atrial fibrillation ([place]) 11/08/2015  . Routine general medical examination at a health care facility 11/30/2013  . Other abnormal and inconclusive findings on diagnostic imaging of breast 06/18/2013  . 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 [facility]) 10/20/2010  . GLAUCOMA 08/04/2008  . ALLERGIC RHINITIS 12/17/2007  . Disorder of bone and cartilage 12/17/2007  . HYPERCHOLESTEROLEMIA 07/31/2007  . Essential hypertension 07/31/2007  . MITRAL VALVE PROLAPSE 07/31/2007  . GERD 07/31/2007    Past Surgical History:  Procedure Laterality Date  . ABDOMINAL HYSTERECTOMY  1996  . EYE SURGERY  2009   Cataract  . 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 Cloyd Stagers, [place]  . s/p ORIF prox phalanx  02/2007   right little finger----Dr Fredna Dow  . s/p TAH/BSO       OB History   None      Home Medications    Prior to Admission  medications   Medication Sig Start Date End Date Taking? Authorizing Provider  brimonidine-timolol (COMBIGAN) 0.2-0.5 % ophthalmic solution Place 1 drop into both eyes every 12 (twelve) hours.      [provider]  Calcium Carbonate-Vitamin D (CALCIUM 500 + D PO) Take 1 tablet by mouth 2 (two) times daily.    [provider]  Cholecalciferol 2000 units TABS Take 1 tablet (2,000 Units total) by mouth daily. 06/25/17   Janith Lima, MD  diltiazem (CARDIZEM CD) 120 MG 24 hr capsule TAKE (1) CAPSULE DAILY. 10/16/17   Larey Dresser, MD  doxepin (SINEQUAN) 10 MG capsule Take 1 capsule (10 mg total) by mouth at bedtime. 04/10/17   Janith Lima, MD  gabapentin (NEURONTIN) 100 MG capsule Take 1 capsule (100 mg total) daily by mouth. 07/02/17   Janith Lima, MD  Minoxidil 5 % FOAM Apply 3-5 mLs topically at bedtime. Apply to Scalp for hair loss at bedtime     [provider]  Multiple Vitamins-Minerals (PRESERVISION AREDS 2 PO) Take 1 tablet by mouth 2 (two) times daily.     [provider]  pravastatin (PRAVACHOL) 40 MG tablet TAKE 1 TABLET ONCE DAILY. 08/12/17   Janith Lima, MD  Probiotic Product (PROBIOTIC DAILY PO) Take 1 tablet by mouth daily.    [provider]  ranitidine (ZANTAC) 300 MG tablet Take 1 tablet (300 mg total) by mouth at bedtime. 12/24/16   Janith Lima, MD  sertraline (ZOLOFT) 50 MG tablet Take 1 tablet (50 mg total) by mouth daily. 11/09/17   Janith Lima, MD  telmisartan-hydrochlorothiazide (MICARDIS HCT) 80-12.5 MG tablet TAKE 1 TABLET ONCE DAILY. 07/09/17   Janith Lima, MD  traMADol (ULTRAM) 50 MG tablet TAKE (1) TABLET TWICE A DAY AS NEEDED. 11/25/17   Janith Lima, MD  tretinoin (RETIN-A) 0.05 % cream Once daily     [provider]  vitamin C (ASCORBIC ACID) 500 MG tablet Take 500 mg by mouth daily.    [provider]  XARELTO 20 MG TABS tablet TAKE 1 TABLET DAILY WITH SUPPER. 11/14/17   Bensimhon,  Shaune Pascal, MD    Family History Family History  Problem Relation Age of Onset  . Hypertension Mother   . Cancer Father        throat  . Cancer Sister        breast (sister)    Social History Social History  Tobacco Use  . Smoking status: Former Smoker    Last attempt to quit: 08/27/1974    Years since quitting: 43.4  . Smokeless tobacco: Never Used  Substance Use Topics  . Alcohol use: No    Comment: occasionally  . Drug use: No     Allergies   Patient has no known allergies.   Review of Systems Review of Systems  Musculoskeletal:       Bilateral feet, knee, and hand pain  All other systems reviewed and are negative.    Physical Exam Updated Vital Signs BP 132/60 (BP Location: Left Arm)   Pulse 60   Temp 98.4 F (36.9 C) (Oral)   Resp 16   Ht 5' 3"  (1.6 m)   Wt 73.9 kg (163 lb)   SpO2 100%   BMI 28.87 kg/m   Physical Exam  Constitutional: She is oriented to person, place, and time. She appears well-developed and well-nourished.  HENT:  Head: Normocephalic and atraumatic.  Right Ear: External ear normal.  Left Ear: External ear normal.  Nose: Nose normal.  Mouth/Throat: Oropharynx is clear and moist.  Eyes: Pupils are equal, round, and reactive to light. Conjunctivae and EOM are normal.  Neck: Normal range of motion. Neck supple.  Cardiovascular: Normal rate, regular rhythm, normal heart sounds and intact distal pulses.  Pulmonary/Chest: Effort normal and breath sounds normal.  Abdominal: Soft. Bowel sounds are normal.  Musculoskeletal: Normal range of motion.       Right knee: Tenderness found.       Left knee: Tenderness found.       Right hand: She exhibits tenderness.       Left hand: She exhibits tenderness.       Right foot: There is tenderness.       Left foot: There is tenderness.  No deformities noted.  Neurological: She is alert and oriented to person, place, and time.  Skin: Skin is warm. Capillary refill takes less than 2 seconds.    Psychiatric: She has a normal mood and affect. Her behavior is normal. Judgment and thought content normal.  Nursing note and vitals reviewed.    ED Treatments / Results  Labs (all labs ordered are listed, but only abnormal results are displayed) Labs Reviewed - No data to display  EKG None  Radiology Dg Knee Complete 4 Views Left  Result Date: 01/18/2018 CLINICAL DATA:  Left knee pain after falling down 2 stairs yesterday. EXAM: LEFT KNEE - COMPLETE 4+ VIEW COMPARISON:  07/19/2017.  Left knee MR dated 10/29/2005 FINDINGS: Again demonstrated is mild medial joint space narrowing and minimal spur formation involving all 3 joint spaces. No fracture, dislocation or effusion. IMPRESSION: No fracture.  Minimal tricompartmental degenerative changes. Electronically Signed   By: Claudie Revering M.D.   On: 01/18/2018 16:30   Dg Knee Complete 4 Views Right  Result Date: 01/18/2018 CLINICAL DATA:  Right knee pain following a fall down 2 steps yesterday. EXAM: RIGHT KNEE - COMPLETE 4+ VIEW COMPARISON:  10/22/2012. FINDINGS: Mild medial joint space narrowing and minimal associated spur formation. Mild posterior patellar spur formation. No fracture, dislocation or effusion. IMPRESSION: No fracture.  Mild degenerative changes. Electronically Signed   By: Claudie Revering M.D.   On: 01/18/2018 16:31   Dg Hand Complete Left  Result Date: 01/18/2018 CLINICAL DATA:  Left hand pain after falling down 2 steps yesterday. EXAM: LEFT HAND - COMPLETE 3+ VIEW COMPARISON:  None. FINDINGS: Degenerative changes at the articulation of the trapezium  and trapezoid with the scaphoid. Degenerative spur formation involving the 1st IP joint and the 2nd, 3rd and 5th DIP joints. Oval calcification adjacent to the distal aspect of the ulna on the frontal view. No fracture or dislocation. IMPRESSION: No fracture.  Multi joint degenerative changes. Electronically Signed   By: Claudie Revering M.D.   On: 01/18/2018 16:34   Dg Hand Complete  Right  Result Date: 01/18/2018 CLINICAL DATA:  Right hand pain after falling down 2 steps yesterday. EXAM: RIGHT HAND - COMPLETE 3+ VIEW COMPARISON:  10/26/2005. FINDINGS: Two screws in the base of the 5th proximal phalanx. Joint space narrowing and moderate spur formation involving the 1st IP joint and 2nd MCP joint. There also degenerative changes involving the 2nd, 3rd and 5th DIP joints. IMPRESSION: No fracture.  Multi joint degenerative changes. Electronically Signed   By: Claudie Revering M.D.   On: 01/18/2018 16:36   Dg Foot Complete Left  Result Date: 01/18/2018 CLINICAL DATA:  Left foot pain after falling down 2 stairs yesterday. EXAM: LEFT FOOT - COMPLETE 3+ VIEW COMPARISON:  10/20/2016. FINDINGS: Again demonstrated is a mild hallux valgus deformity with degenerative spur formation at the 1st MTP joint and 1st IP joint. Moderate calcaneal spur formation. No fracture or dislocation. IMPRESSION: 1. No fracture. 2. Hallux valgus and degenerative changes, as described above. Electronically Signed   By: Claudie Revering M.D.   On: 01/18/2018 16:26   Dg Foot Complete Right  Result Date: 01/18/2018 CLINICAL DATA:  Right foot pain following a fall down 2 steps yesterday. EXAM: RIGHT FOOT COMPLETE - 3+ VIEW COMPARISON:  07/19/2017. FINDINGS: Minimal hallux valgus deformity and minimal spur formation at the 1st MTP joint. Mild to moderate spur formation at the 1st IP joint. Moderate calcaneal spur formation. No fracture or dislocation seen. IMPRESSION: 1. No fracture. 2. Degenerative changes, as described above. Electronically Signed   By: Claudie Revering M.D.   On: 01/18/2018 16:28    Procedures Procedures (including critical care time)  Medications Ordered in ED Medications - No data to display   Initial Impression / Assessment and Plan / ED Course  I have reviewed the triage vital signs and the nursing notes.  Pertinent labs & imaging results that were available during my care of the patient were  reviewed by me and considered in my medical decision making (see chart for details).    Pt is looking good.  She is ambulating.  No fractures.  Return if worse.  F/u with pcp.  Final Clinical Impressions(s) / ED Diagnoses   Final diagnoses:  Fall, initial encounter  Contusion of right hand, initial encounter  Contusion of left hand, initial encounter  Contusion of left knee, initial encounter  Contusion of right knee, initial encounter  Strain of right foot, initial encounter  Strain of foot, left, initial encounter    ED Discharge Orders    None       Isla Pence, MD 01/18/18 1654

## 2018-01-18 NOTE — ED Triage Notes (Signed)
Patient states that she fell down the stairs yesterday falling onto her back - the patient states that she has pain to her bilateral feet and right hand

## 2018-01-27 ENCOUNTER — Ambulatory Visit: Payer: Medicare Other | Admitting: Internal Medicine

## 2018-01-29 ENCOUNTER — Ambulatory Visit: Payer: Medicare Other | Admitting: Internal Medicine

## 2018-01-30 ENCOUNTER — Other Ambulatory Visit: Payer: Self-pay | Admitting: Internal Medicine

## 2018-01-30 DIAGNOSIS — K219 Gastro-esophageal reflux disease without esophagitis: Secondary | ICD-10-CM

## 2018-01-30 DIAGNOSIS — I10 Essential (primary) hypertension: Secondary | ICD-10-CM

## 2018-02-07 DIAGNOSIS — H401131 Primary open-angle glaucoma, bilateral, mild stage: Secondary | ICD-10-CM | POA: Diagnosis not present

## 2018-02-07 DIAGNOSIS — H02403 Unspecified ptosis of bilateral eyelids: Secondary | ICD-10-CM | POA: Diagnosis not present

## 2018-02-07 DIAGNOSIS — H04123 Dry eye syndrome of bilateral lacrimal glands: Secondary | ICD-10-CM | POA: Diagnosis not present

## 2018-02-18 ENCOUNTER — Other Ambulatory Visit (INDEPENDENT_AMBULATORY_CARE_PROVIDER_SITE_OTHER): Payer: Medicare Other

## 2018-02-18 ENCOUNTER — Encounter: Payer: Self-pay | Admitting: Internal Medicine

## 2018-02-18 ENCOUNTER — Ambulatory Visit (INDEPENDENT_AMBULATORY_CARE_PROVIDER_SITE_OTHER): Payer: Medicare Other | Admitting: Internal Medicine

## 2018-02-18 VITALS — BP 122/76 | HR 56 | Temp 98.8°F | Ht 63.0 in | Wt 163.0 lb

## 2018-02-18 DIAGNOSIS — E559 Vitamin D deficiency, unspecified: Secondary | ICD-10-CM

## 2018-02-18 DIAGNOSIS — M150 Primary generalized (osteo)arthritis: Secondary | ICD-10-CM | POA: Diagnosis not present

## 2018-02-18 DIAGNOSIS — E7800 Pure hypercholesterolemia, unspecified: Secondary | ICD-10-CM | POA: Diagnosis not present

## 2018-02-18 DIAGNOSIS — E538 Deficiency of other specified B group vitamins: Secondary | ICD-10-CM | POA: Diagnosis not present

## 2018-02-18 DIAGNOSIS — I10 Essential (primary) hypertension: Secondary | ICD-10-CM

## 2018-02-18 DIAGNOSIS — M8949 Other hypertrophic osteoarthropathy, multiple sites: Secondary | ICD-10-CM

## 2018-02-18 DIAGNOSIS — L280 Lichen simplex chronicus: Secondary | ICD-10-CM

## 2018-02-18 DIAGNOSIS — I83811 Varicose veins of right lower extremities with pain: Secondary | ICD-10-CM

## 2018-02-18 DIAGNOSIS — I480 Paroxysmal atrial fibrillation: Secondary | ICD-10-CM

## 2018-02-18 DIAGNOSIS — M818 Other osteoporosis without current pathological fracture: Secondary | ICD-10-CM

## 2018-02-18 DIAGNOSIS — M159 Polyosteoarthritis, unspecified: Secondary | ICD-10-CM

## 2018-02-18 DIAGNOSIS — K219 Gastro-esophageal reflux disease without esophagitis: Secondary | ICD-10-CM

## 2018-02-18 LAB — CBC WITH DIFFERENTIAL/PLATELET
Basophils Absolute: 0 10*3/uL (ref 0.0–0.1)
Basophils Relative: 0.8 % (ref 0.0–3.0)
Eosinophils Absolute: 0 10*3/uL (ref 0.0–0.7)
Eosinophils Relative: 1 % (ref 0.0–5.0)
HCT: 39.2 % (ref 36.0–46.0)
Hemoglobin: 13.6 g/dL (ref 12.0–15.0)
Lymphocytes Relative: 22 % (ref 12.0–46.0)
Lymphs Abs: 1.1 10*3/uL (ref 0.7–4.0)
MCHC: 34.8 g/dL (ref 30.0–36.0)
MCV: 90.5 fl (ref 78.0–100.0)
Monocytes Absolute: 0.4 10*3/uL (ref 0.1–1.0)
Monocytes Relative: 7.4 % (ref 3.0–12.0)
Neutro Abs: 3.3 10*3/uL (ref 1.4–7.7)
Neutrophils Relative %: 68.8 % (ref 43.0–77.0)
Platelets: 222 10*3/uL (ref 150.0–400.0)
RBC: 4.33 Mil/uL (ref 3.87–5.11)
RDW: 12.7 % (ref 11.5–15.5)
WBC: 4.8 10*3/uL (ref 4.0–10.5)

## 2018-02-18 LAB — LIPID PANEL
Cholesterol: 157 mg/dL (ref 0–200)
HDL: 41.1 mg/dL (ref >39.00)
LDL Cholesterol: 97 mg/dL (ref 0–99)
NonHDL: 116.08
Total CHOL/HDL Ratio: 4
Triglycerides: 94 mg/dL (ref 0.0–149.0)
VLDL: 18.8 mg/dL (ref 0.0–40.0)

## 2018-02-18 LAB — COMPREHENSIVE METABOLIC PANEL
ALT: 19 U/L (ref 0–35)
AST: 18 U/L (ref 0–37)
Albumin: 4.5 g/dL (ref 3.5–5.2)
Alkaline Phosphatase: 96 U/L (ref 39–117)
BUN: 13 mg/dL (ref 6–23)
CO2: 30 mEq/L (ref 19–32)
Calcium: 9.7 mg/dL (ref 8.4–10.5)
Chloride: 101 mEq/L (ref 96–112)
Creatinine, Ser: 0.76 mg/dL (ref 0.40–1.20)
GFR: 79.22 mL/min (ref >60.00)
Glucose, Bld: 104 mg/dL — ABNORMAL HIGH (ref 70–99)
Potassium: 3.7 mEq/L (ref 3.5–5.1)
Sodium: 139 mEq/L (ref 135–145)
Total Bilirubin: 0.8 mg/dL (ref 0.2–1.2)
Total Protein: 7.5 g/dL (ref 6.0–8.3)

## 2018-02-18 LAB — MAGNESIUM: Magnesium: 2 mg/dL (ref 1.5–2.5)

## 2018-02-18 LAB — URINALYSIS, ROUTINE W REFLEX MICROSCOPIC
Bilirubin Urine: NEGATIVE
Ketones, ur: NEGATIVE
Leukocytes, UA: NEGATIVE
Nitrite: NEGATIVE
Specific Gravity, Urine: 1.01 (ref 1.000–1.030)
Total Protein, Urine: NEGATIVE
Urine Glucose: NEGATIVE
Urobilinogen, UA: 0.2 (ref 0.0–1.0)
WBC, UA: NONE SEEN (ref 0–?)
pH: 7 (ref 5.0–8.0)

## 2018-02-18 LAB — VITAMIN B12: Vitamin B-12: 576 pg/mL (ref 211–911)

## 2018-02-18 LAB — VITAMIN D 25 HYDROXY (VIT D DEFICIENCY, FRACTURES): VITD: 36.91 ng/mL (ref 30.00–100.00)

## 2018-02-18 LAB — TSH: TSH: 2.19 u[IU]/mL (ref 0.35–4.50)

## 2018-02-18 LAB — FOLATE: Folate: >24.1 ng/mL (ref >5.9)

## 2018-02-18 MED ORDER — PRAVASTATIN SODIUM 40 MG PO TABS: 40.0000 mg | ORAL_TABLET | Freq: Every day | ORAL | 1 refills | Status: DC

## 2018-02-18 MED ORDER — TELMISARTAN-HCTZ 80-12.5 MG PO TABS: 1.0000 | ORAL_TABLET | Freq: Every day | ORAL | 1 refills | Status: DC

## 2018-02-18 MED ORDER — TRAMADOL HCL 50 MG PO TABS: 50.0000 mg | ORAL_TABLET | Freq: Four times a day (QID) | ORAL | 3 refills | Status: DC | PRN

## 2018-02-18 MED ORDER — RANITIDINE HCL 300 MG PO TABS: 300.0000 mg | ORAL_TABLET | Freq: Every day | ORAL | 1 refills | Status: DC

## 2018-02-18 NOTE — Progress Notes (Signed)
Subjective:  Patient ID: Colleen White, female    DOB: 1945/11/09  Age: 72 y.o. MRN: [id number]  CC: Hypertension; Hyperlipidemia; and Atrial Fibrillation   HPI Colleen White presents for f/up - She tells me her blood pressure has been well controlled and she denies any recent episodes of CP, DOE, palpitations, edema, or fatigue.  She tells me she saw a urologist earlier this year for microscopic hematuria and says that a CT scan of her kidneys and cystoscopy were unremarkable.  She denies any episodes of abdominal or flank pain.  She is concerned about unsightly varicose veins on both lower extremities.  She feels like these have worsened over the last few years and she wants to see a vascular surgeon about this.  Outpatient Medications Prior to Visit  Medication Sig Dispense Refill  . brimonidine-timolol (COMBIGAN) 0.2-0.5 % ophthalmic solution Combigan 0.2 %-0.5 % eye drops    . Calcium Carbonate-Vitamin D (CALCIUM 500 + D PO) Take 1 tablet by mouth 2 (two) times daily.    . Cholecalciferol (VITAMIN D3) 1000 units CAPS Take by mouth.    . diltiazem (CARDIZEM CD) 120 MG 24 hr capsule TAKE (1) CAPSULE DAILY. 30 capsule 11  . gabapentin (NEURONTIN) 100 MG capsule Take 1 capsule (100 mg total) daily by mouth. (Patient taking differently: Take 400 mg by mouth at bedtime. ) 90 capsule 0  . loratadine (CLARITIN) 10 MG tablet Take by mouth.    [name] MINOXIDIL FOR WOMEN EX Apply topically.    . Multiple Vitamins-Minerals (PRESERVISION AREDS 2 PO) Take 1 tablet by mouth 2 (two) times daily.     . Probiotic Product (PROBIOTIC DAILY PO) Take 1 tablet by mouth daily.    [name] tretinoin (RETIN-A) 0.05 % cream Once daily     . vitamin C (ASCORBIC ACID) 500 MG tablet Take 500 mg by mouth daily.    Alveda Reasons 20 MG TABS tablet TAKE 1 TABLET DAILY WITH SUPPER. 30 tablet 11  . Minoxidil 5 % FOAM Apply 3-5 mLs topically at bedtime. Apply to Scalp for hair loss at bedtime     . pravastatin (PRAVACHOL) 40 MG  tablet TAKE 1 TABLET ONCE DAILY. 90 tablet 1  . ranitidine (ZANTAC) 300 MG tablet Take 1 tablet (300 mg total) by mouth at bedtime. 30 tablet 0  . telmisartan-hydrochlorothiazide (MICARDIS HCT) 80-12.5 MG tablet Take 1 tablet by mouth daily. 30 tablet 0  . traMADol (ULTRAM) 50 MG tablet Take by mouth.    . brimonidine-timolol (COMBIGAN) 0.2-0.5 % ophthalmic solution Place 1 drop into both eyes every 12 (twelve) hours.      . Cholecalciferol 2000 units TABS Take 1 tablet (2,000 Units total) by mouth daily. 90 tablet 3  . doxepin (SINEQUAN) 10 MG capsule Take 1 capsule (10 mg total) by mouth at bedtime. 90 capsule 1  . sertraline (ZOLOFT) 50 MG tablet Take 1 tablet (50 mg total) by mouth daily. 90 tablet 1  . traMADol (ULTRAM) 50 MG tablet TAKE (1) TABLET TWICE A DAY AS NEEDED. 60 tablet 2   No facility-administered medications prior to visit.     ROS Review of Systems  Constitutional: Negative.  Negative for appetite change, diaphoresis, fatigue and unexpected weight change.  HENT: Negative.  Negative for trouble swallowing.   Eyes: Negative for visual disturbance.  Respiratory: Negative for cough, chest tightness, shortness of breath and wheezing.   Cardiovascular: Negative for chest pain, palpitations and leg swelling.  Gastrointestinal: Negative for abdominal pain, constipation, diarrhea,  nausea and vomiting.  Endocrine: Negative.   Genitourinary: Negative.  Negative for difficulty urinating.  Musculoskeletal: Positive for arthralgias. Negative for back pain and myalgias.  Skin: Negative.  Negative for color change and rash.  Neurological: Negative.  Negative for dizziness, weakness, light-headedness, numbness and headaches.  Hematological: Negative for adenopathy. Does not bruise/bleed easily.  Psychiatric/Behavioral: Negative for behavioral problems, confusion, decreased concentration, dysphoric mood, sleep disturbance and suicidal ideas. The patient is nervous/anxious.      Objective:  BP 122/76 (BP Location: Left Arm, Patient Position: Sitting, Cuff Size: Normal)   Pulse (!) 56   Temp 98.8 F (37.1 C) (Oral)   Ht 5\' 3"  (1.6 m)   Wt 163 lb (73.9 kg)   SpO2 99%   BMI 28.87 kg/m   BP Readings from Last 3 Encounters:  02/18/18 122/76  01/18/18 132/60  07/19/17 126/68    Wt Readings from Last 3 Encounters:  02/18/18 163 lb (73.9 kg)  01/18/18 163 lb (73.9 kg)  07/19/17 163 lb (73.9 kg)    Physical Exam  Constitutional: She is oriented to person, place, and time. No distress.  HENT:  Mouth/Throat: Oropharynx is clear and moist. No oropharyngeal exudate.  Eyes: Conjunctivae are normal. No scleral icterus.  Neck: Normal range of motion. Neck supple. No JVD present. No thyromegaly present.  Cardiovascular: Normal rate, regular rhythm and normal heart sounds. Exam reveals no friction rub.  No murmur heard. Pulmonary/Chest: Effort normal and breath sounds normal. She has no wheezes. She has no rales.  Abdominal: Soft. Normal appearance and bowel sounds are normal. She exhibits no mass. There is no hepatosplenomegaly. There is no tenderness.  Musculoskeletal: Normal range of motion. She exhibits no edema, tenderness or deformity.  Uncomplicated, small to medium sized varicosities noted over both lower extremities.  Lymphadenopathy:    She has no cervical adenopathy.  Neurological: She is alert and oriented to person, place, and time.  Skin: Skin is warm and dry. She is not diaphoretic. No pallor.  Psychiatric: She has a normal mood and affect. Her behavior is normal. Judgment and thought content normal.  Vitals reviewed.   Lab Results  Component Value Date   WBC 4.8 02/18/2018   HGB 13.6 02/18/2018   HCT 39.2 02/18/2018   PLT 222.0 02/18/2018   GLUCOSE 104 (H) 02/18/2018   CHOL 157 02/18/2018   TRIG 94.0 02/18/2018   HDL 41.10 02/18/2018   LDLDIRECT 142.5 09/30/2012   LDLCALC 97 02/18/2018   ALT 19 02/18/2018   AST 18 02/18/2018   NA  139 02/18/2018   K 3.7 02/18/2018   CL 101 02/18/2018   CREATININE 0.76 02/18/2018   BUN 13 02/18/2018   CO2 30 02/18/2018   TSH 2.19 02/18/2018   HGBA1C 5.7 06/25/2017    Dg Knee Complete 4 Views Left  Result Date: 01/18/2018 CLINICAL DATA:  Left knee pain after falling down 2 stairs yesterday. EXAM: LEFT KNEE - COMPLETE 4+ VIEW COMPARISON:  07/19/2017.  Left knee MR dated 10/29/2005 FINDINGS: Again demonstrated is mild medial joint space narrowing and minimal spur formation involving all 3 joint spaces. No fracture, dislocation or effusion. IMPRESSION: No fracture.  Minimal tricompartmental degenerative changes. Electronically Signed   By: Claudie Revering M.D.   On: 01/18/2018 16:30   Dg Knee Complete 4 Views Right  Result Date: 01/18/2018 CLINICAL DATA:  Right knee pain following a fall down 2 steps yesterday. EXAM: RIGHT KNEE - COMPLETE 4+ VIEW COMPARISON:  10/22/2012. FINDINGS: Mild medial joint space  narrowing and minimal associated spur formation. Mild posterior patellar spur formation. No fracture, dislocation or effusion. IMPRESSION: No fracture.  Mild degenerative changes. Electronically Signed   By: Claudie Revering M.D.   On: 01/18/2018 16:31   Dg Hand Complete Left  Result Date: 01/18/2018 CLINICAL DATA:  Left hand pain after falling down 2 steps yesterday. EXAM: LEFT HAND - COMPLETE 3+ VIEW COMPARISON:  None. FINDINGS: Degenerative changes at the articulation of the trapezium and trapezoid with the scaphoid. Degenerative spur formation involving the 1st IP joint and the 2nd, 3rd and 5th DIP joints. Oval calcification adjacent to the distal aspect of the ulna on the frontal view. No fracture or dislocation. IMPRESSION: No fracture.  Multi joint degenerative changes. Electronically Signed   By: Claudie Revering M.D.   On: 01/18/2018 16:34   Dg Hand Complete Right  Result Date: 01/18/2018 CLINICAL DATA:  Right hand pain after falling down 2 steps yesterday. EXAM: RIGHT HAND - COMPLETE 3+ VIEW  COMPARISON:  10/26/2005. FINDINGS: Two screws in the base of the 5th proximal phalanx. Joint space narrowing and moderate spur formation involving the 1st IP joint and 2nd MCP joint. There also degenerative changes involving the 2nd, 3rd and 5th DIP joints. IMPRESSION: No fracture.  Multi joint degenerative changes. Electronically Signed   By: Claudie Revering M.D.   On: 01/18/2018 16:36   Dg Foot Complete Left  Result Date: 01/18/2018 CLINICAL DATA:  Left foot pain after falling down 2 stairs yesterday. EXAM: LEFT FOOT - COMPLETE 3+ VIEW COMPARISON:  10/20/2016. FINDINGS: Again demonstrated is a mild hallux valgus deformity with degenerative spur formation at the 1st MTP joint and 1st IP joint. Moderate calcaneal spur formation. No fracture or dislocation. IMPRESSION: 1. No fracture. 2. Hallux valgus and degenerative changes, as described above. Electronically Signed   By: Claudie Revering M.D.   On: 01/18/2018 16:26   Dg Foot Complete Right  Result Date: 01/18/2018 CLINICAL DATA:  Right foot pain following a fall down 2 steps yesterday. EXAM: RIGHT FOOT COMPLETE - 3+ VIEW COMPARISON:  07/19/2017. FINDINGS: Minimal hallux valgus deformity and minimal spur formation at the 1st MTP joint. Mild to moderate spur formation at the 1st IP joint. Moderate calcaneal spur formation. No fracture or dislocation seen. IMPRESSION: 1. No fracture. 2. Degenerative changes, as described above. Electronically Signed   By: Claudie Revering M.D.   On: 01/18/2018 16:28    Assessment & Plan:   Vernessa was seen today for hypertension, hyperlipidemia and atrial fibrillation.  Diagnoses and all orders for this visit:  Essential hypertension- Her blood pressure is adequately well controlled.  Electrolytes and renal function are normal. -     CBC with Differential/Platelet; Future -     Comprehensive metabolic panel; Future -     Urinalysis, Routine w reflex microscopic; Future -     Magnesium; Future -      telmisartan-hydrochlorothiazide (MICARDIS HCT) 80-12.5 MG tablet; Take 1 tablet by mouth daily.  Paroxysmal atrial fibrillation ([place])- She is maintaining normal rate and rhythm. -     TSH; Future  HYPERCHOLESTEROLEMIA- She has achieved her LDL goal and is doing well on the statin. -     Lipid panel; Future -     TSH; Future -     pravastatin (PRAVACHOL) 40 MG tablet; Take 1 tablet (40 mg total) by mouth daily.  Vitamin D deficiency -     VITAMIN D 25 Hydroxy (Vit-D Deficiency, Fractures); Future  Varicose veins of right lower  extremity with pain -     Ambulatory referral to Vascular Surgery  Neurodermatitis- No secondary causes noted for this. -     Zinc; Future  Other osteoporosis, unspecified pathological fracture presence-she is not willing to take a medication for this. -     Zinc; Future -     Magnesium; Future  B12 deficiency due to diet -     Vitamin B12; Future -     Folate; Future -     ranitidine (ZANTAC) 300 MG tablet; Take 1 tablet (300 mg total) by mouth at bedtime.  Gastroesophageal reflux disease without esophagitis -     ranitidine (ZANTAC) 300 MG tablet; Take 1 tablet (300 mg total) by mouth at bedtime.  Primary osteoarthritis involving multiple joints -     traMADol (ULTRAM) 50 MG tablet; Take 1 tablet (50 mg total) by mouth every 6 (six) hours as needed.   I have discontinued Colleen White's Minoxidil, doxepin, Cholecalciferol, sertraline, and traMADol. I have also changed her traMADol and pravastatin. Additionally, I am having her maintain her tretinoin, Multiple Vitamins-Minerals (PRESERVISION AREDS 2 PO), vitamin C, Probiotic Product (PROBIOTIC DAILY PO), Calcium Carbonate-Vitamin D (CALCIUM 500 + D PO), gabapentin, diltiazem, XARELTO, brimonidine-timolol, loratadine, MINOXIDIL FOR WOMEN EX, Vitamin D3, telmisartan-hydrochlorothiazide, and ranitidine.  Meds ordered this encounter  Medications  . traMADol (ULTRAM) 50 MG tablet    Sig: Take 1 tablet (50  mg total) by mouth every 6 (six) hours as needed.    Dispense:  65 tablet    Refill:  3  . telmisartan-hydrochlorothiazide (MICARDIS HCT) 80-12.5 MG tablet    Sig: Take 1 tablet by mouth daily.    Dispense:  90 tablet    Refill:  1  . pravastatin (PRAVACHOL) 40 MG tablet    Sig: Take 1 tablet (40 mg total) by mouth daily.    Dispense:  90 tablet    Refill:  1  . ranitidine (ZANTAC) 300 MG tablet    Sig: Take 1 tablet (300 mg total) by mouth at bedtime.    Dispense:  90 tablet    Refill:  1     Follow-up: Return in about 6 months (around 08/20/2018).  Scarlette Calico, MD

## 2018-02-18 NOTE — Patient Instructions (Signed)

## 2018-02-20 ENCOUNTER — Telehealth: Payer: Self-pay | Admitting: *Deleted

## 2018-02-20 ENCOUNTER — Other Ambulatory Visit (INDEPENDENT_AMBULATORY_CARE_PROVIDER_SITE_OTHER): Payer: Medicare Other

## 2018-02-20 ENCOUNTER — Encounter: Payer: Self-pay | Admitting: Internal Medicine

## 2018-02-20 DIAGNOSIS — Z1211 Encounter for screening for malignant neoplasm of colon: Secondary | ICD-10-CM

## 2018-02-20 LAB — HEMOCCULT SLIDES (X 3 CARDS)
Fecal Occult Blood: NEGATIVE
OCCULT 1: NEGATIVE
OCCULT 2: NEGATIVE
OCCULT 3: NEGATIVE
OCCULT 4: NEGATIVE
OCCULT 5: NEGATIVE

## 2018-02-20 LAB — ZINC: Zinc: 107 ug/dL (ref 60–130)

## 2018-02-20 NOTE — Telephone Encounter (Signed)
[place] lab calling requesting order for hemoccult cards. Pt has dropped them off.  Orders placed. See labs.

## 2018-03-06 ENCOUNTER — Other Ambulatory Visit: Payer: Self-pay

## 2018-03-06 DIAGNOSIS — L578 Other skin changes due to chronic exposure to nonionizing radiation: Secondary | ICD-10-CM | POA: Diagnosis not present

## 2018-03-06 DIAGNOSIS — L821 Other seborrheic keratosis: Secondary | ICD-10-CM | POA: Diagnosis not present

## 2018-03-06 DIAGNOSIS — I83811 Varicose veins of right lower extremities with pain: Secondary | ICD-10-CM

## 2018-03-06 DIAGNOSIS — L280 Lichen simplex chronicus: Secondary | ICD-10-CM | POA: Diagnosis not present

## 2018-03-06 DIAGNOSIS — L570 Actinic keratosis: Secondary | ICD-10-CM | POA: Diagnosis not present

## 2018-03-06 DIAGNOSIS — L814 Other melanin hyperpigmentation: Secondary | ICD-10-CM | POA: Diagnosis not present

## 2018-03-06 DIAGNOSIS — D1801 Hemangioma of skin and subcutaneous tissue: Secondary | ICD-10-CM | POA: Diagnosis not present

## 2018-03-26 ENCOUNTER — Other Ambulatory Visit: Payer: Self-pay

## 2018-04-23 DIAGNOSIS — R350 Frequency of micturition: Secondary | ICD-10-CM | POA: Diagnosis not present

## 2018-04-23 DIAGNOSIS — R109 Unspecified abdominal pain: Secondary | ICD-10-CM | POA: Diagnosis not present

## 2018-04-23 DIAGNOSIS — R8290 Unspecified abnormal findings in urine: Secondary | ICD-10-CM | POA: Diagnosis not present

## 2018-04-24 ENCOUNTER — Other Ambulatory Visit (INDEPENDENT_AMBULATORY_CARE_PROVIDER_SITE_OTHER): Payer: Medicare Other

## 2018-04-24 ENCOUNTER — Ambulatory Visit (INDEPENDENT_AMBULATORY_CARE_PROVIDER_SITE_OTHER): Payer: Medicare Other | Admitting: Gastroenterology

## 2018-04-24 ENCOUNTER — Encounter: Payer: Self-pay | Admitting: Gastroenterology

## 2018-04-24 VITALS — BP 120/78 | HR 72 | Ht 63.0 in | Wt 163.2 lb

## 2018-04-24 DIAGNOSIS — R208 Other disturbances of skin sensation: Secondary | ICD-10-CM | POA: Diagnosis not present

## 2018-04-24 DIAGNOSIS — R197 Diarrhea, unspecified: Secondary | ICD-10-CM | POA: Diagnosis not present

## 2018-04-24 DIAGNOSIS — R1032 Left lower quadrant pain: Secondary | ICD-10-CM

## 2018-04-24 DIAGNOSIS — L821 Other seborrheic keratosis: Secondary | ICD-10-CM | POA: Diagnosis not present

## 2018-04-24 DIAGNOSIS — L658 Other specified nonscarring hair loss: Secondary | ICD-10-CM | POA: Diagnosis not present

## 2018-04-24 LAB — CBC WITH DIFFERENTIAL/PLATELET
Basophils Absolute: 0 10*3/uL (ref 0.0–0.1)
Basophils Relative: 0.5 % (ref 0.0–3.0)
Eosinophils Absolute: 0.1 10*3/uL (ref 0.0–0.7)
Eosinophils Relative: 1.9 % (ref 0.0–5.0)
HCT: 40.4 % (ref 36.0–46.0)
Hemoglobin: 14 g/dL (ref 12.0–15.0)
Lymphocytes Relative: 26 % (ref 12.0–46.0)
Lymphs Abs: 1.4 10*3/uL (ref 0.7–4.0)
MCHC: 34.7 g/dL (ref 30.0–36.0)
MCV: 90.9 fl (ref 78.0–100.0)
Monocytes Absolute: 0.5 10*3/uL (ref 0.1–1.0)
Monocytes Relative: 9.4 % (ref 3.0–12.0)
Neutro Abs: 3.3 10*3/uL (ref 1.4–7.7)
Neutrophils Relative %: 62.2 % (ref 43.0–77.0)
Platelets: 231 10*3/uL (ref 150.0–400.0)
RBC: 4.44 Mil/uL (ref 3.87–5.11)
RDW: 13 % (ref 11.5–15.5)
WBC: 5.4 10*3/uL (ref 4.0–10.5)

## 2018-04-24 LAB — BASIC METABOLIC PANEL
BUN: 13 mg/dL (ref 6–23)
CO2: 34 mEq/L — ABNORMAL HIGH (ref 19–32)
Calcium: 9.9 mg/dL (ref 8.4–10.5)
Chloride: 100 mEq/L (ref 96–112)
Creatinine, Ser: 0.84 mg/dL (ref 0.40–1.20)
GFR: 70.54 mL/min (ref >60.00)
Glucose, Bld: 103 mg/dL — ABNORMAL HIGH (ref 70–99)
Potassium: 3.9 mEq/L (ref 3.5–5.1)
Sodium: 140 mEq/L (ref 135–145)

## 2018-04-24 LAB — HEPATIC FUNCTION PANEL
ALT: 19 U/L (ref 0–35)
AST: 17 U/L (ref 0–37)
Albumin: 4.4 g/dL (ref 3.5–5.2)
Alkaline Phosphatase: 88 U/L (ref 39–117)
Bilirubin, Direct: 0.1 mg/dL (ref 0.0–0.3)
Total Bilirubin: 0.7 mg/dL (ref 0.2–1.2)
Total Protein: 7.5 g/dL (ref 6.0–8.3)

## 2018-04-24 LAB — TSH: TSH: 2.01 u[IU]/mL (ref 0.35–4.50)

## 2018-04-24 MED ORDER — DICYCLOMINE HCL 10 MG PO CAPS: 10.0000 mg | ORAL_CAPSULE | Freq: Three times a day (TID) | ORAL | 11 refills | Status: DC

## 2018-04-24 NOTE — Progress Notes (Signed)
History of Present Illness: This is a 72 year old female self referred for the evaluation of left lower quadrant pain and diarrhea.  She relates the onset of a mild left lower quadrant pain last week and then on Sunday developed worsening left lower quadrant abdominal pain associated with frequent watery diarrhea and nausea.  The diarrhea abated after about 24 hours and her LLQ abdominal pain persists at a mild level.  She denies fevers, chills.  She has recently been evaluated for hematuria by her gynecologist and Dr. Kathie Rhodes.  CT scan of the abdomen and pelvis performed with and without contrast on November 07, 2017 showed left colonic diverticulosis without diverticulitis, aortic atherosclerosis and no CT findings to explain hematuria.  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  She is maintained on Xarelto.  She was previously followed at [facility], most recently by Melvyn Neth, MD and [place] office visits in 2014 and 2016 were reviewed.  Colonoscopies performed in 2005, 2010 and 2012 were reviewed.  Last colonoscopy performed in January 2012 showed a mild angulation with muscular hypertrophy in the sigmoid colon at approximately 25 cm from anal verge.  Small and large mild diverticula were noted in the sigmoid colon.  A 4 mm sessile polyp removed from the ascending colon was hyperplastic on pathology.  Terminal ileum was examined and was normal.  No other findings noted.  The total procedure time was 38 minutes 11 seconds with the scope withdrawal time of 13 minutes 47 seconds.  A 10-year interval screening colonoscopy was recommended. Denies weight loss, constipation, change in stool caliber, melena, hematochezia, nausea, vomiting, dysphagia, reflux symptoms, chest pain.    No Known Allergies Outpatient Medications Prior to Visit  Medication Sig Dispense Refill  . brimonidine-timolol (COMBIGAN) 0.2-0.5 % ophthalmic solution Combigan 0.2 %-0.5 %  eye drops    . Calcium Carbonate-Vitamin D (CALCIUM 500 + D PO) Take 1 tablet by mouth 2 (two) times daily.    . Cholecalciferol (VITAMIN D3) 1000 units CAPS Take by mouth.    . diltiazem (CARDIZEM CD) 120 MG 24 hr capsule TAKE (1) CAPSULE DAILY. 30 capsule 11  . docusate sodium (COLACE) 100 MG capsule Take 100 mg by mouth at bedtime.    . gabapentin (NEURONTIN) 100 MG capsule Take 1 capsule (100 mg total) daily by mouth. (Patient taking differently: Take 400 mg by mouth at bedtime. ) 90 capsule 0  . loratadine (CLARITIN) 10 MG tablet Take by mouth.    [name] MINOXIDIL FOR WOMEN EX Apply topically. Applies to scalp for hairloss    . mirtazapine (REMERON) 15 MG tablet Take 15 mg by mouth at bedtime. Takes 1/2 tablet at bedtime for itching of scalp    . Multiple Vitamins-Minerals (PRESERVISION AREDS 2 PO) Take 1 tablet by mouth 2 (two) times daily.     . pravastatin (PRAVACHOL) 40 MG tablet Take 1 tablet (40 mg total) by mouth daily. 90 tablet 1  . Probiotic Product (PROBIOTIC DAILY PO) Take 1 tablet by mouth daily.    . ranitidine (ZANTAC) 300 MG tablet Take 1 tablet (300 mg total) by mouth at bedtime. 90 tablet 1  . telmisartan-hydrochlorothiazide (MICARDIS HCT) 80-12.5 MG tablet Take 1 tablet by mouth daily. 90 tablet 1  . traMADol (ULTRAM) 50 MG tablet Take 1 tablet (50 mg total) by mouth every 6 (six) hours as needed. 65 tablet 3  . tretinoin (RETIN-A) 0.05 % cream  Once daily     . vitamin C (ASCORBIC ACID) 500 MG tablet Take 500 mg by mouth daily.    Alveda Reasons 20 MG TABS tablet TAKE 1 TABLET DAILY WITH SUPPER. 30 tablet 11   No facility-administered medications prior to visit.    Past Medical History:  Diagnosis Date  . Allergic rhinitis, cause unspecified   . Alopecia   . Anxiety   . Colon polyps   . Diverticulosis of colon (without mention of hemorrhage)   . Fibrocystic breast disease   . GERD (gastroesophageal reflux disease)   . Headache(784.0)   . Hemorrhoids   . History of  echocardiogram    Echo 3/17:  Vigorous LVF, EF 65-70%, normal wall motion, grade 1 diastolic, mildly elevated LVOT velocity of 2.2 m/s-likely related to vigorous LVF  . History of stress test    Myoview normal in 2/05. //  Stress echo (11/15) with no evidence for ischemia or infarction.  [name] HLD (hyperlipidemia)   . HTN (hypertension)   . Hypertension   . Mitral valve disorders(424.0)   . Non Hodgkin's lymphoma ([place])    Treated at [facility] with surgery and XRT.  Did not have chemotherapy.   . OSA (obstructive sleep apnea) 03/29/2016  . Osteopenia   . PAF (paroxysmal atrial fibrillation) ([place])    a. CHADS2-VASc=3 //  b. Xarelto started in 2017  . Pulmonary nodule    Presumed benign after observation.   [name] Unspecified glaucoma(365.9)    Past Surgical History:  Procedure Laterality Date  . ABDOMINAL HYSTERECTOMY  1996  . EYE SURGERY  2009   Cataract  . 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 Cloyd Stagers, [facility]  . s/p ORIF prox phalanx  02/2007   right little finger----Dr Fredna Dow  . s/p TAH/BSO     Social History   Socioeconomic History  . Marital status: Married    Spouse name: Not on file  . Number of children: 0  . Years of education: Not on file  . Highest education level: Not on file  Occupational History  . Not on file  Social Needs  . Financial resource strain: Not on file  . Food insecurity:    Worry: Not on file    Inability: Not on file  . Transportation needs:    Medical: Not on file    Non-medical: Not on file  Tobacco Use  . Smoking status: Former Smoker    Last attempt to quit: 08/27/1974    Years since quitting: 43.6  . Smokeless tobacco: Never Used  Substance and Sexual Activity  . Alcohol use: No    Comment: occasionally  . Drug use: No  . Sexual activity: Yes  Lifestyle  . Physical activity:    Days per week: Not on file    Minutes per session: Not on file  . Stress: Not on file  Relationships  . Social connections:    Talks on  phone: Not on file    Gets together: Not on file    Attends religious service: Not on file    Active member of club or organization: Not on file    Attends meetings of clubs or organizations: Not on file    Relationship status: Not on file  Other Topics Concern  . Not on file  Social History Narrative  . Not on file   Family History  Problem Relation Age of Onset  . Hypertension Mother   . Breast cancer Mother   .  Cancer Father        throat  . Diabetes Father   . Heart disease Father   . Prostate cancer Father   . Cancer Sister        breast (sister)  . Colon cancer Maternal Uncle       Review of Systems: Pertinent positive and negative review of systems were noted in the above HPI section. All other review of systems were otherwise negative.   Physical Exam: General: Well developed, well nourished, no acute distress Head: Normocephalic and atraumatic Eyes:  sclerae anicteric, EOMI Ears: Normal auditory acuity Mouth: No deformity or lesions Neck: Supple, no masses or thyromegaly Lungs: Clear throughout to auscultation Heart: Regular rate and rhythm; no murmurs, rubs or bruits Abdomen: Soft, mild LLQ tender and non distended. No masses, hepatosplenomegaly or hernias noted. Normal Bowel sounds Rectal: Not done Musculoskeletal: Symmetrical with no gross deformities  Skin: No lesions on visible extremities Pulses:  Normal pulses noted Extremities: No clubbing, cyanosis, edema or deformities noted Neurological: Alert oriented x 4, grossly nonfocal Cervical Nodes:  No significant cervical adenopathy Inguinal Nodes: No significant inguinal adenopathy Psychological:  Alert and cooperative. Normal mood and affect  Assessment and Recommendations:  1.  Left lower quadrant abdominal pain and recent self-limited diarrhea.  Rule out an infectious diarrhea, diverticulitis, IBS.  CBC, CMET, TSH today.  Schedule abdominal/pelvic CT scan.  Begin dicyclomine 10 mg 3 times daily as  needed.  Consider colonoscopy for further evaluation if no etiology uncovered and symptoms persist.  2. Hematuria.  Recommended that she follow-up with her gynecologist and urologist.  3. CRC screening, average risk.  Hyperplastic colon polyp in January 2012 colonoscopy in January 2022.  4. History of abdominal lymphoma. Follow up as planned with [facility] in [place], [place].   5. GERD.  Follow standard antireflux measures.  Symptoms currently controlled on ranitidine 300 mg daily.

## 2018-04-24 NOTE — Patient Instructions (Signed)
Your provider has requested that you go to the basement level for lab work before leaving today. Press "B" on the elevator. The lab is located at the first door on the left as you exit the elevator.  We have sent the following medications to your pharmacy for you to pick up at your convenience:dicyclomine.   You have been scheduled for a CT scan of the abdomen and pelvis at [facility] ([address]---this is in the same building as [facility]).   You are scheduled on 05/01/18 at 9:30am. You should arrive 15 minutes prior to your appointment time for registration. Please follow the written instructions below on the day of your exam:  WARNING: IF YOU ARE ALLERGIC TO IODINE/X-RAY DYE, PLEASE NOTIFY RADIOLOGY IMMEDIATELY AT [phone]! YOU WILL BE GIVEN A 13 HOUR PREMEDICATION PREP.  1) Do not eat or drink anything after 5:30am (4 hours prior to your test) 2) You have been given 2 bottles of oral contrast to drink. The solution may taste better if refrigerated, but do NOT add ice or any other liquid to this solution. Shake well before drinking.    Drink 1 bottle of contrast @ 7:30am (2 hours prior to your exam)  Drink 1 bottle of contrast @ 8:30am (1 hour prior to your exam)  You may take any medications as prescribed with a small amount of water except for the following: Metformin, Glucophage, Glucovance, Avandamet, Riomet, Fortamet, Actoplus Met, Janumet, Glumetza or Metaglip. The above medications must be held the day of the exam AND 48 hours after the exam.  The purpose of you drinking the oral contrast is to aid in the visualization of your intestinal tract. The contrast solution may cause some diarrhea. Before your exam is started, you will be given a small amount of fluid to drink. Depending on your individual set of symptoms, you may also receive an intravenous injection of x-ray contrast/dye. Plan on being at [facility] for 30 minutes or longer, depending on  the type of exam you are having performed.  This test typically takes 30-45 minutes to complete.  If you have any questions regarding your exam or if you need to reschedule, you may call the CT department at [phone] between the hours of 8:00 am and 5:00 pm, Monday-Friday.  ________________________________________________________________________  Normal BMI (Body Mass Index- based on height and weight) is between 23 and 30. Your BMI today is Body mass index is 28.92 kg/m. [name] Please consider follow up  regarding your BMI with your Primary Care Provider.  Thank you for choosing me and [facility].  Pricilla Riffle. Dagoberto Ligas., MD., Marval Regal

## 2018-05-01 ENCOUNTER — Ambulatory Visit (INDEPENDENT_AMBULATORY_CARE_PROVIDER_SITE_OTHER)
Admission: RE | Admit: 2018-05-01 | Discharge: 2018-05-01 | Disposition: A | Discharge status: Home / Self Care | Payer: Medicare Other | Source: Ambulatory Visit | Attending: Gastroenterology | Admitting: Gastroenterology

## 2018-05-01 DIAGNOSIS — R1032 Left lower quadrant pain: Secondary | ICD-10-CM

## 2018-05-01 DIAGNOSIS — K5730 Diverticulosis of large intestine without perforation or abscess without bleeding: Secondary | ICD-10-CM | POA: Diagnosis not present

## 2018-05-01 DIAGNOSIS — R197 Diarrhea, unspecified: Secondary | ICD-10-CM

## 2018-05-01 MED ORDER — IOPAMIDOL (ISOVUE-300) INJECTION 61%
100.0000 mL | Freq: Once | INTRAVENOUS | Status: AC | PRN
  Administered 2018-05-01: 100 mL via INTRAVENOUS

## 2018-05-07 DIAGNOSIS — R3129 Other microscopic hematuria: Secondary | ICD-10-CM | POA: Diagnosis not present

## 2018-05-15 ENCOUNTER — Encounter: Payer: Medicare Other | Admitting: Vascular Surgery

## 2018-05-15 ENCOUNTER — Encounter (HOSPITAL_COMMUNITY): Payer: Medicare Other

## 2018-05-30 DIAGNOSIS — R319 Hematuria, unspecified: Secondary | ICD-10-CM | POA: Diagnosis not present

## 2018-06-19 ENCOUNTER — Ambulatory Visit (INDEPENDENT_AMBULATORY_CARE_PROVIDER_SITE_OTHER): Payer: Medicare Other

## 2018-06-19 DIAGNOSIS — Z23 Encounter for immunization: Secondary | ICD-10-CM | POA: Diagnosis not present

## 2018-06-24 DIAGNOSIS — R3121 Asymptomatic microscopic hematuria: Secondary | ICD-10-CM | POA: Diagnosis not present

## 2018-06-25 ENCOUNTER — Ambulatory Visit (INDEPENDENT_AMBULATORY_CARE_PROVIDER_SITE_OTHER): Payer: Medicare Other | Admitting: Cardiology

## 2018-06-25 ENCOUNTER — Encounter: Payer: Medicare Other | Admitting: Vascular Surgery

## 2018-06-25 ENCOUNTER — Encounter (HOSPITAL_COMMUNITY): Payer: Medicare Other

## 2018-06-25 VITALS — BP 94/63 | HR 74 | Ht 63.0 in | Wt 163.0 lb

## 2018-06-25 DIAGNOSIS — I10 Essential (primary) hypertension: Secondary | ICD-10-CM | POA: Diagnosis not present

## 2018-06-25 DIAGNOSIS — G4733 Obstructive sleep apnea (adult) (pediatric): Secondary | ICD-10-CM | POA: Diagnosis not present

## 2018-06-25 NOTE — Patient Instructions (Signed)
Medication Instructions:  Your physician recommends that you continue on your current medications as directed. Please refer to the Current Medication list given to you today.  If you need a refill on your cardiac medications before your next appointment, please call your pharmacy.   Lab work:  If you have labs (blood work) drawn today and your tests are completely normal, you will receive your results only by: [name] MyChart Message (if you have MyChart) OR . A paper copy in the mail If you have any lab test that is abnormal or we need to change your treatment, we will call you to review the results.  Follow-Up: At [facility], you and your health needs are our priority.  As part of our continuing mission to provide you with exceptional heart care, we have created designated Provider Care Teams.  These Care Teams include your primary Cardiologist (physician) and Advanced Practice Providers (APPs -  Physician Assistants and Nurse Practitioners) who all work together to provide you with the care you need, when you need it. You will need a follow up appointment in 1 years.  Please call our office 2 months in advance to schedule this appointment.  You may see Dr. Radford Pax or one of the following Advanced Practice Providers on your designated Care Team:   [place], PA-C Melina Copa, PA-C . Ermalinda Barrios, PA-C

## 2018-06-25 NOTE — Progress Notes (Signed)
Cardiology Office Note:    Date:  06/25/2018   ID:  Colleen White, DOB Feb 18, 1946, MRN [id number]  PCP:  Janith Lima, MD  Cardiologist:  No primary care provider on file.    Referring MD: Janith Lima, MD   Chief Complaint  Patient presents with  . Sleep Apnea  . Hypertension    History of Present Illness:    Colleen White is a 72 y.o. female with a hx of mild OSA with an AHI of 9.1/hr and is on CPAP at 7cm H2O.  She is doing well with her CPAP device .  She tolerates the mask and feels the pressure is adequate.  Since going on CPAP she feels rested in the am and has no significant daytime sleepiness if she has gotten enough sleep the night before.  She has occasional mouth dryness and nose dryness.  She does not think that he snores.    Past Medical History:  Diagnosis Date  . Allergic rhinitis, cause unspecified   . Alopecia   . Anxiety   . Colon polyps   . Diverticulosis of colon (without mention of hemorrhage)   . Fibrocystic breast disease   . GERD (gastroesophageal reflux disease)   . Headache(784.0)   . Hemorrhoids   . History of echocardiogram    Echo 3/17:  Vigorous LVF, EF 65-70%, normal wall motion, grade 1 diastolic, mildly elevated LVOT velocity of 2.2 m/s-likely related to vigorous LVF  . History of stress test    Myoview normal in 2/05. //  Stress echo (11/15) with no evidence for ischemia or infarction.  [name] HLD (hyperlipidemia)   . HTN (hypertension)   . Hypertension   . Mitral valve disorders(424.0)   . Non Hodgkin's lymphoma ([place])    Treated at [facility] with surgery and XRT.  Did not have chemotherapy.   . OSA (obstructive sleep apnea) 03/29/2016  . Osteopenia   . PAF (paroxysmal atrial fibrillation) ([place])    a. CHADS2-VASc=3 //  b. Xarelto started in 2017  . Pulmonary nodule    Presumed benign after observation.   [name] Unspecified glaucoma(365.9)     Past Surgical History:  Procedure Laterality Date  . ABDOMINAL HYSTERECTOMY  1996  . EYE  SURGERY  2009   Cataract  . 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 Cloyd Stagers, [facility]  . s/p ORIF prox phalanx  02/2007   right little finger----Dr Fredna Dow  . s/p TAH/BSO      Current Medications: Current Meds  Medication Sig  . brimonidine-timolol (COMBIGAN) 0.2-0.5 % ophthalmic solution Combigan 0.2 %-0.5 % eye drops  . Calcium Carbonate-Vitamin D (CALCIUM 500 + D PO) Take 1 tablet by mouth 2 (two) times daily.  . Cholecalciferol (VITAMIN D3) 1000 units CAPS Take by mouth.  . dicyclomine (BENTYL) 10 MG capsule Take 1 capsule (10 mg total) by mouth 3 (three) times daily before meals.  [name] diltiazem (CARDIZEM CD) 120 MG 24 hr capsule TAKE (1) CAPSULE DAILY.  [name] docusate sodium (COLACE) 100 MG capsule Take 100 mg by mouth at bedtime.  . gabapentin (NEURONTIN) 100 MG capsule Take 1 capsule (100 mg total) daily by mouth. (Patient taking differently: Take 400 mg by mouth as needed. )  . loratadine (CLARITIN) 10 MG tablet Take by mouth daily as needed.   [name] MINOXIDIL FOR WOMEN EX Apply topically. Applies to scalp for hairloss  . mirtazapine (REMERON) 15 MG tablet Take 15 mg by mouth as needed. Takes 1/2 tablet  at bedtime for itching of scalp   . Multiple Vitamins-Minerals (PRESERVISION AREDS 2 PO) Take 1 tablet by mouth 2 (two) times daily.   . pravastatin (PRAVACHOL) 40 MG tablet Take 1 tablet (40 mg total) by mouth daily.  . Probiotic Product (PROBIOTIC DAILY PO) Take 1 tablet by mouth daily.  [name] telmisartan-hydrochlorothiazide (MICARDIS HCT) 80-12.5 MG tablet Take 1 tablet by mouth daily.  . traMADol (ULTRAM) 50 MG tablet Take 1 tablet (50 mg total) by mouth every 6 (six) hours as needed.  . tretinoin (RETIN-A) 0.05 % cream Once daily   . vitamin C (ASCORBIC ACID) 500 MG tablet Take 500 mg by mouth daily.  Alveda Reasons 20 MG TABS tablet TAKE 1 TABLET DAILY WITH SUPPER.     Allergies:   Patient has no known allergies.   Social History   Socioeconomic History  . Marital  status: Married    Spouse name: Not on file  . Number of children: 0  . Years of education: Not on file  . Highest education level: Not on file  Occupational History  . Not on file  Social Needs  . Financial resource strain: Not on file  . Food insecurity:    Worry: Not on file    Inability: Not on file  . Transportation needs:    Medical: Not on file    Non-medical: Not on file  Tobacco Use  . Smoking status: Former Smoker    Last attempt to quit: 08/27/1974    Years since quitting: 43.8  . Smokeless tobacco: Never Used  Substance and Sexual Activity  . Alcohol use: No    Comment: occasionally  . Drug use: No  . Sexual activity: Yes  Lifestyle  . Physical activity:    Days per week: Not on file    Minutes per session: Not on file  . Stress: Not on file  Relationships  . Social connections:    Talks on phone: Not on file    Gets together: Not on file    Attends religious service: Not on file    Active member of club or organization: Not on file    Attends meetings of clubs or organizations: Not on file    Relationship status: Not on file  Other Topics Concern  . Not on file  Social History Narrative  . Not on file     Family History: The patient's family history includes Breast cancer in her mother; Cancer in her father and sister; Colon cancer in her maternal uncle; Diabetes in her father; Heart disease in her father; Hypertension in her mother; Prostate cancer in her father.  ROS:   Please see the history of present illness.    ROS  All other systems reviewed and negative.   EKGs/Labs/Other Studies Reviewed:    The following studies were reviewed today: PAP downlload  EKG:  EKG is not ordered today.    Recent Labs: 02/18/2018: Magnesium 2.0 04/24/2018: ALT 19; BUN 13; Creatinine, Ser 0.84; Hemoglobin 14.0; Platelets 231.0; Potassium 3.9; Sodium 140; TSH 2.01   Recent Lipid Panel    Component Value Date/Time   CHOL 157 02/18/2018 1139   TRIG 94.0  02/18/2018 1139   HDL 41.10 02/18/2018 1139   CHOLHDL 4 02/18/2018 1139   VLDL 18.8 02/18/2018 1139   LDLCALC 97 02/18/2018 1139   LDLDIRECT 142.5 09/30/2012 1110    Physical Exam:    VS:  BP 94/63   Pulse 74   Ht 5' 3"  (1.6 m)  Wt 163 lb (73.9 kg)   BMI 28.87 kg/m     Wt Readings from Last 3 Encounters:  06/25/18 163 lb (73.9 kg)  04/24/18 163 lb 4 oz (74 kg)  02/18/18 163 lb (73.9 kg)     GEN:  Well nourished, well developed in no acute distress HEENT: Normal NECK: No JVD; No carotid bruits LYMPHATICS: No lymphadenopathy CARDIAC: RRR, no murmurs, rubs, gallops RESPIRATORY:  Clear to auscultation without rales, wheezing or rhonchi  ABDOMEN: Soft, non-tender, non-distended MUSCULOSKELETAL:  No edema; No deformity  SKIN: Warm and dry NEUROLOGIC:  Alert and oriented x 3 PSYCHIATRIC:  Normal affect   ASSESSMENT:    1. OSA (obstructive sleep apnea)   2. Essential hypertension    PLAN:    In order of problems listed above:  1. OSA - the patient is tolerating PAP therapy well without any problems. The PAP download was reviewed today and showed an AHI of 2.4/hr on 7 cm H2O with 87% compliance in using more than 4 hours nightly.  The patient has been using and benefiting from PAP use and will continue to benefit from therapy. I encouraged her to try to adjust her humidity dial to help with nasal dryness.  She does not want to try a chin strap at this time for mouth dryness.  2.  HTN - BP is controlled on exam today.  She will continue on Cardizem CD 141m daily and Micardis HCT 80-12.552mdaily.     Medication Adjustments/Labs and Tests Ordered: Current medicines are reviewed at length with the patient today.  Concerns regarding medicines are outlined above.  No orders of the defined types were placed in this encounter.  No orders of the defined types were placed in this encounter.   Signed, TrFransico HimMD  06/25/2018 9:01 AM    [place]

## 2018-06-26 DIAGNOSIS — L658 Other specified nonscarring hair loss: Secondary | ICD-10-CM | POA: Diagnosis not present

## 2018-06-26 DIAGNOSIS — R208 Other disturbances of skin sensation: Secondary | ICD-10-CM | POA: Diagnosis not present

## 2018-07-07 DIAGNOSIS — H401131 Primary open-angle glaucoma, bilateral, mild stage: Secondary | ICD-10-CM | POA: Diagnosis not present

## 2018-07-07 DIAGNOSIS — H04123 Dry eye syndrome of bilateral lacrimal glands: Secondary | ICD-10-CM | POA: Diagnosis not present

## 2018-07-07 DIAGNOSIS — H02403 Unspecified ptosis of bilateral eyelids: Secondary | ICD-10-CM | POA: Diagnosis not present

## 2018-07-08 DIAGNOSIS — C8290 Follicular lymphoma, unspecified, unspecified site: Secondary | ICD-10-CM | POA: Diagnosis not present

## 2018-07-10 ENCOUNTER — Ambulatory Visit (HOSPITAL_COMMUNITY)
Admission: RE | Admit: 2018-07-10 | Discharge: 2018-07-10 | Disposition: A | Discharge status: Home / Self Care | Payer: Medicare Other | Source: Ambulatory Visit | Attending: Cardiology | Admitting: Cardiology

## 2018-07-10 VITALS — BP 140/60 | HR 53 | Wt 163.6 lb

## 2018-07-10 DIAGNOSIS — E7800 Pure hypercholesterolemia, unspecified: Secondary | ICD-10-CM

## 2018-07-10 DIAGNOSIS — I10 Essential (primary) hypertension: Secondary | ICD-10-CM

## 2018-07-10 DIAGNOSIS — R918 Other nonspecific abnormal finding of lung field: Secondary | ICD-10-CM | POA: Insufficient documentation

## 2018-07-10 DIAGNOSIS — K219 Gastro-esophageal reflux disease without esophagitis: Secondary | ICD-10-CM | POA: Diagnosis not present

## 2018-07-10 DIAGNOSIS — Z79899 Other long term (current) drug therapy: Secondary | ICD-10-CM | POA: Diagnosis not present

## 2018-07-10 DIAGNOSIS — Z8572 Personal history of non-Hodgkin lymphomas: Secondary | ICD-10-CM | POA: Insufficient documentation

## 2018-07-10 DIAGNOSIS — I480 Paroxysmal atrial fibrillation: Secondary | ICD-10-CM | POA: Diagnosis not present

## 2018-07-10 DIAGNOSIS — G4733 Obstructive sleep apnea (adult) (pediatric): Secondary | ICD-10-CM | POA: Insufficient documentation

## 2018-07-10 DIAGNOSIS — Z7901 Long term (current) use of anticoagulants: Secondary | ICD-10-CM | POA: Insufficient documentation

## 2018-07-10 DIAGNOSIS — K5790 Diverticulosis of intestine, part unspecified, without perforation or abscess without bleeding: Secondary | ICD-10-CM | POA: Insufficient documentation

## 2018-07-10 DIAGNOSIS — Z8249 Family history of ischemic heart disease and other diseases of the circulatory system: Secondary | ICD-10-CM | POA: Insufficient documentation

## 2018-07-10 DIAGNOSIS — E785 Hyperlipidemia, unspecified: Secondary | ICD-10-CM | POA: Insufficient documentation

## 2018-07-10 LAB — BASIC METABOLIC PANEL
Anion gap: 6 (ref 5–15)
BUN: 10 mg/dL (ref 8–23)
CO2: 29 mmol/L (ref 22–32)
Calcium: 9.4 mg/dL (ref 8.9–10.3)
Chloride: 103 mmol/L (ref 98–111)
Creatinine, Ser: 0.75 mg/dL (ref 0.44–1.00)
GFR calc Af Amer: >60 mL/min (ref >60)
GFR calc non Af Amer: >60 mL/min (ref >60)
Glucose, Bld: 100 mg/dL — ABNORMAL HIGH (ref 70–99)
Potassium: 3.7 mmol/L (ref 3.5–5.1)
Sodium: 138 mmol/L (ref 135–145)

## 2018-07-10 LAB — CBC
HCT: 40.4 % (ref 36.0–46.0)
Hemoglobin: 13.3 g/dL (ref 12.0–15.0)
MCH: 30.9 pg (ref 26.0–34.0)
MCHC: 32.9 g/dL (ref 30.0–36.0)
MCV: 94 fL (ref 80.0–100.0)
Platelets: 200 10*3/uL (ref 150–400)
RBC: 4.3 MIL/uL (ref 3.87–5.11)
RDW: 12 % (ref 11.5–15.5)
WBC: 4.4 10*3/uL (ref 4.0–10.5)
nRBC: 0 % (ref 0.0–0.2)

## 2018-07-10 NOTE — Patient Instructions (Signed)
Routine lab work today. Will notify you of abnormal results  Follow up with Dr.McLean in 1 year Please call our office at [phone] in October 2020 to schedule your November 2020 appointment.

## 2018-07-10 NOTE — Progress Notes (Signed)
Patient ID: Colleen White, female   DOB: February 28, 1946, 72 y.o.   MRN: [id number] PCP: Dr. Ronnald Ramp Cardiology: Dr. Aundra Dubin  72 y.o. with history of HTN, hyperlipidemia, paroxysmal atrial fibrillation and prior non-Hodgkins lymphoma presents for followup of atrial fibrillation.  She called the office in 1/17 to report increased episodes of palpitations.  She had had rare tachypalpitations for years, but in 1/17 she had 2 episodes of tachypalpitations close together.  I had her wear an event monitor.  By monitor, her tachypalpitations correlated with atrial fibrillation with RVR.  She was started on Xarelto and Toprol XL.  Toprol XL caused swelling, so it was stopped and diltiazem CD was begun.  Echo in 3/17 showed EF 65-70% with mildly elevated LVOT velocity due to vigorous contraction.  She has been doing well recently.  Using CPAP.  Atrial fibrillation burden seems to have gone down considerably.  SBP runs in 120s-130s at home. No chest pain or exertional dyspnea.  No palpitations.  No lightheadedness.  She tries to stay active, walking etc. No BRBPR/melena.   ECG: NSR, left atrial enlargement (personally reviewed)  Labs (4/15): K 3.7, creatinine 0.6, LDL 111, HDL 46 Labs (4/16): LDL 89 Labs (3/17): K 3.8, creatinine 0.7, HCT 37.3 Labs (4/17): LDL 93, HDL 46 Labs (8/17): hgb 13.1, K 3.4, creatinine 0.7 Labs (4/18): LDL 99, hgb 13.7 Labs (10/18): K 3.7, creatinine 0.75 Labs (6/19): LDL 97 Labs (8/19): K 3.9, creatinine 0.84  PMH: 1. Echo (11/15): EF 60-65%, trivial MR. Echo (3/17) with EF 65-70%, mildly elevated LVOT velocity due to vigorous contraction. 2. Chest pain: Myoview normal in 2/05. Stress echo (11/15) with no evidence for ischemia or infarction.  3. Suspected glaucoma 4. HTN 5. Hyperlipidemia 6. GERD 7. Diverticulosis 8. TAH/BSO 9. Non-Hodgkins lymphoma: In remission.  Treated at [facility] with surgery and XRT.  Did not have chemotherapy.  10. Pulmonary nodules: Presumed benign after  observation.  11. Atrial fibrillation: Paroxysmal.  12. OSA: Uses CPAP.   SH: Married, cares for elderly mother, nonsmoker.  Lives in [place].  FH: Mother with atrial fibrillation and CAD.   ROS: All systems reviewed and negative except as per HPI.   Current Outpatient Medications  Medication Sig Dispense Refill  . brimonidine-timolol (COMBIGAN) 0.2-0.5 % ophthalmic solution Combigan 0.2 %-0.5 % eye drops    . Calcium Carbonate-Vitamin D (CALCIUM 500 + D PO) Take 1 tablet by mouth 2 (two) times daily.    . Cholecalciferol (VITAMIN D3) 1000 units CAPS Take by mouth.    . Coenzyme Q10 (CO Q 10) 100 MG CAPS Take 1 tablet by mouth daily.    [name] dicyclomine (BENTYL) 10 MG capsule Take 1 capsule (10 mg total) by mouth 3 (three) times daily before meals. (Patient taking differently: Take 10 mg by mouth 3 (three) times daily before meals. ) 90 capsule 11  . diltiazem (CARDIZEM CD) 120 MG 24 hr capsule TAKE (1) CAPSULE DAILY. 30 capsule 11  . docusate sodium (COLACE) 100 MG capsule Take 100 mg by mouth at bedtime.    [name] esomeprazole (NEXIUM) 20 MG packet Take 20 mg by mouth daily before breakfast.    . gabapentin (NEURONTIN) 100 MG capsule Take 1 capsule (100 mg total) daily by mouth. (Patient taking differently: Take 400 mg by mouth as needed. ) 90 capsule 0  . loratadine (CLARITIN) 10 MG tablet Take by mouth daily as needed.     [name] MINOXIDIL FOR WOMEN EX Apply topically. Applies to scalp for hairloss    .  mirtazapine (REMERON) 15 MG tablet Take 15 mg by mouth as needed. Takes 1/2 tablet at bedtime for itching of scalp     . Multiple Vitamins-Minerals (PRESERVISION AREDS 2 PO) Take 1 tablet by mouth 2 (two) times daily.     . pravastatin (PRAVACHOL) 40 MG tablet Take 1 tablet (40 mg total) by mouth daily. 90 tablet 1  . Probiotic Product (PROBIOTIC DAILY PO) Take 1 tablet by mouth daily.    [name] telmisartan-hydrochlorothiazide (MICARDIS HCT) 80-12.5 MG tablet Take 1 tablet by mouth daily. 90 tablet 1   . traMADol (ULTRAM) 50 MG tablet Take 1 tablet (50 mg total) by mouth every 6 (six) hours as needed. 65 tablet 3  . tretinoin (RETIN-A) 0.05 % cream Once daily     . vitamin C (ASCORBIC ACID) 500 MG tablet Take 500 mg by mouth daily.    Alveda Reasons 20 MG TABS tablet TAKE 1 TABLET DAILY WITH SUPPER. 30 tablet 11   No current facility-administered medications for this encounter.     BP 140/60   Pulse (!) 53   Wt 74.2 kg (163 lb 9.6 oz)   SpO2 96%   BMI 28.98 kg/m  General: NAD Neck: No JVD, no thyromegaly or thyroid nodule.  Lungs: Clear to auscultation bilaterally with normal respiratory effort. CV: Nondisplaced PMI.  Heart regular S1/S2, no S3/S4, no murmur.  No peripheral edema.  No carotid bruit.  Normal pedal pulses.  Abdomen: Soft, nontender, no hepatosplenomegaly, no distention.  Skin: Intact without lesions or rashes.  Neurologic: Alert and oriented x 3.  Psych: Normal affect. Extremities: No clubbing or cyanosis.  HEENT: Normal.   Assessment/Plan: 1. HTN: BP seems relatively well-controlled, no changes.  2. Hyperlipidemia: Lipids ok in 6/19.  3. Atrial fibrillation: Paroxysmal.  First noted in 1/17.  No symptomatic recurrences recently.   CHADSVASC = 3.  Best route for prevention will likely be to continue to control OSA and HTN.  - Continue diltiazem CD 120 daily.  - Continue Xarelto 20 daily. CBC and BMET today.  - She is now using CPAP. 4. OSA: Using CPAP.   Followup in 1 year.   [name] 07/10/2018

## 2018-07-28 DIAGNOSIS — R928 Other abnormal and inconclusive findings on diagnostic imaging of breast: Secondary | ICD-10-CM | POA: Diagnosis not present

## 2018-07-28 DIAGNOSIS — Z1239 Encounter for other screening for malignant neoplasm of breast: Secondary | ICD-10-CM | POA: Diagnosis not present

## 2018-07-28 DIAGNOSIS — Z9189 Other specified personal risk factors, not elsewhere classified: Secondary | ICD-10-CM | POA: Diagnosis not present

## 2018-07-31 DIAGNOSIS — N6012 Diffuse cystic mastopathy of left breast: Secondary | ICD-10-CM | POA: Diagnosis not present

## 2018-07-31 DIAGNOSIS — N6011 Diffuse cystic mastopathy of right breast: Secondary | ICD-10-CM | POA: Diagnosis not present

## 2018-07-31 DIAGNOSIS — Z6823 Body mass index (BMI) 23.0-23.9, adult: Secondary | ICD-10-CM | POA: Diagnosis not present

## 2018-09-04 DIAGNOSIS — I781 Nevus, non-neoplastic: Secondary | ICD-10-CM | POA: Diagnosis not present

## 2018-09-04 DIAGNOSIS — L298 Other pruritus: Secondary | ICD-10-CM | POA: Diagnosis not present

## 2018-09-04 DIAGNOSIS — L821 Other seborrheic keratosis: Secondary | ICD-10-CM | POA: Diagnosis not present

## 2018-09-04 DIAGNOSIS — D229 Melanocytic nevi, unspecified: Secondary | ICD-10-CM | POA: Diagnosis not present

## 2018-09-04 DIAGNOSIS — L658 Other specified nonscarring hair loss: Secondary | ICD-10-CM | POA: Diagnosis not present

## 2018-09-04 DIAGNOSIS — Z1283 Encounter for screening for malignant neoplasm of skin: Secondary | ICD-10-CM | POA: Diagnosis not present

## 2018-09-04 DIAGNOSIS — D1801 Hemangioma of skin and subcutaneous tissue: Secondary | ICD-10-CM | POA: Diagnosis not present

## 2018-09-04 DIAGNOSIS — Z85828 Personal history of other malignant neoplasm of skin: Secondary | ICD-10-CM | POA: Diagnosis not present

## 2018-09-04 DIAGNOSIS — L918 Other hypertrophic disorders of the skin: Secondary | ICD-10-CM | POA: Diagnosis not present

## 2018-09-04 DIAGNOSIS — L299 Pruritus, unspecified: Secondary | ICD-10-CM | POA: Diagnosis not present

## 2018-09-10 ENCOUNTER — Encounter: Payer: Self-pay | Admitting: Internal Medicine

## 2018-09-10 ENCOUNTER — Other Ambulatory Visit (INDEPENDENT_AMBULATORY_CARE_PROVIDER_SITE_OTHER): Payer: Medicare Other

## 2018-09-10 ENCOUNTER — Ambulatory Visit (INDEPENDENT_AMBULATORY_CARE_PROVIDER_SITE_OTHER): Payer: Medicare Other | Admitting: Internal Medicine

## 2018-09-10 VITALS — BP 128/80 | HR 68 | Temp 97.8°F | Resp 16 | Ht 63.0 in | Wt 165.0 lb

## 2018-09-10 DIAGNOSIS — E538 Deficiency of other specified B group vitamins: Secondary | ICD-10-CM

## 2018-09-10 DIAGNOSIS — I10 Essential (primary) hypertension: Secondary | ICD-10-CM

## 2018-09-10 DIAGNOSIS — I480 Paroxysmal atrial fibrillation: Secondary | ICD-10-CM

## 2018-09-10 DIAGNOSIS — Z1211 Encounter for screening for malignant neoplasm of colon: Secondary | ICD-10-CM

## 2018-09-10 DIAGNOSIS — K7581 Nonalcoholic steatohepatitis (NASH): Secondary | ICD-10-CM | POA: Diagnosis not present

## 2018-09-10 DIAGNOSIS — R739 Hyperglycemia, unspecified: Secondary | ICD-10-CM | POA: Diagnosis not present

## 2018-09-10 LAB — CBC WITH DIFFERENTIAL/PLATELET
Basophils Absolute: 0 10*3/uL (ref 0.0–0.1)
Basophils Relative: 0.6 % (ref 0.0–3.0)
Eosinophils Absolute: 0 10*3/uL (ref 0.0–0.7)
Eosinophils Relative: 0.7 % (ref 0.0–5.0)
HCT: 39.2 % (ref 36.0–46.0)
Hemoglobin: 13.3 g/dL (ref 12.0–15.0)
Lymphocytes Relative: 23.9 % (ref 12.0–46.0)
Lymphs Abs: 1.5 10*3/uL (ref 0.7–4.0)
MCHC: 34.1 g/dL (ref 30.0–36.0)
MCV: 91.6 fl (ref 78.0–100.0)
Monocytes Absolute: 0.5 10*3/uL (ref 0.1–1.0)
Monocytes Relative: 7.6 % (ref 3.0–12.0)
Neutro Abs: 4.2 10*3/uL (ref 1.4–7.7)
Neutrophils Relative %: 67.2 % (ref 43.0–77.0)
Platelets: 211 10*3/uL (ref 150.0–400.0)
RBC: 4.27 Mil/uL (ref 3.87–5.11)
RDW: 12.7 % (ref 11.5–15.5)
WBC: 6.2 10*3/uL (ref 4.0–10.5)

## 2018-09-10 LAB — BASIC METABOLIC PANEL
BUN: 12 mg/dL (ref 6–23)
CO2: 30 mEq/L (ref 19–32)
Calcium: 9.5 mg/dL (ref 8.4–10.5)
Chloride: 104 mEq/L (ref 96–112)
Creatinine, Ser: 0.8 mg/dL (ref 0.40–1.20)
GFR: 74.55 mL/min (ref >60.00)
Glucose, Bld: 93 mg/dL (ref 70–99)
Potassium: 3.9 mEq/L (ref 3.5–5.1)
Sodium: 140 mEq/L (ref 135–145)

## 2018-09-10 LAB — HEPATIC FUNCTION PANEL
ALT: 18 U/L (ref 0–35)
AST: 18 U/L (ref 0–37)
Albumin: 4.1 g/dL (ref 3.5–5.2)
Alkaline Phosphatase: 83 U/L (ref 39–117)
Bilirubin, Direct: 0.1 mg/dL (ref 0.0–0.3)
Total Bilirubin: 0.6 mg/dL (ref 0.2–1.2)
Total Protein: 6.8 g/dL (ref 6.0–8.3)

## 2018-09-10 LAB — FOLATE: Folate: >24 ng/mL (ref >5.9)

## 2018-09-10 LAB — HEMOGLOBIN A1C: Hgb A1c MFr Bld: 5.8 % (ref 4.6–6.5)

## 2018-09-10 LAB — VITAMIN B12: Vitamin B-12: 492 pg/mL (ref 211–911)

## 2018-09-10 NOTE — Progress Notes (Signed)
Subjective:  Patient ID: Colleen White, female    DOB: Jul 23, 1946  Age: 74 y.o. MRN: [id number]  CC: Hypertension   HPI [name] presents for f/up - She is concerned about her liver today.  Over the last few years she has seen multiple specialists including oncology, GI, and GU.  She has had multiple CT scans and they consistently show that she has some fatty accumulation in her liver.  She is asymptomatic with this but she does report that her diet is very poor in nutritious elements and high in carbohydrates.  She recently had a work-up for hematuria that included a CT scan and cystoscopy which was all unremarkable.  Outpatient Medications Prior to Visit  Medication Sig Dispense Refill  . brimonidine-timolol (COMBIGAN) 0.2-0.5 % ophthalmic solution Combigan 0.2 %-0.5 % eye drops    . Calcium Carbonate-Vitamin D (CALCIUM 500 + D PO) Take 1 tablet by mouth 2 (two) times daily.    . Cholecalciferol (VITAMIN D3) 1000 units CAPS Take by mouth.    . Coenzyme Q10 (CO Q 10) 100 MG CAPS Take 1 tablet by mouth daily.    [name] dicyclomine (BENTYL) 10 MG capsule Take 1 capsule (10 mg total) by mouth 3 (three) times daily before meals. (Patient taking differently: Take 10 mg by mouth 3 (three) times daily before meals. ) 90 capsule 11  . diltiazem (CARDIZEM CD) 120 MG 24 hr capsule TAKE (1) CAPSULE DAILY. 30 capsule 11  . docusate sodium (COLACE) 100 MG capsule Take 100 mg by mouth at bedtime.    [name] esomeprazole (NEXIUM) 20 MG packet Take 20 mg by mouth daily before breakfast.    . gabapentin (NEURONTIN) 100 MG capsule Take 1 capsule (100 mg total) daily by mouth. (Patient taking differently: Take 400 mg by mouth as needed. ) 90 capsule 0  . loratadine (CLARITIN) 10 MG tablet Take by mouth daily as needed.     [name] MINOXIDIL FOR WOMEN EX Apply topically. Applies to scalp for hairloss    . mirtazapine (REMERON) 15 MG tablet Take 15 mg by mouth as needed. Takes 1/2 tablet at bedtime for itching of  scalp     . Multiple Vitamins-Minerals (PRESERVISION AREDS 2 PO) Take 1 tablet by mouth 2 (two) times daily.     . pravastatin (PRAVACHOL) 40 MG tablet Take 1 tablet (40 mg total) by mouth daily. 90 tablet 1  . Probiotic Product (PROBIOTIC DAILY PO) Take 1 tablet by mouth daily.    [name] telmisartan-hydrochlorothiazide (MICARDIS HCT) 80-12.5 MG tablet Take 1 tablet by mouth daily. 90 tablet 1  . traMADol (ULTRAM) 50 MG tablet Take 1 tablet (50 mg total) by mouth every 6 (six) hours as needed. 65 tablet 3  . tretinoin (RETIN-A) 0.05 % cream Once daily     . vitamin C (ASCORBIC ACID) 500 MG tablet Take 500 mg by mouth daily.    Alveda Reasons 20 MG TABS tablet TAKE 1 TABLET DAILY WITH SUPPER. 30 tablet 11   No facility-administered medications prior to visit.     ROS Review of Systems  Constitutional: Negative.  Negative for appetite change, diaphoresis, fatigue and unexpected weight change.  HENT: Negative.  Negative for trouble swallowing.   Eyes: Negative for visual disturbance.  Respiratory: Negative for cough, chest tightness and shortness of breath.   Cardiovascular: Negative for chest pain, palpitations and leg swelling.  Gastrointestinal: Negative for abdominal pain, constipation, diarrhea and nausea.  Endocrine: Negative for cold intolerance and heat intolerance.  Genitourinary: Negative.  Negative for difficulty urinating.  Musculoskeletal: Negative.  Negative for arthralgias and myalgias.  Skin: Negative.   Neurological: Negative.  Negative for dizziness, weakness, light-headedness and numbness.  Hematological: Negative for adenopathy. Does not bruise/bleed easily.  Psychiatric/Behavioral: Positive for sleep disturbance. Negative for decreased concentration, dysphoric mood and suicidal ideas. The patient is nervous/anxious.     Objective:  BP 128/80 (BP Location: Left Arm, Patient Position: Sitting, Cuff Size: Normal)   Pulse 68   Temp 97.8 F (36.6 C) (Oral)   Resp 16   Ht 5\' 3"   (1.6 m)   Wt 165 lb (74.8 kg)   SpO2 95%   BMI 29.23 kg/m   BP Readings from Last 3 Encounters:  09/10/18 128/80  07/10/18 140/60  06/25/18 94/63    Wt Readings from Last 3 Encounters:  09/10/18 165 lb (74.8 kg)  07/10/18 163 lb 9.6 oz (74.2 kg)  06/25/18 163 lb (73.9 kg)    Physical Exam Vitals signs reviewed.  Constitutional:      Appearance: She is obese.  HENT:     Nose: Nose normal. No congestion or rhinorrhea.     Mouth/Throat:     Pharynx: Oropharynx is clear. No posterior oropharyngeal erythema.  Eyes:     General: No scleral icterus. Neck:     Musculoskeletal: Normal range of motion and neck supple.  Cardiovascular:     Rate and Rhythm: Normal rate. Rhythm irregularly irregular.     Heart sounds: No murmur. No friction rub. No gallop.   Pulmonary:     Breath sounds: No stridor. No wheezing, rhonchi or rales.  Abdominal:     Palpations: There is no hepatomegaly, splenomegaly or mass.     Tenderness: There is no abdominal tenderness.     Hernia: No hernia is present.  Musculoskeletal: Normal range of motion.        General: No swelling.     Right lower leg: No edema.     Left lower leg: No edema.  Skin:    General: Skin is warm.     Findings: No bruising, erythema or rash.  Neurological:     General: No focal deficit present.     Mental Status: She is oriented to person, place, and time. Mental status is at baseline.  Psychiatric:        Attention and Perception: Attention normal.        Mood and Affect: Mood is anxious.        Speech: Speech normal.        Behavior: Behavior normal. Behavior is cooperative.        Thought Content: Thought content normal. Thought content does not include homicidal or suicidal ideation.        Cognition and Memory: Cognition normal.        Judgment: Judgment normal.     Lab Results  Component Value Date   WBC 6.2 09/10/2018   HGB 13.3 09/10/2018   HCT 39.2 09/10/2018   PLT 211.0 09/10/2018   GLUCOSE 93  09/10/2018   CHOL 157 02/18/2018   TRIG 94.0 02/18/2018   HDL 41.10 02/18/2018   LDLDIRECT 142.5 09/30/2012   LDLCALC 97 02/18/2018   ALT 18 09/10/2018   AST 18 09/10/2018   NA 140 09/10/2018   K 3.9 09/10/2018   CL 104 09/10/2018   CREATININE 0.80 09/10/2018   BUN 12 09/10/2018   CO2 30 09/10/2018   TSH 2.01 04/24/2018   HGBA1C 5.8 09/10/2018  No results found.  Assessment & Plan:   Konica was seen today for hypertension.  Diagnoses and all orders for this visit:  Essential hypertension- Her blood pressure is well controlled. -     CBC with Differential/Platelet; Future -     Basic metabolic panel; Future  B12 deficiency due to diet- Her H&H, B12, and folate levels are normal now. -     CBC with Differential/Platelet; Future -     Vitamin B12; Future -     Folate; Future  Paroxysmal atrial fibrillation ([place])- She is maintaining good rate control.  Will continue anticoagulation with Xarelto.  NASH (nonalcoholic steatohepatitis)- Her LFTs are normal so I do not think treating this with pioglitazone is indicated.  I did encourage her to improve her lifestyle modifications. I asked her to decrease her intake of carbohydrates and increase her intake of lean proteins.  She will also increase her exercise regimen and try to lose weight. -     Hepatic function panel; Future  Hyperglycemia- Her A1c is at 5.8%.  Medical therapy is not indicated. -     Hemoglobin A1c; Future   I am having [name] maintain her tretinoin, Multiple Vitamins-Minerals (PRESERVISION AREDS 2 PO), vitamin C, Probiotic Product (PROBIOTIC DAILY PO), Calcium Carbonate-Vitamin D (CALCIUM 500 + D PO), gabapentin, diltiazem, XARELTO, brimonidine-timolol, loratadine, MINOXIDIL FOR WOMEN EX, Vitamin D3, traMADol, telmisartan-hydrochlorothiazide, pravastatin, mirtazapine, docusate sodium, dicyclomine, esomeprazole, and Co Q 10.  No orders of the defined types were placed in this  encounter.    Follow-up: Return in about 6 months (around 03/11/2019).  Scarlette Calico, MD

## 2018-09-10 NOTE — Patient Instructions (Signed)
Fatty Liver Disease  Fatty liver disease occurs when too much fat has built up in your liver cells. Fatty liver disease is also called hepatic steatosis or steatohepatitis. The liver removes harmful substances from your bloodstream and produces fluids that your body needs. It also helps your body use and store energy from the food you eat. In many cases, fatty liver disease does not cause symptoms or problems. It is often diagnosed when tests are being done for other reasons. However, over time, fatty liver can cause inflammation that may lead to more serious liver problems, such as scarring of the liver (cirrhosis) and liver failure. Fatty liver is associated with insulin resistance, increased body fat, high blood pressure (hypertension), and high cholesterol. These are features of metabolic syndrome and increase your risk for stroke, diabetes, and heart disease. What are the causes? This condition may be caused by:  Drinking too much alcohol.  Poor nutrition.  Obesity.  Cushing's syndrome.  Diabetes.  High cholesterol.  Certain drugs.  Poisons.  Some viral infections.  Pregnancy. What increases the risk? You are more likely to develop this condition if you:  Abuse alcohol.  Are overweight.  Have diabetes.  Have hepatitis.  Have a high triglyceride level.  Are pregnant. What are the signs or symptoms? Fatty liver disease often does not cause symptoms. If symptoms do develop, they can include:  Fatigue.  Weakness.  Weight loss.  Confusion.  Abdominal pain.  Nausea and vomiting.  Yellowing of your skin and the white parts of your eyes (jaundice).  Itchy skin. How is this diagnosed? This condition may be diagnosed by:  A physical exam and medical history.  Blood tests.  Imaging tests, such as an ultrasound, CT scan, or MRI.  A liver biopsy. A small sample of liver tissue is removed using a needle. The sample is then looked at under a microscope. How  is this treated? Fatty liver disease is often caused by other health conditions. Treatment for fatty liver may involve medicines and lifestyle changes to manage conditions such as:  Alcoholism.  High cholesterol.  Diabetes.  Being overweight or obese. Follow these instructions at home:   Do not drink alcohol. If you have trouble quitting, ask your health care provider how to safely quit with the help of medicine or a supervised program. This is important to keep your condition from getting worse.  Eat a healthy diet as told by your health care provider. Ask your health care provider about working with a diet and nutrition specialist (dietitian) to develop an eating plan.  Exercise regularly. This can help you lose weight and control your cholesterol and diabetes. Talk to your health care provider about an exercise plan and which activities are best for you.  Take over-the-counter and prescription medicines only as told by your health care provider.  Keep all follow-up visits as told by your health care provider. This is important. Contact a health care provider if: You have trouble controlling your:  Blood sugar. This is especially important if you have diabetes.  Cholesterol.  Drinking of alcohol. Get help right away if:  You have abdominal pain.  You have jaundice.  You have nausea and vomiting.  You vomit blood or material that looks like coffee grounds.  You have stools that are black, tar-like, or bloody. Summary  Fatty liver disease develops when too much fat builds up in the cells of your liver.  Fatty liver disease often causes no symptoms or problems. However, over   time, fatty liver can cause inflammation that may lead to more serious liver problems, such as scarring of the liver (cirrhosis).  You are more likely to develop this condition if you abuse alcohol, are pregnant, are overweight, have diabetes, have hepatitis, or have high triglyceride  levels.  Contact your health care provider if you have trouble controlling your weight, blood sugar, cholesterol, or drinking of alcohol. This information is not intended to replace advice given to you by your health care provider. Make sure you discuss any questions you have with your health care provider. Document Released: 09/28/2005 Document Revised: 05/22/2017 Document Reviewed: 05/22/2017 Elsevier Interactive Patient Education  2019 [employer]  

## 2018-09-22 DIAGNOSIS — R8290 Unspecified abnormal findings in urine: Secondary | ICD-10-CM | POA: Diagnosis not present

## 2018-09-22 DIAGNOSIS — R300 Dysuria: Secondary | ICD-10-CM | POA: Diagnosis not present

## 2018-09-24 ENCOUNTER — Other Ambulatory Visit: Payer: Self-pay | Admitting: Internal Medicine

## 2018-09-24 ENCOUNTER — Ambulatory Visit (INDEPENDENT_AMBULATORY_CARE_PROVIDER_SITE_OTHER): Payer: Medicare Other | Admitting: *Deleted

## 2018-09-24 VITALS — BP 106/62 | HR 58 | Resp 17 | Ht 63.0 in | Wt 162.0 lb

## 2018-09-24 DIAGNOSIS — M159 Polyosteoarthritis, unspecified: Secondary | ICD-10-CM

## 2018-09-24 DIAGNOSIS — M150 Primary generalized (osteo)arthritis: Principal | ICD-10-CM

## 2018-09-24 DIAGNOSIS — Z0000 Encounter for general adult medical examination without abnormal findings: Secondary | ICD-10-CM

## 2018-09-24 DIAGNOSIS — M8949 Other hypertrophic osteoarthropathy, multiple sites: Secondary | ICD-10-CM

## 2018-09-24 NOTE — Patient Instructions (Addendum)
Continue doing brain stimulating activities (puzzles, reading, adult coloring books, staying active) to keep memory sharp.   Continue to eat heart healthy diet (full of fruits, vegetables, whole grains, lean protein, water--limit salt, fat, and sugar intake) and increase physical activity as tolerated.   [name] , Thank you for taking time to come for your Medicare Wellness Visit. I appreciate your ongoing commitment to your health goals. Please review the following plan we discussed and let me know if I can assist you in the future.   These are the goals we discussed: Goals    . Patient Stated     Eat healthy and exercise, drink more water.       This is a list of the screening recommended for you and due dates:  Health Maintenance  Topic Date Due  . Mammogram  12/27/2019  . Colon Cancer Screening  09/06/2020  . Tetanus Vaccine  09/30/2022  . Flu Shot  Completed  . DEXA scan (bone density measurement)  Completed  .  Hepatitis C: One time screening is recommended by Center for Disease Control  (CDC) for  adults born from [birth year] through 1966.   Completed  . Pneumonia vaccines  Completed   Health Maintenance, Female Adopting a healthy lifestyle and getting preventive care can go a long way to promote health and wellness. Talk with your health care provider about what schedule of regular examinations is right for you. This is a good chance for you to check in with your provider about disease prevention and staying healthy. In between checkups, there are plenty of things you can do on your own. Experts have done a lot of research about which lifestyle changes and preventive measures are most likely to keep you healthy. Ask your health care provider for more information. Weight and diet Eat a healthy diet  Be sure to include plenty of vegetables, fruits, low-fat dairy products, and lean protein.  Do not eat a lot of foods high in solid fats, added sugars, or salt.  Get regular  exercise. This is one of the most important things you can do for your health. ? Most adults should exercise for at least 150 minutes each week. The exercise should increase your heart rate and make you sweat (moderate-intensity exercise). ? Most adults should also do strengthening exercises at least twice a week. This is in addition to the moderate-intensity exercise. Maintain a healthy weight  Body mass index (BMI) is a measurement that can be used to identify possible weight problems. It estimates body fat based on height and weight. Your health care provider can help determine your BMI and help you achieve or maintain a healthy weight.  For females 12 years of age and older: ? A BMI below 18.5 is considered underweight. ? A BMI of 18.5 to 24.9 is normal. ? A BMI of 25 to 29.9 is considered overweight. ? A BMI of 30 and above is considered obese. Watch levels of cholesterol and blood lipids  You should start having your blood tested for lipids and cholesterol at 74 years of age, then have this test every 5 years.  You may need to have your cholesterol levels checked more often if: ? Your lipid or cholesterol levels are high. ? You are older than 74 years of age. ? You are at high risk for heart disease. Cancer screening Lung Cancer  Lung cancer screening is recommended for adults 53-53 years old who are at high risk for lung cancer because  of a history of smoking.  A yearly low-dose CT scan of the lungs is recommended for people who: ? Currently smoke. ? Have quit within the past 15 years. ? Have at least a 30-pack-year history of smoking. A pack year is smoking an average of one pack of cigarettes a day for 1 year.  Yearly screening should continue until it has been 15 years since you quit.  Yearly screening should stop if you develop a health problem that would prevent you from having lung cancer treatment. Breast Cancer  Practice breast self-awareness. This means  understanding how your breasts normally appear and feel.  It also means doing regular breast self-exams. Let your health care provider know about any changes, no matter how small.  If you are in your 20s or 30s, you should have a clinical breast exam (CBE) by a health care provider every 1-3 years as part of a regular health exam.  If you are 62 or older, have a CBE every year. Also consider having a breast X-ray (mammogram) every year.  If you have a family history of breast cancer, talk to your health care provider about genetic screening.  If you are at high risk for breast cancer, talk to your health care provider about having an MRI and a mammogram every year.  Breast cancer gene (BRCA) assessment is recommended for women who have family members with BRCA-related cancers. BRCA-related cancers include: ? Breast. ? Ovarian. ? Tubal. ? Peritoneal cancers.  Results of the assessment will determine the need for genetic counseling and BRCA1 and BRCA2 testing. Cervical Cancer Your health care provider may recommend that you be screened regularly for cancer of the pelvic organs (ovaries, uterus, and vagina). This screening involves a pelvic examination, including checking for microscopic changes to the surface of your cervix (Pap test). You may be encouraged to have this screening done every 3 years, beginning at age 36.  For women ages 77-65, health care providers may recommend pelvic exams and Pap testing every 3 years, or they may recommend the Pap and pelvic exam, combined with testing for human papilloma virus (HPV), every 5 years. Some types of HPV increase your risk of cervical cancer. Testing for HPV may also be done on women of any age with unclear Pap test results.  Other health care providers may not recommend any screening for nonpregnant women who are considered low risk for pelvic cancer and who do not have symptoms. Ask your health care provider if a screening pelvic exam is right  for you.  If you have had past treatment for cervical cancer or a condition that could lead to cancer, you need Pap tests and screening for cancer for at least 20 years after your treatment. If Pap tests have been discontinued, your risk factors (such as having a new sexual partner) need to be reassessed to determine if screening should resume. Some women have medical problems that increase the chance of getting cervical cancer. In these cases, your health care provider may recommend more frequent screening and Pap tests. Colorectal Cancer  This type of cancer can be detected and often prevented.  Routine colorectal cancer screening usually begins at 74 years of age and continues through 74 years of age.  Your health care provider may recommend screening at an earlier age if you have risk factors for colon cancer.  Your health care provider may also recommend using home test kits to check for hidden blood in the stool.  A small camera  at the end of a tube can be used to examine your colon directly (sigmoidoscopy or colonoscopy). This is done to check for the earliest forms of colorectal cancer.  Routine screening usually begins at age 57.  Direct examination of the colon should be repeated every 5-10 years through 74 years of age. However, you may need to be screened more often if early forms of precancerous polyps or small growths are found. Skin Cancer  Check your skin from head to toe regularly.  Tell your health care provider about any new moles or changes in moles, especially if there is a change in a mole's shape or color.  Also tell your health care provider if you have a mole that is larger than the size of a pencil eraser.  Always use sunscreen. Apply sunscreen liberally and repeatedly throughout the day.  Protect yourself by wearing long sleeves, pants, a wide-brimmed hat, and sunglasses whenever you are outside. Heart disease, diabetes, and high blood pressure  High blood  pressure causes heart disease and increases the risk of stroke. High blood pressure is more likely to develop in: ? People who have blood pressure in the high end of the normal range (130-139/85-89 mm Hg). ? People who are overweight or obese. ? People who are African American.  If you are 70-25 years of age, have your blood pressure checked every 3-5 years. If you are 82 years of age or older, have your blood pressure checked every year. You should have your blood pressure measured twice-once when you are at a hospital or clinic, and once when you are not at a hospital or clinic. Record the average of the two measurements. To check your blood pressure when you are not at a hospital or clinic, you can use: ? An automated blood pressure machine at a pharmacy. ? A home blood pressure monitor.  If you are between 83 years and 79 years old, ask your health care provider if you should take aspirin to prevent strokes.  Have regular diabetes screenings. This involves taking a blood sample to check your fasting blood sugar level. ? If you are at a normal weight and have a low risk for diabetes, have this test once every three years after 74 years of age. ? If you are overweight and have a high risk for diabetes, consider being tested at a younger age or more often. Preventing infection Hepatitis B  If you have a higher risk for hepatitis B, you should be screened for this virus. You are considered at high risk for hepatitis B if: ? You were born in a country where hepatitis B is common. Ask your health care provider which countries are considered high risk. ? Your parents were born in a high-risk country, and you have not been immunized against hepatitis B (hepatitis B vaccine). ? You have HIV or AIDS. ? You use needles to inject street drugs. ? You live with someone who has hepatitis B. ? You have had sex with someone who has hepatitis B. ? You get hemodialysis treatment. ? You take certain  medicines for conditions, including cancer, organ transplantation, and autoimmune conditions. Hepatitis C  Blood testing is recommended for: ? Everyone born from [birth year] through 1966. ? Anyone with known risk factors for hepatitis C. Sexually transmitted infections (STIs)  You should be screened for sexually transmitted infections (STIs) including gonorrhea and chlamydia if: ? You are sexually active and are younger than 74 years of age. ? You are older  than 74 years of age and your health care provider tells you that you are at risk for this type of infection. ? Your sexual activity has changed since you were last screened and you are at an increased risk for chlamydia or gonorrhea. Ask your health care provider if you are at risk.  If you do not have HIV, but are at risk, it may be recommended that you take a prescription medicine daily to prevent HIV infection. This is called pre-exposure prophylaxis (PrEP). You are considered at risk if: ? You are sexually active and do not regularly use condoms or know the HIV status of your partner(s). ? You take drugs by injection. ? You are sexually active with a partner who has HIV. Talk with your health care provider about whether you are at high risk of being infected with HIV. If you choose to begin PrEP, you should first be tested for HIV. You should then be tested every 3 months for as long as you are taking PrEP. Pregnancy  If you are premenopausal and you may become pregnant, ask your health care provider about preconception counseling.  If you may become pregnant, take 400 to 800 micrograms (mcg) of folic acid every day.  If you want to prevent pregnancy, talk to your health care provider about birth control (contraception). Osteoporosis and menopause  Osteoporosis is a disease in which the bones lose minerals and strength with aging. This can result in serious bone fractures. Your risk for osteoporosis can be identified using a bone density  scan.  If you are 53 years of age or older, or if you are at risk for osteoporosis and fractures, ask your health care provider if you should be screened.  Ask your health care provider whether you should take a calcium or vitamin D supplement to lower your risk for osteoporosis.  Menopause may have certain physical symptoms and risks.  Hormone replacement therapy may reduce some of these symptoms and risks. Talk to your health care provider about whether hormone replacement therapy is right for you. Follow these instructions at home:  Schedule regular health, dental, and eye exams.  Stay current with your immunizations.  Do not use any tobacco products including cigarettes, chewing tobacco, or electronic cigarettes.  If you are pregnant, do not drink alcohol.  If you are breastfeeding, limit how much and how often you drink alcohol.  Limit alcohol intake to no more than 1 drink per day for nonpregnant women. One drink equals 12 ounces of beer, 5 ounces of , or 1 ounces of hard liquor.  Do not use street drugs.  Do not share needles.  Ask your health care provider for help if you need support or information about quitting drugs.  Tell your health care provider if you often feel depressed.  Tell your health care provider if you have ever been abused or do not feel safe at home. This information is not intended to replace advice given to you by your health care provider. Make sure you discuss any questions you have with your health care provider. Document Released: 02/26/2011 Document Revised: 01/19/2016 Document Reviewed: 05/17/2015 Elsevier Interactive Patient Education  2019 [employer].

## 2018-09-24 NOTE — Progress Notes (Addendum)
Subjective:   Colleen White is a 73 y.o. female who presents for Medicare Annual (Subsequent) preventive examination.  Review of Systems:  No ROS.  Medicare Wellness Visit. Additional risk factors are reflected in the social history.  Cardiac Risk Factors include: advanced age (>62men, >55 women);dyslipidemia Sleep patterns: gets up 0-1 times nightly to void and sleeps 6 hours nightly. Patient states she sleeps well but she is in the routine of going to bed late and getting up early.    Home Safety/Smoke Alarms: Feels safe in home. Smoke alarms in place.  Living environment; residence and Firearm Safety: 1-story house/ trailer. Lives with husband, no needs for DME, good support system Seat Belt Safety/Bike Helmet: Wears seat belt.     Objective:     Vitals: BP 106/62   Pulse (!) 58   Resp 17   Ht 5\' 3"  (1.6 m)   Wt 162 lb (73.5 kg)   SpO2 98%   BMI 28.70 kg/m   Body mass index is 28.7 kg/m.  Advanced Directives 09/24/2018 01/18/2018 07/19/2017 01/13/2017 12/26/2016 04/02/2016 02/15/2016  Does Patient Have a Medical Advance Directive? Yes Yes Yes Yes Yes Yes Yes  Type of Paramedic of [place];Living will [place];Living will [place];Living will [place];Living will [place];Living will Healthcare Power of [place];Living will;Advance instruction for mental health treatment  Does patient want to make changes to medical advance directive? - - - - - No - Patient declined No - Patient declined  Copy of [place] in Chart? No - copy requested No - copy requested - Yes - No - copy requested Yes    Tobacco Social History   Tobacco Use  Smoking Status Former Smoker  . Last attempt to quit: 08/27/1974  . Years since quitting: 44.1  Smokeless Tobacco Never Used     Counseling given: Not Answered  Past Medical History:  Diagnosis  Date  . Allergic rhinitis, cause unspecified   . Alopecia   . Anxiety   . Colon polyps   . Diverticulosis of colon (without mention of hemorrhage)   . Fibrocystic breast disease   . GERD (gastroesophageal reflux disease)   . Headache(784.0)   . Hemorrhoids   . History of echocardiogram    Echo 3/17:  Vigorous LVF, EF 65-70%, normal wall motion, grade 1 diastolic, mildly elevated LVOT velocity of 2.2 m/s-likely related to vigorous LVF  . History of stress test    Myoview normal in 2/05. //  Stress echo (11/15) with no evidence for ischemia or infarction.  [name] HLD (hyperlipidemia)   . HTN (hypertension)   . Hypertension   . Mitral valve disorders(424.0)   . Non Hodgkin's lymphoma ([place])    Treated at [facility] with surgery and XRT.  Did not have chemotherapy.   . OSA (obstructive sleep apnea) 03/29/2016  . Osteopenia   . PAF (paroxysmal atrial fibrillation) ([place])    a. CHADS2-VASc=3 //  b. Xarelto started in 2017  . Pulmonary nodule    Presumed benign after observation.   . Sleep apnea   . Unspecified glaucoma(365.9)    Past Surgical History:  Procedure Laterality Date  . ABDOMINAL HYSTERECTOMY  1996  . BASAL CELL CARCINOMA EXCISION     06/2017  . BREAST CYST ASPIRATION  2017  . EYE SURGERY  2009   Cataract  . 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  Cloyd Stagers, [place]  . s/p ORIF prox phalanx  02/2007   right little finger----Dr Fredna Dow  . s/p TAH/BSO     Family History  Problem Relation Age of Onset  . Hypertension Mother   . Breast cancer Mother   . Cancer Father        throat  . Diabetes Father   . Heart disease Father   . Prostate cancer Father   . Cancer Sister        breast (sister)  . Colon cancer Maternal Uncle    Social History   Socioeconomic History  . Marital status: Married    Spouse name: Not on file  . Number of children: 0  . Years of education: Not on file  . Highest education level: Not on file  Occupational History  . Not on  file  Social Needs  . Financial resource strain: Not hard at all  . Food insecurity:    Worry: Never true    Inability: Never true  . Transportation needs:    Medical: No    Non-medical: No  Tobacco Use  . Smoking status: Former Smoker    Last attempt to quit: 08/27/1974    Years since quitting: 44.1  . Smokeless tobacco: Never Used  Substance and Sexual Activity  . Alcohol use: No    Comment: occasionally  . Drug use: No  . Sexual activity: Yes  Lifestyle  . Physical activity:    Days per week: 2 days    Minutes per session: 20 min  . Stress: Not at all  Relationships  . Social connections:    Talks on phone: More than three times a week    Gets together: More than three times a week    Attends religious service: 1 to 4 times per year    Active member of club or organization: Yes    Attends meetings of clubs or organizations: More than 4 times per year    Relationship status: Married  Other Topics Concern  . Not on file  Social History Narrative  . Not on file    Outpatient Encounter Medications as of 09/24/2018  Medication Sig  . brimonidine-timolol (COMBIGAN) 0.2-0.5 % ophthalmic solution Combigan 0.2 %-0.5 % eye drops  . Calcium Carbonate-Vitamin D (CALCIUM 500 + D PO) Take 1 tablet by mouth 2 (two) times daily.  . Cholecalciferol (VITAMIN D3) 1000 units CAPS Take by mouth.  . Coenzyme Q10 (CO Q 10) 100 MG CAPS Take 1 tablet by mouth daily.  [name] dicyclomine (BENTYL) 10 MG capsule Take 1 capsule (10 mg total) by mouth 3 (three) times daily before meals. (Patient taking differently: Take 10 mg by mouth 3 (three) times daily before meals. )  . diltiazem (CARDIZEM CD) 120 MG 24 hr capsule TAKE (1) CAPSULE DAILY.  [name] docusate sodium (COLACE) 100 MG capsule Take 100 mg by mouth at bedtime.  [name] esomeprazole (NEXIUM) 20 MG packet Take 20 mg by mouth daily before breakfast.  . gabapentin (NEURONTIN) 100 MG capsule Take 1 capsule (100 mg total) daily by mouth. (Patient taking  differently: Take 400 mg by mouth as needed. )  . loratadine (CLARITIN) 10 MG tablet Take by mouth daily as needed.   [name] MINOXIDIL FOR WOMEN EX Apply topically. Applies to scalp for hairloss  . mirtazapine (REMERON) 15 MG tablet Take 15 mg by mouth as needed. Takes 1/2 tablet at bedtime for itching of scalp   . Multiple Vitamins-Minerals (PRESERVISION AREDS 2 PO) Take 1  tablet by mouth 2 (two) times daily.   . pravastatin (PRAVACHOL) 40 MG tablet Take 1 tablet (40 mg total) by mouth daily.  . Probiotic Product (PROBIOTIC DAILY PO) Take 1 tablet by mouth daily.  [name] telmisartan-hydrochlorothiazide (MICARDIS HCT) 80-12.5 MG tablet Take 1 tablet by mouth daily.  . traMADol (ULTRAM) 50 MG tablet TAKE ONE TABLET EVERY 6 HOURS AS NEEDED.  [name] tretinoin (RETIN-A) 0.05 % cream Once daily   . vitamin C (ASCORBIC ACID) 500 MG tablet Take 500 mg by mouth daily.  Alveda Reasons 20 MG TABS tablet TAKE 1 TABLET DAILY WITH SUPPER.  . [DISCONTINUED] traMADol (ULTRAM) 50 MG tablet Take 1 tablet (50 mg total) by mouth every 6 (six) hours as needed.   No facility-administered encounter medications on file as of 09/24/2018.     Activities of Daily Living In your present state of health, do you have any difficulty performing the following activities: 09/24/2018  Hearing? N  Vision? N  Difficulty concentrating or making decisions? N  Walking or climbing stairs? N  Dressing or bathing? N  Doing errands, shopping? N  Preparing Food and eating ? N  Using the Toilet? N  In the past six months, have you accidently leaked urine? N  Do you have problems with loss of bowel control? N  Managing your Medications? N  Managing your Finances? N  Housekeeping or managing your Housekeeping? N  Some recent data might be hidden    Patient Care Team: Janith Lima, MD as PCP - General (Internal Medicine) Sueanne Margarita, MD as PCP - Sleep Medicine (Sleep Medicine)    Assessment:   This is a routine wellness examination for  [place]. Physical assessment deferred to PCP.   Exercise Activities and Dietary recommendations Current Exercise Habits: Structured exercise class, Type of exercise: treadmill, Time (Minutes): 20, Frequency (Times/Week): 2, Weekly Exercise (Minutes/Week): 40, Intensity: Mild, Exercise limited by: orthopedic condition(s)  Diet (meal preparation, eat out, water intake, caffeinated beverages, dairy products, fruits and vegetables): in general, a "healthy" diet     Reviewed heart healthy diet. Discussed weight loss diet and tips. Encouraged patient to increase daily water and healthy fluid intake. Relevant patient education assigned to patient using Emmi.  Goals    . Patient Stated     Eat healthy and exercise, drink more water.       Fall Risk Fall Risk  09/24/2018 09/10/2018 03/26/2018 02/18/2018 01/13/2017  Falls in the past year? 0 0 Yes Yes No  Comment - - Emmi Telephone Survey: data to providers prior to load - -  Number falls in past yr: 0 0 1 1 -  Comment - - Emmi Telephone Survey Actual Response = 1 - -  Injury with Fall? - 0 No Yes -  Risk for fall due to : - - - Other (Comment) -  Follow up - Falls evaluation completed - Falls evaluation completed -    Depression Screen PHQ 2/9 Scores 09/24/2018 09/10/2018 02/18/2018 01/13/2017  PHQ - 2 Score 0 1 0 0  PHQ- 9 Score 2 5 0 -     Cognitive Function       Ad8 score reviewed for issues:  Issues making decisions: no  Less interest in hobbies / activities: no  Repeats questions, stories (family complaining): no  Trouble using ordinary gadgets (microwave, computer, phone):no  Forgets the month or year: no  Mismanaging finances: no  Remembering appts: no  Daily problems with thinking and/or memory: no Ad8 score  is= 0  Immunization History  Administered Date(s) Administered  . Influenza Split 09/05/2011, 07/30/2012  . Influenza, High Dose Seasonal PF 10/11/2015, 09/11/2016, 06/25/2017, 06/19/2018  . Influenza,inj,Quad  PF,6+ Mos 08/26/2014  . Influenza-Unspecified 06/27/2013  . Pneumococcal Conjugate-13 12/15/2014  . Pneumococcal Polysaccharide-23 12/21/2015  . Tdap 09/30/2012  . Zoster 09/30/2009  . Zoster Recombinat (Shingrix) 09/03/2017, 11/04/2017   Screening Tests Health Maintenance  Topic Date Due  . MAMMOGRAM  12/27/2019  . COLONOSCOPY  09/06/2020  . TETANUS/TDAP  09/30/2022  . INFLUENZA VACCINE  Completed  . DEXA SCAN  Completed  . Hepatitis C Screening  Completed  . PNA vac Low Risk Adult  Completed      Plan:      Reviewed health maintenance screenings with patient today and relevant education, vaccines, and/or referrals were provided.   Continue doing brain stimulating activities (puzzles, reading, adult coloring books, staying active) to keep memory sharp.   Continue to eat heart healthy diet (full of fruits, vegetables, whole grains, lean protein, water--limit salt, fat, and sugar intake) and increase physical activity as tolerated.   I have personally reviewed and noted the following in the patient's chart:   . Medical and social history . Use of alcohol, tobacco or illicit drugs  . Current medications and supplements . Functional ability and status . Nutritional status . Physical activity . Advanced directives . List of other physicians . Vitals . Screenings to include cognitive, depression, and falls . Referrals and appointments  In addition, I have reviewed and discussed with patient certain preventive protocols, quality metrics, and best practice recommendations. A written personalized care plan for preventive services as well as general preventive health recommendations were provided to patient.     Michiel Cowboy, RN  09/24/2018  Medical screening examination/treatment/procedure(s) were performed by non-physician practitioner and as supervising physician I was immediately available for consultation/collaboration. I agree with above. Scarlette Calico, MD

## 2018-09-30 DIAGNOSIS — R102 Pelvic and perineal pain: Secondary | ICD-10-CM | POA: Diagnosis not present

## 2018-09-30 DIAGNOSIS — R319 Hematuria, unspecified: Secondary | ICD-10-CM | POA: Diagnosis not present

## 2018-09-30 DIAGNOSIS — R350 Frequency of micturition: Secondary | ICD-10-CM | POA: Diagnosis not present

## 2018-10-02 ENCOUNTER — Other Ambulatory Visit (INDEPENDENT_AMBULATORY_CARE_PROVIDER_SITE_OTHER): Payer: Medicare Other

## 2018-10-02 ENCOUNTER — Other Ambulatory Visit: Payer: Self-pay | Admitting: Internal Medicine

## 2018-10-02 ENCOUNTER — Encounter: Payer: Self-pay | Admitting: Internal Medicine

## 2018-10-02 DIAGNOSIS — Z1211 Encounter for screening for malignant neoplasm of colon: Secondary | ICD-10-CM

## 2018-10-02 DIAGNOSIS — I10 Essential (primary) hypertension: Secondary | ICD-10-CM

## 2018-10-02 LAB — FECAL OCCULT BLOOD, IMMUNOCHEMICAL: Fecal Occult Bld: NEGATIVE

## 2018-10-02 MED ORDER — TELMISARTAN-HCTZ 80-12.5 MG PO TABS: 1.0000 | ORAL_TABLET | Freq: Every day | ORAL | 1 refills | Status: DC

## 2018-10-02 NOTE — Addendum Note (Signed)
Addended by: Karle Barr on: 10/02/2018 02:31 PM   Modules accepted: Orders

## 2018-10-23 ENCOUNTER — Emergency Department (HOSPITAL_BASED_OUTPATIENT_CLINIC_OR_DEPARTMENT_OTHER)
Admission: EM | Admit: 2018-10-23 | Discharge: 2018-10-23 | Disposition: A | Discharge status: Home / Self Care | Payer: Medicare Other | Attending: Emergency Medicine | Admitting: Emergency Medicine

## 2018-10-23 ENCOUNTER — Other Ambulatory Visit: Payer: Self-pay

## 2018-10-23 ENCOUNTER — Emergency Department (HOSPITAL_BASED_OUTPATIENT_CLINIC_OR_DEPARTMENT_OTHER): Payer: Medicare Other

## 2018-10-23 ENCOUNTER — Encounter (HOSPITAL_BASED_OUTPATIENT_CLINIC_OR_DEPARTMENT_OTHER): Payer: Self-pay | Admitting: Emergency Medicine

## 2018-10-23 DIAGNOSIS — M170 Bilateral primary osteoarthritis of knee: Secondary | ICD-10-CM | POA: Diagnosis not present

## 2018-10-23 DIAGNOSIS — Z87891 Personal history of nicotine dependence: Secondary | ICD-10-CM | POA: Diagnosis not present

## 2018-10-23 DIAGNOSIS — Z7901 Long term (current) use of anticoagulants: Secondary | ICD-10-CM | POA: Insufficient documentation

## 2018-10-23 DIAGNOSIS — Z79899 Other long term (current) drug therapy: Secondary | ICD-10-CM | POA: Diagnosis not present

## 2018-10-23 DIAGNOSIS — Z8572 Personal history of non-Hodgkin lymphomas: Secondary | ICD-10-CM | POA: Insufficient documentation

## 2018-10-23 DIAGNOSIS — I10 Essential (primary) hypertension: Secondary | ICD-10-CM | POA: Diagnosis not present

## 2018-10-23 DIAGNOSIS — M25561 Pain in right knee: Secondary | ICD-10-CM | POA: Diagnosis not present

## 2018-10-23 DIAGNOSIS — M25562 Pain in left knee: Secondary | ICD-10-CM | POA: Diagnosis not present

## 2018-10-23 MED ORDER — PREDNISONE 10 MG (21) PO TBPK: ORAL_TABLET | ORAL | 0 refills | Status: DC

## 2018-10-23 MED ORDER — DEXAMETHASONE SODIUM PHOSPHATE 10 MG/ML IJ SOLN
10.0000 mg | Freq: Once | INTRAMUSCULAR | Status: AC
  Administered 2018-10-23: 10 mg via INTRAMUSCULAR
  Filled 2018-10-23: qty 1

## 2018-10-23 NOTE — ED Notes (Signed)
Patient transported to X-ray 

## 2018-10-23 NOTE — ED Provider Notes (Signed)
[facility] EMERGENCY DEPARTMENT Provider Note   CSN: [id number] Arrival date & time: 10/23/18  1019    History   Chief Complaint Chief Complaint  Patient presents with  . Knee Pain    HPI Colleen White is a 72 y.o. female.     Pt presents to the ED today with bilateral knee pain (R>L).  The pt said she's been exercising for the past few weeks.  She started with the treadmill and her knees did fine.  She thought she'd try the bike and that has irritated her knees.  She has pain on the medial aspect of both knees.  It sounds like she did not fit the bike properly to her body size.     Past Medical History:  Diagnosis Date  . Allergic rhinitis, cause unspecified   . Alopecia   . Anxiety   . Colon polyps   . Diverticulosis of colon (without mention of hemorrhage)   . Fibrocystic breast disease   . GERD (gastroesophageal reflux disease)   . Headache(784.0)   . Hemorrhoids   . History of echocardiogram    Echo 3/17:  Vigorous LVF, EF 65-70%, normal wall motion, grade 1 diastolic, mildly elevated LVOT velocity of 2.2 m/s-likely related to vigorous LVF  . History of stress test    Myoview normal in 2/05. //  Stress echo (11/15) with no evidence for ischemia or infarction.  [name] HLD (hyperlipidemia)   . HTN (hypertension)   . Hypertension   . Mitral valve disorders(424.0)   . Non Hodgkin's lymphoma ([place])    Treated at [facility] with surgery and XRT.  Did not have chemotherapy.   . OSA (obstructive sleep apnea) 03/29/2016  . Osteopenia   . PAF (paroxysmal atrial fibrillation) ([place])    a. CHADS2-VASc=3 //  b. Xarelto started in 2017  . Pulmonary nodule    Presumed benign after observation.   . Sleep apnea   . Unspecified glaucoma(365.9)     Patient Active Problem List   Diagnosis Date Noted  . NASH (nonalcoholic steatohepatitis) 09/10/2018  . B12 deficiency due to diet 02/18/2018  . Primary osteoarthritis involving multiple joints 06/30/2017  . 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 Current moderate episode of major depressive disorder without prior episode ([place]) 04/07/2017  . Vitamin D deficiency 12/24/2016  . OSA (obstructive sleep apnea) 03/29/2016  . Paroxysmal atrial fibrillation ([place]) 11/08/2015  . Varicose veins of right lower extremity with pain 04/13/2015  . Routine general medical examination at a health care facility 11/30/2013  . 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 [facility]) 10/20/2010  . GLAUCOMA 08/04/2008  . ALLERGIC RHINITIS 12/17/2007  . Osteoporosis 12/17/2007  . HYPERCHOLESTEROLEMIA 07/31/2007  . Essential hypertension 07/31/2007  . MITRAL VALVE PROLAPSE 07/31/2007  . GERD 07/31/2007    Past Surgical History:  Procedure Laterality Date  . ABDOMINAL HYSTERECTOMY  1996  . BASAL CELL CARCINOMA EXCISION     06/2017  . BREAST CYST ASPIRATION  2017  . EYE SURGERY  2009   Cataract  . s/p ELap----follicular lymphoma  56/9794   4cm mesenteric mass, Dr. Cloyd Stagers, [facility]  . s/p ORIF prox phalanx  02/2007   right little finger----Dr Fredna Dow  . s/p TAH/BSO       OB History   No obstetric history on file.      Home Medications    Prior to Admission medications   Medication Sig Start Date End Date Taking? Authorizing Provider  brimonidine-timolol (COMBIGAN) 0.2-0.5 % ophthalmic solution Combigan 0.2 %-  0.5 % eye drops    [provider]  Calcium Carbonate-Vitamin D (CALCIUM 500 + D PO) Take 1 tablet by mouth 2 (two) times daily.    [provider]  Cholecalciferol (VITAMIN D3) 1000 units CAPS Take by mouth.    [provider]  Coenzyme Q10 (CO Q 10) 100 MG CAPS Take 1 tablet by mouth daily.    [provider]  dicyclomine (BENTYL) 10 MG capsule Take 1 capsule (10 mg total) by mouth 3 (three) times daily before meals. Patient taking differently: Take 10 mg by mouth 3 (three) times daily before meals.  04/24/18   Ladene Artist, MD  diltiazem (CARDIZEM CD) 120 MG 24 hr capsule TAKE (1) CAPSULE  DAILY. 10/16/17   Larey Dresser, MD  docusate sodium (COLACE) 100 MG capsule Take 100 mg by mouth at bedtime.    [provider]  esomeprazole (NEXIUM) 20 MG packet Take 20 mg by mouth daily before breakfast.    [provider]  gabapentin (NEURONTIN) 100 MG capsule Take 1 capsule (100 mg total) daily by mouth. Patient taking differently: Take 400 mg by mouth as needed.  07/02/17   Janith Lima, MD  loratadine (CLARITIN) 10 MG tablet Take by mouth daily as needed.     [provider]  MINOXIDIL FOR WOMEN EX Apply topically. Applies to scalp for hairloss    [provider]  mirtazapine (REMERON) 15 MG tablet Take 15 mg by mouth as needed. Takes 1/2 tablet at bedtime for itching of scalp     [provider]  Multiple Vitamins-Minerals (PRESERVISION AREDS 2 PO) Take 1 tablet by mouth 2 (two) times daily.     [provider]  pravastatin (PRAVACHOL) 40 MG tablet Take 1 tablet (40 mg total) by mouth daily. 02/18/18   Janith Lima, MD  predniSONE (STERAPRED UNI-PAK 21 TAB) 10 MG (21) TBPK tablet Take 6 tabs for 2 days, then 5 for 2 days, then 4 for 2 days, then 3 for 2 days, 2 for 2 days, then 1 for 2 days 10/23/18   Isla Pence, MD  Probiotic Product (PROBIOTIC DAILY PO) Take 1 tablet by mouth daily.    [provider]  telmisartan-hydrochlorothiazide (MICARDIS HCT) 80-12.5 MG tablet Take 1 tablet by mouth daily. 10/02/18   Janith Lima, MD  traMADol (ULTRAM) 50 MG tablet TAKE ONE TABLET EVERY 6 HOURS AS NEEDED. 09/24/18   Janith Lima, MD  tretinoin (RETIN-A) 0.05 % cream Once daily     [provider]  vitamin C (ASCORBIC ACID) 500 MG tablet Take 500 mg by mouth daily.    [provider]  XARELTO 20 MG TABS tablet TAKE 1 TABLET DAILY WITH SUPPER. 11/14/17   Bensimhon, Shaune Pascal, MD    Family History Family History  Problem Relation Age of Onset  . Hypertension Mother   . Breast cancer Mother   . Cancer  Father        throat  . Diabetes Father   . Heart disease Father   . Prostate cancer Father   . Cancer Sister        breast (sister)  . Colon cancer Maternal Uncle     Social History Social History   Tobacco Use  . Smoking status: Former Smoker    Last attempt to quit: 08/27/1974    Years since quitting: 44.1  . Smokeless tobacco: Never Used  Substance Use Topics  . Alcohol use: No  Comment: occasionally  . Drug use: No     Allergies   Patient has no known allergies.   Review of Systems Review of Systems  Musculoskeletal:       Bilateral knee pain  All other systems reviewed and are negative.    Physical Exam Updated Vital Signs BP 137/76 (BP Location: Right Arm)   Pulse (!) 46   Temp 97.6 F (36.4 C) (Oral)   Resp 20   Ht 5' 3"  (1.6 m)   Wt 70.3 kg   SpO2 100%   BMI 27.46 kg/m   Physical Exam Vitals signs and nursing note reviewed.  Constitutional:      Appearance: Normal appearance.  HENT:     Head: Normocephalic and atraumatic.     Right Ear: External ear normal.     Left Ear: External ear normal.     Nose: Nose normal.     Mouth/Throat:     Mouth: Mucous membranes are moist.  Eyes:     Extraocular Movements: Extraocular movements intact.     Pupils: Pupils are equal, round, and reactive to light.  Neck:     Musculoskeletal: Normal range of motion and neck supple.  Cardiovascular:     Rate and Rhythm: Normal rate and regular rhythm.  Pulmonary:     Effort: Pulmonary effort is normal.     Breath sounds: Normal breath sounds.  Abdominal:     General: Abdomen is flat.  Musculoskeletal:     Right knee: Tenderness found. Medial joint line tenderness noted.     Left knee: Tenderness found. Medial joint line tenderness noted.  Skin:    General: Skin is warm.     Capillary Refill: Capillary refill takes less than 2 seconds.  Neurological:     General: No focal deficit present.     Mental Status: She is alert and oriented to person, place,  and time.  Psychiatric:        Mood and Affect: Mood normal.        Behavior: Behavior normal.      ED Treatments / Results  Labs (all labs ordered are listed, but only abnormal results are displayed) Labs Reviewed - No data to display  EKG None  Radiology Dg Knee Complete 4 Views Left  Result Date: 10/23/2018 CLINICAL DATA:  Acute bilateral knee pain after exercising 5 days ago. EXAM: LEFT KNEE - COMPLETE 4+ VIEW COMPARISON:  Radiographs of Jan 18, 2018. FINDINGS: No evidence of fracture, dislocation, or joint effusion. Minimal narrowing of medial joint space is noted. Soft tissues are unremarkable. IMPRESSION: Minimal degenerative joint disease is noted medially. No acute abnormality seen in the left knee. Electronically Signed   By: Marijo Conception, M.D.   On: 10/23/2018 11:24   Dg Knee Complete 4 Views Right  Result Date: 10/23/2018 CLINICAL DATA:  Acute bilateral knee pain after exercising 5 days ago. EXAM: RIGHT KNEE - COMPLETE 4+ VIEW COMPARISON:  Radiographs of Jan 18, 2018. FINDINGS: No evidence of fracture, dislocation, or joint effusion. Mild narrowing of medial joint space is noted. Soft tissues are unremarkable. IMPRESSION: Mild degenerative joint disease is noted medially. No acute abnormality seen in the right knee. Electronically Signed   By: Marijo Conception, M.D.   On: 10/23/2018 11:23    Procedures Procedures (including critical care time)  Medications Ordered in ED Medications  dexamethasone (DECADRON) injection 10 mg (10 mg Intramuscular Given 10/23/18 1046)     Initial Impression / Assessment and Plan /  ED Course  I have reviewed the triage vital signs and the nursing notes.  Pertinent labs & imaging results that were available during my care of the patient were reviewed by me and considered in my medical decision making (see chart for details).        No evidence of fx, just degenerative changes in knees.  She likely strained them while improperly  riding the bike.  She will be d/c home with prednisone.  Return if worse.  Pt has seen [facility] in the past.  She is to f/u with them if knee pain persists.  Final Clinical Impressions(s) / ED Diagnoses   Final diagnoses:  Primary osteoarthritis of both knees    ED Discharge Orders         Ordered    predniSONE (STERAPRED UNI-PAK 21 TAB) 10 MG (21) TBPK tablet     10/23/18 1131           Isla Pence, MD 10/23/18 1136

## 2018-10-23 NOTE — ED Triage Notes (Signed)
Reports bilateral knee pain after using bicycle while working out for 3-4 days.  This worsened on Monday.

## 2018-10-24 ENCOUNTER — Ambulatory Visit: Payer: Medicare Other | Admitting: Emergency Medicine

## 2018-10-24 DIAGNOSIS — R350 Frequency of micturition: Secondary | ICD-10-CM | POA: Diagnosis not present

## 2018-10-24 DIAGNOSIS — Z124 Encounter for screening for malignant neoplasm of cervix: Secondary | ICD-10-CM | POA: Diagnosis not present

## 2018-10-24 DIAGNOSIS — Z01419 Encounter for gynecological examination (general) (routine) without abnormal findings: Secondary | ICD-10-CM | POA: Diagnosis not present

## 2018-10-24 DIAGNOSIS — R319 Hematuria, unspecified: Secondary | ICD-10-CM | POA: Diagnosis not present

## 2018-10-27 ENCOUNTER — Other Ambulatory Visit (HOSPITAL_COMMUNITY): Payer: Self-pay

## 2018-10-27 MED ORDER — RIVAROXABAN 20 MG PO TABS: ORAL_TABLET | ORAL | 2 refills | Status: DC

## 2018-10-27 MED ORDER — DILTIAZEM HCL ER COATED BEADS 120 MG PO CP24: ORAL_CAPSULE | ORAL | 3 refills | Status: DC

## 2018-10-28 DIAGNOSIS — L821 Other seborrheic keratosis: Secondary | ICD-10-CM | POA: Diagnosis not present

## 2018-10-28 DIAGNOSIS — L810 Postinflammatory hyperpigmentation: Secondary | ICD-10-CM | POA: Diagnosis not present

## 2018-10-28 DIAGNOSIS — L720 Epidermal cyst: Secondary | ICD-10-CM | POA: Diagnosis not present

## 2018-10-28 DIAGNOSIS — L84 Corns and callosities: Secondary | ICD-10-CM | POA: Diagnosis not present

## 2018-11-03 ENCOUNTER — Other Ambulatory Visit: Payer: Self-pay | Admitting: Internal Medicine

## 2018-11-03 DIAGNOSIS — E7800 Pure hypercholesterolemia, unspecified: Secondary | ICD-10-CM

## 2018-11-06 DIAGNOSIS — M1712 Unilateral primary osteoarthritis, left knee: Secondary | ICD-10-CM | POA: Diagnosis not present

## 2018-11-06 DIAGNOSIS — M170 Bilateral primary osteoarthritis of knee: Secondary | ICD-10-CM | POA: Diagnosis not present

## 2018-11-06 DIAGNOSIS — M1711 Unilateral primary osteoarthritis, right knee: Secondary | ICD-10-CM | POA: Diagnosis not present

## 2018-11-18 DIAGNOSIS — M170 Bilateral primary osteoarthritis of knee: Secondary | ICD-10-CM | POA: Diagnosis not present

## 2018-12-31 DIAGNOSIS — H10413 Chronic giant papillary conjunctivitis, bilateral: Secondary | ICD-10-CM | POA: Diagnosis not present

## 2019-01-07 DIAGNOSIS — H04123 Dry eye syndrome of bilateral lacrimal glands: Secondary | ICD-10-CM | POA: Diagnosis not present

## 2019-01-07 DIAGNOSIS — H10413 Chronic giant papillary conjunctivitis, bilateral: Secondary | ICD-10-CM | POA: Diagnosis not present

## 2019-01-08 LAB — HM MAMMOGRAPHY: HM Mammogram: NORMAL (ref 0–4)

## 2019-01-20 ENCOUNTER — Other Ambulatory Visit (HOSPITAL_COMMUNITY): Payer: Self-pay | Admitting: Cardiology

## 2019-02-05 DIAGNOSIS — Z9189 Other specified personal risk factors, not elsewhere classified: Secondary | ICD-10-CM | POA: Diagnosis not present

## 2019-02-05 DIAGNOSIS — R922 Inconclusive mammogram: Secondary | ICD-10-CM | POA: Diagnosis not present

## 2019-02-11 DIAGNOSIS — H401131 Primary open-angle glaucoma, bilateral, mild stage: Secondary | ICD-10-CM | POA: Diagnosis not present

## 2019-02-11 DIAGNOSIS — H52203 Unspecified astigmatism, bilateral: Secondary | ICD-10-CM | POA: Diagnosis not present

## 2019-02-11 DIAGNOSIS — H02054 Trichiasis without entropian left upper eyelid: Secondary | ICD-10-CM | POA: Diagnosis not present

## 2019-02-11 DIAGNOSIS — H02403 Unspecified ptosis of bilateral eyelids: Secondary | ICD-10-CM | POA: Diagnosis not present

## 2019-03-09 ENCOUNTER — Ambulatory Visit: Payer: Medicare Other | Admitting: Internal Medicine

## 2019-03-11 ENCOUNTER — Ambulatory Visit: Payer: Medicare Other | Admitting: Internal Medicine

## 2019-03-24 ENCOUNTER — Other Ambulatory Visit: Payer: Self-pay | Admitting: Internal Medicine

## 2019-03-24 DIAGNOSIS — M159 Polyosteoarthritis, unspecified: Secondary | ICD-10-CM

## 2019-03-24 DIAGNOSIS — M8949 Other hypertrophic osteoarthropathy, multiple sites: Secondary | ICD-10-CM

## 2019-04-01 DIAGNOSIS — C8290 Follicular lymphoma, unspecified, unspecified site: Secondary | ICD-10-CM | POA: Diagnosis not present

## 2019-04-01 LAB — HEPATIC FUNCTION PANEL
ALT: 12 (ref 7–35)
AST: 15 (ref 13–35)
Alkaline Phosphatase: 89 (ref 25–125)
Bilirubin, Total: 0.4

## 2019-04-01 LAB — BASIC METABOLIC PANEL
BUN: 18 (ref 4–21)
Creatinine: 0.8 (ref 0.5–1.1)
Glucose: 106
Potassium: 3.9 (ref 3.4–5.3)
Sodium: 140 (ref 137–147)

## 2019-04-01 LAB — CBC AND DIFFERENTIAL
HCT: 38 (ref 36–46)
Hemoglobin: 13.2 (ref 12.0–16.0)
Platelets: 218 (ref 150–399)
WBC: 5.2

## 2019-04-03 DIAGNOSIS — C8290 Follicular lymphoma, unspecified, unspecified site: Secondary | ICD-10-CM | POA: Diagnosis not present

## 2019-04-03 DIAGNOSIS — K5730 Diverticulosis of large intestine without perforation or abscess without bleeding: Secondary | ICD-10-CM | POA: Diagnosis not present

## 2019-04-03 DIAGNOSIS — K760 Fatty (change of) liver, not elsewhere classified: Secondary | ICD-10-CM | POA: Diagnosis not present

## 2019-04-06 ENCOUNTER — Other Ambulatory Visit: Payer: Self-pay | Admitting: Internal Medicine

## 2019-04-06 DIAGNOSIS — I10 Essential (primary) hypertension: Secondary | ICD-10-CM

## 2019-04-07 DIAGNOSIS — C8290 Follicular lymphoma, unspecified, unspecified site: Secondary | ICD-10-CM | POA: Diagnosis not present

## 2019-04-09 DIAGNOSIS — R208 Other disturbances of skin sensation: Secondary | ICD-10-CM | POA: Diagnosis not present

## 2019-04-09 DIAGNOSIS — L810 Postinflammatory hyperpigmentation: Secondary | ICD-10-CM | POA: Diagnosis not present

## 2019-04-09 DIAGNOSIS — L658 Other specified nonscarring hair loss: Secondary | ICD-10-CM | POA: Diagnosis not present

## 2019-04-09 DIAGNOSIS — D229 Melanocytic nevi, unspecified: Secondary | ICD-10-CM | POA: Diagnosis not present

## 2019-04-09 DIAGNOSIS — L821 Other seborrheic keratosis: Secondary | ICD-10-CM | POA: Diagnosis not present

## 2019-04-09 DIAGNOSIS — L299 Pruritus, unspecified: Secondary | ICD-10-CM | POA: Diagnosis not present

## 2019-04-09 DIAGNOSIS — Z85828 Personal history of other malignant neoplasm of skin: Secondary | ICD-10-CM | POA: Diagnosis not present

## 2019-04-09 DIAGNOSIS — L720 Epidermal cyst: Secondary | ICD-10-CM | POA: Diagnosis not present

## 2019-04-20 ENCOUNTER — Encounter: Payer: Self-pay | Admitting: Internal Medicine

## 2019-04-20 ENCOUNTER — Other Ambulatory Visit (INDEPENDENT_AMBULATORY_CARE_PROVIDER_SITE_OTHER): Payer: Medicare Other

## 2019-04-20 ENCOUNTER — Other Ambulatory Visit: Payer: Self-pay

## 2019-04-20 ENCOUNTER — Ambulatory Visit (INDEPENDENT_AMBULATORY_CARE_PROVIDER_SITE_OTHER): Payer: Medicare Other | Admitting: Internal Medicine

## 2019-04-20 VITALS — BP 130/78 | HR 66 | Temp 97.7°F | Wt 147.1 lb

## 2019-04-20 DIAGNOSIS — E559 Vitamin D deficiency, unspecified: Secondary | ICD-10-CM | POA: Diagnosis not present

## 2019-04-20 DIAGNOSIS — Z23 Encounter for immunization: Secondary | ICD-10-CM | POA: Diagnosis not present

## 2019-04-20 DIAGNOSIS — E7800 Pure hypercholesterolemia, unspecified: Secondary | ICD-10-CM

## 2019-04-20 DIAGNOSIS — I480 Paroxysmal atrial fibrillation: Secondary | ICD-10-CM

## 2019-04-20 DIAGNOSIS — I10 Essential (primary) hypertension: Secondary | ICD-10-CM

## 2019-04-20 DIAGNOSIS — R350 Frequency of micturition: Secondary | ICD-10-CM | POA: Diagnosis not present

## 2019-04-20 DIAGNOSIS — E538 Deficiency of other specified B group vitamins: Secondary | ICD-10-CM

## 2019-04-20 DIAGNOSIS — K7581 Nonalcoholic steatohepatitis (NASH): Secondary | ICD-10-CM

## 2019-04-20 LAB — LIPID PANEL
Cholesterol: 148 mg/dL (ref 0–200)
HDL: 42.5 mg/dL (ref >39.00)
LDL Cholesterol: 88 mg/dL (ref 0–99)
NonHDL: 105.62
Total CHOL/HDL Ratio: 3
Triglycerides: 88 mg/dL (ref 0.0–149.0)
VLDL: 17.6 mg/dL (ref 0.0–40.0)

## 2019-04-20 LAB — URINALYSIS, ROUTINE W REFLEX MICROSCOPIC
Bilirubin Urine: NEGATIVE
Ketones, ur: NEGATIVE
Leukocytes,Ua: NEGATIVE
Nitrite: NEGATIVE
Specific Gravity, Urine: <=1.005 — AB (ref 1.000–1.030)
Total Protein, Urine: NEGATIVE
Urine Glucose: NEGATIVE
Urobilinogen, UA: 0.2 (ref 0.0–1.0)
pH: 6 (ref 5.0–8.0)

## 2019-04-20 NOTE — Progress Notes (Signed)
Subjective:  Patient ID: Colleen White, female    DOB: 1946/04/01  Age: 73 y.o. MRN: [id number]  CC: Hypertension and Hyperlipidemia   HPI Colleen White presents for f/up - She has intentionally lost weight with diet and exercise.  She does not experience CP or DOE.  She has good endurance with exercise but complains of chronic fatigue.  She also complains of a several week history of urinary frequency.  She had lab work done recently by an oncologist.  Outpatient Medications Prior to Visit  Medication Sig Dispense Refill  . brimonidine-timolol (COMBIGAN) 0.2-0.5 % ophthalmic solution Combigan 0.2 %-0.5 % eye drops    . Calcium Carbonate-Vitamin D (CALCIUM 500 + D PO) Take 1 tablet by mouth 2 (two) times daily.    . Cholecalciferol (VITAMIN D3) 1000 units CAPS Take by mouth.    . Coenzyme Q10 (CO Q 10) 100 MG CAPS Take 1 tablet by mouth daily.    [name] diltiazem (CARDIZEM CD) 120 MG 24 hr capsule TAKE (1) CAPSULE DAILY. 90 capsule 3  . docusate sodium (COLACE) 100 MG capsule Take 100 mg by mouth at bedtime.    [name] esomeprazole (NEXIUM) 20 MG packet Take 20 mg by mouth daily before breakfast.    . gabapentin (NEURONTIN) 100 MG capsule Take 1 capsule (100 mg total) daily by mouth. (Patient taking differently: Take 400 mg by mouth as needed. ) 90 capsule 0  . loratadine (CLARITIN) 10 MG tablet Take by mouth daily as needed.     [name] MINOXIDIL FOR WOMEN EX Apply topically. Applies to scalp for hairloss    . mirtazapine (REMERON) 15 MG tablet Take 15 mg by mouth as needed. Takes 1/2 tablet at bedtime for itching of scalp     . Multiple Vitamins-Minerals (PRESERVISION AREDS 2 PO) Take 1 tablet by mouth 2 (two) times daily.     . pravastatin (PRAVACHOL) 40 MG tablet TAKE 1 TABLET ONCE DAILY. 90 tablet 1  . Probiotic Product (PROBIOTIC DAILY PO) Take 1 tablet by mouth daily.    . rivaroxaban (XARELTO) 20 MG TABS tablet TAKE 1 TABLET DAILY WITH SUPPER. 90 tablet 2  . telmisartan-hydrochlorothiazide  (MICARDIS HCT) 80-12.5 MG tablet Take 1 tablet by mouth daily. 90 tablet 1  . traMADol (ULTRAM) 50 MG tablet TAKE ONE TABLET EVERY 6 HOURS AS NEEDED. 65 tablet 0  . tretinoin (RETIN-A) 0.05 % cream Once daily     . dicyclomine (BENTYL) 10 MG capsule Take 1 capsule (10 mg total) by mouth 3 (three) times daily before meals. (Patient taking differently: Take 10 mg by mouth 3 (three) times daily before meals. ) 90 capsule 11  . predniSONE (STERAPRED UNI-PAK 21 TAB) 10 MG (21) TBPK tablet Take 6 tabs for 2 days, then 5 for 2 days, then 4 for 2 days, then 3 for 2 days, 2 for 2 days, then 1 for 2 days 42 tablet 0  . vitamin C (ASCORBIC ACID) 500 MG tablet Take 500 mg by mouth daily.     No facility-administered medications prior to visit.     ROS Review of Systems  Constitutional: Positive for fatigue. Negative for appetite change, diaphoresis and unexpected weight change.  HENT: Negative.  Negative for trouble swallowing.   Eyes: Negative for visual disturbance.  Respiratory: Negative for cough, chest tightness and wheezing.   Cardiovascular: Negative for chest pain, palpitations and leg swelling.  Gastrointestinal: Negative for abdominal pain, constipation, diarrhea, nausea and vomiting.  Genitourinary: Positive for frequency. Negative for  difficulty urinating.  Musculoskeletal: Negative.  Negative for arthralgias and myalgias.  Skin: Negative.   Neurological: Negative.  Negative for dizziness, weakness and light-headedness.  Hematological: Negative for adenopathy. Does not bruise/bleed easily.  Psychiatric/Behavioral: Negative.     Objective:  BP 130/78 (BP Location: Left Arm)   Pulse 66   Temp 97.7 F (36.5 C) (Oral)   Wt 147 lb 1.9 oz (66.7 kg)   SpO2 96%   BMI 26.06 kg/m   BP Readings from Last 3 Encounters:  04/20/19 130/78  10/23/18 134/71  09/24/18 106/62    Wt Readings from Last 3 Encounters:  04/20/19 147 lb 1.9 oz (66.7 kg)  10/23/18 155 lb (70.3 kg)  09/24/18 162 lb  (73.5 kg)    Physical Exam Vitals signs reviewed.  Constitutional:      Appearance: Normal appearance.  HENT:     Nose: Nose normal.     Mouth/Throat:     Mouth: Mucous membranes are moist.  Eyes:     Conjunctiva/sclera: Conjunctivae normal.  Neck:     Musculoskeletal: Normal range of motion. No neck rigidity or muscular tenderness.  Cardiovascular:     Rate and Rhythm: Normal rate and regular rhythm.     Heart sounds: No murmur.  Pulmonary:     Effort: Pulmonary effort is normal. No respiratory distress.     Breath sounds: No stridor. No wheezing, rhonchi or rales.  Abdominal:     General: Abdomen is flat. Bowel sounds are normal. There is no distension.     Palpations: There is no hepatomegaly or splenomegaly.     Tenderness: There is no abdominal tenderness.  Musculoskeletal: Normal range of motion.     Right lower leg: No edema.     Left lower leg: No edema.  Lymphadenopathy:     Cervical: No cervical adenopathy.  Skin:    General: Skin is warm and dry.  Neurological:     General: No focal deficit present.     Mental Status: She is alert.  Psychiatric:        Mood and Affect: Mood normal.        Behavior: Behavior normal.     Lab Results  Component Value Date   WBC 5.2 04/01/2019   HGB 13.2 04/01/2019   HCT 38 04/01/2019   PLT 218 04/01/2019   GLUCOSE 93 09/10/2018   CHOL 148 04/20/2019   TRIG 88.0 04/20/2019   HDL 42.50 04/20/2019   LDLDIRECT 142.5 09/30/2012   LDLCALC 88 04/20/2019   ALT 12 04/01/2019   AST 15 04/01/2019   NA 140 04/01/2019   K 3.9 04/01/2019   CL 104 09/10/2018   CREATININE 0.8 04/01/2019   BUN 18 04/01/2019   CO2 30 09/10/2018   TSH 2.91 04/20/2019   HGBA1C 5.8 09/10/2018    Dg Knee Complete 4 Views Left  Result Date: 10/23/2018 CLINICAL DATA:  Acute bilateral knee pain after exercising 5 days ago. EXAM: LEFT KNEE - COMPLETE 4+ VIEW COMPARISON:  Radiographs of Jan 18, 2018. FINDINGS: No evidence of fracture, dislocation, or  joint effusion. Minimal narrowing of medial joint space is noted. Soft tissues are unremarkable. IMPRESSION: Minimal degenerative joint disease is noted medially. No acute abnormality seen in the left knee. Electronically Signed   By: Marijo Conception, M.D.   On: 10/23/2018 11:24   Dg Knee Complete 4 Views Right  Result Date: 10/23/2018 CLINICAL DATA:  Acute bilateral knee pain after exercising 5 days ago. EXAM: RIGHT KNEE -  COMPLETE 4+ VIEW COMPARISON:  Radiographs of Jan 18, 2018. FINDINGS: No evidence of fracture, dislocation, or joint effusion. Mild narrowing of medial joint space is noted. Soft tissues are unremarkable. IMPRESSION: Mild degenerative joint disease is noted medially. No acute abnormality seen in the right knee. Electronically Signed   By: Marijo Conception, M.D.   On: 10/23/2018 11:23    Assessment & Plan:   Colleen White was seen today for hypertension and hyperlipidemia.  Diagnoses and all orders for this visit:  Essential hypertension- Her blood pressure is adequately well controlled. -     Cancel: Basic metabolic panel; Future  Paroxysmal atrial fibrillation ([place])- She is maintaining sinus rhythm.  Will continue anticoagulation with the DOAC. -     TSH; Future  NASH (nonalcoholic steatohepatitis)- Her recent LFTs were normal. -     Cancel: Hepatic function panel; Future  B12 deficiency due to diet-her B12 level is normal now. -     Vitamin B12; Future -     Folate; Future -     Cancel: CBC with Differential/Platelet; Future  HYPERCHOLESTEROLEMIA- She has achieved her L DL goal and is doing well on the statin. -     Lipid panel; Future -     TSH; Future -     Cancel: Hepatic function panel; Future  Vitamin D deficiency- Her vitamin D level is normal now. -     VITAMIN D 25 Hydroxy (Vit-D Deficiency, Fractures); Future  Urinary frequency- She has chronic unchanged microscopic hematuria.  Urine culture is negative.  If she continues to complain of urinary frequency then  I will consider treating her for OAB. -     Urinalysis, Routine w reflex microscopic; Future -     CULTURE, URINE COMPREHENSIVE; Future  Needs flu shot -     Flu Vaccine QUAD High Dose(Fluad)   I have discontinued Pamala Hurry Damiani's vitamin C, dicyclomine, and predniSONE. I am also having her maintain her tretinoin, Multiple Vitamins-Minerals (PRESERVISION AREDS 2 PO), Probiotic Product (PROBIOTIC DAILY PO), Calcium Carbonate-Vitamin D (CALCIUM 500 + D PO), gabapentin, brimonidine-timolol, loratadine, MINOXIDIL FOR WOMEN EX, Vitamin D3, mirtazapine, docusate sodium, esomeprazole, Co Q 10, telmisartan-hydrochlorothiazide, rivaroxaban, pravastatin, traMADol, and diltiazem.  No orders of the defined types were placed in this encounter.    Follow-up: Return in about 6 months (around 10/21/2019).  Scarlette Calico, MD

## 2019-04-20 NOTE — Patient Instructions (Signed)

## 2019-04-21 LAB — VITAMIN D 25 HYDROXY (VIT D DEFICIENCY, FRACTURES): VITD: 36.73 ng/mL (ref 30.00–100.00)

## 2019-04-21 LAB — VITAMIN B12: Vitamin B-12: 394 pg/mL (ref 211–911)

## 2019-04-21 LAB — FOLATE: Folate: >24.7 ng/mL (ref >5.9)

## 2019-04-21 LAB — TSH: TSH: 2.91 u[IU]/mL (ref 0.35–4.50)

## 2019-04-22 LAB — CULTURE, URINE COMPREHENSIVE
MICRO NUMBER:: 803377
RESULT:: NO GROWTH
SPECIMEN QUALITY:: ADEQUATE

## 2019-04-23 ENCOUNTER — Encounter: Payer: Self-pay | Admitting: Internal Medicine

## 2019-05-06 ENCOUNTER — Other Ambulatory Visit: Payer: Self-pay | Admitting: Internal Medicine

## 2019-05-06 DIAGNOSIS — E7800 Pure hypercholesterolemia, unspecified: Secondary | ICD-10-CM

## 2019-05-06 DIAGNOSIS — I10 Essential (primary) hypertension: Secondary | ICD-10-CM

## 2019-06-05 ENCOUNTER — Other Ambulatory Visit: Payer: Self-pay | Admitting: Internal Medicine

## 2019-06-05 DIAGNOSIS — M159 Polyosteoarthritis, unspecified: Secondary | ICD-10-CM

## 2019-06-05 DIAGNOSIS — M8949 Other hypertrophic osteoarthropathy, multiple sites: Secondary | ICD-10-CM

## 2019-06-20 ENCOUNTER — Other Ambulatory Visit (HOSPITAL_COMMUNITY): Payer: Self-pay | Admitting: Cardiology

## 2019-06-23 ENCOUNTER — Telehealth: Payer: Self-pay | Admitting: *Deleted

## 2019-06-23 NOTE — Telephone Encounter (Signed)

## 2019-06-29 NOTE — Progress Notes (Signed)
Virtual Visit via Telephone Note   This visit type was conducted due to national recommendations for restrictions regarding the COVID-19 Pandemic (e.g. social distancing) in an effort to limit this patient's exposure and mitigate transmission in our community.  Due to her co-morbid illnesses, this patient is at least at moderate risk for complications without adequate follow up.  This format is felt to be most appropriate for this patient at this time.  All issues noted in this document were discussed and addressed.  A limited physical exam was performed with this format.  Please refer to the patient's chart for her consent to telehealth for [facility]   Evaluation Performed:  Follow-up visit  This visit type was conducted due to national recommendations for restrictions regarding the COVID-19 Pandemic (e.g. social distancing).  This format is felt to be most appropriate for this patient at this time.  All issues noted in this document were discussed and addressed.  No physical exam was performed (except for noted visual exam findings with Video Visits).  Please refer to the patient's chart (MyChart message for video visits and phone note for telephone visits) for the patient's consent to telehealth for [facility].  Date:  07/01/2019   ID:  Colleen White, DOB November 06, 1945, MRN [id number]  Patient Location:  Home  Provider location:   [place]  PCP:  Janith Lima, MD  Cardiologist:  [name], MD Sleep Medicine:  Fransico Him, MD Electrophysiologist:  None   Chief Complaint:  OSA  History of Present Illness:    Colleen White is a 73 y.o. female who presents via audio/video conferencing for a telehealth visit today.    Colleen White is a 73 y.o. female with a hx of mild OSA with an AHI of 9.1/hrand is onCPAPat7cm H2O. She is doing well with her CPAP device and thinks that she has gotten used to it.  She tolerates the nasal pillow mask and feels the pressure  is adequate.  Since going on CPAP she feels rested in the am and has no significant daytime sleepiness.  She denies any significant mouth or nasal dryness or nasal congestion.  She does not think that he snores.    The patient does not have symptoms concerning for COVID-19 infection (fever, chills, cough, or new shortness of breath).   Prior CV studies:   The following studies were reviewed today:  PAP compliance download  Past Medical History:  Diagnosis Date  . Allergic rhinitis, cause unspecified   . Alopecia   . Anxiety   . Colon polyps   . Diverticulosis of colon (without mention of hemorrhage)   . Fibrocystic breast disease   . GERD (gastroesophageal reflux disease)   . Headache(784.0)   . Hemorrhoids   . History of echocardiogram    Echo 3/17:  Vigorous LVF, EF 65-70%, normal wall motion, grade 1 diastolic, mildly elevated LVOT velocity of 2.2 m/s-likely related to vigorous LVF  . History of stress test    Myoview normal in 2/05. //  Stress echo (11/15) with no evidence for ischemia or infarction.  [name] HLD (hyperlipidemia)   . HTN (hypertension)   . Hypertension   . Mitral valve disorders(424.0)   . Non Hodgkin's lymphoma ([place])    Treated at [facility] with surgery and XRT.  Did not have chemotherapy.   . OSA (obstructive sleep apnea) 03/29/2016  . Osteopenia   . PAF (paroxysmal atrial fibrillation) ([place])    a. CHADS2-VASc=3 //  b. Xarelto started in 2017  .  Pulmonary nodule    Presumed benign after observation.   . Sleep apnea   . Unspecified glaucoma(365.9)    Past Surgical History:  Procedure Laterality Date  . ABDOMINAL HYSTERECTOMY  1996  . BASAL CELL CARCINOMA EXCISION     06/2017  . BREAST CYST ASPIRATION  2017  . EYE SURGERY  2009   Cataract  . 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 Cloyd Stagers, [facility]  . s/p ORIF prox phalanx  02/2007   right little finger----Dr Fredna Dow  . s/p TAH/BSO       Current Meds  Medication Sig  . Ascorbic Acid  (VITAMIN C PO) Take 1 tablet by mouth daily.  . brimonidine-timolol (COMBIGAN) 0.2-0.5 % ophthalmic solution Combigan 0.2 %-0.5 % eye drops  . Calcium Carbonate-Vitamin D (CALCIUM 500 + D PO) Take 1 tablet by mouth 2 (two) times daily.  . Cholecalciferol (VITAMIN D3) 1000 units CAPS Take by mouth.  . Coenzyme Q10 (CO Q 10) 100 MG CAPS Take 1 tablet by mouth daily.  [name] diltiazem (CARDIZEM CD) 120 MG 24 hr capsule TAKE (1) CAPSULE DAILY.  [name] docusate sodium (COLACE) 100 MG capsule Take 100 mg by mouth at bedtime.  [name] esomeprazole (NEXIUM) 20 MG packet Take 20 mg by mouth daily before breakfast.  . gabapentin (NEURONTIN) 100 MG capsule Take 1 capsule (100 mg total) daily by mouth. (Patient taking differently: Take 400 mg by mouth as needed. )  . loratadine (CLARITIN) 10 MG tablet Take by mouth daily as needed.   [name] MINOXIDIL FOR WOMEN EX Apply topically. Applies to scalp for hairloss  . mirtazapine (REMERON) 15 MG tablet Take 15 mg by mouth as needed. Takes 1/2 tablet at bedtime for itching of scalp   . Multiple Vitamins-Minerals (PRESERVISION AREDS 2 PO) Take 1 tablet by mouth 2 (two) times daily.   . pravastatin (PRAVACHOL) 40 MG tablet TAKE 1 TABLET ONCE DAILY.  . Probiotic Product (PROBIOTIC DAILY PO) Take 1 tablet by mouth daily.  [name] telmisartan-hydrochlorothiazide (MICARDIS HCT) 80-12.5 MG tablet TAKE 1 TABLET ONCE DAILY.  . traMADol (ULTRAM) 50 MG tablet TAKE ONE TABLET EVERY 6 HOURS AS NEEDED.  [name] tretinoin (RETIN-A) 0.05 % cream Once daily   . XARELTO 20 MG TABS tablet TAKE 1 TABLET DAILY WITH SUPPER.     Allergies:   Patient has no known allergies.   Social History   Tobacco Use  . Smoking status: Former Smoker    Quit date: 08/27/1974    Years since quitting: 44.8  . Smokeless tobacco: Never Used  Substance Use Topics  . Alcohol use: No    Comment: occasionally  . Drug use: No     Family Hx: The patient's family history includes Breast cancer in her mother; Cancer in her father  and sister; Colon cancer in her maternal uncle; Diabetes in her father; Heart disease in her father; Hypertension in her mother; Prostate cancer in her father.  ROS:   Please see the history of present illness.     All other systems reviewed and are negative.   Labs/Other Tests and Data Reviewed:    Recent Labs: 04/01/2019: ALT 12; BUN 18; Creatinine 0.8; Hemoglobin 13.2; Platelets 218; Potassium 3.9; Sodium 140 04/20/2019: TSH 2.91   Recent Lipid Panel Lab Results  Component Value Date/Time   CHOL 148 04/20/2019 09:04 AM   TRIG 88.0 04/20/2019 09:04 AM   HDL 42.50 04/20/2019 09:04 AM   CHOLHDL 3 04/20/2019 09:04 AM  LDLCALC 88 04/20/2019 09:04 AM   LDLDIRECT 142.5 09/30/2012 11:10 AM    Wt Readings from Last 3 Encounters:  07/01/19 145 lb (65.8 kg)  04/20/19 147 lb 1.9 oz (66.7 kg)  10/23/18 155 lb (70.3 kg)     Objective:    Vital Signs:  BP 123/64   Pulse 64   Ht 5' 3"  (1.6 m)   Wt 145 lb (65.8 kg)   BMI 25.69 kg/m     ASSESSMENT & PLAN:    1.  OSA -  The patient is tolerating PAP therapy well without any problems. The PAP download was reviewed today and showed an AHI of 2.2/hr on 7 cm H2O with 97% compliance in using more than 4 hours nightly.  The patient has been using and benefiting from PAP use and will continue to benefit from therapy.   2.  HTN -BP controlled -continue Cardizem CD 171m daily, Telmisartan-HCT 80-12.526mdaily  3.  PAF -she has not had any palpitations -no bleeding on DOA -continue Cardizem and Xarelto 2026maily   COVID-19 Education: The signs and symptoms of COVID-19 were discussed with the patient and how to seek care for testing (follow up with PCP or arrange E-visit).  The importance of social distancing was discussed today.  Patient Risk:   After full review of this patient's clinical status, I feel that they are at least moderate risk at this time.  Time:   Today, I have spent 20 minutes directly with the patient on  telemedicine discussing medical problems including OSA, PAF.  We also reviewed the symptoms of COVID 19 and the ways to protect against contracting the virus with telehealth technology.  I spent an additional 5 minutes reviewing patient's chart including PAP compliance download.  Medication Adjustments/Labs and Tests Ordered: Current medicines are reviewed at length with the patient today.  Concerns regarding medicines are outlined above.  Tests Ordered: No orders of the defined types were placed in this encounter.  Medication Changes: No orders of the defined types were placed in this encounter.   Disposition:  Follow up in 1 year(s)  Signed, TraFransico HimD  07/01/2019 8:27 AM    [facility]

## 2019-06-30 ENCOUNTER — Telehealth: Payer: Self-pay

## 2019-06-30 NOTE — Telephone Encounter (Signed)
Spoke with pt and went over medications. Pt verbalized she was taking all meds listed and thanked me for the call.

## 2019-07-01 ENCOUNTER — Other Ambulatory Visit: Payer: Self-pay

## 2019-07-01 ENCOUNTER — Encounter: Payer: Self-pay | Admitting: Cardiology

## 2019-07-01 ENCOUNTER — Telehealth (INDEPENDENT_AMBULATORY_CARE_PROVIDER_SITE_OTHER): Payer: Medicare Other | Admitting: Cardiology

## 2019-07-01 VITALS — BP 123/64 | HR 64 | Ht 63.0 in | Wt 145.0 lb

## 2019-07-01 DIAGNOSIS — G4733 Obstructive sleep apnea (adult) (pediatric): Secondary | ICD-10-CM

## 2019-07-01 DIAGNOSIS — I480 Paroxysmal atrial fibrillation: Secondary | ICD-10-CM

## 2019-07-01 DIAGNOSIS — I10 Essential (primary) hypertension: Secondary | ICD-10-CM

## 2019-07-09 DIAGNOSIS — H401131 Primary open-angle glaucoma, bilateral, mild stage: Secondary | ICD-10-CM | POA: Diagnosis not present

## 2019-07-09 DIAGNOSIS — H02403 Unspecified ptosis of bilateral eyelids: Secondary | ICD-10-CM | POA: Diagnosis not present

## 2019-07-09 DIAGNOSIS — H04123 Dry eye syndrome of bilateral lacrimal glands: Secondary | ICD-10-CM | POA: Diagnosis not present

## 2019-07-09 DIAGNOSIS — H16103 Unspecified superficial keratitis, bilateral: Secondary | ICD-10-CM | POA: Diagnosis not present

## 2019-07-17 ENCOUNTER — Encounter (HOSPITAL_COMMUNITY): Payer: Self-pay | Admitting: Cardiology

## 2019-07-17 ENCOUNTER — Ambulatory Visit (HOSPITAL_COMMUNITY)
Admission: RE | Admit: 2019-07-17 | Discharge: 2019-07-17 | Disposition: A | Discharge status: Home / Self Care | Payer: Medicare Other | Source: Ambulatory Visit | Attending: Cardiology | Admitting: Cardiology

## 2019-07-17 ENCOUNTER — Other Ambulatory Visit: Payer: Self-pay

## 2019-07-17 VITALS — BP 118/70 | HR 60 | Wt 150.4 lb

## 2019-07-17 DIAGNOSIS — Z7901 Long term (current) use of anticoagulants: Secondary | ICD-10-CM | POA: Insufficient documentation

## 2019-07-17 DIAGNOSIS — E7800 Pure hypercholesterolemia, unspecified: Secondary | ICD-10-CM | POA: Diagnosis not present

## 2019-07-17 DIAGNOSIS — I480 Paroxysmal atrial fibrillation: Secondary | ICD-10-CM

## 2019-07-17 DIAGNOSIS — Z79899 Other long term (current) drug therapy: Secondary | ICD-10-CM | POA: Diagnosis not present

## 2019-07-17 DIAGNOSIS — I10 Essential (primary) hypertension: Secondary | ICD-10-CM | POA: Diagnosis not present

## 2019-07-17 DIAGNOSIS — E785 Hyperlipidemia, unspecified: Secondary | ICD-10-CM | POA: Diagnosis not present

## 2019-07-17 DIAGNOSIS — Z8572 Personal history of non-Hodgkin lymphomas: Secondary | ICD-10-CM | POA: Insufficient documentation

## 2019-07-17 DIAGNOSIS — K219 Gastro-esophageal reflux disease without esophagitis: Secondary | ICD-10-CM | POA: Insufficient documentation

## 2019-07-17 DIAGNOSIS — G4733 Obstructive sleep apnea (adult) (pediatric): Secondary | ICD-10-CM | POA: Diagnosis not present

## 2019-07-17 NOTE — Patient Instructions (Signed)
Follow up in 1 year. Please call our office at [phone] in October 2021 to schedule your November 2021 office visit.

## 2019-07-19 NOTE — Progress Notes (Signed)
Patient ID: Colleen White, female   DOB: 1946/02/02, 73 y.o.   MRN: [id number] PCP: Dr. Ronnald Ramp Cardiology: Dr. Aundra Dubin  73 y.o. with history of HTN, hyperlipidemia, paroxysmal atrial fibrillation and prior non-Hodgkins lymphoma presents for followup of atrial fibrillation.  She called the office in 1/17 to report increased episodes of palpitations.  She had had rare tachypalpitations for years, but in 1/17 she had 2 episodes of tachypalpitations close together.  I had her wear an event monitor.  By monitor, her tachypalpitations correlated with atrial fibrillation with RVR.  She was started on Xarelto and Toprol XL.  Toprol XL caused swelling, so it was stopped and diltiazem CD was begun.  Echo in 3/17 showed EF 65-70% with mildly elevated LVOT velocity due to vigorous contraction.  She has been doing well recently.  Using CPAP.  No recent palpitations. She is in NSR today.  No significant exertional dyspnea.  She walks for exercise.  No chest pain.  No BRBPR/melena.  Weight down 13 lbs.     ECG: NSR, normal (personally reviewed)  Labs (4/15): K 3.7, creatinine 0.6, LDL 111, HDL 46 Labs (4/16): LDL 89 Labs (3/17): K 3.8, creatinine 0.7, HCT 37.3 Labs (4/17): LDL 93, HDL 46 Labs (8/17): hgb 13.1, K 3.4, creatinine 0.7 Labs (4/18): LDL 99, hgb 13.7 Labs (10/18): K 3.7, creatinine 0.75 Labs (6/19): LDL 97 Labs (8/19): K 3.9, creatinine 0.84 Labs (8/20): K 3.9, creatinine 0.8, LDL 88  PMH: 1. Echo (11/15): EF 60-65%, trivial MR. Echo (3/17) with EF 65-70%, mildly elevated LVOT velocity due to vigorous contraction. 2. Chest pain: Myoview normal in 2/05. Stress echo (11/15) with no evidence for ischemia or infarction.  3. Suspected glaucoma 4. HTN 5. Hyperlipidemia 6. GERD 7. Diverticulosis 8. TAH/BSO 9. Non-Hodgkins lymphoma: In remission.  Treated at [facility] with surgery and XRT.  Did not have chemotherapy.  10. Pulmonary nodules: Presumed benign after observation.  11. Atrial fibrillation:  Paroxysmal.  12. OSA: Uses CPAP.   SH: Married, cares for elderly mother, nonsmoker.  Lives in [place].  FH: Mother with atrial fibrillation and CAD.   ROS: All systems reviewed and negative except as per HPI.   Current Outpatient Medications  Medication Sig Dispense Refill  . Ascorbic Acid (VITAMIN C PO) Take 1 tablet by mouth daily.    . brimonidine-timolol (COMBIGAN) 0.2-0.5 % ophthalmic solution Combigan 0.2 %-0.5 % eye drops    . Calcium Carbonate-Vitamin D (CALCIUM 500 + D PO) Take 1 tablet by mouth 2 (two) times daily.    . Cholecalciferol (VITAMIN D3) 1000 units CAPS Take by mouth.    . Coenzyme Q10 (CO Q 10) 100 MG CAPS Take 1 tablet by mouth daily.    [name] diltiazem (CARDIZEM CD) 120 MG 24 hr capsule TAKE (1) CAPSULE DAILY. 90 capsule 3  . docusate sodium (COLACE) 100 MG capsule Take 100 mg by mouth at bedtime.    [name] esomeprazole (NEXIUM) 20 MG packet Take 20 mg by mouth daily before breakfast.    . gabapentin (NEURONTIN) 100 MG capsule Take 1 capsule (100 mg total) daily by mouth. (Patient taking differently: Take 400 mg by mouth as needed. ) 90 capsule 0  . loratadine (CLARITIN) 10 MG tablet Take by mouth daily as needed.     [name] MINOXIDIL FOR WOMEN EX Apply topically. Applies to scalp for hairloss    . mirtazapine (REMERON) 15 MG tablet Take 15 mg by mouth as needed. Takes 1/2 tablet at bedtime for itching of scalp     .  Multiple Vitamins-Minerals (PRESERVISION AREDS 2 PO) Take 1 tablet by mouth 2 (two) times daily.     . pravastatin (PRAVACHOL) 40 MG tablet TAKE 1 TABLET ONCE DAILY. 90 tablet 1  . Probiotic Product (PROBIOTIC DAILY PO) Take 1 tablet by mouth daily.    [name] telmisartan-hydrochlorothiazide (MICARDIS HCT) 80-12.5 MG tablet TAKE 1 TABLET ONCE DAILY. 90 tablet 1  . traMADol (ULTRAM) 50 MG tablet TAKE ONE TABLET EVERY 6 HOURS AS NEEDED. 65 tablet 3  . tretinoin (RETIN-A) 0.05 % cream Once daily     . XARELTO 20 MG TABS tablet TAKE 1 TABLET DAILY WITH SUPPER. 30 tablet  0   No current facility-administered medications for this encounter.     BP 118/70   Pulse 60   Wt 68.2 kg (150 lb 6.4 oz)   SpO2 97%   BMI 26.64 kg/m  General: NAD Neck: No JVD, no thyromegaly or thyroid nodule.  Lungs: Clear to auscultation bilaterally with normal respiratory effort. CV: Nondisplaced PMI.  Heart regular S1/S2, no S3/S4, no murmur.  No peripheral edema.  No carotid bruit.  Normal pedal pulses.  Abdomen: Soft, nontender, no hepatosplenomegaly, no distention.  Skin: Intact without lesions or rashes.  Neurologic: Alert and oriented x 3.  Psych: Normal affect. Extremities: No clubbing or cyanosis.  HEENT: Normal.   Assessment/Plan: 1. HTN: BP seems relatively well-controlled, no changes.  2. Hyperlipidemia: Lipids ok in 8/20. 3. Atrial fibrillation: Paroxysmal.  First noted in 1/17.  No symptomatic recurrences recently.   CHADSVASC = 3.  Best route for prevention will be to continue to control OSA and HTN.  - Continue diltiazem CD 120 daily.  - Continue Xarelto 20 daily. Stable CBC and BMET in 8/20. - She is now using CPAP. 4. OSA: Using CPAP.   Followup in 1 year.   [name] 07/19/2019

## 2019-07-20 DIAGNOSIS — H1131 Conjunctival hemorrhage, right eye: Secondary | ICD-10-CM | POA: Diagnosis not present

## 2019-08-03 DIAGNOSIS — N6011 Diffuse cystic mastopathy of right breast: Secondary | ICD-10-CM | POA: Diagnosis not present

## 2019-08-03 DIAGNOSIS — Z9189 Other specified personal risk factors, not elsewhere classified: Secondary | ICD-10-CM | POA: Diagnosis not present

## 2019-08-03 DIAGNOSIS — N6012 Diffuse cystic mastopathy of left breast: Secondary | ICD-10-CM | POA: Diagnosis not present

## 2019-08-03 LAB — HM MAMMOGRAPHY

## 2019-08-06 DIAGNOSIS — R928 Other abnormal and inconclusive findings on diagnostic imaging of breast: Secondary | ICD-10-CM | POA: Diagnosis not present

## 2019-08-06 DIAGNOSIS — Z6827 Body mass index (BMI) 27.0-27.9, adult: Secondary | ICD-10-CM | POA: Diagnosis not present

## 2019-08-24 ENCOUNTER — Other Ambulatory Visit (HOSPITAL_COMMUNITY): Payer: Self-pay | Admitting: Cardiology

## 2019-10-21 ENCOUNTER — Ambulatory Visit: Payer: Medicare Other | Admitting: Internal Medicine

## 2019-10-27 ENCOUNTER — Telehealth: Payer: Self-pay | Admitting: Internal Medicine

## 2019-10-27 NOTE — Progress Notes (Signed)
°  Chronic Care Management   Outreach Note  10/27/2019 Name: Colleen White MRN: [id number] DOB: 1945/11/29  Referred by: Janith Lima, MD Reason for referral : No chief complaint on file.   An unsuccessful telephone outreach was attempted today. The patient was referred to the pharmacist for assistance with care management and care coordination.   Follow Up Plan:   Colleen White [employer] Scheduler

## 2019-10-27 NOTE — Chronic Care Management (AMB) (Signed)
  Chronic Care Management   Note  10/27/2019 Name: [name] MRN: [id number] DOB: 1946/02/19  Colleen White is a 74 y.o. year old female who is a primary care patient of Janith Lima, MD. I reached out to [name] by phone today in response to a referral sent by [name] PCP, Janith Lima, MD.   [name] was given information about Chronic Care Management services today including:  1. CCM service includes personalized support from designated clinical staff supervised by her physician, including individualized plan of care and coordination with other care providers 2. 24/7 contact phone numbers for assistance for urgent and routine care needs. 3. Service will only be billed when office clinical staff spend 20 minutes or more in a month to coordinate care. 4. Only one practitioner may furnish and bill the service in a calendar month. 5. The patient may stop CCM services at any time (effective at the end of the month) by phone call to the office staff. 6. The patient will be responsible for cost sharing (co-pay) of up to 20% of the service fee (after annual deductible is met).  Patient agreed to services and verbal consent obtained.   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

## 2019-10-29 DIAGNOSIS — D229 Melanocytic nevi, unspecified: Secondary | ICD-10-CM | POA: Diagnosis not present

## 2019-10-29 DIAGNOSIS — L814 Other melanin hyperpigmentation: Secondary | ICD-10-CM | POA: Diagnosis not present

## 2019-10-29 DIAGNOSIS — L821 Other seborrheic keratosis: Secondary | ICD-10-CM | POA: Diagnosis not present

## 2019-10-29 DIAGNOSIS — Z79899 Other long term (current) drug therapy: Secondary | ICD-10-CM | POA: Diagnosis not present

## 2019-10-29 DIAGNOSIS — L658 Other specified nonscarring hair loss: Secondary | ICD-10-CM | POA: Diagnosis not present

## 2019-10-29 DIAGNOSIS — L720 Epidermal cyst: Secondary | ICD-10-CM | POA: Diagnosis not present

## 2019-10-29 DIAGNOSIS — R208 Other disturbances of skin sensation: Secondary | ICD-10-CM | POA: Diagnosis not present

## 2019-11-02 DIAGNOSIS — R208 Other disturbances of skin sensation: Secondary | ICD-10-CM | POA: Insufficient documentation

## 2019-11-02 DIAGNOSIS — D229 Melanocytic nevi, unspecified: Secondary | ICD-10-CM | POA: Insufficient documentation

## 2019-11-03 ENCOUNTER — Other Ambulatory Visit (INDEPENDENT_AMBULATORY_CARE_PROVIDER_SITE_OTHER): Payer: Medicare Other

## 2019-11-03 ENCOUNTER — Ambulatory Visit (INDEPENDENT_AMBULATORY_CARE_PROVIDER_SITE_OTHER): Payer: Medicare Other | Admitting: Internal Medicine

## 2019-11-03 ENCOUNTER — Other Ambulatory Visit: Payer: Self-pay

## 2019-11-03 ENCOUNTER — Encounter: Payer: Self-pay | Admitting: Internal Medicine

## 2019-11-03 VITALS — BP 126/78 | HR 62 | Temp 98.1°F | Resp 16 | Ht 63.0 in | Wt 151.5 lb

## 2019-11-03 DIAGNOSIS — E876 Hypokalemia: Secondary | ICD-10-CM

## 2019-11-03 DIAGNOSIS — T502X5A Adverse effect of carbonic-anhydrase inhibitors, benzothiadiazides and other diuretics, initial encounter: Secondary | ICD-10-CM

## 2019-11-03 DIAGNOSIS — E559 Vitamin D deficiency, unspecified: Secondary | ICD-10-CM | POA: Diagnosis not present

## 2019-11-03 DIAGNOSIS — E7800 Pure hypercholesterolemia, unspecified: Secondary | ICD-10-CM

## 2019-11-03 DIAGNOSIS — E538 Deficiency of other specified B group vitamins: Secondary | ICD-10-CM | POA: Diagnosis not present

## 2019-11-03 DIAGNOSIS — I10 Essential (primary) hypertension: Secondary | ICD-10-CM

## 2019-11-03 DIAGNOSIS — K7581 Nonalcoholic steatohepatitis (NASH): Secondary | ICD-10-CM

## 2019-11-03 DIAGNOSIS — Z0000 Encounter for general adult medical examination without abnormal findings: Secondary | ICD-10-CM | POA: Diagnosis not present

## 2019-11-03 DIAGNOSIS — I480 Paroxysmal atrial fibrillation: Secondary | ICD-10-CM | POA: Diagnosis not present

## 2019-11-03 LAB — CBC WITH DIFFERENTIAL/PLATELET
Basophils Absolute: 0 10*3/uL (ref 0.0–0.1)
Basophils Relative: 0.6 % (ref 0.0–3.0)
Eosinophils Absolute: 0 10*3/uL (ref 0.0–0.7)
Eosinophils Relative: 0.9 % (ref 0.0–5.0)
HCT: 39.9 % (ref 36.0–46.0)
Hemoglobin: 13.6 g/dL (ref 12.0–15.0)
Lymphocytes Relative: 26.6 % (ref 12.0–46.0)
Lymphs Abs: 1.4 10*3/uL (ref 0.7–4.0)
MCHC: 34.2 g/dL (ref 30.0–36.0)
MCV: 92.3 fl (ref 78.0–100.0)
Monocytes Absolute: 0.4 10*3/uL (ref 0.1–1.0)
Monocytes Relative: 6.9 % (ref 3.0–12.0)
Neutro Abs: 3.5 10*3/uL (ref 1.4–7.7)
Neutrophils Relative %: 65 % (ref 43.0–77.0)
Platelets: 203 10*3/uL (ref 150.0–400.0)
RBC: 4.33 Mil/uL (ref 3.87–5.11)
RDW: 12.7 % (ref 11.5–15.5)
WBC: 5.4 10*3/uL (ref 4.0–10.5)

## 2019-11-03 LAB — BASIC METABOLIC PANEL
BUN: 11 mg/dL (ref 6–23)
CO2: 31 mEq/L (ref 19–32)
Calcium: 9.7 mg/dL (ref 8.4–10.5)
Chloride: 100 mEq/L (ref 96–112)
Creatinine, Ser: 0.75 mg/dL (ref 0.40–1.20)
GFR: 75.33 mL/min (ref >60.00)
Glucose, Bld: 86 mg/dL (ref 70–99)
Potassium: 3.2 mEq/L — ABNORMAL LOW (ref 3.5–5.1)
Sodium: 138 mEq/L (ref 135–145)

## 2019-11-03 LAB — LIPID PANEL
Cholesterol: 138 mg/dL (ref 0–200)
HDL: 47 mg/dL (ref >39.00)
LDL Cholesterol: 73 mg/dL (ref 0–99)
NonHDL: 90.65
Total CHOL/HDL Ratio: 3
Triglycerides: 87 mg/dL (ref 0.0–149.0)
VLDL: 17.4 mg/dL (ref 0.0–40.0)

## 2019-11-03 LAB — HEPATIC FUNCTION PANEL
ALT: 21 U/L (ref 0–35)
AST: 23 U/L (ref 0–37)
Albumin: 4.5 g/dL (ref 3.5–5.2)
Alkaline Phosphatase: 94 U/L (ref 39–117)
Bilirubin, Direct: 0.2 mg/dL (ref 0.0–0.3)
Total Bilirubin: 0.8 mg/dL (ref 0.2–1.2)
Total Protein: 7.5 g/dL (ref 6.0–8.3)

## 2019-11-03 LAB — PROTIME-INR
INR: 1.8 ratio — ABNORMAL HIGH (ref 0.8–1.0)
Prothrombin Time: 20 s — ABNORMAL HIGH (ref 9.6–13.1)

## 2019-11-03 LAB — VITAMIN B12: Vitamin B-12: 515 pg/mL (ref 211–911)

## 2019-11-03 LAB — TSH: TSH: 2.14 u[IU]/mL (ref 0.35–4.50)

## 2019-11-03 LAB — FOLATE: Folate: >23.7 ng/mL (ref >5.9)

## 2019-11-03 LAB — VITAMIN D 25 HYDROXY (VIT D DEFICIENCY, FRACTURES): VITD: 45.29 ng/mL (ref 30.00–100.00)

## 2019-11-03 NOTE — Progress Notes (Signed)
Subjective:  Patient ID: Colleen White, female    DOB: 04-Dec-1945  Age: 74 y.o. MRN: [id number]  CC: Annual Exam, Hypertension, Hyperlipidemia, and Atrial Fibrillation   HPI Eldoris Beiser presents for a CPX.  She is active and denies any recent episodes of palpitations, DOE, chest pain, shortness of breath, edema, diaphoresis, dizziness, lightheadedness, or fatigue.  Outpatient Medications Prior to Visit  Medication Sig Dispense Refill  . Ascorbic Acid (VITAMIN C PO) Take 500 mg by mouth daily.     . brimonidine-timolol (COMBIGAN) 0.2-0.5 % ophthalmic solution Combigan 0.2 %-0.5 % eye drops    . Calcium Carbonate-Vitamin D (CALCIUM 500 + D PO) Take 1 tablet by mouth 2 (two) times daily.    . Cholecalciferol (VITAMIN D3) 1000 units CAPS Take 3,000 Units by mouth.     . Coenzyme Q10 (CO Q 10) 100 MG CAPS Take 1 tablet by mouth daily.    [name] diltiazem (CARDIZEM CD) 120 MG 24 hr capsule TAKE (1) CAPSULE DAILY. 90 capsule 3  . docusate sodium (COLACE) 100 MG capsule Take 100 mg by mouth at bedtime.    [name] esomeprazole (NEXIUM) 20 MG packet Take 20 mg by mouth daily before breakfast.    . gabapentin (NEURONTIN) 100 MG capsule Take 1 capsule (100 mg total) daily by mouth. (Patient taking differently: Take 300 mg by mouth at bedtime. ) 90 capsule 0  . loratadine (CLARITIN) 10 MG tablet Take by mouth daily as needed.     [name] MINOXIDIL FOR WOMEN EX Apply topically. Applies to scalp for hairloss    . mirtazapine (REMERON) 15 MG tablet Take 7.5 mg by mouth at bedtime. Takes 1/2 tablet at bedtime for itching of scalp     . Multiple Vitamins-Minerals (PRESERVISION AREDS 2 PO) Take 1 tablet by mouth 2 (two) times daily.     . pravastatin (PRAVACHOL) 40 MG tablet TAKE 1 TABLET ONCE DAILY. 90 tablet 1  . Probiotic Product (PROBIOTIC DAILY PO) Take 1 tablet by mouth daily.    [name] telmisartan-hydrochlorothiazide (MICARDIS HCT) 80-12.5 MG tablet TAKE 1 TABLET ONCE DAILY. 90 tablet 1  . traMADol (ULTRAM) 50  MG tablet TAKE ONE TABLET EVERY 6 HOURS AS NEEDED. 65 tablet 3  . tretinoin (RETIN-A) 0.05 % cream Once daily     . XARELTO 20 MG TABS tablet TAKE 1 TABLET DAILY WITH SUPPER. 30 tablet 5   No facility-administered medications prior to visit.    ROS Review of Systems  Constitutional: Negative.  Negative for chills, diaphoresis, fatigue and fever.  HENT: Negative.   Eyes: Negative for visual disturbance.  Respiratory: Negative for cough, chest tightness, shortness of breath and wheezing.   Cardiovascular: Negative for chest pain, palpitations and leg swelling.  Gastrointestinal: Negative for abdominal pain, diarrhea, nausea and vomiting.  Endocrine: Negative.   Genitourinary: Negative.  Negative for difficulty urinating.  Musculoskeletal: Negative.  Negative for arthralgias and myalgias.  Skin: Negative.  Negative for color change and rash.    Objective:  BP 126/78 (BP Location: Left Arm, Patient Position: Sitting, Cuff Size: Normal)   Pulse 62   Temp 98.1 F (36.7 C) (Oral)   Resp 16   Ht 5' 3"  (1.6 m)   Wt 151 lb 8 oz (68.7 kg)   SpO2 99%   BMI 26.84 kg/m   BP Readings from Last 3 Encounters:  11/03/19 126/78  07/17/19 118/70  07/01/19 123/64    Wt Readings from Last 3 Encounters:  11/03/19 151 lb 8 oz (68.7  kg)  07/17/19 150 lb 6.4 oz (68.2 kg)  07/01/19 145 lb (65.8 kg)    Physical Exam Vitals reviewed.  Constitutional:      Appearance: Normal appearance.  HENT:     Nose: Nose normal.     Mouth/Throat:     Mouth: Mucous membranes are moist.  Eyes:     General: No scleral icterus.    Conjunctiva/sclera: Conjunctivae normal.  Cardiovascular:     Rate and Rhythm: Normal rate and regular rhythm.     Heart sounds: No murmur.  Pulmonary:     Effort: Pulmonary effort is normal.     Breath sounds: No stridor. No wheezing, rhonchi or rales.  Abdominal:     General: Abdomen is flat. Bowel sounds are normal. There is no distension.     Palpations: Abdomen is  soft. There is no hepatomegaly, splenomegaly or mass.     Tenderness: There is no abdominal tenderness.     Hernia: No hernia is present.  Musculoskeletal:        General: Normal range of motion.     Cervical back: Neck supple.     Right lower leg: No edema.     Left lower leg: No edema.  Lymphadenopathy:     Cervical: No cervical adenopathy.  Skin:    General: Skin is warm and dry.     Coloration: Skin is not pale.     Findings: No erythema or rash.  Neurological:     General: No focal deficit present.     Mental Status: She is alert.  Psychiatric:        Mood and Affect: Mood normal.        Behavior: Behavior normal.     Lab Results  Component Value Date   WBC 5.4 11/03/2019   HGB 13.6 11/03/2019   HCT 39.9 11/03/2019   PLT 203.0 11/03/2019   GLUCOSE 86 11/03/2019   CHOL 138 11/03/2019   TRIG 87.0 11/03/2019   HDL 47.00 11/03/2019   LDLDIRECT 142.5 09/30/2012   LDLCALC 73 11/03/2019   ALT 21 11/03/2019   AST 23 11/03/2019   NA 138 11/03/2019   K 3.2 (L) 11/03/2019   CL 100 11/03/2019   CREATININE 0.75 11/03/2019   BUN 11 11/03/2019   CO2 31 11/03/2019   TSH 2.14 11/03/2019   INR 1.8 (H) 11/03/2019   HGBA1C 5.8 09/10/2018    No results found.  Assessment & Plan:   Magdala was seen today for annual exam, hypertension, hyperlipidemia and atrial fibrillation.  Diagnoses and all orders for this visit:  Essential hypertension- Her blood pressure is adequately well controlled.  She has developed hypokalemia as a result of the thiazide diuretic.  I have asked her to start taking a potassium supplement. -     Basic metabolic panel; Future -     TSH; Future  Paroxysmal atrial fibrillation (HCC) - She is maintaining excellent rate and rhythm control.  Will continue anticoagulation with the DOAC.  NASH (nonalcoholic steatohepatitis)- Her LFTs are normal now.  She has had marked improvement with lifestyle modifications.  Medical therapy is not indicated. -      Hepatic function panel; Future -     Protime-INR; Future  B12 deficiency due to diet- Her H&H, B12, and folate levels are normal. -     CBC with Differential/Platelet; Future -     Vitamin B12; Future -     Folate; Future  HYPERCHOLESTEROLEMIA- She has achieved her LDL goal and  is doing well on the statin. -     Lipid panel; Future -     TSH; Future  Vitamin D deficiency- Her Vit D level is normal now. -     VITAMIN D 25 Hydroxy (Vit-D Deficiency, Fractures); Future  Routine general medical examination at a health care facility- Exam completed, labs reviewed, vaccines are all up-to-date, cancer screenings are all up-to-date, patient education material was given.  Diuretic-induced hypokalemia -     potassium chloride SA (KLOR-CON) 20 MEQ tablet; Take 1 tablet (20 mEq total) by mouth 2 (two) times daily.   I am having [name] start on potassium chloride SA. I am also having her maintain her tretinoin, Multiple Vitamins-Minerals (PRESERVISION AREDS 2 PO), Probiotic Product (PROBIOTIC DAILY PO), Calcium Carbonate-Vitamin D (CALCIUM 500 + D PO), gabapentin, brimonidine-timolol, loratadine, MINOXIDIL FOR WOMEN EX, Vitamin D3, mirtazapine, docusate sodium, esomeprazole, Co Q 10, diltiazem, pravastatin, telmisartan-hydrochlorothiazide, traMADol, Ascorbic Acid (VITAMIN C PO), and Xarelto.  Meds ordered this encounter  Medications  . potassium chloride SA (KLOR-CON) 20 MEQ tablet    Sig: Take 1 tablet (20 mEq total) by mouth 2 (two) times daily.    Dispense:  180 tablet    Refill:  0     Follow-up: No follow-ups on file.  Scarlette Calico, MD

## 2019-11-04 ENCOUNTER — Encounter: Payer: Self-pay | Admitting: Internal Medicine

## 2019-11-04 DIAGNOSIS — T502X5A Adverse effect of carbonic-anhydrase inhibitors, benzothiadiazides and other diuretics, initial encounter: Secondary | ICD-10-CM | POA: Insufficient documentation

## 2019-11-04 DIAGNOSIS — E876 Hypokalemia: Secondary | ICD-10-CM | POA: Insufficient documentation

## 2019-11-04 MED ORDER — POTASSIUM CHLORIDE CRYS ER 20 MEQ PO TBCR: 20.0000 meq | EXTENDED_RELEASE_TABLET | Freq: Two times a day (BID) | ORAL | 0 refills | Status: DC

## 2019-11-06 ENCOUNTER — Encounter: Payer: Self-pay | Admitting: Internal Medicine

## 2019-11-06 NOTE — Patient Instructions (Signed)
Health Maintenance, Female Adopting a healthy lifestyle and getting preventive care are important in promoting health and wellness. Ask your health care provider about:  The right schedule for you to have regular tests and exams.  Things you can do on your own to prevent diseases and keep yourself healthy. What should I know about diet, weight, and exercise? Eat a healthy diet   Eat a diet that includes plenty of vegetables, fruits, low-fat dairy products, and lean protein.  Do not eat a lot of foods that are high in solid fats, added sugars, or sodium. Maintain a healthy weight Body mass index (BMI) is used to identify weight problems. It estimates body fat based on height and weight. Your health care provider can help determine your BMI and help you achieve or maintain a healthy weight. Get regular exercise Get regular exercise. This is one of the most important things you can do for your health. Most adults should:  Exercise for at least 150 minutes each week. The exercise should increase your heart rate and make you sweat (moderate-intensity exercise).  Do strengthening exercises at least twice a week. This is in addition to the moderate-intensity exercise.  Spend less time sitting. Even light physical activity can be beneficial. Watch cholesterol and blood lipids Have your blood tested for lipids and cholesterol at 75 years of age, then have this test every 5 years. Have your cholesterol levels checked more often if:  Your lipid or cholesterol levels are high.  You are older than 75 years of age.  You are at high risk for heart disease. What should I know about cancer screening? Depending on your health history and family history, you may need to have cancer screening at various ages. This may include screening for:  Breast cancer.  Cervical cancer.  Colorectal cancer.  Skin cancer.  Lung cancer. What should I know about heart disease, diabetes, and high blood  pressure? Blood pressure and heart disease  High blood pressure causes heart disease and increases the risk of stroke. This is more likely to develop in people who have high blood pressure readings, are of African descent, or are overweight.  Have your blood pressure checked: ? Every 3-5 years if you are 18-39 years of age. ? Every year if you are 40 years old or older. Diabetes Have regular diabetes screenings. This checks your fasting blood sugar level. Have the screening done:  Once every three years after age 40 if you are at a normal weight and have a low risk for diabetes.  More often and at a younger age if you are overweight or have a high risk for diabetes. What should I know about preventing infection? Hepatitis B If you have a higher risk for hepatitis B, you should be screened for this virus. Talk with your health care provider to find out if you are at risk for hepatitis B infection. Hepatitis C Testing is recommended for:  Everyone born from 1945 through 1965.  Anyone with known risk factors for hepatitis C. Sexually transmitted infections (STIs)  Get screened for STIs, including gonorrhea and chlamydia, if: ? You are sexually active and are younger than 75 years of age. ? You are older than 75 years of age and your health care provider tells you that you are at risk for this type of infection. ? Your sexual activity has changed since you were last screened, and you are at increased risk for chlamydia or gonorrhea. Ask your health care provider if   you are at risk.  Ask your health care provider about whether you are at high risk for HIV. Your health care provider may recommend a prescription medicine to help prevent HIV infection. If you choose to take medicine to prevent HIV, you should first get tested for HIV. You should then be tested every 3 months for as long as you are taking the medicine. Pregnancy  If you are about to stop having your period (premenopausal) and  you may become pregnant, seek counseling before you get pregnant.  Take 400 to 800 micrograms (mcg) of folic acid every day if you become pregnant.  Ask for birth control (contraception) if you want to prevent pregnancy. Osteoporosis and menopause Osteoporosis is a disease in which the bones lose minerals and strength with aging. This can result in bone fractures. If you are 65 years old or older, or if you are at risk for osteoporosis and fractures, ask your health care provider if you should:  Be screened for bone loss.  Take a calcium or vitamin D supplement to lower your risk of fractures.  Be given hormone replacement therapy (HRT) to treat symptoms of menopause. Follow these instructions at home: Lifestyle  Do not use any products that contain nicotine or tobacco, such as cigarettes, e-cigarettes, and chewing tobacco. If you need help quitting, ask your health care provider.  Do not use street drugs.  Do not share needles.  Ask your health care provider for help if you need support or information about quitting drugs. Alcohol use  Do not drink alcohol if: ? Your health care provider tells you not to drink. ? You are pregnant, may be pregnant, or are planning to become pregnant.  If you drink alcohol: ? Limit how much you use to 0-1 drink a day. ? Limit intake if you are breastfeeding.  Be aware of how much alcohol is in your drink. In the [place] one drink equals one 12 oz bottle of beer (355 mL), one 5 oz glass of wine (148 mL), or one 1 oz glass of hard liquor (44 mL). General instructions  Schedule regular health, dental, and eye exams.  Stay current with your vaccines.  Tell your health care provider if: ? You often feel depressed. ? You have ever been abused or do not feel safe at home. Summary  Adopting a healthy lifestyle and getting preventive care are important in promoting health and wellness.  Follow your health care provider's instructions about healthy  diet, exercising, and getting tested or screened for diseases.  Follow your health care provider's instructions on monitoring your cholesterol and blood pressure. This information is not intended to replace advice given to you by your health care provider. Make sure you discuss any questions you have with your health care provider. Document Revised: 08/06/2018 Document Reviewed: 08/06/2018 Elsevier Patient Education  2020 [employer]  

## 2019-11-11 ENCOUNTER — Other Ambulatory Visit: Payer: Self-pay

## 2019-11-11 ENCOUNTER — Emergency Department (HOSPITAL_BASED_OUTPATIENT_CLINIC_OR_DEPARTMENT_OTHER): Payer: Medicare Other

## 2019-11-11 ENCOUNTER — Emergency Department (HOSPITAL_BASED_OUTPATIENT_CLINIC_OR_DEPARTMENT_OTHER)
Admission: EM | Admit: 2019-11-11 | Discharge: 2019-11-11 | Disposition: A | Discharge status: Home / Self Care | Payer: Medicare Other | Attending: Emergency Medicine | Admitting: Emergency Medicine

## 2019-11-11 ENCOUNTER — Encounter (HOSPITAL_BASED_OUTPATIENT_CLINIC_OR_DEPARTMENT_OTHER): Payer: Self-pay

## 2019-11-11 DIAGNOSIS — I10 Essential (primary) hypertension: Secondary | ICD-10-CM | POA: Insufficient documentation

## 2019-11-11 DIAGNOSIS — Z87891 Personal history of nicotine dependence: Secondary | ICD-10-CM | POA: Insufficient documentation

## 2019-11-11 DIAGNOSIS — Z79899 Other long term (current) drug therapy: Secondary | ICD-10-CM | POA: Insufficient documentation

## 2019-11-11 DIAGNOSIS — R519 Headache, unspecified: Secondary | ICD-10-CM | POA: Diagnosis not present

## 2019-11-11 DIAGNOSIS — Z8572 Personal history of non-Hodgkin lymphomas: Secondary | ICD-10-CM | POA: Diagnosis not present

## 2019-11-11 LAB — CBC WITH DIFFERENTIAL/PLATELET
Abs Immature Granulocytes: 0.02 10*3/uL (ref 0.00–0.07)
Basophils Absolute: 0 10*3/uL (ref 0.0–0.1)
Basophils Relative: 1 %
Eosinophils Absolute: 0.1 10*3/uL (ref 0.0–0.5)
Eosinophils Relative: 2 %
HCT: 40.8 % (ref 36.0–46.0)
Hemoglobin: 13.9 g/dL (ref 12.0–15.0)
Immature Granulocytes: 0 %
Lymphocytes Relative: 30 %
Lymphs Abs: 1.7 10*3/uL (ref 0.7–4.0)
MCH: 31.7 pg (ref 26.0–34.0)
MCHC: 34.1 g/dL (ref 30.0–36.0)
MCV: 92.9 fL (ref 80.0–100.0)
Monocytes Absolute: 0.5 10*3/uL (ref 0.1–1.0)
Monocytes Relative: 9 %
Neutro Abs: 3.4 10*3/uL (ref 1.7–7.7)
Neutrophils Relative %: 58 %
Platelets: 205 10*3/uL (ref 150–400)
RBC: 4.39 MIL/uL (ref 3.87–5.11)
RDW: 12.1 % (ref 11.5–15.5)
WBC: 5.7 10*3/uL (ref 4.0–10.5)
nRBC: 0 % (ref 0.0–0.2)

## 2019-11-11 LAB — COMPREHENSIVE METABOLIC PANEL
ALT: 23 U/L (ref 0–44)
AST: 28 U/L (ref 15–41)
Albumin: 4.4 g/dL (ref 3.5–5.0)
Alkaline Phosphatase: 93 U/L (ref 38–126)
Anion gap: 10 (ref 5–15)
BUN: 11 mg/dL (ref 8–23)
CO2: 28 mmol/L (ref 22–32)
Calcium: 9.3 mg/dL (ref 8.9–10.3)
Chloride: 99 mmol/L (ref 98–111)
Creatinine, Ser: 0.75 mg/dL (ref 0.44–1.00)
GFR calc Af Amer: >60 mL/min (ref >60)
GFR calc non Af Amer: >60 mL/min (ref >60)
Glucose, Bld: 100 mg/dL — ABNORMAL HIGH (ref 70–99)
Potassium: 3.6 mmol/L (ref 3.5–5.1)
Sodium: 137 mmol/L (ref 135–145)
Total Bilirubin: 0.4 mg/dL (ref 0.3–1.2)
Total Protein: 7.1 g/dL (ref 6.5–8.1)

## 2019-11-11 LAB — URINALYSIS, MICROSCOPIC (REFLEX)

## 2019-11-11 LAB — URINALYSIS, ROUTINE W REFLEX MICROSCOPIC
Bilirubin Urine: NEGATIVE
Glucose, UA: NEGATIVE mg/dL
Ketones, ur: 15 mg/dL — AB
Leukocytes,Ua: NEGATIVE
Nitrite: NEGATIVE
Protein, ur: NEGATIVE mg/dL
Specific Gravity, Urine: <1.005 — ABNORMAL LOW (ref 1.005–1.030)
pH: 6 (ref 5.0–8.0)

## 2019-11-11 LAB — CBG MONITORING, ED: Glucose-Capillary: 89 mg/dL (ref 70–99)

## 2019-11-11 MED ORDER — ACETAMINOPHEN 325 MG PO TABS
650.0000 mg | ORAL_TABLET | Freq: Once | ORAL | Status: AC
  Administered 2019-11-11: 650 mg via ORAL
  Filled 2019-11-11: qty 2

## 2019-11-11 MED ORDER — IOHEXOL 350 MG/ML SOLN
100.0000 mL | Freq: Once | INTRAVENOUS | Status: AC | PRN
  Administered 2019-11-11: 100 mL via INTRAVENOUS

## 2019-11-11 NOTE — ED Triage Notes (Signed)
Pt arrives to ED with c/o headache this morning around 930 states that the pain was sharp, reports taking tramadol, 2 tylenol, and ibuprofen, with the pain going away. Then around 4 pm she reports she took her prescribed medications and started having the headache again, drove herself to ER, reports that her right face felt numb, states that it no longer feels numb.

## 2019-11-11 NOTE — ED Notes (Signed)
PT ambulated to BR. Steady gait.

## 2019-11-11 NOTE — Discharge Instructions (Addendum)
Your lab work and imaging studies were reassuring today.  Please follow-up with your primary care provider and neurology on this matter.  Return to the emergency department for persistent headache, numbness, weakness, facial droop, difficulty speaking or swallowing, or any other major concerns.

## 2019-11-11 NOTE — ED Notes (Signed)
Pt ambulated to and from restroom independently w/ a steady gait

## 2019-11-11 NOTE — ED Provider Notes (Signed)
[facility] EMERGENCY DEPARTMENT Provider Note   CSN: [id number] Arrival date & time: 11/11/19  1747     History Chief Complaint  Patient presents with  . Headache  . Numbness    Colleen White is a 73 y.o. female.  HPI      Colleen White is a 74 y.o. female, with a history of anxiety, GERD, non-Hodgkin's lymphoma in remission, A. fib, hyperlipidemia, HTN, presenting to the ED with headache. Around 9:30 AM this morning, patient states she was "doing some minor tasks around the house."  She began to feel a sharp, intermittent, moderate intensity pain to the right parietal region of the head.  She took ibuprofen, Tylenol, and tramadol and symptoms resolved within 30 minutes. Around 3 PM this afternoon, patient began to feel dull, aching pain to the right frontal region of the head as well as intermittent sharp pain to the right parietal region.  This was accompanied by bilateral upper and lower lip numbness and tingling.  The symptoms resolved around 4 PM and have not recurred.  She also notes she took her dose of Xarelto around 4 PM, which she would usually take before bed. She has not had the symptoms before.  Denies fever/chills, recent illness, falls/trauma, vision changes, extremity weakness or numbness, chest pain, shortness of breath, cough, abdominal pain, neck/back pain, facial droop, difficulty speaking, confusion, dizziness, or any other complaints.  Past Medical History:  Diagnosis Date  . Allergic rhinitis, cause unspecified   . Alopecia   . Anxiety   . Colon polyps   . Diverticulosis of colon (without mention of hemorrhage)   . Fibrocystic breast disease   . GERD (gastroesophageal reflux disease)   . Headache(784.0)   . Hemorrhoids   . History of echocardiogram    Echo 3/17:  Vigorous LVF, EF 65-70%, normal wall motion, grade 1 diastolic, mildly elevated LVOT velocity of 2.2 m/s-likely related to vigorous LVF  . History of stress test    Myoview  normal in 2/05. //  Stress echo (11/15) with no evidence for ischemia or infarction.  [name] HLD (hyperlipidemia)   . HTN (hypertension)   . Hypertension   . Mitral valve disorders(424.0)   . Non Hodgkin's lymphoma ([place])    Treated at [facility] with surgery and XRT.  Did not have chemotherapy.   . OSA (obstructive sleep apnea) 03/29/2016  . Osteopenia   . PAF (paroxysmal atrial fibrillation) ([place])    a. CHADS2-VASc=3 //  b. Xarelto started in 2017  . Pulmonary nodule    Presumed benign after observation.   . Sleep apnea   . Unspecified glaucoma(365.9)     Patient Active Problem List   Diagnosis Date Noted  . Diuretic-induced hypokalemia 11/04/2019  . NASH (nonalcoholic steatohepatitis) 09/10/2018  . B12 deficiency due to diet 02/18/2018  . Primary osteoarthritis involving multiple joints 06/30/2017  . 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 Current moderate episode of major depressive disorder without prior episode ([place]) 04/07/2017  . Vitamin D deficiency 12/24/2016  . OSA (obstructive sleep apnea) 03/29/2016  . Paroxysmal atrial fibrillation ([place]) 11/08/2015  . Varicose veins of right lower extremity with pain 04/13/2015  . Routine general medical examination at a health care facility 11/30/2013  . 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 [facility]) 10/20/2010  . GLAUCOMA 08/04/2008  . ALLERGIC RHINITIS 12/17/2007  . Osteoporosis 12/17/2007  . HYPERCHOLESTEROLEMIA 07/31/2007  . Essential hypertension 07/31/2007  . MITRAL VALVE PROLAPSE 07/31/2007  . GERD 07/31/2007    Past Surgical History:  Procedure Laterality Date  . ABDOMINAL HYSTERECTOMY  1996  . BASAL CELL CARCINOMA EXCISION     06/2017  . BREAST CYST ASPIRATION  2017  . EYE SURGERY  2009   Cataract  . 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 Cloyd Stagers, [facility]  . s/p ORIF prox phalanx  02/2007   right little finger----Dr Fredna Dow  . s/p TAH/BSO       OB History   No obstetric history on file.     Family  History  Problem Relation Age of Onset  . Hypertension Mother   . Breast cancer Mother   . Cancer Father        throat  . Diabetes Father   . Heart disease Father   . Prostate cancer Father   . Cancer Sister        breast (sister)  . Colon cancer Maternal Uncle     Social History   Tobacco Use  . Smoking status: Former Smoker    Quit date: 08/27/1974    Years since quitting: 45.2  . Smokeless tobacco: Never Used  Substance Use Topics  . Alcohol use: No    Comment: occasionally  . Drug use: No    Home Medications Prior to Admission medications   Medication Sig Start Date End Date Taking? Authorizing Provider  Ascorbic Acid (VITAMIN C PO) Take 500 mg by mouth daily.     [provider]  brimonidine-timolol (COMBIGAN) 0.2-0.5 % ophthalmic solution Combigan 0.2 %-0.5 % eye drops    [provider]  Calcium Carbonate-Vitamin D (CALCIUM 500 + D PO) Take 1 tablet by mouth 2 (two) times daily.    [provider]  Cholecalciferol (VITAMIN D3) 1000 units CAPS Take 3,000 Units by mouth.     [provider]  Coenzyme Q10 (CO Q 10) 100 MG CAPS Take 1 tablet by mouth daily.    [provider]  diltiazem (CARDIZEM CD) 120 MG 24 hr capsule TAKE (1) CAPSULE DAILY. 04/06/19   Bensimhon, Shaune Pascal, MD  docusate sodium (COLACE) 100 MG capsule Take 100 mg by mouth at bedtime.    [provider]  esomeprazole (NEXIUM) 20 MG packet Take 20 mg by mouth daily before breakfast.    [provider]  gabapentin (NEURONTIN) 100 MG capsule Take 1 capsule (100 mg total) daily by mouth. Patient taking differently: Take 300 mg by mouth at bedtime.  07/02/17   Janith Lima, MD  loratadine (CLARITIN) 10 MG tablet Take by mouth daily as needed.     [provider]  MINOXIDIL FOR WOMEN EX Apply topically. Applies to scalp for hairloss    [provider]  mirtazapine (REMERON) 15 MG tablet Take 7.5 mg by mouth at bedtime. Takes 1/2  tablet at bedtime for itching of scalp     [provider]  Multiple Vitamins-Minerals (PRESERVISION AREDS 2 PO) Take 1 tablet by mouth 2 (two) times daily.     [provider]  potassium chloride SA (KLOR-CON) 20 MEQ tablet Take 1 tablet (20 mEq total) by mouth 2 (two) times daily. 11/04/19   Janith Lima, MD  pravastatin (PRAVACHOL) 40 MG tablet TAKE 1 TABLET ONCE DAILY. 05/06/19   Janith Lima, MD  Probiotic Product (PROBIOTIC DAILY PO) Take 1 tablet by mouth daily.    [provider]  telmisartan-hydrochlorothiazide (MICARDIS HCT) 80-12.5 MG tablet TAKE 1 TABLET ONCE DAILY. 05/06/19   Janith Lima, MD  traMADol Veatrice Bourbon)  50 MG tablet TAKE ONE TABLET EVERY 6 HOURS AS NEEDED. 06/05/19   Janith Lima, MD  tretinoin (RETIN-A) 0.05 % cream Once daily     [provider]  XARELTO 20 MG TABS tablet TAKE 1 TABLET DAILY WITH SUPPER. 08/24/19   Larey Dresser, MD    Allergies    Patient has no known allergies.  Review of Systems   Review of Systems  Constitutional: Negative for chills, diaphoresis and fever.  Respiratory: Negative for cough and shortness of breath.   Cardiovascular: Negative for chest pain.  Gastrointestinal: Negative for abdominal pain, diarrhea, nausea and vomiting.  Genitourinary: Negative for dysuria, flank pain and hematuria.  Musculoskeletal: Negative for back pain and neck pain.  Neurological: Positive for headaches. Negative for dizziness, syncope, facial asymmetry, speech difficulty, weakness, light-headedness and numbness.  Psychiatric/Behavioral: Negative for confusion.  All other systems reviewed and are negative.   Physical Exam Updated Vital Signs BP 133/71   Pulse 73   Temp 97.7 F (36.5 C) (Oral)   Resp 18   Ht 5' 3"  (1.6 m)   Wt 71.4 kg   SpO2 100%   BMI 27.88 kg/m   Physical Exam Vitals and nursing note reviewed.  Constitutional:      General: She is not in acute distress.    Appearance: She is  well-developed. She is not diaphoretic.  HENT:     Head: Normocephalic and atraumatic.     Mouth/Throat:     Mouth: Mucous membranes are moist.     Pharynx: Oropharynx is clear.  Eyes:     Conjunctiva/sclera: Conjunctivae normal.  Cardiovascular:     Rate and Rhythm: Normal rate and regular rhythm.     Pulses: Normal pulses.          Radial pulses are 2+ on the right side and 2+ on the left side.       Posterior tibial pulses are 2+ on the right side and 2+ on the left side.     Heart sounds: Normal heart sounds.     Comments: Tactile temperature in the extremities appropriate and equal bilaterally. Pulmonary:     Effort: Pulmonary effort is normal. No respiratory distress.     Breath sounds: Normal breath sounds.  Abdominal:     Palpations: Abdomen is soft.     Tenderness: There is no abdominal tenderness. There is no guarding.  Musculoskeletal:     Cervical back: Neck supple.     Right lower leg: No edema.     Left lower leg: No edema.  Lymphadenopathy:     Cervical: No cervical adenopathy.  Skin:    General: Skin is warm and dry.  Neurological:     Mental Status: She is alert and oriented to person, place, and time.     Comments: No noted acute cognitive deficit. Sensation grossly intact to light touch in the extremities.   Grip strengths equal bilaterally.   Strength 5/5 in all extremities.  No gait disturbance.  Coordination intact.  Cranial nerves III-XII grossly intact. Patient noted to have left upper eyelid ptosis, however, she states this is a chronic finding. Handles oral secretions without noted difficulty.  No noted phonation or speech deficit. No facial droop.   Psychiatric:        Mood and Affect: Mood and affect normal.        Speech: Speech normal.        Behavior: Behavior normal.     ED Results / Procedures /  Treatments   Labs (all labs ordered are listed, but only abnormal results are displayed) Labs Reviewed  COMPREHENSIVE METABOLIC PANEL -  Abnormal; Notable for the following components:      Result Value   Glucose, Bld 100 (*)    All other components within normal limits  URINALYSIS, ROUTINE W REFLEX MICROSCOPIC - Abnormal; Notable for the following components:   Specific Gravity, Urine <1.005 (*)    Hgb urine dipstick SMALL (*)    Ketones, ur 15 (*)    All other components within normal limits  URINALYSIS, MICROSCOPIC (REFLEX) - Abnormal; Notable for the following components:   Bacteria, UA FEW (*)    All other components within normal limits  CBC WITH DIFFERENTIAL/PLATELET  CBG MONITORING, ED    EKG EKG Interpretation  Date/Time:  Wednesday November 11 2019 17:57:03 EDT Ventricular Rate:  79 PR Interval:    QRS Duration: 92 QT Interval:  387 QTC Calculation: 444 R Axis:   25 Text Interpretation: Sinus rhythm Probable left atrial enlargement No STEMI Confirmed by Nanda Quinton [phone]) on 11/11/2019 6:12:11 PM   Radiology CT Angio Head W or Wo Contrast  Addendum Date: 11/11/2019   ADDENDUM REPORT: 11/11/2019 22:34 ADDENDUM: There was a reporting error causing the findings of the CTA of the neck to be omitted. The aortic arch, both carotid systems and both vertebral arteries are normal. There is no atherosclerosis. No occlusion, dissection or stenosis. There is mild cervical degenerative facet disease without bony spinal canal stenosis. Soft tissues of the neck are normal. The lung apices are clear. Normal CTA of the neck. This was discussed with Arlean Hopping, PA at 10:30 PM on 11/11/19. Electronically Signed   By: Ulyses Jarred M.D.   On: 11/11/2019 22:34   Result Date: 11/11/2019 CLINICAL DATA:  Headaches. History of non-Hodgkin's lymphoma. EXAM: CT ANGIOGRAPHY HEAD TECHNIQUE: Multidetector CT imaging of the head was performed using the standard protocol during bolus administration of intravenous contrast. Multiplanar CT image reconstructions and MIPs were obtained to evaluate the vascular anatomy. CONTRAST:  133m OMNIPAQUE  IOHEXOL 350 MG/ML SOLN COMPARISON:  04/02/2016 FINDINGS: CT HEAD Brain: There is no mass, hemorrhage or extra-axial collection. The size and configuration of the ventricles and extra-axial CSF spaces are normal. There is hypoattenuation of the white matter, most commonly indicating chronic small vessel disease. Vascular: No abnormal hyperdensity of the major intracranial arteries or dural venous sinuses. No intracranial atherosclerosis. Skull: The visualized skull base, calvarium and extracranial soft tissues are normal. Sinuses/Orbits: No fluid levels or advanced mucosal thickening of the visualized paranasal sinuses. No mastoid or middle ear effusion. The orbits are normal. CTA HEAD POSTERIOR CIRCULATION: --Vertebral arteries: Normal V4 segments. --Posterior inferior cerebellar arteries (PICA): Patent origins from the vertebral arteries. --Anterior inferior cerebellar arteries (AICA): Patent origins from the basilar artery. --Basilar artery: Normal. --Superior cerebellar arteries: Normal. --Posterior cerebral arteries: Normal. Both originate from the basilar artery. Posterior communicating arteries (p-comm) are diminutive or absent. ANTERIOR CIRCULATION: --Intracranial internal carotid arteries: Normal. --Anterior cerebral arteries (ACA): Normal. Both A1 segments are present. Patent anterior communicating artery (a-comm). --Middle cerebral arteries (MCA): Normal. Venous sinuses: As permitted by contrast timing, patent. Anatomic variants: None IMPRESSION: Normal intracranial CTA. Electronically Signed: By: KUlyses JarredM.D. On: 11/11/2019 19:46   CT Angio Neck W and/or Wo Contrast  Addendum Date: 11/11/2019   ADDENDUM REPORT: 11/11/2019 22:34 ADDENDUM: There was a reporting error causing the findings of the CTA of the neck to be omitted. The aortic arch,  both carotid systems and both vertebral arteries are normal. There is no atherosclerosis. No occlusion, dissection or stenosis. There is mild cervical  degenerative facet disease without bony spinal canal stenosis. Soft tissues of the neck are normal. The lung apices are clear. Normal CTA of the neck. This was discussed with Arlean Hopping, PA at 10:30 PM on 11/11/19. Electronically Signed   By: Ulyses Jarred M.D.   On: 11/11/2019 22:34   Result Date: 11/11/2019 CLINICAL DATA:  Headaches. History of non-Hodgkin's lymphoma. EXAM: CT ANGIOGRAPHY HEAD TECHNIQUE: Multidetector CT imaging of the head was performed using the standard protocol during bolus administration of intravenous contrast. Multiplanar CT image reconstructions and MIPs were obtained to evaluate the vascular anatomy. CONTRAST:  142m OMNIPAQUE IOHEXOL 350 MG/ML SOLN COMPARISON:  04/02/2016 FINDINGS: CT HEAD Brain: There is no mass, hemorrhage or extra-axial collection. The size and configuration of the ventricles and extra-axial CSF spaces are normal. There is hypoattenuation of the white matter, most commonly indicating chronic small vessel disease. Vascular: No abnormal hyperdensity of the major intracranial arteries or dural venous sinuses. No intracranial atherosclerosis. Skull: The visualized skull base, calvarium and extracranial soft tissues are normal. Sinuses/Orbits: No fluid levels or advanced mucosal thickening of the visualized paranasal sinuses. No mastoid or middle ear effusion. The orbits are normal. CTA HEAD POSTERIOR CIRCULATION: --Vertebral arteries: Normal V4 segments. --Posterior inferior cerebellar arteries (PICA): Patent origins from the vertebral arteries. --Anterior inferior cerebellar arteries (AICA): Patent origins from the basilar artery. --Basilar artery: Normal. --Superior cerebellar arteries: Normal. --Posterior cerebral arteries: Normal. Both originate from the basilar artery. Posterior communicating arteries (p-comm) are diminutive or absent. ANTERIOR CIRCULATION: --Intracranial internal carotid arteries: Normal. --Anterior cerebral arteries (ACA): Normal. Both A1 segments  are present. Patent anterior communicating artery (a-comm). --Middle cerebral arteries (MCA): Normal. Venous sinuses: As permitted by contrast timing, patent. Anatomic variants: None IMPRESSION: Normal intracranial CTA. Electronically Signed: By: KUlyses JarredM.D. On: 11/11/2019 19:46    Procedures Procedures (including critical care time)  Medications Ordered in ED Medications  iohexol (OMNIPAQUE) 350 MG/ML injection 100 mL (100 mLs Intravenous Contrast Given 11/11/19 1909)  acetaminophen (TYLENOL) tablet 650 mg (650 mg Oral Given 11/11/19 2006)    ED Course  I have reviewed the triage vital signs and the nursing notes.  Pertinent labs & imaging results that were available during my care of the patient were reviewed by me and considered in my medical decision making (see chart for details).  Clinical Course as of Nov 10 2317  Wed Nov 11, 2019  2015 Repeat neurologic exam performed.  Denies neurologic deficits.  No neurologic deficits on exam.  Patient denies consistent headache.   [SJ]    Clinical Course User Index [SJ] Dejon Jungman, SHelane Gunther PA-C   MDM Rules/Calculators/A&P                      Patient presents with intermittent headache beginning today.  No focal neurologic deficits across multiple reexaminations. Patient is nontoxic appearing, afebrile, not tachycardic, not tachypneic, not hypotensive, maintains excellent SPO2 on room air, and is in no apparent distress.   I reviewed and interpreted the patient's labs and radiological studies. No acute abnormalities on CTA of the head and neck. Lab results overall reassuring. PCP and neurology follow-up. Strict return precautions discussed.  Patient voices understanding of these instructions, accepts the plan, and is comfortable with discharge.  Findings and plan of care discussed with JGara Kroner MD. Dr. LLaverta Baltimorepersonally evaluated and examined  this patient.  Vitals:   11/11/19 1805 11/11/19 1930 11/11/19 1932 11/11/19 2000  BP:   136/75 136/75 133/71  Pulse:  78 81 73  Resp:  20  18  Temp:   97.7 F (36.5 C)   TempSrc:   Oral   SpO2:  98% 98% 100%  Weight: 71.4 kg     Height: 5' 3"  (1.6 m)      Vitals:   11/11/19 2130 11/11/19 2200 11/11/19 2230 11/11/19 2258  BP: 111/63 (!) 116/58 100/74 125/71  Pulse: 66 69 68 62  Resp: 12 18 19    Temp:    97.8 F (36.6 C)  TempSrc:    Oral  SpO2: 98% 99% 98% 100%  Weight:      Height:         Final Clinical Impression(s) / ED Diagnoses Final diagnoses:  Right-sided headache    Rx / DC Orders ED Discharge Orders    None       Layla Maw 11/11/19 2322    Margette Fast, MD 11/12/19 1244

## 2019-11-11 NOTE — ED Notes (Signed)
PT to CT scan

## 2019-11-16 ENCOUNTER — Ambulatory Visit: Payer: Medicare Other | Admitting: Pharmacist

## 2019-11-16 ENCOUNTER — Other Ambulatory Visit: Payer: Self-pay

## 2019-11-16 DIAGNOSIS — I10 Essential (primary) hypertension: Secondary | ICD-10-CM

## 2019-11-16 DIAGNOSIS — K219 Gastro-esophageal reflux disease without esophagitis: Secondary | ICD-10-CM

## 2019-11-16 DIAGNOSIS — E7800 Pure hypercholesterolemia, unspecified: Secondary | ICD-10-CM

## 2019-11-16 DIAGNOSIS — I480 Paroxysmal atrial fibrillation: Secondary | ICD-10-CM

## 2019-11-16 NOTE — Progress Notes (Signed)
  I have reviewed this encounter including the documentation in this note and/or discussed this patient with the care management provider. I am certifying that I agree with the content of this note as supervising physician.  Colleen White 11/16/2019

## 2019-11-16 NOTE — Chronic Care Management (AMB) (Signed)
Chronic Care Management Pharmacy  Name: Colleen White  MRN: [id number] DOB: 04-Nov-1945 Chief Complaint/ HPI  Colleen White,  74 y.o. , female presents for their Initial CCM visit with the clinical pharmacist via telephone due to COVID-19 Pandemic.  PCP : Janith Lima, MD  Their chronic conditions include: HTN, HLD, Afib, OSA, allergies, GERD, NASH, lymphoma (remission 2012), depression  Wants to be active. Appt 4/25 with [facility].   Office Visits: 11/03/19 Dr Ronnald Ramp OV: BP controlled, hypokalemic d/t thiazide diuretic. Start KCL supplement. NASH, HLD, Afib controlled, no other med changes.  04/20/19 Dr Ronnald Ramp OV: urinary frequency, microscopic hematuria, urine culture negative. May consider treating OAB in future.  Consult Visit:  11/11/19 ED visit: c/o headache, numbness. No neurologic deficits on exam, CT negative for acute abnormalities.   10/29/19 Dr Lonna Cobb (dermatology [facility]): scalp dysesthesia and full body exam. Mirtazapine helps with itchy scalp.   08/06/19 Dr Laverta [facility] oncology): fibrocystic breast changes, family hx of breast cancer and personal history of nodular lymphoma. Monitored with 2 mammograms per year and MRI once a year.  07/20/19 Dr Valetta Close (ophthalmology): via claims.  07/17/19 Dr Marigene Ehlers (cardiology [facility]): Afib - controlled via CCB, DOAC and CPAP. No med changes.  07/01/19 Dr Radford Pax (cardiology - [facility]): no med changes.  Medications: Outpatient Encounter Medications as of 11/16/2019  Medication Sig  . Ascorbic Acid (VITAMIN C PO) Take 500 mg by mouth daily.   . brimonidine-timolol (COMBIGAN) 0.2-0.5 % ophthalmic solution Place 1 drop into both eyes every 12 (twelve) hours.   . Calcium Carbonate-Vitamin D (CALCIUM 500 + D PO) Take 1 tablet by mouth 2 (two) times daily.  . calcium citrate (CALCITRATE - DOSED IN MG ELEMENTAL CALCIUM) 950 (200 Ca) MG tablet Take 200 mg of elemental calcium by mouth 2 (two) times daily.  .  Cholecalciferol (VITAMIN D3) 1000 units CAPS Take 1,000 Units by mouth.   . Coenzyme Q10 (CO Q 10) 100 MG CAPS Take 1 tablet by mouth daily.  [name] diltiazem (CARDIZEM CD) 120 MG 24 hr capsule TAKE (1) CAPSULE DAILY.  [name] docusate sodium (COLACE) 100 MG capsule Take 100 mg by mouth at bedtime.  [name] esomeprazole (NEXIUM) 20 MG packet Take 20 mg by mouth daily before breakfast.  . gabapentin (NEURONTIN) 100 MG capsule Take 1 capsule (100 mg total) daily by mouth. (Patient taking differently: Take 300 mg by mouth at bedtime. )  . loratadine (CLARITIN) 10 MG tablet Take by mouth daily as needed.   [name] MINOXIDIL FOR WOMEN EX Apply topically. Applies to scalp for hairloss  . mirtazapine (REMERON) 15 MG tablet Take 7.5 mg by mouth at bedtime. Takes 1/2 tablet at bedtime for itching of scalp   . Multiple Vitamins-Minerals (PRESERVISION AREDS 2 PO) Take 1 tablet by mouth 2 (two) times daily.   . potassium chloride SA (KLOR-CON) 20 MEQ tablet Take 1 tablet (20 mEq total) by mouth 2 (two) times daily.  . pravastatin (PRAVACHOL) 40 MG tablet TAKE 1 TABLET ONCE DAILY.  . Probiotic Product (PROBIOTIC DAILY PO) Take 1 tablet by mouth daily.  [name] telmisartan-hydrochlorothiazide (MICARDIS HCT) 80-12.5 MG tablet TAKE 1 TABLET ONCE DAILY.  . traMADol (ULTRAM) 50 MG tablet TAKE ONE TABLET EVERY 6 HOURS AS NEEDED.  [name] tretinoin (RETIN-A) 0.05 % cream Once daily   . XARELTO 20 MG TABS tablet TAKE 1 TABLET DAILY WITH SUPPER.   No facility-administered encounter medications on file as of 11/16/2019.     Current Diagnosis/Assessment:  Goals  Addressed            This Visit's Progress   . Pharmacy Care Plan       CARE PLAN ENTRY  Current Barriers:  . Chronic Disease Management support, education, and care coordination needs related to Atrial Fibrillation, HTN, and HLD  Pharmacist Clinical Goal(s):  [name] Maintain BP < 130/80 . Maintain LDL < 100 . Ensure safety, efficacy, and affordability of  medications  Interventions: . Comprehensive medication review performed. . May discontinue Nexium (esomeprazole) if reflux symptoms are controlled without it  Patient Self Care Activities:  . Self administers medications as prescribed, Calls pharmacy for medication refills, and Calls provider office for new concerns or questions  Initial goal documentation       AFIB   Patient is currently rate controlled.  Patient has failed these meds in past: metoprolol (swelling) Patient is currently controlled on the following medications: diltiazem 120 mg daily (can take extra when HR elevated), Xarelto 20 mg daily   We discussed: benefits of rate control and anticoagulation. Pt denies issues with bruising or bleeding.  Plan  Continue current medications   Hypertension    Office blood pressures are  BP Readings from Last 3 Encounters:  11/11/19 125/71  11/03/19 126/78  07/17/19 118/70   Patient has failed these meds in the past: amlodipine Patient is currently controlled on the following medications: diltiazem 120 mg daily, telmisartan/HCTZ 80-12.5 mg daily, potassium 20 mEq BID  Patient checks BP at home infrequently  We discussed BP goals, side effects of diuretic including low potassium. Pt has been taking KCl supplement since she saw Dr Ronnald Ramp, note K was normal in the ED last week, she reports she will continue KCl until she sees Dr Ronnald Ramp again.  Plan  Continue current medications and control with diet and exercise   Hyperlipidemia   Lipid Panel     Component Value Date/Time   CHOL 138 11/03/2019 1309   TRIG 87.0 11/03/2019 1309   HDL 47.00 11/03/2019 1309   CHOLHDL 3 11/03/2019 1309   VLDL 17.4 11/03/2019 1309   LDLCALC 73 11/03/2019 1309     The 10-year ASCVD risk score Mikey Bussing DC Jr., et al., 2013) is: 19.5%   Values used to calculate the score:     Age: 74 years     Sex: Female     Is Non-Hispanic African American: No     Diabetic: No     Tobacco smoker:  No     Systolic Blood Pressure: 025 mmHg     Is BP treated: Yes     HDL Cholesterol: 47 mg/dL     Total Cholesterol: 138 mg/dL   Patient has failed these meds in past: atorvastatin (myalgias) Patient is currently controlled on the following medications: pravastatin 40 mg daily, CoQ10  We discussed:  Cholesterol goals, benefits of CoQ10 with statins. Pt reports she was on atorvastatin before and may have had muscle cramps, she denies issues with pravastatin.    Plan  Continue current medications   Scalp dysesthesia/Hair loss   Patient has failed these meds in past: n/a Patient is currently controlled on the following medications: mirtazapine 1.875 mg HS (1/8 of 15 mg - pt cuts in 1/2, then cuts the 1/2 tab in quarters and takes a few times per week), gabapentin 300 mg HS, minoxidil 5% solution, tretinoin 0.05% cream to face  We discussed: pt sees a dermatologist who prescribes gabapentin and mirtazapine for scalp dysesthesia. Pt takes as above and  per pt, the prescriber is aware she takes them this way. Pt reports her condition is under control this way.  Plan  Continue current medications    GERD/GI   Patient has failed these meds in past: n/a Patient is currently controlled on the following medications: esomeprazole 20 mg packet daily, docusate 100 mg HS, probiotic  We discussed:   Pt does not take PPI every day - 2-3 days per week. She denies GERD symptoms. Discussed possibility of discontinuing PPI entirely, pt would like to continue sporadic dosing to deter GERD symptoms.  Plan  Continue current medications    Glaucoma   Patient has failed these meds in past: n/a Patient is currently controlled on the following medications: Brimonidine-timolol (Combigan) eye drops OU BID  We discussed: Pt endorses compliance with eye drops, denies issues.  Plan  Continue current medications   Allergies   Patient has failed these meds in past: n/a Patient is currently  controlled on the following medications: loratadine 10 mg daily prn  We discussed:  Pt c/o seasonal allergies, controlled with daily loratadine.  Plan  Continue current medications   Health Maintenance   Patient is currently controlled on the following medications: Vitamin C, calcium-Vitamin D, Vitamin D3, CoQ10, Preservision, tramadol 50 mg PRN  We discussed:  Pt reports she is satisfied with OTC regimen. She endorses tramadol for pain mgmt (cant takes NSAIDs), knee problems.   Plan  Continue current medications   Medication Management   Pt uses [facility] for all medications Does not use pill box - sets up pill bottles for AM and PM locations Pt endorses 99% compliance  We discussed:  Pt's husband is a Software engineer who owned [employer] for 40+ years, she denies issues with med refills and endorses good compliance.  Plan  Continue current med management stategy     Follow up: 6 month phone visit  Charlene Brooke, PharmD Clinical Pharmacist [facility] at [facility] [phone]

## 2019-11-16 NOTE — Patient Instructions (Addendum)
Visit Information  Thank you for meeting with me to discuss your medications! I look forward to working with you to achieve your health care goals. Below is a summary of what we talked about during the visit:  Goals Addressed            This Visit's Progress   . Pharmacy Care Plan       CARE PLAN ENTRY  Current Barriers:  . Chronic Disease Management support, education, and care coordination needs related to Atrial Fibrillation, HTN, and HLD  Pharmacist Clinical Goal(s):  [name] Maintain BP < 130/80 . Maintain LDL < 100 . Ensure safety, efficacy, and affordability of medications  Interventions: . Comprehensive medication review performed.  Patient Self Care Activities:  . Self administers medications as prescribed, Calls pharmacy for medication refills, and Calls provider office for new concerns or questions  Initial goal documentation       Ms. Jutte was given information about Chronic Care Management services today including:  1. CCM service includes personalized support from designated clinical staff supervised by her physician, including individualized plan of care and coordination with other care providers 2. 24/7 contact phone numbers for assistance for urgent and routine care needs. 3. Standard insurance, coinsurance, copays and deductibles apply for chronic care management only during months in which we provide at least 20 minutes of these services. Most insurances cover these services at 100%, however patients may be responsible for any copay, coinsurance and/or deductible if applicable. This service may help you avoid the need for more expensive face-to-face services. 4. Only one practitioner may furnish and bill the service in a calendar month. 5. The patient may stop CCM services at any time (effective at the end of the month) by phone call to the office staff.  Patient agreed to services and verbal consent obtained.   The patient verbalized understanding of  instructions provided today and declined a print copy of patient instruction materials.  Telephone follow up appointment with pharmacy team member scheduled for: 05/18/20 @ 1:00 pm   Charlene Brooke, PharmD Clinical Pharmacist [facility] at [place] [facility] After Age 66 After age 51, you are at a higher risk for certain long-term diseases and infections as well as injuries from falls. Falls are a major cause of broken bones and head injuries in people who are older than age 61. Getting regular preventive care can help to keep you healthy and well. Preventive care includes getting regular testing and making lifestyle changes as recommended by your health care provider. Talk with your health care provider about:  Which screenings and tests you should have. A screening is a test that checks for a disease when you have no symptoms.  A diet and exercise plan that is right for you. What should I know about screenings and tests to prevent falls? Screening and testing are the best ways to find a health problem early. Early diagnosis and treatment give you the best chance of managing medical conditions that are common after age 53. Certain conditions and lifestyle choices may make you more likely to have a fall. Your health care provider may recommend:  Regular vision checks. Poor vision and conditions such as cataracts can make you more likely to have a fall. If you wear glasses, make sure to get your prescription updated if your vision changes.  Medicine review. Work with your health care provider to regularly review all of the medicines you are taking, including over-the-counter medicines.  Ask your health care provider about any side effects that may make you more likely to have a fall. Tell your health care provider if any medicines that you take make you feel dizzy or sleepy.  Osteoporosis screening. Osteoporosis is a condition that causes the bones to get  weaker. This can make the bones weak and cause them to break more easily.  Blood pressure screening. Blood pressure changes and medicines to control blood pressure can make you feel dizzy.  Strength and balance checks. Your health care provider may recommend certain tests to check your strength and balance while standing, walking, or changing positions.  Foot health exam. Foot pain and numbness, as well as not wearing proper footwear, can make you more likely to have a fall.  Depression screening. You may be more likely to have a fall if you have a fear of falling, feel emotionally low, or feel unable to do activities that you used to do.  Alcohol use screening. Using too much alcohol can affect your balance and may make you more likely to have a fall. What actions can I take to lower my risk of falls? General instructions  Talk with your health care provider about your risks for falling. Tell your health care provider if: ? You fall. Be sure to tell your health care provider about all falls, even ones that seem minor. ? You feel dizzy, sleepy, or off-balance.  Take over-the-counter and prescription medicines only as told by your health care provider. These include any supplements.  Eat a healthy diet and maintain a healthy weight. A healthy diet includes low-fat dairy products, low-fat (lean) meats, and fiber from whole grains, beans, and lots of fruits and vegetables. Home safety  Remove any tripping hazards, such as rugs, cords, and clutter.  Install safety equipment such as grab bars in bathrooms and safety rails on stairs.  Keep rooms and walkways well-lit. Activity   Follow a regular exercise program to stay fit. This will help you maintain your balance. Ask your health care provider what types of exercise are appropriate for you.  If you need a cane or walker, use it as recommended by your health care provider.  Wear supportive shoes that have nonskid soles. Lifestyle  Do  not drink alcohol if your health care provider tells you not to drink.  If you drink alcohol, limit how much you have: ? 0-1 drink a day for women. ? 0-2 drinks a day for men.  Be aware of how much alcohol is in your drink. In the [place] one drink equals one typical bottle of beer (12 oz), one-half glass of wine (5 oz), or one shot of hard liquor (1 oz).  Do not use any products that contain nicotine or tobacco, such as cigarettes and e-cigarettes. If you need help quitting, ask your health care provider. Summary  Having a healthy lifestyle and getting preventive care can help to protect your health and wellness after age 55.  Screening and testing are the best way to find a health problem early and help you avoid having a fall. Early diagnosis and treatment give you the best chance for managing medical conditions that are more common for people who are older than age 47.  Falls are a major cause of broken bones and head injuries in people who are older than age 52. Take precautions to prevent a fall at home.  Work with your health care provider to learn what changes you can make to improve your  health and wellness and to prevent falls. This information is not intended to replace advice given to you by your health care provider. Make sure you discuss any questions you have with your health care provider. Document Revised: 12/04/2018 Document Reviewed: 06/26/2017 Elsevier Patient Education  2020 [employer].

## 2019-11-19 NOTE — Addendum Note (Signed)
Addended by: Karle Barr on: 11/19/2019 05:35 AM   Modules accepted: Orders

## 2019-11-23 ENCOUNTER — Other Ambulatory Visit: Payer: Self-pay | Admitting: Internal Medicine

## 2019-11-23 DIAGNOSIS — E7800 Pure hypercholesterolemia, unspecified: Secondary | ICD-10-CM

## 2019-11-30 ENCOUNTER — Other Ambulatory Visit: Payer: Self-pay | Admitting: Internal Medicine

## 2019-11-30 DIAGNOSIS — I10 Essential (primary) hypertension: Secondary | ICD-10-CM

## 2019-12-02 DIAGNOSIS — R922 Inconclusive mammogram: Secondary | ICD-10-CM | POA: Diagnosis not present

## 2019-12-02 DIAGNOSIS — Z803 Family history of malignant neoplasm of breast: Secondary | ICD-10-CM | POA: Diagnosis not present

## 2019-12-02 DIAGNOSIS — R8290 Unspecified abnormal findings in urine: Secondary | ICD-10-CM | POA: Diagnosis not present

## 2019-12-02 DIAGNOSIS — Z124 Encounter for screening for malignant neoplasm of cervix: Secondary | ICD-10-CM | POA: Diagnosis not present

## 2019-12-21 ENCOUNTER — Other Ambulatory Visit: Payer: Self-pay | Admitting: Internal Medicine

## 2019-12-21 ENCOUNTER — Ambulatory Visit: Payer: Medicare Other | Admitting: Neurology

## 2019-12-21 DIAGNOSIS — M159 Polyosteoarthritis, unspecified: Secondary | ICD-10-CM

## 2019-12-21 DIAGNOSIS — M8949 Other hypertrophic osteoarthropathy, multiple sites: Secondary | ICD-10-CM

## 2019-12-21 HISTORY — PX: OTHER SURGICAL HISTORY: SHX169

## 2020-01-11 ENCOUNTER — Encounter: Payer: Self-pay | Admitting: Neurology

## 2020-01-11 ENCOUNTER — Ambulatory Visit (INDEPENDENT_AMBULATORY_CARE_PROVIDER_SITE_OTHER): Payer: Medicare Other | Admitting: Neurology

## 2020-01-11 ENCOUNTER — Other Ambulatory Visit: Payer: Self-pay

## 2020-01-11 ENCOUNTER — Telehealth: Payer: Self-pay | Admitting: Neurology

## 2020-01-11 VITALS — BP 103/70 | HR 63 | Temp 97.0°F | Ht 63.0 in | Wt 142.0 lb

## 2020-01-11 DIAGNOSIS — R200 Anesthesia of skin: Secondary | ICD-10-CM | POA: Diagnosis not present

## 2020-01-11 DIAGNOSIS — G4485 Primary stabbing headache: Secondary | ICD-10-CM | POA: Diagnosis not present

## 2020-01-11 DIAGNOSIS — R519 Headache, unspecified: Secondary | ICD-10-CM | POA: Diagnosis not present

## 2020-01-11 NOTE — Telephone Encounter (Signed)
For open MRI order faxed to [facility] they will reach out to the patient to schedule.

## 2020-01-11 NOTE — Patient Instructions (Addendum)
Take Xarelto with evening meal MRI of the brain  Rivaroxaban oral tablets What is this medicine? RIVAROXABAN (ri va ROX a ban) is an anticoagulant (blood thinner). It is used to treat blood clots in the lungs or in the veins. It is also used to prevent blood clots in the lungs or in the veins. It is also used to lower the chance of stroke in people with a medical condition called atrial fibrillation. This medicine may be used for other purposes; ask your health care provider or pharmacist if you have questions. COMMON BRAND NAME(S): Xarelto, Xarelto Starter Pack What should I tell my health care provider before I take this medicine? They need to know if you have any of these conditions:  antiphospholipid antibody syndrome  artificial heart valve  bleeding disorders  bleeding in the brain  blood in your stools (black or tarry stools) or if you have blood in your vomit  history of blood clots  history of stomach bleeding  kidney disease  liver disease  low blood counts, like low white cell, platelet, or red cell counts  recent or planned spinal or epidural procedure  take medicines that treat or prevent blood clots  an unusual or allergic reaction to rivaroxaban, other medicines, foods, dyes, or preservatives  pregnant or trying to get pregnant  breast-feeding How should I use this medicine? Take this medicine by mouth with a glass of water. Follow the directions on the prescription label. Take your medicine at regular intervals. Do not take it more often than directed. Do not stop taking except on your doctor's advice. Stopping this medicine may increase your risk of a blood clot. Be sure to refill your prescription before you run out of medicine. If you are taking this medicine after hip or knee replacement surgery, take it with or without food. If you are taking this medicine for atrial fibrillation, take it with your evening meal. If you are taking this medicine to treat  blood clots, take it with food at the same time each day. If you are unable to swallow your tablet, you may crush the tablet and mix it in applesauce. Then, immediately eat the applesauce. You should eat more food right after you eat the applesauce containing the crushed tablet. Talk to your pediatrician regarding the use of this medicine in children. Special care may be needed. Overdosage: If you think you have taken too much of this medicine contact a poison control center or emergency room at once. NOTE: This medicine is only for you. Do not share this medicine with others. What if I miss a dose? If you take your medicine once a day and miss a dose, take the missed dose as soon as you remember. If it is almost time for your next dose, take only that dose. Do not take double or extra doses. If you take your medicine twice a day and miss a dose, take the missed dose immediately. In this instance, 2 tablets may be taken at the same time. The next day you should take 1 tablet twice a day as directed. What may interact with this medicine? Do not take this medicine with any of the following medications:  defibrotide This medicine may also interact with the following medications:  aspirin and aspirin-like medicines  certain antibiotics like erythromycin, azithromycin, and clarithromycin  certain medicines for fungal infections like ketoconazole and itraconazole  certain medicines for irregular heart beat like amiodarone, quinidine, dronedarone  certain medicines for seizures like carbamazepine,  phenytoin  certain medicines that treat or prevent blood clots like warfarin, enoxaparin, and dalteparin  conivaptan  felodipine  indinavir  lopinavir; ritonavir  NSAIDS, medicines for pain and inflammation, like ibuprofen or naproxen  ranolazine  rifampin  ritonavir  SNRIs, medicines for depression, like desvenlafaxine, duloxetine, levomilnacipran, venlafaxine  SSRIs, medicines for  depression, like citalopram, escitalopram, fluoxetine, fluvoxamine, paroxetine, sertraline  St. John's wort  verapamil This list may not describe all possible interactions. Give your health care provider a list of all the medicines, herbs, non-prescription drugs, or dietary supplements you use. Also tell them if you smoke, drink alcohol, or use illegal drugs. Some items may interact with your medicine. What should I watch for while using this medicine? Visit your healthcare professional for regular checks on your progress. You may need blood work done while you are taking this medicine. Your condition will be monitored carefully while you are receiving this medicine. It is important not to miss any appointments. Avoid sports and activities that might cause injury while you are using this medicine. Severe falls or injuries can cause unseen bleeding. Be careful when using sharp tools or knives. Consider using an Copy. Take special care brushing or flossing your teeth. Report any injuries, bruising, or red spots on the skin to your healthcare professional. If you are going to need surgery or other procedure, tell your healthcare professional that you are taking this medicine. Wear a medical ID bracelet or chain. Carry a card that describes your disease and details of your medicine and dosage times. What side effects may I notice from receiving this medicine? Side effects that you should report to your doctor or health care professional as soon as possible:  allergic reactions like skin rash, itching or hives, swelling of the face, lips, or tongue  back pain  redness, blistering, peeling or loosening of the skin, including inside the mouth  signs and symptoms of bleeding such as bloody or black, tarry stools; red or dark-brown urine; spitting up blood or brown material that looks like coffee grounds; red spots on the skin; unusual bruising or bleeding from the eye, gums, or nose  signs  and symptoms of a blood clot such as chest pain; shortness of breath; pain, swelling, or warmth in the leg  signs and symptoms of a stroke such as changes in vision; confusion; trouble speaking or understanding; severe headaches; sudden numbness or weakness of the face, arm or leg; trouble walking; dizziness; loss of coordination Side effects that usually do not require medical attention (report to your doctor or health care professional if they continue or are bothersome):  dizziness  muscle pain This list may not describe all possible side effects. Call your doctor for medical advice about side effects. You may report side effects to FDA at 1-800-FDA-1088. Where should I keep my medicine? Keep out of the reach of children. Store at room temperature between 15 and 30 degrees C (59 and 86 degrees F). Throw away any unused medicine after the expiration date. NOTE: This sheet is a summary. It may not cover all possible information. If you have questions about this medicine, talk to your doctor, pharmacist, or health care provider.  2020 Elsevier/Gold Standard (2018-11-10 09:45:59)

## 2020-01-11 NOTE — Telephone Encounter (Signed)
Medicare/bcbs supp order sent to [facility]. No auth they will reach out to the patient to schedule.

## 2020-01-11 NOTE — Progress Notes (Signed)
[facility]    Provider:  Dr Jaynee Eagles Requesting Provider: Janith Lima, MD Primary Care Provider:  Janith Lima, MD  CC:  headache  HPI:  Colleen White is a 74 y.o. female here as requested by Janith Lima, MD for headache from the emergency room.  She has a past medical history of anxiety, GERD, non-Hodgkin's lymphoma in remission, A. fib, hyperlipidemia, remote hx of migraines,  hypertension.  She presented in March of this year to the emergency room.  I reviewed emergency room notes: She presented with acute onset right parietal headache and perioral numbness.  Stated she was doing some minor tasks around the house, she began to feel a sharp intermittent moderate intensity pain to the right parietal lobe, she took ibuprofen Tylenol and tramadol and symptoms resolved within 30 minutes.  It was also accompanied by bilateral upper and lower lip numbness and tingling, symptoms resolved around 4:00 and have not recurred.  She is on Xarelto for A. fib there were no neurologic deficits on examination, no suspicion of stroke, not consistent with TIA, CTA of the head and neck along with labs were reassuring, headache resolved with Tylenol no exam findings or historical features to suspect giant cell arteritis.  No other focal neurologic deficits, associated symptoms, inciting events or modifiable factors.  On the 17th of March she had excruciating pain (points to right parieto-temporal), she took ibuprofen and tramadol and tylenol, it went away, around 330-4 it happened again, went to the ED and had a workup and lab work and everything unremarkable.It was a sharp pain that turned into a dull pain.  They gave her a tyelnol and she felt better. No photo/phonophobia, no nausea or vomiting, no autonomic symptoms, it was not pounding or pulsating. It subsided in 30 minutes and slowly went away. Never had anything like this in the past. She has been having a dull headache and in the  past week no headache at all. No vision changes, no pain in the temples, the dull headache moved to the frontal area. It was moderately severe, no inciting events, no head trauma, happened about 930am and she was just in the house. It was excruciating initially and then moderalely severe. She also reported numbness in the lips but she was very "excited" unclear if on one side, mild and questionable.   Reviewed notes, labs and imaging from outside physicians, which showed: I reviewed CT angiogram of the head and CTA, CT of the head showed no acute findings some microvascular ischemic changes and atrophy, CT neck no significant intracranial stenosis.  TSH and B12 normal, CBC and CMP unremarkable March 2021.  Review of Systems: Patient complains of symptoms per HPI as well as the following symptoms: joint pain. Pertinent negatives and positives per HPI. All others negative.   Social History   Socioeconomic History  . Marital status: Married    Spouse name: Not on file  . Number of children: 0  . Years of education: Not on file  . Highest education level: Not on file  Occupational History  . Not on file  Tobacco Use  . Smoking status: Former Smoker    Quit date: 08/27/1974    Years since quitting: 45.4  . Smokeless tobacco: Never Used  Substance and Sexual Activity  . Alcohol use: No    Comment: occasionally  . Drug use: No  . Sexual activity: Yes  Other Topics Concern  . Not on file  Social History Narrative  Lives at home with spouse   Retired   Caffeine: 3-4 cups/day   Social Determinants of Radio broadcast assistant Strain:   . Difficulty of Paying Living Expenses:   Food Insecurity:   . Worried About Charity fundraiser in the Last Year:   . Arboriculturist in the Last Year:   Transportation Needs:   . Film/video editor (Medical):   [name] Lack of Transportation (Non-Medical):   Physical Activity:   . Days of Exercise per Week:   . Minutes of Exercise per Session:     Stress:   . Feeling of Stress :   Social Connections:   . Frequency of Communication with Friends and Family:   . Frequency of Social Gatherings with Friends and Family:   . Attends Religious Services:   . Active Member of Clubs or Organizations:   . Attends Archivist Meetings:   [name] Marital Status:   Intimate Partner Violence:   . Fear of Current or Ex-Partner:   . Emotionally Abused:   [name] Physically Abused:   . Sexually Abused:     Family History  Problem Relation Age of Onset  . Hypertension Mother   . Breast cancer Mother        double mastectomy  . Heart disease Mother   . Cancer Father        throat, prostate  . Diabetes Father   . Heart disease Father   . Prostate cancer Father   . Cancer Sister        breast (sister), spread to brain  . Colon cancer Maternal Uncle     Past Medical History:  Diagnosis Date  . Allergic rhinitis, cause unspecified   . Alopecia   . Anxiety   . Colon polyps   . Diverticulosis of colon (without mention of hemorrhage)   . Fibrocystic breast disease   . GERD (gastroesophageal reflux disease)   . Headache(784.0)   . Hemorrhoids   . History of echocardiogram    Echo 3/17:  Vigorous LVF, EF 65-70%, normal wall motion, grade 1 diastolic, mildly elevated LVOT velocity of 2.2 m/s-likely related to vigorous LVF  . History of stress test    Myoview normal in 2/05. //  Stress echo (11/15) with no evidence for ischemia or infarction.  [name] HLD (hyperlipidemia)   . HTN (hypertension)   . Hypertension   . Mitral valve disorders(424.0)   . Non Hodgkin's lymphoma ([facility])    Treated at [facility] with surgery and XRT.  Did not have chemotherapy.   . OSA (obstructive sleep apnea) 03/29/2016  . Osteopenia   . PAF (paroxysmal atrial fibrillation) ([place])    a. CHADS2-VASc=3 //  b. Xarelto started in 2017  . Pulmonary nodule    Presumed benign after observation.   . Sleep apnea   . Unspecified glaucoma(365.9)     Patient Active Problem List    Diagnosis Date Noted  . Diuretic-induced hypokalemia 11/04/2019  . NASH (nonalcoholic steatohepatitis) 09/10/2018  . B12 deficiency due to diet 02/18/2018  . Primary osteoarthritis involving multiple joints 06/30/2017  . 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 Current moderate episode of major depressive disorder without prior episode ([place]) 04/07/2017  . Vitamin D deficiency 12/24/2016  . OSA (obstructive sleep apnea) 03/29/2016  . Paroxysmal atrial fibrillation ([place]) 11/08/2015  . Varicose veins of right lower extremity with pain 04/13/2015  . Routine general medical examination at a health care facility 11/30/2013  . Lymphoma of extranodal and solid  organ sites [facility]) 10/20/2010  . GLAUCOMA 08/04/2008  . ALLERGIC RHINITIS 12/17/2007  . Osteoporosis 12/17/2007  . HYPERCHOLESTEROLEMIA 07/31/2007  . Essential hypertension 07/31/2007  . MITRAL VALVE PROLAPSE 07/31/2007  . GERD 07/31/2007    Past Surgical History:  Procedure Laterality Date  . ABDOMINAL HYSTERECTOMY  1996  . BASAL CELL CARCINOMA EXCISION     06/2017  . BREAST CYST ASPIRATION  2017  . dental implant  12/21/2019   #31  . EYE SURGERY  2009   Cataract  . 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 Cloyd Stagers, [facility]  . s/p ORIF prox phalanx  02/2007   right little finger----Dr Fredna Dow  . s/p TAH/BSO      Current Outpatient Medications  Medication Sig Dispense Refill  . Ascorbic Acid (VITAMIN C PO) Take 500 mg by mouth daily.     . brimonidine-timolol (COMBIGAN) 0.2-0.5 % ophthalmic solution Place 1 drop into both eyes every 12 (twelve) hours.     . Calcium Carbonate-Vitamin D (CALCIUM 500 + D PO) Take 1 tablet by mouth 2 (two) times daily.    . calcium citrate (CALCITRATE - DOSED IN MG ELEMENTAL CALCIUM) 950 (200 Ca) MG tablet Take 200 mg of elemental calcium by mouth 2 (two) times daily.    . Cholecalciferol (VITAMIN D3) 1000 units CAPS Take 1,000 Units by mouth.     . Coenzyme Q10 (CO Q 10) 100 MG CAPS  Take 1 tablet by mouth daily.    [name] diltiazem (CARDIZEM CD) 120 MG 24 hr capsule TAKE (1) CAPSULE DAILY. 90 capsule 3  . docusate sodium (COLACE) 100 MG capsule Take 100 mg by mouth at bedtime.    [name] esomeprazole (NEXIUM) 20 MG packet Take 20 mg by mouth daily before breakfast. Takes as needed.    . gabapentin (NEURONTIN) 100 MG capsule Take 1 capsule (100 mg total) daily by mouth. (Patient taking differently: Take 300 mg by mouth at bedtime. ) 90 capsule 0  . loratadine (CLARITIN) 10 MG tablet Take by mouth daily as needed.     [name] MINOXIDIL FOR MEN EX Apply topically. Takes as needed at night.    . mirtazapine (REMERON) 15 MG tablet Take 7.5 mg by mouth at bedtime. Takes 1/4 tablet at bedtime for itching of scalp    . Multiple Vitamins-Minerals (PRESERVISION AREDS 2 PO) Take 1 tablet by mouth 2 (two) times daily.     . potassium chloride SA (KLOR-CON) 20 MEQ tablet Take 1 tablet (20 mEq total) by mouth 2 (two) times daily. 180 tablet 0  . pravastatin (PRAVACHOL) 40 MG tablet TAKE 1 TABLET ONCE DAILY. 90 tablet 1  . Probiotic Product (PROBIOTIC DAILY PO) Take 1 tablet by mouth daily.    [name] telmisartan-hydrochlorothiazide (MICARDIS HCT) 80-12.5 MG tablet TAKE 1 TABLET ONCE DAILY. 90 tablet 1  . traMADol (ULTRAM) 50 MG tablet TAKE ONE TABLET EVERY 6 HOURS AS NEEDED. 65 tablet 3  . tretinoin (RETIN-A) 0.05 % cream Once daily     . XARELTO 20 MG TABS tablet TAKE 1 TABLET DAILY WITH SUPPER. 30 tablet 5   No current facility-administered medications for this visit.    Allergies as of 01/11/2020  . (No Known Allergies)    Vitals: BP 103/70 (BP Location: Left Arm, Patient Position: Sitting)   Pulse 63   Temp (!) 97 F (36.1 C) Comment: taken at front  Ht 5' 3"  (1.6 m)   Wt 142 lb (64.4 kg)   BMI 25.15  kg/m  Last Weight:  Wt Readings from Last 1 Encounters:  01/11/20 142 lb (64.4 kg)   Last Height:   Ht Readings from Last 1 Encounters:  01/11/20 5' 3"  (1.6 m)     Physical  exam: Exam: Gen: NAD, conversant, well nourised, well groomed                     CV: RRR, no MRG. No Carotid Bruits. No peripheral edema, warm, nontender Eyes: Conjunctivae clear without exudates or hemorrhage  Neuro: Detailed Neurologic Exam  Speech:    Speech is normal; fluent and spontaneous with normal comprehension.  Cognition:    The patient is oriented to person, place, and time;     recent and remote memory intact;     language fluent;     normal attention, concentration,     fund of knowledge Cranial Nerves:    The pupils are equal, round, and reactive to light. The fundi are normal and spontaneous venous pulsations are present. Visual fields are full to finger confrontation. Extraocular movements are intact. Trigeminal sensation is intact and the muscles of mastication are normal. The face is symmetric(bilateral ptosis, chronic, symmetrical). The palate elevates in the midline. Hearing intact. Voice is normal. Shoulder shrug is normal. The tongue has normal motion without fasciculations.   Coordination:    No ataxia or dysmetria  Gait:    Normal native gait  Motor Observation:    No asymmetry, no atrophy, and no involuntary movements noted. Tone:    Normal muscle tone.    Posture:    Posture is normal. normal erect    Strength:    Strength is V/V in the upper and lower limbs.      Sensation: intact to LT     Reflex Exam:  DTR's:   Absent AJs, otherwise Deep tendon reflexes in the patellars and biceps are normal bilaterally.   Toes:    The toes are downgoing bilaterally.   Clonus:    Clonus is absent.    Assessment/Plan:  28 74 year old with a new headache, right parietal stabbing and facial numbness, prior hx of migraines. Resolved but she is concerned bc it is new. She has been taking her xarelto improperly and she has afib, also a hx of lymphoma, so we should check an MRI of the brain w/wo contrast to insure no lesions or embolic strokes. Neuro exam  reassuring.   Orders Placed This Encounter  Procedures  . MR BRAIN W WO CONTRAST     Cc: Janith Lima, MD,  Sarina Ill, MD  [facility] [address] [place], [place] [address]  Phone [phone] Fax [phone]

## 2020-01-27 DIAGNOSIS — H353131 Nonexudative age-related macular degeneration, bilateral, early dry stage: Secondary | ICD-10-CM | POA: Diagnosis not present

## 2020-01-27 DIAGNOSIS — H43813 Vitreous degeneration, bilateral: Secondary | ICD-10-CM | POA: Diagnosis not present

## 2020-01-27 DIAGNOSIS — H401131 Primary open-angle glaucoma, bilateral, mild stage: Secondary | ICD-10-CM | POA: Diagnosis not present

## 2020-01-27 DIAGNOSIS — H52203 Unspecified astigmatism, bilateral: Secondary | ICD-10-CM | POA: Diagnosis not present

## 2020-01-28 ENCOUNTER — Other Ambulatory Visit: Payer: Self-pay

## 2020-01-28 ENCOUNTER — Encounter: Payer: Self-pay | Admitting: Internal Medicine

## 2020-01-28 ENCOUNTER — Ambulatory Visit (INDEPENDENT_AMBULATORY_CARE_PROVIDER_SITE_OTHER): Payer: Medicare Other | Admitting: Internal Medicine

## 2020-01-28 VITALS — BP 126/78 | HR 71 | Temp 98.8°F | Resp 16 | Ht 63.0 in | Wt 142.0 lb

## 2020-01-28 DIAGNOSIS — I10 Essential (primary) hypertension: Secondary | ICD-10-CM | POA: Diagnosis not present

## 2020-01-28 DIAGNOSIS — E538 Deficiency of other specified B group vitamins: Secondary | ICD-10-CM

## 2020-01-28 DIAGNOSIS — T502X5A Adverse effect of carbonic-anhydrase inhibitors, benzothiadiazides and other diuretics, initial encounter: Secondary | ICD-10-CM | POA: Diagnosis not present

## 2020-01-28 DIAGNOSIS — E876 Hypokalemia: Secondary | ICD-10-CM | POA: Diagnosis not present

## 2020-01-28 LAB — CBC WITH DIFFERENTIAL/PLATELET
Basophils Absolute: 0 10*3/uL (ref 0.0–0.1)
Basophils Relative: 0.5 % (ref 0.0–3.0)
Eosinophils Absolute: 0.1 10*3/uL (ref 0.0–0.7)
Eosinophils Relative: 1 % (ref 0.0–5.0)
HCT: 37.1 % (ref 36.0–46.0)
Hemoglobin: 12.6 g/dL (ref 12.0–15.0)
Lymphocytes Relative: 24.9 % (ref 12.0–46.0)
Lymphs Abs: 1.4 10*3/uL (ref 0.7–4.0)
MCHC: 33.8 g/dL (ref 30.0–36.0)
MCV: 94.1 fl (ref 78.0–100.0)
Monocytes Absolute: 0.5 10*3/uL (ref 0.1–1.0)
Monocytes Relative: 8.4 % (ref 3.0–12.0)
Neutro Abs: 3.6 10*3/uL (ref 1.4–7.7)
Neutrophils Relative %: 65.2 % (ref 43.0–77.0)
Platelets: 197 10*3/uL (ref 150.0–400.0)
RBC: 3.94 Mil/uL (ref 3.87–5.11)
RDW: 13.2 % (ref 11.5–15.5)
WBC: 5.5 10*3/uL (ref 4.0–10.5)

## 2020-01-28 LAB — BASIC METABOLIC PANEL
BUN: 15 mg/dL (ref 6–23)
CO2: 33 mEq/L — ABNORMAL HIGH (ref 19–32)
Calcium: 9.9 mg/dL (ref 8.4–10.5)
Chloride: 101 mEq/L (ref 96–112)
Creatinine, Ser: 0.73 mg/dL (ref 0.40–1.20)
GFR: 77.66 mL/min (ref >60.00)
Glucose, Bld: 96 mg/dL (ref 70–99)
Potassium: 3.4 mEq/L — ABNORMAL LOW (ref 3.5–5.1)
Sodium: 138 mEq/L (ref 135–145)

## 2020-01-28 MED ORDER — POTASSIUM CHLORIDE ER 10 MEQ PO CPCR: 10.0000 meq | ORAL_CAPSULE | Freq: Three times a day (TID) | ORAL | 1 refills | Status: DC

## 2020-01-28 NOTE — Progress Notes (Signed)
Subjective:  Patient ID: Colleen White, female    DOB: 06-10-1946  Age: 74 y.o. MRN: [id number]  CC: Hypertension  This visit occurred during the SARS-CoV-2 public health emergency.  Safety protocols were in place, including screening questions prior to the visit, additional usage of staff PPE, and extensive cleaning of exam room while observing appropriate contact time as indicated for disinfecting solutions.    HPI Ayushi Pla presents for f/up - She tells me her blood pressure has been well controlled.  She has felt well recently and offers no complaints other than that she wants to change her potassium supplement from a tablet to a capsule.  Outpatient Medications Prior to Visit  Medication Sig Dispense Refill  . Ascorbic Acid (VITAMIN C PO) Take 500 mg by mouth daily.     . brimonidine-timolol (COMBIGAN) 0.2-0.5 % ophthalmic solution Place 1 drop into both eyes every 12 (twelve) hours.     . calcium citrate (CALCITRATE - DOSED IN MG ELEMENTAL CALCIUM) 950 (200 Ca) MG tablet Take 200 mg of elemental calcium by mouth 2 (two) times daily.    . Cholecalciferol (VITAMIN D3) 1000 units CAPS Take 1,000 Units by mouth.     . Coenzyme Q10 (CO Q 10) 100 MG CAPS Take 2 tablets by mouth daily.     [name] diltiazem (CARDIZEM CD) 120 MG 24 hr capsule TAKE (1) CAPSULE DAILY. 90 capsule 3  . docusate sodium (COLACE) 100 MG capsule Take 100 mg by mouth at bedtime.    [name] esomeprazole (NEXIUM) 20 MG packet Take 20 mg by mouth daily before breakfast. Takes as needed.    . gabapentin (NEURONTIN) 100 MG capsule Take 1 capsule (100 mg total) daily by mouth. (Patient taking differently: Take 300 mg by mouth at bedtime. ) 90 capsule 0  . loratadine (CLARITIN) 10 MG tablet Take by mouth daily as needed.     [name] MINOXIDIL FOR MEN EX Apply topically. Takes as needed at night.    . mirtazapine (REMERON) 15 MG tablet Take 7.5 mg by mouth as needed. Takes 1/4 tablet at bedtime for itching of scalp    . Multiple  Vitamins-Minerals (PRESERVISION AREDS 2 PO) Take 1 tablet by mouth 2 (two) times daily.     . pravastatin (PRAVACHOL) 40 MG tablet TAKE 1 TABLET ONCE DAILY. 90 tablet 1  . Probiotic Product (PROBIOTIC DAILY PO) Take 1 tablet by mouth daily.    [name] telmisartan-hydrochlorothiazide (MICARDIS HCT) 80-12.5 MG tablet TAKE 1 TABLET ONCE DAILY. 90 tablet 1  . traMADol (ULTRAM) 50 MG tablet TAKE ONE TABLET EVERY 6 HOURS AS NEEDED. 65 tablet 3  . tretinoin (RETIN-A) 0.05 % cream Once daily     . XARELTO 20 MG TABS tablet TAKE 1 TABLET DAILY WITH SUPPER. 30 tablet 5  . Calcium Carbonate-Vitamin D (CALCIUM 500 + D PO) Take 1 tablet by mouth 2 (two) times daily.    . potassium chloride SA (KLOR-CON) 20 MEQ tablet Take 1 tablet (20 mEq total) by mouth 2 (two) times daily. 180 tablet 0   No facility-administered medications prior to visit.    ROS Review of Systems  Constitutional: Negative for appetite change, diaphoresis, fatigue and unexpected weight change.  HENT: Negative.   Eyes: Negative.   Respiratory: Negative for cough, chest tightness, shortness of breath and wheezing.   Cardiovascular: Negative for chest pain, palpitations and leg swelling.  Gastrointestinal: Negative for abdominal pain, constipation, diarrhea, nausea and vomiting.  Endocrine: Negative.   Genitourinary: Negative.  Negative for difficulty urinating.  Musculoskeletal: Negative.  Negative for arthralgias and myalgias.  Skin: Negative for color change and pallor.  Allergic/Immunologic: Negative.   Neurological: Negative.  Negative for dizziness, weakness and light-headedness.  Hematological: Negative for adenopathy. Does not bruise/bleed easily.  Psychiatric/Behavioral: Negative.     Objective:  BP 126/78 (BP Location: Left Arm, Patient Position: Sitting, Cuff Size: Large)   Pulse 71   Temp 98.8 F (37.1 C) (Oral)   Resp 16   Ht 5' 3"  (1.6 m)   Wt 142 lb (64.4 kg)   SpO2 95%   BMI 25.15 kg/m   BP Readings from Last 3  Encounters:  01/28/20 126/78  01/11/20 103/70  11/11/19 125/71    Wt Readings from Last 3 Encounters:  01/28/20 142 lb (64.4 kg)  01/11/20 142 lb (64.4 kg)  11/11/19 157 lb 6.4 oz (71.4 kg)    Physical Exam Vitals reviewed.  Constitutional:      Appearance: Normal appearance.  HENT:     Nose: Nose normal.     Mouth/Throat:     Mouth: Mucous membranes are moist.  Eyes:     General: No scleral icterus.    Conjunctiva/sclera: Conjunctivae normal.  Cardiovascular:     Rate and Rhythm: Normal rate and regular rhythm.     Heart sounds: No murmur.  Pulmonary:     Effort: Pulmonary effort is normal.     Breath sounds: No stridor. No wheezing, rhonchi or rales.  Abdominal:     General: Abdomen is flat. Bowel sounds are normal. There is no distension.     Palpations: Abdomen is soft. There is no hepatomegaly, splenomegaly or mass.     Tenderness: There is no abdominal tenderness.  Musculoskeletal:        General: Normal range of motion.     Cervical back: Neck supple.     Right lower leg: No edema.     Left lower leg: No edema.  Lymphadenopathy:     Cervical: No cervical adenopathy.  Skin:    General: Skin is warm and dry.     Coloration: Skin is not pale.  Neurological:     General: No focal deficit present.     Mental Status: She is alert.  Psychiatric:        Mood and Affect: Mood normal.        Behavior: Behavior normal.     Lab Results  Component Value Date   WBC 5.5 01/28/2020   HGB 12.6 01/28/2020   HCT 37.1 01/28/2020   PLT 197.0 01/28/2020   GLUCOSE 96 01/28/2020   CHOL 138 11/03/2019   TRIG 87.0 11/03/2019   HDL 47.00 11/03/2019   LDLDIRECT 142.5 09/30/2012   LDLCALC 73 11/03/2019   ALT 23 11/11/2019   AST 28 11/11/2019   NA 138 01/28/2020   K 3.4 (L) 01/28/2020   CL 101 01/28/2020   CREATININE 0.73 01/28/2020   BUN 15 01/28/2020   CO2 33 (H) 01/28/2020   TSH 2.14 11/03/2019   INR 1.8 (H) 11/03/2019   HGBA1C 5.8 09/10/2018    CT Angio Head  W or Wo Contrast  Addendum Date: 11/11/2019   ADDENDUM REPORT: 11/11/2019 22:34 ADDENDUM: There was a reporting error causing the findings of the CTA of the neck to be omitted. The aortic arch, both carotid systems and both vertebral arteries are normal. There is no atherosclerosis. No occlusion, dissection or stenosis. There is mild cervical degenerative facet disease without bony spinal canal stenosis.  Soft tissues of the neck are normal. The lung apices are clear. Normal CTA of the neck. This was discussed with Arlean Hopping, PA at 10:30 PM on 11/11/19. Electronically Signed   By: Ulyses Jarred M.D.   On: 11/11/2019 22:34   Result Date: 11/11/2019 CLINICAL DATA:  Headaches. History of non-Hodgkin's lymphoma. EXAM: CT ANGIOGRAPHY HEAD TECHNIQUE: Multidetector CT imaging of the head was performed using the standard protocol during bolus administration of intravenous contrast. Multiplanar CT image reconstructions and MIPs were obtained to evaluate the vascular anatomy. CONTRAST:  131m OMNIPAQUE IOHEXOL 350 MG/ML SOLN COMPARISON:  04/02/2016 FINDINGS: CT HEAD Brain: There is no mass, hemorrhage or extra-axial collection. The size and configuration of the ventricles and extra-axial CSF spaces are normal. There is hypoattenuation of the white matter, most commonly indicating chronic small vessel disease. Vascular: No abnormal hyperdensity of the major intracranial arteries or dural venous sinuses. No intracranial atherosclerosis. Skull: The visualized skull base, calvarium and extracranial soft tissues are normal. Sinuses/Orbits: No fluid levels or advanced mucosal thickening of the visualized paranasal sinuses. No mastoid or middle ear effusion. The orbits are normal. CTA HEAD POSTERIOR CIRCULATION: --Vertebral arteries: Normal V4 segments. --Posterior inferior cerebellar arteries (PICA): Patent origins from the vertebral arteries. --Anterior inferior cerebellar arteries (AICA): Patent origins from the basilar artery.  --Basilar artery: Normal. --Superior cerebellar arteries: Normal. --Posterior cerebral arteries: Normal. Both originate from the basilar artery. Posterior communicating arteries (p-comm) are diminutive or absent. ANTERIOR CIRCULATION: --Intracranial internal carotid arteries: Normal. --Anterior cerebral arteries (ACA): Normal. Both A1 segments are present. Patent anterior communicating artery (a-comm). --Middle cerebral arteries (MCA): Normal. Venous sinuses: As permitted by contrast timing, patent. Anatomic variants: None IMPRESSION: Normal intracranial CTA. Electronically Signed: By: KUlyses JarredM.D. On: 11/11/2019 19:46   CT Angio Neck W and/or Wo Contrast  Addendum Date: 11/11/2019   ADDENDUM REPORT: 11/11/2019 22:34 ADDENDUM: There was a reporting error causing the findings of the CTA of the neck to be omitted. The aortic arch, both carotid systems and both vertebral arteries are normal. There is no atherosclerosis. No occlusion, dissection or stenosis. There is mild cervical degenerative facet disease without bony spinal canal stenosis. Soft tissues of the neck are normal. The lung apices are clear. Normal CTA of the neck. This was discussed with SArlean Hopping PA at 10:30 PM on 11/11/19. Electronically Signed   By: KUlyses JarredM.D.   On: 11/11/2019 22:34   Result Date: 11/11/2019 CLINICAL DATA:  Headaches. History of non-Hodgkin's lymphoma. EXAM: CT ANGIOGRAPHY HEAD TECHNIQUE: Multidetector CT imaging of the head was performed using the standard protocol during bolus administration of intravenous contrast. Multiplanar CT image reconstructions and MIPs were obtained to evaluate the vascular anatomy. CONTRAST:  1030mOMNIPAQUE IOHEXOL 350 MG/ML SOLN COMPARISON:  04/02/2016 FINDINGS: CT HEAD Brain: There is no mass, hemorrhage or extra-axial collection. The size and configuration of the ventricles and extra-axial CSF spaces are normal. There is hypoattenuation of the white matter, most commonly indicating  chronic small vessel disease. Vascular: No abnormal hyperdensity of the major intracranial arteries or dural venous sinuses. No intracranial atherosclerosis. Skull: The visualized skull base, calvarium and extracranial soft tissues are normal. Sinuses/Orbits: No fluid levels or advanced mucosal thickening of the visualized paranasal sinuses. No mastoid or middle ear effusion. The orbits are normal. CTA HEAD POSTERIOR CIRCULATION: --Vertebral arteries: Normal V4 segments. --Posterior inferior cerebellar arteries (PICA): Patent origins from the vertebral arteries. --Anterior inferior cerebellar arteries (AICA): Patent origins from the basilar artery. --Basilar artery: Normal. --  Superior cerebellar arteries: Normal. --Posterior cerebral arteries: Normal. Both originate from the basilar artery. Posterior communicating arteries (p-comm) are diminutive or absent. ANTERIOR CIRCULATION: --Intracranial internal carotid arteries: Normal. --Anterior cerebral arteries (ACA): Normal. Both A1 segments are present. Patent anterior communicating artery (a-comm). --Middle cerebral arteries (MCA): Normal. Venous sinuses: As permitted by contrast timing, patent. Anatomic variants: None IMPRESSION: Normal intracranial CTA. Electronically Signed: By: Ulyses Jarred M.D. On: 11/11/2019 19:46    Assessment & Plan:   Darrelyn was seen today for hypertension.  Diagnoses and all orders for this visit:  Essential hypertension- Her blood pressure is adequately well controlled. -     Basic metabolic panel; Future -     CBC with Differential/Platelet; Future -     CBC with Differential/Platelet -     Basic metabolic panel  Diuretic-induced hypokalemia- Her potassium level remains low.  I will change the potassium supplement to a capsule and will increase the dose. -     Basic metabolic panel; Future -     Basic metabolic panel -     potassium chloride (MICRO-K) 10 MEQ CR capsule; Take 1 capsule (10 mEq total) by mouth with  breakfast, with lunch, and with evening meal.  B12 deficiency due to diet- Will continue B12 replacement therapy. -     CBC with Differential/Platelet; Future -     CBC with Differential/Platelet   I have discontinued Pamala Hurry Chavis's Calcium Carbonate-Vitamin D (CALCIUM 500 + D PO) and potassium chloride SA. I am also having her start on potassium chloride. Additionally, I am having her maintain her tretinoin, Multiple Vitamins-Minerals (PRESERVISION AREDS 2 PO), Probiotic Product (PROBIOTIC DAILY PO), gabapentin, brimonidine-timolol, loratadine, Vitamin D3, mirtazapine, docusate sodium, esomeprazole, Co Q 10, diltiazem, Ascorbic Acid (VITAMIN C PO), Xarelto, calcium citrate, pravastatin, telmisartan-hydrochlorothiazide, traMADol, and MINOXIDIL FOR MEN EX.  Meds ordered this encounter  Medications  . potassium chloride (MICRO-K) 10 MEQ CR capsule    Sig: Take 1 capsule (10 mEq total) by mouth with breakfast, with lunch, and with evening meal.    Dispense:  270 capsule    Refill:  1     Follow-up: Return in about 6 months (around 07/29/2020).  Scarlette Calico, MD

## 2020-01-28 NOTE — Patient Instructions (Signed)

## 2020-02-03 DIAGNOSIS — R519 Headache, unspecified: Secondary | ICD-10-CM | POA: Diagnosis not present

## 2020-02-04 ENCOUNTER — Telehealth: Payer: Self-pay | Admitting: Neurology

## 2020-02-04 NOTE — Telephone Encounter (Signed)
MRI of the brain is normal for age, wonderful news! thanks

## 2020-02-08 NOTE — Telephone Encounter (Signed)
Called pt & LVM (ok per DPR) advising MRI brain is normal for age, wonderful news per Dr. Jaynee Eagles. Left office number for call back if she has any questions.

## 2020-02-23 ENCOUNTER — Telehealth: Payer: Self-pay

## 2020-02-23 DIAGNOSIS — D539 Nutritional anemia, unspecified: Secondary | ICD-10-CM

## 2020-02-23 NOTE — Telephone Encounter (Signed)
Hemoccult card.   Call pt to discuss

## 2020-02-25 NOTE — Telephone Encounter (Signed)
What dx for the hemmocult cards?

## 2020-02-26 NOTE — Telephone Encounter (Signed)
F/u  The patient will come by the office on Tuesday to pick up kit.

## 2020-02-26 NOTE — Telephone Encounter (Signed)
LVM for pt to call back.   I have the kits that she was requesting. I need to know if I am mailing the kit to her or would she like to pick up the kit.

## 2020-03-01 ENCOUNTER — Other Ambulatory Visit (HOSPITAL_COMMUNITY): Payer: Self-pay | Admitting: Cardiology

## 2020-03-01 NOTE — Telephone Encounter (Signed)
I have the kit on my desk.

## 2020-03-02 NOTE — Telephone Encounter (Signed)
Pt came in, reviewed the new kits that we have. Printed labels for pt and gave a specimen bag to use if needed. Pt stated understanding and will bring specimen once she has collect. Order has been entered.

## 2020-03-17 ENCOUNTER — Other Ambulatory Visit: Payer: Medicare Other

## 2020-03-21 LAB — FECAL OCCULT BLOOD, IMMUNOCHEMICAL

## 2020-03-28 ENCOUNTER — Other Ambulatory Visit: Payer: Medicare Other

## 2020-03-28 NOTE — Addendum Note (Signed)
Addended by: Cresenciano Lick on: 03/28/2020 01:14 PM   Modules accepted: Orders

## 2020-03-29 LAB — FECAL OCCULT BLOOD, IMMUNOCHEMICAL

## 2020-03-29 LAB — SPECIMEN STATUS REPORT

## 2020-04-06 ENCOUNTER — Telehealth: Payer: Self-pay | Admitting: Internal Medicine

## 2020-04-06 DIAGNOSIS — D539 Nutritional anemia, unspecified: Secondary | ICD-10-CM

## 2020-04-06 NOTE — Telephone Encounter (Signed)
    Per Dalene Seltzer at [facility], she would like a return call from [facility]  Please call [phone] Dalene Seltzer states the patient has been calling the lab but they are unable to discuss certain information with patient, wanting to give CMA information

## 2020-04-06 NOTE — Telephone Encounter (Signed)
I called the customer service at [facility]- I spoke to Tracy. She states the [employer] OC sampling bottle was never received. I informed Tracy that I personally watched the patient place the [employer] OC sampling bottle in the envelope and seal it myself.  I then placed the specimen in a [facility] bag with the requisition and placed it in the lock box for the 6:00 courier. She adamantly states that a specimen was received in a sterile bottle with a green lid. That is all she could say.  Patient and PCP informed of this. Patient would like testing to be done with [facility]. I gave her a [facility] test kit and the order placed for [facility].  

## 2020-04-18 ENCOUNTER — Telehealth: Payer: Self-pay | Admitting: Gastroenterology

## 2020-04-18 DIAGNOSIS — R350 Frequency of micturition: Secondary | ICD-10-CM | POA: Diagnosis not present

## 2020-04-18 DIAGNOSIS — R102 Pelvic and perineal pain: Secondary | ICD-10-CM | POA: Diagnosis not present

## 2020-04-18 DIAGNOSIS — D539 Nutritional anemia, unspecified: Secondary | ICD-10-CM | POA: Diagnosis not present

## 2020-04-18 DIAGNOSIS — R194 Change in bowel habit: Secondary | ICD-10-CM | POA: Diagnosis not present

## 2020-04-18 NOTE — Telephone Encounter (Signed)
Patient reports bloating, diarrhea, cramping.  She saw her GYN and they encouraged her to see GI.  Patient has been scheduled for 05/11/20 2:10

## 2020-04-19 LAB — FECAL GLOBIN BY IMMUNOCHEMISTRY: FECAL GLOBIN RESULT:: NOT DETECTED

## 2020-04-21 ENCOUNTER — Other Ambulatory Visit: Payer: Self-pay | Admitting: Internal Medicine

## 2020-04-21 DIAGNOSIS — M159 Polyosteoarthritis, unspecified: Secondary | ICD-10-CM

## 2020-04-21 DIAGNOSIS — M8949 Other hypertrophic osteoarthropathy, multiple sites: Secondary | ICD-10-CM

## 2020-05-05 DIAGNOSIS — L299 Pruritus, unspecified: Secondary | ICD-10-CM | POA: Diagnosis not present

## 2020-05-05 DIAGNOSIS — R208 Other disturbances of skin sensation: Secondary | ICD-10-CM | POA: Diagnosis not present

## 2020-05-05 DIAGNOSIS — Z79899 Other long term (current) drug therapy: Secondary | ICD-10-CM | POA: Diagnosis not present

## 2020-05-05 DIAGNOSIS — L219 Seborrheic dermatitis, unspecified: Secondary | ICD-10-CM | POA: Diagnosis not present

## 2020-05-05 DIAGNOSIS — L658 Other specified nonscarring hair loss: Secondary | ICD-10-CM | POA: Diagnosis not present

## 2020-05-10 DIAGNOSIS — R918 Other nonspecific abnormal finding of lung field: Secondary | ICD-10-CM | POA: Diagnosis not present

## 2020-05-10 DIAGNOSIS — C8290 Follicular lymphoma, unspecified, unspecified site: Secondary | ICD-10-CM | POA: Diagnosis not present

## 2020-05-10 DIAGNOSIS — M79604 Pain in right leg: Secondary | ICD-10-CM | POA: Diagnosis not present

## 2020-05-11 ENCOUNTER — Ambulatory Visit (INDEPENDENT_AMBULATORY_CARE_PROVIDER_SITE_OTHER): Payer: Medicare Other | Admitting: Gastroenterology

## 2020-05-11 ENCOUNTER — Encounter: Payer: Self-pay | Admitting: Gastroenterology

## 2020-05-11 VITALS — BP 106/60 | HR 68 | Ht 63.0 in | Wt 153.0 lb

## 2020-05-11 DIAGNOSIS — Z1212 Encounter for screening for malignant neoplasm of rectum: Secondary | ICD-10-CM

## 2020-05-11 DIAGNOSIS — R102 Pelvic and perineal pain: Secondary | ICD-10-CM

## 2020-05-11 DIAGNOSIS — Z1211 Encounter for screening for malignant neoplasm of colon: Secondary | ICD-10-CM | POA: Diagnosis not present

## 2020-05-11 NOTE — Progress Notes (Signed)
    History of Present Illness: This is a 74 year old female with suprapubic discomfort, gas and looser stools that occurred for several days about 6 weeks ago.  She was evaluated by her gynecologist and a GYN cause and a UTI were excluded.  She was advised by her gynecologist to discontinue lactose containing products and begin Metamucil. Her symptoms promptly and completely completely abated.  Over the years she has had intermittent problems with lower abdominal pain and looser stools.  Generally these symptoms respond well to dicyclomine prn.  CT AP in 04/2018 showed diverticulosis and hepatic steatosis.  Recent iFOBT was negative. Colonoscopy in 2012 was somewhat difficult with a prolonged insertion time.  Mild angulation and muscular hypertrophy in the sigmoid colon at 25 cm from the anal verge, small and large diverticula noted in the sigmoid colon, a 4 mm hyperplastic polyp was removed from the ascending colon.  Terminal ileum was normal.  No other findings noted. The total procedure time was 38 minutes 11 seconds with the scope withdrawal time of 13 minutes 47 seconds.    Current Medications, Allergies, Past Medical History, Past Surgical History, Family History and Social History were reviewed in [employer] record.   Physical Exam: General: Well developed, well nourished, no acute distress Head: Normocephalic and atraumatic Eyes:  sclerae anicteric, EOMI Ears: Normal auditory acuity Mouth: Not examined, mask on during Covid-19 pandemic Lungs: Clear throughout to auscultation Heart: Regular rate and rhythm; no murmurs, rubs or bruits Abdomen: Soft, non tender and non distended. No masses, hepatosplenomegaly or hernias noted. Normal Bowel sounds Rectal: Not done Musculoskeletal: Symmetrical with no gross deformities  Pulses:  Normal pulses noted Extremities: No clubbing, cyanosis, edema or deformities noted Neurological: Alert oriented x 4, grossly  nonfocal Psychological:  Alert and cooperative. Normal mood and affect   Assessment and Recommendations:  1.  Mild suprapubic pain and loose stools.  Lactose intolerance and mild IBS.  Continue lactose avoidance, Metamucil daily, dicyclomine 10 mg 3 times daily as needed.  2. CRC screening, average risk.  Last colonoscopy performed at [facility] in January 2012 was somewhat difficult. She does not have a history of precancerous colon polyps and she is now 74 so it is reasonable to discontinue screening.  If she wishes to continue screening her screening would be due in January 2022.  I recommended considering Cologuard, CT colonoscopy or an ACBE as opposed to colonoscopy given her last colonoscopy was somewhat difficult.  At this point she is interested in CT colonoscopy.

## 2020-05-11 NOTE — Patient Instructions (Signed)
Follow up as needed.   Thank you for choosing me and [facility].  Pricilla Riffle. Dagoberto Ligas., MD., Marval Regal

## 2020-05-18 ENCOUNTER — Ambulatory Visit: Payer: Medicare Other | Admitting: Pharmacist

## 2020-05-18 ENCOUNTER — Other Ambulatory Visit: Payer: Self-pay

## 2020-05-18 DIAGNOSIS — E7800 Pure hypercholesterolemia, unspecified: Secondary | ICD-10-CM

## 2020-05-18 DIAGNOSIS — K219 Gastro-esophageal reflux disease without esophagitis: Secondary | ICD-10-CM

## 2020-05-18 DIAGNOSIS — I480 Paroxysmal atrial fibrillation: Secondary | ICD-10-CM

## 2020-05-18 DIAGNOSIS — I10 Essential (primary) hypertension: Secondary | ICD-10-CM

## 2020-05-18 NOTE — Chronic Care Management (AMB) (Signed)
Chronic Care Management Pharmacy  Name: Colleen White  MRN: [id number] DOB: June 07, 1946 Chief Complaint/ HPI  Colleen White,  74 y.o. , female presents for their Follow-Up CCM visit with the clinical pharmacist via telephone due to COVID-19 Pandemic.  PCP : Janith Lima, MD  Their chronic conditions include: HTN, HLD, Afib, OSA, allergies, GERD, NASH, lymphoma (remission 2012), depression  Patient tripped over short wall and fell into shrubbery Labor Day weekend.   Office Visits: 01/28/20 Dr Ronnald Ramp OV: change potassium to capsule and increased dose d/t low K (10 meq TID)  11/03/19 Dr Ronnald Ramp OV: BP controlled, hypokalemic d/t thiazide diuretic. Start KCL supplement. NASH, HLD, Afib controlled, no other med changes.  04/20/19 Dr Ronnald Ramp OV: urinary frequency, microscopic hematuria, urine culture negative. May consider treating OAB in future.  Consult Visit:  05/11/20 Dr Fuller Plan (GI): evaluation for abdominal pain/bloating. Advised Metamucil daily, dicyclomine 10 mg TID prn.   01/11/20 Dr Jaynee Eagles (neurology): evaluation for headache, check MRI to r/o embolic strokes or lesions. MRI was normal for age.  11/11/19 ED visit: c/o headache, numbness. No neurologic deficits on exam, CT negative for acute abnormalities.   10/29/19 Dr Lonna Cobb (dermatology [facility]): scalp dysesthesia and full body exam. Mirtazapine helps with itchy scalp.   08/06/19 Dr Laverta [facility] oncology): fibrocystic breast changes, family hx of breast cancer and personal history of nodular lymphoma. Monitored with 2 mammograms per year and MRI once a year.  07/20/19 Dr Valetta Close (ophthalmology): via claims.  07/17/19 Dr Marigene Ehlers (cardiology H&V): Afib - controlled via CCB, DOAC and CPAP. No med changes.  No Known Allergies  Medications: Outpatient Encounter Medications as of 05/18/2020  Medication Sig  . Ascorbic Acid (VITAMIN C PO) Take 500 mg by mouth daily.   . brimonidine-timolol (COMBIGAN) 0.2-0.5 % ophthalmic solution  Place 1 drop into both eyes every 12 (twelve) hours.   . calcium citrate (CALCITRATE - DOSED IN MG ELEMENTAL CALCIUM) 950 (200 Ca) MG tablet Take 200 mg of elemental calcium by mouth 2 (two) times daily.  . Cholecalciferol (VITAMIN D3) 1000 units CAPS Take 1,000 Units by mouth.   . Coenzyme Q10 (CO Q 10) 100 MG CAPS Take 2 tablets by mouth daily.   [name] dicyclomine (BENTYL) 10 MG capsule Take 10 mg by mouth 3 (three) times daily as needed for spasms.  [name] diltiazem (CARDIZEM CD) 120 MG 24 hr capsule TAKE (1) CAPSULE DAILY.  [name] docusate sodium (COLACE) 100 MG capsule Take 100 mg by mouth at bedtime.  [name] esomeprazole (NEXIUM) 20 MG packet Take 20 mg by mouth daily before breakfast. Takes as needed.  . gabapentin (NEURONTIN) 100 MG capsule Take 1 capsule (100 mg total) daily by mouth. (Patient taking differently: Take 300 mg by mouth at bedtime. )  . loratadine (CLARITIN) 10 MG tablet Take by mouth daily as needed.   [name] MINOXIDIL FOR MEN EX Apply topically. Takes as needed at night.  . mirtazapine (REMERON) 15 MG tablet Take 7.5 mg by mouth as needed. Takes 1/4 tablet at bedtime for itching of scalp  . Multiple Vitamins-Minerals (PRESERVISION AREDS 2 PO) Take 1 tablet by mouth 2 (two) times daily.   . potassium chloride (MICRO-K) 10 MEQ CR capsule Take 1 capsule (10 mEq total) by mouth with breakfast, with lunch, and with evening meal.  . pravastatin (PRAVACHOL) 40 MG tablet TAKE 1 TABLET ONCE DAILY.  . Probiotic Product (PROBIOTIC DAILY PO) Take 1 tablet by mouth daily.  [name] telmisartan-hydrochlorothiazide (MICARDIS HCT) 80-12.5 MG tablet TAKE  1 TABLET ONCE DAILY.  . traMADol (ULTRAM) 50 MG tablet TAKE ONE TABLET EVERY 6 HOURS AS NEEDED.  [name] tretinoin (RETIN-A) 0.05 % cream Once daily   . XARELTO 20 MG TABS tablet TAKE 1 TABLET DAILY WITH SUPPER.   No facility-administered encounter medications on file as of 05/18/2020.   Wt Readings from Last 3 Encounters:  05/11/20 153 lb (69.4 kg)  01/28/20 142 lb  (64.4 kg)  01/11/20 142 lb (64.4 kg)    Current Diagnosis/Assessment: SDOH Interventions     Most Recent Value  SDOH Interventions  Financial Strain Interventions Intervention Not Indicated      Goals Addressed            This Visit's Progress   . Pharmacy Care Plan       CARE PLAN ENTRY (see longitudinal plan of care for additional care plan information)  Current Barriers:  . Chronic Disease Management support, education, and care coordination needs related to Hypertension, Hyperlipidemia, Atrial Fibrillation, and GERD   Hypertension BP Readings from Last 3 Encounters:  05/11/20 106/60  01/28/20 126/78  01/11/20 103/70 .  Pharmacist Clinical Goal(s): o Over the next 180 days, patient will work with PharmD and providers to maintain BP goal <130/80 . Current regimen:  o diltiazem 120 mg daily,  o telmisartan/HCTZ 80-12.5 mg daily,  o potassium 10 mEq 3 times daily . Interventions: o Discussed BP goals and benefits of medications for prevention of heart attack / stroke . Patient self care activities - Over the next 180 days, patient will: o Check BP as needed, document, and provide at future appointments o Ensure daily salt intake < 2300 mg/day  Hyperlipidemia Lab Results  Component Value Date/Time   LDLCALC 73 11/03/2019 01:09 PM   LDLDIRECT 142.5 09/30/2012 11:10 AM .  Pharmacist Clinical Goal(s): o Over the next 180 days, patient will work with PharmD and providers to maintain LDL goal < 100 . Current regimen:  o pravastatin 40 mg daily,  o CoQ10 . Interventions: o Discussed cholesterol goals and benefits of medications for prevention of heart attack / stroke . Patient self care activities - Over the next 180 days, patient will: o Continue current medications  AFIB . Pharmacist Clinical Goal(s) o Over the next 180 days, patient will work with PharmD and providers to optimize therapy . Current regimen:  o diltiazem 120 mg daily o Xarelto 20 mg daily   . Interventions: o Discussed benefits of medications for stroke prevention as well as bleeding risk associated with Xarelto o Advised to avoid NSAIDs due to bleeding risk with Xarelto . Patient self care activities - Over the next 180 days, patient will: o Continue current medications o Avoid NSAIDs  GERD . Pharmacist Clinical Goal(s) o Over the next 180 days, patient will work with PharmD and providers to optimize therapy . Current regimen:  o Esomeprazole 20 mg packet daily at bedtime . Interventions: o Discussed optimal timing of PPI . Patient self care activities - Over the next 180 days, patient will: o Continue medication as directed  Medication management . Pharmacist Clinical Goal(s): o Over the next 180 days, patient will work with PharmD and providers to maintain optimal medication adherence . [facility] pharmacy[facility] . Interventions o Comprehensive medication review performed. o Continue current medication management strategy . Patient self care activities - Over the next 180 days, patient will: o Focus on medication adherence by pill box o Take medications as prescribed o Report any questions or concerns  to PharmD and/or provider(s)  Please see past updates related to this goal by clicking on the "Past Updates" button in the selected goal        AFIB   Patient is currently rate controlled. Pulse Readings from Last 3 Encounters:  05/11/20 68  01/28/20 71  01/11/20 63   Kidney Function Lab Results  Component Value Date/Time   CREATININE 0.73 01/28/2020 02:06 PM   CREATININE 0.75 11/11/2019 06:17 PM   CREATININE 0.70 10/27/2015 09:58 AM   GFR 77.66 01/28/2020 02:06 PM   GFRNONAA >60 11/11/2019 06:17 PM   GFRAA >60 11/11/2019 06:17 PM   K 3.4 (L) 01/28/2020 02:06 PM   K 3.6 11/11/2019 06:17 PM   CHA2DS2-VASc Score = 4  The patient's score is based upon: CHF History: 0 HTN History: 1 Age : 2 Diabetes History: 0 Stroke History:  0 Vascular Disease History: 0 Gender: 1  { Patient has failed these meds in past: metoprolol (swelling) Patient is currently controlled on the following medications:   Diltiazem 120 mg daily (can take extra when HR elevated),   Xarelto 20 mg daily   We discussed: benefits of rate control and anticoagulation. Pt denies issues with bruising or bleeding. Recent MRI was normal for age, no evidence of embolic strokes.  Plan  Continue current medications   Hypertension   BP goal< 130/80  Office blood pressures are  BP Readings from Last 3 Encounters:  05/11/20 106/60  01/28/20 126/78  01/11/20 103/70   Kidney Function Lab Results  Component Value Date/Time   CREATININE 0.73 01/28/2020 02:06 PM   CREATININE 0.75 11/11/2019 06:17 PM   CREATININE 0.70 10/27/2015 09:58 AM   GFR 77.66 01/28/2020 02:06 PM   GFRNONAA >60 11/11/2019 06:17 PM   GFRAA >60 11/11/2019 06:17 PM   K 3.4 (L) 01/28/2020 02:06 PM   K 3.6 11/11/2019 06:17 PM   Patient has failed these meds in the past: amlodipine Patient is currently controlled on the following medications:   Diltiazem 120 mg daily,   Telmisartan/HCTZ 80-12.5 mg daily,   Potassium 10 meq TID  Patient checks BP at home infrequently  We discussed BP goals, side effects of diuretic including low potassium. Potassium recently switched to capsules and dose increased due to low K.  Plan  Continue current medications and control with diet and exercise   Hyperlipidemia   LDL goal < 100  Lipid Panel     Component Value Date/Time   CHOL 138 11/03/2019 1309   TRIG 87.0 11/03/2019 1309   HDL 47.00 11/03/2019 1309   CHOLHDL 3 11/03/2019 1309   VLDL 17.4 11/03/2019 1309   LDLCALC 73 11/03/2019 1309   LDLDIRECT 142.5 09/30/2012 1110   The 10-year ASCVD risk score Mikey Bussing DC Jr., et al., 2013) is: 14.4%   Values used to calculate the score:     Age: 89 years     Sex: Female     Is Non-Hispanic African American: No     Diabetic:  No     Tobacco smoker: No     Systolic Blood Pressure: 099 mmHg     Is BP treated: Yes     HDL Cholesterol: 47 mg/dL     Total Cholesterol: 138 mg/dL   Patient has failed these meds in past: atorvastatin (myalgias) Patient is currently controlled on the following medications:   pravastatin 40 mg daily,   CoQ10  We discussed:  Cholesterol goals, benefits of CoQ10 with statins. Pt reports she was on  atorvastatin before and may have had muscle cramps, she denies issues with pravastatin.    Plan  Continue current medications   Scalp dysesthesia/Hair loss   Patient has failed these meds in past: n/a Patient is currently controlled on the following medications:   mirtazapine 1.875 mg HS (1/8 of 15 mg - pt cuts in 1/2, then cuts the 1/2 tab in quarters and takes a few times per month),   gabapentin 100 mg - 2 tab HS  minoxidil 5% solution  tretinoin 0.05% cream to face  We discussed: pt sees a dermatologist who prescribes gabapentin and mirtazapine for scalp dysesthesia. Pt takes as above and per pt, the prescriber is aware she takes them this way. Pt reports her condition is under control this way.  Plan  Continue current medications    GERD/GI   Patient has failed these meds in past: n/a Patient is currently controlled on the following medications:   Esomeprazole 20 mg packet daily HS  Docusate 100 mg HS  Probiotic   Dicyclomine 10 mg TID prn  We discussed:   Pt reports increase in reflux recently, especially at night so she moved PPI to bedtime and reports improvement.  Plan  Continue current medications   Vaccines   Reviewed and discussed patient's vaccination history.    Immunization History  Administered Date(s) Administered  . Fluad Quad(high Dose 65+) 04/20/2019  . Influenza Split 09/05/2011, 07/30/2012  . Influenza, High Dose Seasonal PF 10/11/2015, 09/11/2016, 06/25/2017, 06/19/2018  . Influenza,inj,Quad PF,6+ Mos 08/26/2014  .  Influenza-Unspecified 06/27/2013  . PFIZER SARS-COV-2 Vaccination 09/12/2019, 10/03/2019  . Pneumococcal Conjugate-13 12/15/2014  . Pneumococcal Polysaccharide-23 12/21/2015  . Tdap 09/30/2012  . Zoster 09/30/2009  . Zoster Recombinat (Shingrix) 09/03/2017, 11/04/2017   We discussed COVID booster vaccine, currently an FDA advisory panel has recommended booster dose for [place] for patients over 65.  Plan  Vaccines up to date.  Medication Management   Pt uses [facility] for all medications Does not use pill box - sets up pill bottles for AM and PM locations Pt endorses 99% compliance  We discussed:  Pt's husband is a Software engineer who owned [employer] for 40+ years, she denies issues with med refills and endorses good compliance.  Plan  Continue current med management stategy     Follow up: 6 month phone visit  Charlene Brooke, PharmD, [facility] Clinical Pharmacist [name] [phone]

## 2020-05-18 NOTE — Patient Instructions (Addendum)
Visit Information  Phone number for Pharmacist: [phone]  Goals Addressed            This Visit's Progress   . Pharmacy Care Plan       CARE PLAN ENTRY (see longitudinal plan of care for additional care plan information)  Current Barriers:  . Chronic Disease Management support, education, and care coordination needs related to Hypertension, Hyperlipidemia, Atrial Fibrillation, and GERD   Hypertension BP Readings from Last 3 Encounters:  05/11/20 106/60  01/28/20 126/78  01/11/20 103/70 .  Pharmacist Clinical Goal(s): o Over the next 180 days, patient will work with PharmD and providers to maintain BP goal <130/80 . Current regimen:  o diltiazem 120 mg daily,  o telmisartan/HCTZ 80-12.5 mg daily,  o potassium 10 mEq 3 times daily . Interventions: o Discussed BP goals and benefits of medications for prevention of heart attack / stroke . Patient self care activities - Over the next 180 days, patient will: o Check BP as needed, document, and provide at future appointments o Ensure daily salt intake < 2300 mg/day  Hyperlipidemia Lab Results  Component Value Date/Time   LDLCALC 73 11/03/2019 01:09 PM   LDLDIRECT 142.5 09/30/2012 11:10 AM .  Pharmacist Clinical Goal(s): o Over the next 180 days, patient will work with PharmD and providers to maintain LDL goal < 100 . Current regimen:  o pravastatin 40 mg daily,  o CoQ10 . Interventions: o Discussed cholesterol goals and benefits of medications for prevention of heart attack / stroke . Patient self care activities - Over the next 180 days, patient will: o Continue current medications  AFIB . Pharmacist Clinical Goal(s) o Over the next 180 days, patient will work with PharmD and providers to optimize therapy . Current regimen:  o diltiazem 120 mg daily o Xarelto 20 mg daily  . Interventions: o Discussed benefits of medications for stroke prevention as well as bleeding risk associated with Xarelto o Advised to avoid  NSAIDs due to bleeding risk with Xarelto . Patient self care activities - Over the next 180 days, patient will: o Continue current medications o Avoid NSAIDs  GERD . Pharmacist Clinical Goal(s) o Over the next 180 days, patient will work with PharmD and providers to optimize therapy . Current regimen:  o Esomeprazole 20 mg packet daily at bedtime . Interventions: o Discussed optimal timing of PPI . Patient self care activities - Over the next 180 days, patient will: o Continue medication as directed  Medication management . Pharmacist Clinical Goal(s): o Over the next 180 days, patient will work with PharmD and providers to maintain optimal medication adherence . Current pharmacy: [facility] . Interventions o Comprehensive medication review performed. o Continue current medication management strategy . Patient self care activities - Over the next 180 days, patient will: o Focus on medication adherence by pill box o Take medications as prescribed o Report any questions or concerns to PharmD and/or provider(s)  Please see past updates related to this goal by clicking on the "Past Updates" button in the selected goal       Patient verbalizes understanding of instructions provided today.  Telephone follow up appointment with pharmacy team member scheduled for: 6 months  Charlene Brooke, PharmD, BCACP Clinical Pharmacist [facility] [place] Maintenance for Postmenopausal Women Menopause is a normal process in which your ability to get pregnant comes to an end. This process happens slowly over many months or years, usually between the ages of  48 and 55. Menopause is complete when you have missed your menstrual periods for 12 months. It is important to talk with your health care provider about some of the most common conditions that affect women after menopause (postmenopausal women). These include heart disease, cancer, and bone loss  (osteoporosis). Adopting a healthy lifestyle and getting preventive care can help to promote your health and wellness. The actions you take can also lower your chances of developing some of these common conditions. What should I know about menopause? During menopause, you may get a number of symptoms, such as:  Hot flashes. These can be moderate or severe.  Night sweats.  Decrease in sex drive.  Mood swings.  Headaches.  Tiredness.  Irritability.  Memory problems.  Insomnia. Choosing to treat or not to treat these symptoms is a decision that you make with your health care provider. Do I need hormone replacement therapy?  Hormone replacement therapy is effective in treating symptoms that are caused by menopause, such as hot flashes and night sweats.  Hormone replacement carries certain risks, especially as you become older. If you are thinking about using estrogen or estrogen with progestin, discuss the benefits and risks with your health care provider. What is my risk for heart disease and stroke? The risk of heart disease, heart attack, and stroke increases as you age. One of the causes may be a change in the body's hormones during menopause. This can affect how your body uses dietary fats, triglycerides, and cholesterol. Heart attack and stroke are medical emergencies. There are many things that you can do to help prevent heart disease and stroke. Watch your blood pressure  High blood pressure causes heart disease and increases the risk of stroke. This is more likely to develop in people who have high blood pressure readings, are of African descent, or are overweight.  Have your blood pressure checked: ? Every 3-5 years if you are 38-58 years of age. ? Every year if you are 29 years old or older. Eat a healthy diet   Eat a diet that includes plenty of vegetables, fruits, low-fat dairy products, and lean protein.  Do not eat a lot of foods that are high in solid fats, added  sugars, or sodium. Get regular exercise Get regular exercise. This is one of the most important things you can do for your health. Most adults should:  Try to exercise for at least 150 minutes each week. The exercise should increase your heart rate and make you sweat (moderate-intensity exercise).  Try to do strengthening exercises at least twice each week. Do these in addition to the moderate-intensity exercise.  Spend less time sitting. Even light physical activity can be beneficial. Other tips  Work with your health care provider to achieve or maintain a healthy weight.  Do not use any products that contain nicotine or tobacco, such as cigarettes, e-cigarettes, and chewing tobacco. If you need help quitting, ask your health care provider.  Know your numbers. Ask your health care provider to check your cholesterol and your blood sugar (glucose). Continue to have your blood tested as directed by your health care provider. Do I need screening for cancer? Depending on your health history and family history, you may need to have cancer screening at different stages of your life. This may include screening for:  Breast cancer.  Cervical cancer.  Lung cancer.  Colorectal cancer. What is my risk for osteoporosis? After menopause, you may be at increased risk for osteoporosis. Osteoporosis is  a condition in which bone destruction happens more quickly than new bone creation. To help prevent osteoporosis or the bone fractures that can happen because of osteoporosis, you may take the following actions:  If you are 64-30 years old, get at least 1,000 mg of calcium and at least 600 mg of vitamin D per day.  If you are older than age 59 but younger than age 27, get at least 1,200 mg of calcium and at least 600 mg of vitamin D per day.  If you are older than age 33, get at least 1,200 mg of calcium and at least 800 mg of vitamin D per day. Smoking and drinking excessive alcohol increase the risk  of osteoporosis. Eat foods that are rich in calcium and vitamin D, and do weight-bearing exercises several times each week as directed by your health care provider. How does menopause affect my mental health? Depression may occur at any age, but it is more common as you become older. Common symptoms of depression include:  Low or sad mood.  Changes in sleep patterns.  Changes in appetite or eating patterns.  Feeling an overall lack of motivation or enjoyment of activities that you previously enjoyed.  Frequent crying spells. Talk with your health care provider if you think that you are experiencing depression. General instructions See your health care provider for regular wellness exams and vaccines. This may include:  Scheduling regular health, dental, and eye exams.  Getting and maintaining your vaccines. These include: ? Influenza vaccine. Get this vaccine each year before the flu season begins. ? Pneumonia vaccine. ? Shingles vaccine. ? Tetanus, diphtheria, and pertussis (Tdap) booster vaccine. Your health care provider may also recommend other immunizations. Tell your health care provider if you have ever been abused or do not feel safe at home. Summary  Menopause is a normal process in which your ability to get pregnant comes to an end.  This condition causes hot flashes, night sweats, decreased interest in sex, mood swings, headaches, or lack of sleep.  Treatment for this condition may include hormone replacement therapy.  Take actions to keep yourself healthy, including exercising regularly, eating a healthy diet, watching your weight, and checking your blood pressure and blood sugar levels.  Get screened for cancer and depression. Make sure that you are up to date with all your vaccines. This information is not intended to replace advice given to you by your health care provider. Make sure you discuss any questions you have with your health care provider. Document  Revised: 08/06/2018 Document Reviewed: 08/06/2018 Elsevier Patient Education  2020 [employer].

## 2020-05-19 ENCOUNTER — Other Ambulatory Visit: Payer: Self-pay | Admitting: Internal Medicine

## 2020-05-19 DIAGNOSIS — E7800 Pure hypercholesterolemia, unspecified: Secondary | ICD-10-CM

## 2020-05-24 ENCOUNTER — Ambulatory Visit: Payer: Medicare Other

## 2020-06-13 DIAGNOSIS — Z23 Encounter for immunization: Secondary | ICD-10-CM | POA: Diagnosis not present

## 2020-06-30 ENCOUNTER — Other Ambulatory Visit: Payer: Self-pay | Admitting: Internal Medicine

## 2020-06-30 DIAGNOSIS — I10 Essential (primary) hypertension: Secondary | ICD-10-CM

## 2020-07-10 NOTE — Progress Notes (Signed)
Virtual Visit via Telephone Note   This visit type was conducted due to national recommendations for restrictions regarding the COVID-19 Pandemic (e.g. social distancing) in an effort to limit this patient's exposure and mitigate transmission in our community.  Due to her co-morbid illnesses, this patient is at least at moderate risk for complications without adequate follow up.  This format is felt to be most appropriate for this patient at this time.  All issues noted in this document were discussed and addressed.  A limited physical exam was performed with this format.  Please refer to the patient's chart for her consent to telehealth for Colleen White.   Evaluation Performed:  Follow-up visit  This visit type was conducted due to national recommendations for restrictions regarding the COVID-19 Pandemic (e.g. social distancing).  This format is felt to be most appropriate for this patient at this time.  All issues noted in this document were discussed and addressed.  No physical exam was performed (except for noted visual exam findings with Video Visits).  Please refer to the patient's chart (MyChart message for video visits and phone note for telephone visits) for the patient's consent to telehealth for [facility].  Date:  07/11/2020   ID:  Verbena White, DOB October 01, 1945, MRN [id number]  Patient Location:  Home  Provider location:   [place]  PCP:  Janith Lima, MD  Cardiologist:  [name], MD Sleep Medicine:  Fransico Him, MD Electrophysiologist:  None   Chief Complaint:  OSA  History of Present Illness:    Colleen White is a 74 y.o. female who presents via audio/video conferencing for a telehealth visit today.    Colleen White is a 74 y.o. female with a hx of mild OSA with an AHI of 9.1/hrand is onCPAPat7cm H2O. She is doing well with her CPAP device and thinks that she has gotten used to it.  She tolerates the full faceask and feels the pressure is  adequate.  Since going on CPAP she feels rested in the am and has no significant daytime sleepiness.  She does have some problems with dry mouth but no nasal dryness or nasal congestion.  She does not think that he snores. She has not had any problems with palpitaitons.    The patient does not have symptoms concerning for COVID-19 infection (fever, chills, cough, or new shortness of breath).   Prior CV studies:   The following studies were reviewed today:  PAP compliance download  Past Medical History:  Diagnosis Date  . Allergic rhinitis, cause unspecified   . Alopecia   . Anxiety   . Colon polyps   . Diverticulosis of colon (without mention of hemorrhage)   . Fibrocystic breast disease   . GERD (gastroesophageal reflux disease)   . Headache(784.0)   . Hemorrhoids   . History of echocardiogram    Echo 3/17:  Vigorous LVF, EF 65-70%, normal wall motion, grade 1 diastolic, mildly elevated LVOT velocity of 2.2 m/s-likely related to vigorous LVF  . History of stress test    Myoview normal in 2/05. //  Stress echo (11/15) with no evidence for ischemia or infarction.  Colleen White HLD (hyperlipidemia)   . HTN (hypertension)   . Hypertension   . Mitral valve disorders(424.0)   . Non Hodgkin's lymphoma ([place])    Treated at [facility] with surgery and XRT.  Did not have chemotherapy.   . OSA (obstructive sleep apnea) 03/29/2016  . Osteopenia   . PAF (paroxysmal atrial fibrillation) ([place])  a. CHADS2-VASc=3 //  b. Xarelto started in 2017  . Pulmonary nodule    Presumed benign after observation.   . Sleep apnea   . Unspecified glaucoma(365.9)    Past Surgical History:  Procedure Laterality Date  . ABDOMINAL HYSTERECTOMY  1996  . BASAL CELL CARCINOMA EXCISION     06/2017  . BREAST CYST ASPIRATION  2017  . dental implant  12/21/2019   #31  . EYE SURGERY  2009   Cataract  . 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 Cloyd Stagers, [facility]  . s/p ORIF prox phalanx  02/2007   right  little finger----Dr Fredna Dow     Current Meds  Medication Sig  . Ascorbic Acid (VITAMIN C PO) Take 500 mg by mouth daily.   . brimonidine-timolol (COMBIGAN) 0.2-0.5 % ophthalmic solution Place 1 drop into both eyes every 12 (twelve) hours.   . calcium citrate (CALCITRATE - DOSED IN MG ELEMENTAL CALCIUM) 950 (200 Ca) MG tablet Take 200 mg of elemental calcium by mouth 2 (two) times daily.  . Cholecalciferol (VITAMIN D3) 1000 units CAPS Take 1,000 Units by mouth.   . Coenzyme Q10 (CO Q 10) 100 MG CAPS Take 1 tablet by mouth daily.   Colleen White dicyclomine (BENTYL) 10 MG capsule Take 10 mg by mouth 3 (three) times daily as needed for spasms.  Colleen White diltiazem (CARDIZEM CD) 120 MG 24 hr capsule TAKE (1) CAPSULE DAILY.  Colleen White docusate sodium (COLACE) 100 MG capsule Take 200 mg by mouth at bedtime.   Colleen White esomeprazole (NEXIUM) 20 MG packet Take 20 mg by mouth at bedtime. Takes as needed.  . gabapentin (NEURONTIN) 100 MG capsule Take 1 capsule (100 mg total) daily by mouth. (Patient taking differently: Take 200 mg by mouth at bedtime. )  . loratadine (CLARITIN) 10 MG tablet Take by mouth daily as needed.   Colleen White MINOXIDIL FOR MEN EX Apply topically. Takes as needed at night.  . mirtazapine (REMERON) 15 MG tablet Take 7.5 mg by mouth as needed. Takes 1/4 tablet at bedtime for itching of scalp  . Multiple Vitamins-Minerals (PRESERVISION AREDS 2 PO) Take 1 tablet by mouth 2 (two) times daily.   . potassium chloride (MICRO-K) 10 MEQ CR capsule Take 1 capsule (10 mEq total) by mouth with breakfast, with lunch, and with evening meal.  . pravastatin (PRAVACHOL) 40 MG tablet TAKE 1 TABLET ONCE DAILY.  . Probiotic Product (PROBIOTIC DAILY PO) Take 1 tablet by mouth daily.  Colleen White telmisartan-hydrochlorothiazide (MICARDIS HCT) 80-12.5 MG tablet TAKE 1 TABLET ONCE DAILY.  . traMADol (ULTRAM) 50 MG tablet TAKE ONE TABLET EVERY 6 HOURS AS NEEDED.  Colleen White tretinoin (RETIN-A) 0.05 % cream Once daily   . XARELTO 20 MG TABS tablet TAKE 1 TABLET DAILY  WITH SUPPER.  . zinc gluconate 50 MG tablet Take 50 mg by mouth daily as needed.     Allergies:   Patient has no known allergies.   Social History   Tobacco Use  . Smoking status: Former Smoker    Quit date: 08/27/1974    Years since quitting: 45.9  . Smokeless tobacco: Never Used  Vaping Use  . Vaping Use: Never used  Substance Use Topics  . Alcohol use: No  . Drug use: No     Family Hx: The patient's family history includes Breast cancer in her mother and sister; Colon cancer in her maternal uncle; Diabetes in her father; Esophageal cancer in her father; Heart disease in her father and mother;  Hypertension in her mother; Prostate cancer in her father.  ROS:   Please see the history of present illness.     All other systems reviewed and are negative.   Labs/Other Tests and Data Reviewed:    Recent Labs: 11/03/2019: TSH 2.14 11/11/2019: ALT 23 01/28/2020: BUN 15; Creatinine, Ser 0.73; Hemoglobin 12.6; Platelets 197.0; Potassium 3.4; Sodium 138   Recent Lipid Panel Lab Results  Component Value Date/Time   CHOL 138 11/03/2019 01:09 PM   TRIG 87.0 11/03/2019 01:09 PM   HDL 47.00 11/03/2019 01:09 PM   CHOLHDL 3 11/03/2019 01:09 PM   LDLCALC 73 11/03/2019 01:09 PM   LDLDIRECT 142.5 09/30/2012 11:10 AM    Wt Readings from Last 3 Encounters:  07/11/20 151 lb (68.5 kg)  05/11/20 153 lb (69.4 kg)  01/28/20 142 lb (64.4 kg)     Objective:    Vital Signs:  BP 127/71   Pulse 76   Ht 5' 3"  (1.6 m)   Wt 151 lb (68.5 kg)   BMI 26.75 kg/m     ASSESSMENT & PLAN:    1. OSA - The patient is tolerating PAP therapy well without any problems. The PAP download was reviewed today and showed an AHI of 1.9/hr on 7 cm H2O with 97% compliance in using more than 4 hours nightly.  The patient has been using and benefiting from PAP use and will continue to benefit from therapy.   2.  HTN -Bp controlled today -continue Cardizem CD 147m daily, Telmisartan-HCT 80-12.583mdaily  3.   PAF -she has not had any palpitations -no bleeding on DOA -continue Cardizem and Xarelto 2073maily -repeat BMET and CBC  COVID-19 Education: The signs and symptoms of COVID-19 were discussed with the patient and how to seek care for testing (follow up with PCP or arrange E-visit).  The importance of social distancing was discussed today.  Patient Risk:   After full review of this patient's clinical status, I feel that they are at least moderate risk at this time.  Time:   Today, I have spent 20 minutes directly with the patient on telemedicine discussing medical problems including OSA, PAF and reviewing patient's chart including PAP compliance download and labs.  Medication Adjustments/Labs and Tests Ordered: Current medicines are reviewed at length with the patient today.  Concerns regarding medicines are outlined above.  Tests Ordered: No orders of the defined types were placed in this encounter.  Medication Changes: No orders of the defined types were placed in this encounter.   Disposition:  Follow up in 1 year(s)  Signed, TraFransico HimD  07/11/2020 8:45 AM    [place]

## 2020-07-11 ENCOUNTER — Other Ambulatory Visit: Payer: Self-pay

## 2020-07-11 ENCOUNTER — Telehealth (INDEPENDENT_AMBULATORY_CARE_PROVIDER_SITE_OTHER): Payer: Medicare Other | Admitting: Cardiology

## 2020-07-11 ENCOUNTER — Encounter: Payer: Self-pay | Admitting: Cardiology

## 2020-07-11 VITALS — BP 127/71 | HR 76 | Ht 63.0 in | Wt 151.0 lb

## 2020-07-11 DIAGNOSIS — I480 Paroxysmal atrial fibrillation: Secondary | ICD-10-CM | POA: Diagnosis not present

## 2020-07-11 DIAGNOSIS — G4733 Obstructive sleep apnea (adult) (pediatric): Secondary | ICD-10-CM

## 2020-07-11 DIAGNOSIS — I10 Essential (primary) hypertension: Secondary | ICD-10-CM | POA: Diagnosis not present

## 2020-07-11 NOTE — Patient Instructions (Signed)
Medication Instructions:  Your physician recommends that you continue on your current medications as directed. Please refer to the Current Medication list given to you today.  *If you need a refill on your cardiac medications before your next appointment, please call your pharmacy*   Lab Work: none If you have labs (blood work) drawn today and your tests are completely normal, you will receive your results only by: [name] MyChart Message (if you have MyChart) OR . A paper copy in the mail If you have any lab test that is abnormal or we need to change your treatment, we will call you to review the results.   Testing/Procedures: none   Follow-Up: At [facility], you and your health needs are our priority.  As part of our continuing mission to provide you with exceptional heart care, we have created designated Provider Care Teams.  These Care Teams include your primary Cardiologist (physician) and Advanced Practice Providers (APPs -  Physician Assistants and Nurse Practitioners) who all work together to provide you with the care you need, when you need it.  We recommend signing up for the patient portal called "MyChart".  Sign up information is provided on this After Visit Summary.  MyChart is used to connect with patients for Virtual Visits (Telemedicine).  Patients are able to view lab/test results, encounter notes, upcoming appointments, etc.  Non-urgent messages can be sent to your provider as well.   To learn more about what you can do with MyChart, go to [email or web address].    Your next appointment:   12 month(s)  The format for your next appointment:   Virtual Visit   Provider:   Fransico Him, MD   Other Instructions

## 2020-07-13 ENCOUNTER — Other Ambulatory Visit (HOSPITAL_COMMUNITY): Payer: Self-pay | Admitting: Cardiology

## 2020-07-14 DIAGNOSIS — H04123 Dry eye syndrome of bilateral lacrimal glands: Secondary | ICD-10-CM | POA: Diagnosis not present

## 2020-07-14 DIAGNOSIS — Z961 Presence of intraocular lens: Secondary | ICD-10-CM | POA: Diagnosis not present

## 2020-07-14 DIAGNOSIS — H401131 Primary open-angle glaucoma, bilateral, mild stage: Secondary | ICD-10-CM | POA: Diagnosis not present

## 2020-07-14 DIAGNOSIS — H02403 Unspecified ptosis of bilateral eyelids: Secondary | ICD-10-CM | POA: Diagnosis not present

## 2020-07-19 ENCOUNTER — Encounter (HOSPITAL_COMMUNITY): Payer: Self-pay | Admitting: Cardiology

## 2020-07-19 ENCOUNTER — Ambulatory Visit (HOSPITAL_COMMUNITY)
Admission: RE | Admit: 2020-07-19 | Discharge: 2020-07-19 | Disposition: A | Discharge status: Home / Self Care | Payer: Medicare Other | Source: Ambulatory Visit | Attending: Cardiology | Admitting: Cardiology

## 2020-07-19 ENCOUNTER — Other Ambulatory Visit: Payer: Self-pay

## 2020-07-19 VITALS — BP 160/100 | HR 83 | Wt 155.2 lb

## 2020-07-19 DIAGNOSIS — E785 Hyperlipidemia, unspecified: Secondary | ICD-10-CM | POA: Insufficient documentation

## 2020-07-19 DIAGNOSIS — Z79899 Other long term (current) drug therapy: Secondary | ICD-10-CM | POA: Diagnosis not present

## 2020-07-19 DIAGNOSIS — Z7901 Long term (current) use of anticoagulants: Secondary | ICD-10-CM | POA: Diagnosis not present

## 2020-07-19 DIAGNOSIS — I10 Essential (primary) hypertension: Secondary | ICD-10-CM | POA: Insufficient documentation

## 2020-07-19 DIAGNOSIS — Z8249 Family history of ischemic heart disease and other diseases of the circulatory system: Secondary | ICD-10-CM | POA: Insufficient documentation

## 2020-07-19 DIAGNOSIS — I480 Paroxysmal atrial fibrillation: Secondary | ICD-10-CM | POA: Diagnosis not present

## 2020-07-19 DIAGNOSIS — G4733 Obstructive sleep apnea (adult) (pediatric): Secondary | ICD-10-CM | POA: Diagnosis not present

## 2020-07-19 DIAGNOSIS — E7800 Pure hypercholesterolemia, unspecified: Secondary | ICD-10-CM | POA: Diagnosis not present

## 2020-07-19 LAB — CBC
HCT: 38.4 % (ref 36.0–46.0)
Hemoglobin: 12.9 g/dL (ref 12.0–15.0)
MCH: 30.9 pg (ref 26.0–34.0)
MCHC: 33.6 g/dL (ref 30.0–36.0)
MCV: 92.1 fL (ref 80.0–100.0)
Platelets: 227 10*3/uL (ref 150–400)
RBC: 4.17 MIL/uL (ref 3.87–5.11)
RDW: 12.1 % (ref 11.5–15.5)
WBC: 6.2 10*3/uL (ref 4.0–10.5)
nRBC: 0 % (ref 0.0–0.2)

## 2020-07-19 LAB — BASIC METABOLIC PANEL
Anion gap: 10 (ref 5–15)
BUN: 11 mg/dL (ref 8–23)
CO2: 28 mmol/L (ref 22–32)
Calcium: 9.5 mg/dL (ref 8.9–10.3)
Chloride: 100 mmol/L (ref 98–111)
Creatinine, Ser: 0.68 mg/dL (ref 0.44–1.00)
GFR, Estimated: >60 mL/min (ref >60)
Glucose, Bld: 101 mg/dL — ABNORMAL HIGH (ref 70–99)
Potassium: 3.6 mmol/L (ref 3.5–5.1)
Sodium: 138 mmol/L (ref 135–145)

## 2020-07-19 LAB — LIPID PANEL
Cholesterol: 144 mg/dL (ref 0–200)
HDL: 46 mg/dL (ref >40)
LDL Cholesterol: 87 mg/dL (ref 0–99)
Total CHOL/HDL Ratio: 3.1 RATIO
Triglycerides: 56 mg/dL (ref <150)
VLDL: 11 mg/dL (ref 0–40)

## 2020-07-19 NOTE — Patient Instructions (Signed)
Labs done today, your results will be available in MyChart, we will contact you for abnormal readings.  Your physician has requested that you regularly monitor and record your blood pressure readings at home. Please use the same machine at the same time of day to check your readings and record them to bring to your follow-up visit.  Please call our office in 1 year to schedule your follow up appointment  If you have any questions or concerns before your next appointment please send [place] a message through [place] or call our office at [phone].    TO LEAVE A MESSAGE FOR THE NURSE SELECT OPTION 2, PLEASE LEAVE A MESSAGE INCLUDING: . YOUR NAME . DATE OF BIRTH . CALL BACK NUMBER . REASON FOR CALL**this is important as we prioritize the call backs  [place] AS LONG AS YOU CALL BEFORE 4:00 PM  At the [facility], you and your health needs are our priority. As part of our continuing mission to provide you with exceptional heart care, we have created designated Provider Care Teams. These Care Teams include your primary Cardiologist (physician) and Advanced Practice Providers (APPs- Physician Assistants and Nurse Practitioners) who all work together to provide you with the care you need, when you need it.   You may see any of the following providers on your designated Care Team at your next follow up: [name] Dr Glori Bickers . Dr [name] . Darrick Grinder, NP . Lyda Jester, PA . Audry Riles, PharmD   Please be sure to bring in all your medications bottles to every appointment.

## 2020-07-19 NOTE — Progress Notes (Signed)
Patient ID: Colleen White, female   DOB: Jun 30, 1946, 74 y.o.   MRN: [id number] PCP: Dr. Ronnald Ramp Cardiology: Dr. Aundra Dubin  75 y.o. with history of HTN, hyperlipidemia, paroxysmal atrial fibrillation and prior non-Hodgkins lymphoma presents for followup of atrial fibrillation.  She called the office in 1/17 to report increased episodes of palpitations.  She had had rare tachypalpitations for years, but in 1/17 she had 2 episodes of tachypalpitations close together.  I had her wear an event monitor.  By monitor, her tachypalpitations correlated with atrial fibrillation with RVR.  She was started on Xarelto and Toprol XL.  Toprol XL caused swelling, so it was stopped and diltiazem CD was begun.  Echo in 3/17 showed EF 65-70% with mildly elevated LVOT velocity due to vigorous contraction.  BP is elevated today but she was running late and walked fast to get here (+stress).  She says that SBP has been in the 120s-130s at home.  No palpitations, she is in NSR today.  No exertional dyspnea, does yardwork like raking without trouble.  No chest pain.  She had some numbness/tingling in her left upper arm today that resolved after about 10 minutes.  No BRBPR/melena. She is using CPAP.      ECG: NSR, normal (personally reviewed)  Labs (4/15): K 3.7, creatinine 0.6, LDL 111, HDL 46 Labs (4/16): LDL 89 Labs (3/17): K 3.8, creatinine 0.7, HCT 37.3 Labs (4/17): LDL 93, HDL 46 Labs (8/17): hgb 13.1, K 3.4, creatinine 0.7 Labs (4/18): LDL 99, hgb 13.7 Labs (10/18): K 3.7, creatinine 0.75 Labs (6/19): LDL 97 Labs (8/19): K 3.9, creatinine 0.84 Labs (8/20): K 3.9, creatinine 0.8, LDL 88 Labs (3/21): LDL 73 Labs (6/21): K 3.4, creatinine 0.73  PMH: 1. Echo (11/15): EF 60-65%, trivial MR. Echo (3/17) with EF 65-70%, mildly elevated LVOT velocity due to vigorous contraction. 2. Chest pain: Myoview normal in 2/05. Stress echo (11/15) with no evidence for ischemia or infarction.  3. Suspected glaucoma 4. HTN 5.  Hyperlipidemia 6. GERD 7. Diverticulosis 8. TAH/BSO 9. Non-Hodgkins lymphoma: In remission.  Treated at [facility] with surgery and XRT.  Did not have chemotherapy.  10. Pulmonary nodules: Presumed benign after observation.  11. Atrial fibrillation: Paroxysmal.  12. OSA: Uses CPAP.   SH: Married, cares for elderly mother, nonsmoker.  Lives in [place].  FH: Mother with atrial fibrillation and CAD.   ROS: All systems reviewed and negative except as per HPI.   Current Outpatient Medications  Medication Sig Dispense Refill  . Ascorbic Acid (VITAMIN C PO) Take 500 mg by mouth daily.     . brimonidine-timolol (COMBIGAN) 0.2-0.5 % ophthalmic solution Place 1 drop into both eyes every 12 (twelve) hours.     . calcium citrate (CALCITRATE - DOSED IN MG ELEMENTAL CALCIUM) 950 (200 Ca) MG tablet Take 200 mg of elemental calcium by mouth 2 (two) times daily.    . Cholecalciferol (VITAMIN D3) 1000 units CAPS Take 1,000 Units by mouth.     . Coenzyme Q10 (CO Q 10) 100 MG CAPS Take 1 tablet by mouth daily.     [name] dicyclomine (BENTYL) 10 MG capsule Take 10 mg by mouth 3 (three) times daily as needed for spasms.    [name] diltiazem (CARDIZEM CD) 120 MG 24 hr capsule TAKE (1) CAPSULE DAILY. 90 capsule 0  . docusate sodium (COLACE) 100 MG capsule Take 200 mg by mouth at bedtime.     [name] esomeprazole (NEXIUM) 20 MG packet Take 20 mg by mouth at bedtime.  Takes as needed.    . gabapentin (NEURONTIN) 100 MG capsule Take 1 capsule (100 mg total) daily by mouth. (Patient taking differently: Take 300 mg by mouth at bedtime. ) 90 capsule 0  . loratadine (CLARITIN) 10 MG tablet Take by mouth daily as needed.     [name] MINOXIDIL FOR MEN EX Apply topically. Takes as needed at night.    . mirtazapine (REMERON) 15 MG tablet Take 7.5 mg by mouth as needed. Takes 1/4 tablet at bedtime for itching of scalp    . Multiple Vitamins-Minerals (PRESERVISION AREDS 2 PO) Take 1 tablet by mouth 2 (two) times daily.     . potassium chloride  (MICRO-K) 10 MEQ CR capsule Take 1 capsule (10 mEq total) by mouth with breakfast, with lunch, and with evening meal. 270 capsule 1  . pravastatin (PRAVACHOL) 40 MG tablet TAKE 1 TABLET ONCE DAILY. 90 tablet 1  . Probiotic Product (PROBIOTIC DAILY PO) Take 1 tablet by mouth daily.    [name] telmisartan-hydrochlorothiazide (MICARDIS HCT) 80-12.5 MG tablet TAKE 1 TABLET ONCE DAILY. 90 tablet 0  . traMADol (ULTRAM) 50 MG tablet TAKE ONE TABLET EVERY 6 HOURS AS NEEDED. 65 tablet 3  . tretinoin (RETIN-A) 0.05 % cream Once daily     . XARELTO 20 MG TABS tablet TAKE 1 TABLET DAILY WITH SUPPER. 30 tablet 6  . zinc gluconate 50 MG tablet Take 50 mg by mouth daily as needed.     No current facility-administered medications for this encounter.    BP (!) 160/100   Pulse 83   Wt 70.4 kg (155 lb 3.2 oz)   SpO2 97%   BMI 27.49 kg/m  General: NAD Neck: No JVD, no thyromegaly or thyroid nodule.  Lungs: Clear to auscultation bilaterally with normal respiratory effort. CV: Nondisplaced PMI.  Heart regular S1/S2, no S3/S4, no murmur.  No peripheral edema.  No carotid bruit.  Normal pedal pulses.  Abdomen: Soft, nontender, no hepatosplenomegaly, no distention.  Skin: Intact without lesions or rashes.  Neurologic: Alert and oriented x 3.  Psych: Normal affect. Extremities: No clubbing or cyanosis.  HEENT: Normal.   Assessment/Plan: 1. HTN: BP high today but in setting of stress. - Check BP daily x 2 wks, she will call with readings.  - Continue telmisartan/HCTZ and diltiazem CD for now.   2. Hyperlipidemia: Lipids ok in 3/21.  3. Atrial fibrillation: Paroxysmal.  First noted in 1/17.  No symptomatic recurrences recently.   CHADSVASC = 3.  Best route for prevention will be to continue to control OSA and HTN.  - Continue diltiazem CD 120 daily.  - Continue Xarelto 20 daily. BMET and CBC today. - She is now using CPAP. 4. OSA: Using CPAP.  5. Hyperlipidemia: Check lipids today.   Followup in 1 year.    [name] 07/19/2020

## 2020-07-31 ENCOUNTER — Encounter: Payer: Self-pay | Admitting: Internal Medicine

## 2020-07-31 LAB — NOVEL CORONAVIRUS, NAA: SARS-CoV-2, NAA: NOT DETECTED

## 2020-08-01 ENCOUNTER — Ambulatory Visit (INDEPENDENT_AMBULATORY_CARE_PROVIDER_SITE_OTHER): Payer: Medicare Other | Admitting: Internal Medicine

## 2020-08-01 ENCOUNTER — Ambulatory Visit: Payer: Medicare Other | Admitting: Internal Medicine

## 2020-08-01 ENCOUNTER — Other Ambulatory Visit: Payer: Self-pay

## 2020-08-01 ENCOUNTER — Encounter: Payer: Self-pay | Admitting: Internal Medicine

## 2020-08-01 ENCOUNTER — Ambulatory Visit (INDEPENDENT_AMBULATORY_CARE_PROVIDER_SITE_OTHER): Payer: Medicare Other

## 2020-08-01 VITALS — BP 124/76 | HR 89 | Temp 98.2°F | Resp 16 | Ht 63.0 in | Wt 152.0 lb

## 2020-08-01 DIAGNOSIS — J22 Unspecified acute lower respiratory infection: Secondary | ICD-10-CM | POA: Diagnosis not present

## 2020-08-01 DIAGNOSIS — Z23 Encounter for immunization: Secondary | ICD-10-CM | POA: Diagnosis not present

## 2020-08-01 DIAGNOSIS — R059 Cough, unspecified: Secondary | ICD-10-CM

## 2020-08-01 MED ORDER — HYDROCOD POLST-CPM POLST ER 10-8 MG/5ML PO SUER: 5.0000 mL | Freq: Two times a day (BID) | ORAL | 0 refills | Status: DC | PRN

## 2020-08-01 MED ORDER — CEFDINIR 300 MG PO CAPS: 300.0000 mg | ORAL_CAPSULE | Freq: Two times a day (BID) | ORAL | 0 refills | Status: AC

## 2020-08-01 NOTE — Progress Notes (Addendum)
Subjective:  Patient ID: Colleen White, female    DOB: 1946-03-30  Age: 74 y.o. MRN: [id number]  CC: Cough  This visit occurred during the SARS-CoV-2 public health emergency.  Safety protocols were in place, including screening questions prior to the visit, additional usage of staff PPE, and extensive cleaning of exam room while observing appropriate contact time as indicated for disinfecting solutions.    HPI Colleen White presents for the complaint of a 1 week history of cough productive of thick yellow phlegm but she denies shortness of breath, fever, chills, night sweats, or hemoptysis.  The cough interferes with her sleep and her daily activities.  She has not gotten much symptom relief with over-the-counter remedies.  Outpatient Medications Prior to Visit  Medication Sig Dispense Refill  . Ascorbic Acid (VITAMIN C PO) Take 500 mg by mouth daily.     . brimonidine-timolol (COMBIGAN) 0.2-0.5 % ophthalmic solution Place 1 drop into both eyes every 12 (twelve) hours.     . calcium citrate (CALCITRATE - DOSED IN MG ELEMENTAL CALCIUM) 950 (200 Ca) MG tablet Take 200 mg of elemental calcium by mouth 2 (two) times daily.    . Cholecalciferol (VITAMIN D3) 1000 units CAPS Take 1,000 Units by mouth.     . Coenzyme Q10 (CO Q 10) 100 MG CAPS Take 1 tablet by mouth daily.     [name] dicyclomine (BENTYL) 10 MG capsule Take 10 mg by mouth 3 (three) times daily as needed for spasms.    [name] diltiazem (CARDIZEM CD) 120 MG 24 hr capsule TAKE (1) CAPSULE DAILY. 90 capsule 0  . docusate sodium (COLACE) 100 MG capsule Take 200 mg by mouth at bedtime.     [name] esomeprazole (NEXIUM) 20 MG packet Take 20 mg by mouth at bedtime. Takes as needed.    . gabapentin (NEURONTIN) 100 MG capsule Take 1 capsule (100 mg total) daily by mouth. (Patient taking differently: Take 300 mg by mouth at bedtime. ) 90 capsule 0  . loratadine (CLARITIN) 10 MG tablet Take by mouth daily as needed.     [name] MINOXIDIL FOR MEN EX Apply  topically. Takes as needed at night.    . mirtazapine (REMERON) 15 MG tablet Take 7.5 mg by mouth as needed. Takes 1/4 tablet at bedtime for itching of scalp    . Multiple Vitamins-Minerals (PRESERVISION AREDS 2 PO) Take 1 tablet by mouth 2 (two) times daily.     . potassium chloride (MICRO-K) 10 MEQ CR capsule Take 1 capsule (10 mEq total) by mouth with breakfast, with lunch, and with evening meal. 270 capsule 1  . pravastatin (PRAVACHOL) 40 MG tablet TAKE 1 TABLET ONCE DAILY. 90 tablet 1  . Probiotic Product (PROBIOTIC DAILY PO) Take 1 tablet by mouth daily.    [name] telmisartan-hydrochlorothiazide (MICARDIS HCT) 80-12.5 MG tablet TAKE 1 TABLET ONCE DAILY. 90 tablet 0  . traMADol (ULTRAM) 50 MG tablet TAKE ONE TABLET EVERY 6 HOURS AS NEEDED. 65 tablet 3  . tretinoin (RETIN-A) 0.05 % cream Once daily     . XARELTO 20 MG TABS tablet TAKE 1 TABLET DAILY WITH SUPPER. 30 tablet 6  . zinc gluconate 50 MG tablet Take 50 mg by mouth daily as needed.     No facility-administered medications prior to visit.    ROS Review of Systems  Constitutional: Negative for chills, diaphoresis, fatigue and fever.  HENT: Negative.  Negative for sore throat and trouble swallowing.   Respiratory: Positive for cough. Negative for chest tightness,  shortness of breath and wheezing.   Cardiovascular: Negative for chest pain, palpitations and leg swelling.  Gastrointestinal: Negative for abdominal pain, diarrhea, nausea and vomiting.  Endocrine: Negative.   Genitourinary: Negative.   Musculoskeletal: Negative.  Negative for arthralgias.  Skin: Negative.  Negative for color change and pallor.  Neurological: Negative.  Negative for dizziness, weakness, light-headedness and numbness.  Hematological: Negative for adenopathy. Does not bruise/bleed easily.  Psychiatric/Behavioral: Negative.     Objective:  BP 124/76   Pulse 89   Temp 98.2 F (36.8 C) (Oral)   Resp 16   Ht 5' 3"  (1.6 m)   Wt 152 lb (68.9 kg)   SpO2  97%   BMI 26.93 kg/m   BP Readings from Last 3 Encounters:  08/01/20 124/76  07/19/20 (!) 160/100  07/11/20 127/71    Wt Readings from Last 3 Encounters:  08/01/20 152 lb (68.9 kg)  07/19/20 155 lb 3.2 oz (70.4 kg)  07/11/20 151 lb (68.5 kg)    Physical Exam Vitals reviewed.  Constitutional:      General: She is not in acute distress.    Appearance: Normal appearance. She is not ill-appearing, toxic-appearing or diaphoretic.  HENT:     Nose: Nose normal.     Mouth/Throat:     Mouth: Mucous membranes are moist.  Eyes:     General: No scleral icterus.    Conjunctiva/sclera: Conjunctivae normal.  Cardiovascular:     Rate and Rhythm: Normal rate and regular rhythm.     Heart sounds: No murmur heard.   Pulmonary:     Effort: Pulmonary effort is normal.     Breath sounds: No stridor. No wheezing, rhonchi or rales.  Abdominal:     General: Abdomen is flat.     Palpations: There is no mass.     Tenderness: There is no abdominal tenderness. There is no guarding.  Musculoskeletal:        General: Normal range of motion.     Cervical back: Neck supple.     Right lower leg: No edema.     Left lower leg: No edema.  Lymphadenopathy:     Cervical: No cervical adenopathy.  Skin:    General: Skin is warm and dry.     Coloration: Skin is not pale.  Neurological:     General: No focal deficit present.     Mental Status: She is alert.  Psychiatric:        Mood and Affect: Mood normal.        Behavior: Behavior normal.     Lab Results  Component Value Date   WBC 6.2 07/19/2020   HGB 12.9 07/19/2020   HCT 38.4 07/19/2020   PLT 227 07/19/2020   GLUCOSE 101 (H) 07/19/2020   CHOL 144 07/19/2020   TRIG 56 07/19/2020   HDL 46 07/19/2020   LDLDIRECT 142.5 09/30/2012   LDLCALC 87 07/19/2020   ALT 23 11/11/2019   AST 28 11/11/2019   NA 138 07/19/2020   K 3.6 07/19/2020   CL 100 07/19/2020   CREATININE 0.68 07/19/2020   BUN 11 07/19/2020   CO2 28 07/19/2020   TSH 2.14  11/03/2019   INR 1.8 (H) 11/03/2019   HGBA1C 5.8 09/10/2018    DG Chest 2 View  Result Date: 08/01/2020 CLINICAL DATA:  Cough.  History of lung nodule EXAM: CHEST - 2 VIEW COMPARISON:  CT chest 04/02/2016.  Chest x-ray 04/02/2016 FINDINGS: Cardiac and mediastinal contours normal. Pulmonary vascularity normal. Lungs clear  without infiltrate or effusion. No lung nodule identified. IMPRESSION: No active cardiopulmonary disease. Electronically Signed   By: Franchot Gallo M.D.   On: 08/01/2020 11:31    Assessment & Plan:   Jazzlynn was seen today for cough.  Diagnoses and all orders for this visit:  Flu vaccine need -     Flu Vaccine QUAD High Dose(Fluad)  Cough- Her chest x-ray is negative for mass or infiltrate.  Will treat her for bacterial lower respiratory tract infection with a cephalosporin antibiotic.  Will control the symptoms with chlorpheniramine and hydrocodone. -     DG Chest 2 View; Future -     chlorpheniramine-HYDROcodone (TUSSIONEX PENNKINETIC ER) 10-8 MG/5ML SUER; Take 5 mLs by mouth every 12 (twelve) hours as needed for cough.  LRTI (lower respiratory tract infection) -     chlorpheniramine-HYDROcodone (TUSSIONEX PENNKINETIC ER) 10-8 MG/5ML SUER; Take 5 mLs by mouth every 12 (twelve) hours as needed for cough. -     cefdinir (OMNICEF) 300 MG capsule; Take 1 capsule (300 mg total) by mouth 2 (two) times daily for 7 days.   I am having [name] start on chlorpheniramine-HYDROcodone and cefdinir. I am also having her maintain her tretinoin, Multiple Vitamins-Minerals (PRESERVISION AREDS 2 PO), Probiotic Product (PROBIOTIC DAILY PO), gabapentin, brimonidine-timolol, loratadine, Vitamin D3, mirtazapine, docusate sodium, esomeprazole, Co Q 10, Ascorbic Acid (VITAMIN C PO), calcium citrate, MINOXIDIL FOR MEN EX, potassium chloride, Xarelto, traMADol, dicyclomine, zinc gluconate, pravastatin, telmisartan-hydrochlorothiazide, and diltiazem.  Meds ordered this encounter   Medications  . chlorpheniramine-HYDROcodone (TUSSIONEX PENNKINETIC ER) 10-8 MG/5ML SUER    Sig: Take 5 mLs by mouth every 12 (twelve) hours as needed for cough.    Dispense:  140 mL    Refill:  0  . cefdinir (OMNICEF) 300 MG capsule    Sig: Take 1 capsule (300 mg total) by mouth 2 (two) times daily for 7 days.    Dispense:  14 capsule    Refill:  0     Follow-up: Return if symptoms worsen or fail to improve.  Scarlette Calico, MD

## 2020-08-01 NOTE — Patient Instructions (Signed)
Upper Respiratory Infection, Adult An upper respiratory infection (URI) affects the nose, throat, and upper air passages. URIs are caused by germs (viruses). The most common type of URI is often called "the common cold." Medicines cannot cure URIs, but you can do things at home to relieve your symptoms. URIs usually get better within 7-10 days. Follow these instructions at home: Activity  Rest as needed.  If you have a fever, stay home from work or school until your fever is gone, or until your doctor says you may return to work or school. ? You should stay home until you cannot spread the infection anymore (you are not contagious). ? Your doctor may have you wear a face mask so you have less risk of spreading the infection. Relieving symptoms  Gargle with a salt-water mixture 3-4 times a day or as needed. To make a salt-water mixture, completely dissolve -1 tsp of salt in 1 cup of warm water.  Use a cool-mist humidifier to add moisture to the air. This can help you breathe more easily. Eating and drinking   Drink enough fluid to keep your pee (urine) pale yellow.  Eat soups and other clear broths. General instructions   Take over-the-counter and prescription medicines only as told by your doctor. These include cold medicines, fever reducers, and cough suppressants.  Do not use any products that contain nicotine or tobacco. These include cigarettes and e-cigarettes. If you need help quitting, ask your doctor.  Avoid being where people are smoking (avoid secondhand smoke).  Make sure you get regular shots and get the flu shot every year.  Keep all follow-up visits as told by your doctor. This is important. How to avoid spreading infection to others   Wash your hands often with soap and water. If you do not have soap and water, use hand sanitizer.  Avoid touching your mouth, face, eyes, or nose.  Cough or sneeze into a tissue or your sleeve or elbow. Do not cough or sneeze  into your hand or into the air. Contact a doctor if:  You are getting worse, not better.  You have any of these: ? A fever. ? Chills. ? Brown or red mucus in your nose. ? Yellow or brown fluid (discharge)coming from your nose. ? Pain in your face, especially when you bend forward. ? Swollen neck glands. ? Pain with swallowing. ? White areas in the back of your throat. Get help right away if:  You have shortness of breath that gets worse.  You have very bad or constant: ? Headache. ? Ear pain. ? Pain in your forehead, behind your eyes, and over your cheekbones (sinus pain). ? Chest pain.  You have long-lasting (chronic) lung disease along with any of these: ? Wheezing. ? Long-lasting cough. ? Coughing up blood. ? A change in your usual mucus.  You have a stiff neck.  You have changes in your: ? Vision. ? Hearing. ? Thinking. ? Mood. Summary  An upper respiratory infection (URI) is caused by a germ called a virus. The most common type of URI is often called "the common cold."  URIs usually get better within 7-10 days.  Take over-the-counter and prescription medicines only as told by your doctor. This information is not intended to replace advice given to you by your health care provider. Make sure you discuss any questions you have with your health care provider. Document Revised: 08/21/2018 Document Reviewed: 04/05/2017 Elsevier Patient Education  [place].

## 2020-08-08 ENCOUNTER — Encounter (HOSPITAL_COMMUNITY): Payer: Self-pay

## 2020-08-08 DIAGNOSIS — R922 Inconclusive mammogram: Secondary | ICD-10-CM | POA: Diagnosis not present

## 2020-08-08 DIAGNOSIS — Z9189 Other specified personal risk factors, not elsewhere classified: Secondary | ICD-10-CM | POA: Diagnosis not present

## 2020-08-11 DIAGNOSIS — Z9189 Other specified personal risk factors, not elsewhere classified: Secondary | ICD-10-CM | POA: Diagnosis not present

## 2020-08-30 DIAGNOSIS — L810 Postinflammatory hyperpigmentation: Secondary | ICD-10-CM | POA: Diagnosis not present

## 2020-08-30 DIAGNOSIS — R238 Other skin changes: Secondary | ICD-10-CM | POA: Diagnosis not present

## 2020-09-05 ENCOUNTER — Ambulatory Visit: Payer: Medicare Other | Admitting: Internal Medicine

## 2020-09-05 ENCOUNTER — Ambulatory Visit (INDEPENDENT_AMBULATORY_CARE_PROVIDER_SITE_OTHER): Payer: Medicare Other | Admitting: Internal Medicine

## 2020-09-05 ENCOUNTER — Encounter: Payer: Self-pay | Admitting: Internal Medicine

## 2020-09-05 ENCOUNTER — Other Ambulatory Visit: Payer: Self-pay

## 2020-09-05 VITALS — BP 124/82 | HR 75 | Temp 98.3°F | Resp 16 | Ht 63.0 in | Wt 150.0 lb

## 2020-09-05 DIAGNOSIS — E876 Hypokalemia: Secondary | ICD-10-CM

## 2020-09-05 DIAGNOSIS — I10 Essential (primary) hypertension: Secondary | ICD-10-CM | POA: Diagnosis not present

## 2020-09-05 DIAGNOSIS — I83811 Varicose veins of right lower extremities with pain: Secondary | ICD-10-CM | POA: Diagnosis not present

## 2020-09-05 DIAGNOSIS — T502X5A Adverse effect of carbonic-anhydrase inhibitors, benzothiadiazides and other diuretics, initial encounter: Secondary | ICD-10-CM | POA: Diagnosis not present

## 2020-09-05 DIAGNOSIS — L280 Lichen simplex chronicus: Secondary | ICD-10-CM | POA: Diagnosis not present

## 2020-09-05 MED ORDER — GABAPENTIN 100 MG PO CAPS: 300.0000 mg | ORAL_CAPSULE | Freq: Every day | ORAL | 1 refills | Status: AC

## 2020-09-05 NOTE — Progress Notes (Signed)
Subjective:  Patient ID: Colleen White, female    DOB: [birth year]  Age: 75 y.o. MRN: [id number]  CC: Hypertension  This visit occurred during the SARS-CoV-2 public health emergency.  Safety protocols were in place, including screening questions prior to the visit, additional usage of staff PPE, and extensive cleaning of exam room while observing appropriate contact time as indicated for disinfecting solutions.    HPI Colleen White presents for f/up - She tells me her blood pressure has been well controlled.  She is active and denies any recent episodes of chest pain, shortness of breath, palpitations, edema, fatigue, dizziness, or lightheadedness.  Outpatient Medications Prior to Visit  Medication Sig Dispense Refill  . Ascorbic Acid (VITAMIN C PO) Take 500 mg by mouth daily.     . brimonidine-timolol (COMBIGAN) 0.2-0.5 % ophthalmic solution Place 1 drop into both eyes every 12 (twelve) hours.     . calcium citrate (CALCITRATE - DOSED IN MG ELEMENTAL CALCIUM) 950 (200 Ca) MG tablet Take 200 mg of elemental calcium by mouth 2 (two) times daily.    . Cholecalciferol (VITAMIN D3) 1000 units CAPS Take 1,000 Units by mouth.     . Coenzyme Q10 (CO Q 10) 100 MG CAPS Take 1 tablet by mouth daily.     [name] dicyclomine (BENTYL) 10 MG capsule Take 10 mg by mouth 3 (three) times daily as needed for spasms.    [name] diltiazem (CARDIZEM CD) 120 MG 24 hr capsule TAKE (1) CAPSULE DAILY. 90 capsule 0  . docusate sodium (COLACE) 100 MG capsule Take 200 mg by mouth at bedtime.     [name] esomeprazole (NEXIUM) 20 MG packet Take 20 mg by mouth at bedtime. Takes as needed.    . loratadine (CLARITIN) 10 MG tablet Take by mouth daily as needed.     [name] MINOXIDIL FOR MEN EX Apply topically. Takes as needed at night.    . mirtazapine (REMERON) 15 MG tablet Take 7.5 mg by mouth as needed. Takes 1/4 tablet at bedtime for itching of scalp    . Multiple Vitamins-Minerals (PRESERVISION AREDS 2 PO) Take 1 tablet by mouth 2 (two)  times daily.     . pravastatin (PRAVACHOL) 40 MG tablet TAKE 1 TABLET ONCE DAILY. 90 tablet 1  . Probiotic Product (PROBIOTIC DAILY PO) Take 1 tablet by mouth daily.    [name] telmisartan-hydrochlorothiazide (MICARDIS HCT) 80-12.5 MG tablet TAKE 1 TABLET ONCE DAILY. 90 tablet 0  . traMADol (ULTRAM) 50 MG tablet TAKE ONE TABLET EVERY 6 HOURS AS NEEDED. 65 tablet 3  . tretinoin (RETIN-A) 0.05 % cream Once daily    . XARELTO 20 MG TABS tablet TAKE 1 TABLET DAILY WITH SUPPER. 30 tablet 6  . zinc gluconate 50 MG tablet Take 50 mg by mouth daily as needed.    . chlorpheniramine-HYDROcodone (TUSSIONEX PENNKINETIC ER) 10-8 MG/5ML SUER Take 5 mLs by mouth every 12 (twelve) hours as needed for cough. 140 mL 0  . gabapentin (NEURONTIN) 100 MG capsule Take 1 capsule (100 mg total) daily by mouth. (Patient taking differently: Take 300 mg by mouth at bedtime.) 90 capsule 0  . potassium chloride (MICRO-K) 10 MEQ CR capsule Take 1 capsule (10 mEq total) by mouth with breakfast, with lunch, and with evening meal. 270 capsule 1   No facility-administered medications prior to visit.    ROS Review of Systems  Constitutional: Negative for diaphoresis and fatigue.  HENT: Negative.   Eyes: Negative for visual disturbance.  Respiratory: Negative for cough,  chest tightness, shortness of breath and wheezing.   Cardiovascular: Negative for chest pain, palpitations and leg swelling.  Gastrointestinal: Negative for abdominal pain, diarrhea and nausea.  Genitourinary: Negative.  Negative for difficulty urinating.  Musculoskeletal: Negative.   Skin: Negative.   Neurological: Negative for dizziness, weakness and light-headedness.  Hematological: Negative for adenopathy. Does not bruise/bleed easily.  Psychiatric/Behavioral: Negative.     Objective:  BP 124/82   Pulse 75   Temp 98.3 F (36.8 C) (Oral)   Resp 16   Ht 5' 3"  (1.6 m)   Wt 150 lb (68 kg)   SpO2 98%   BMI 26.57 kg/m   BP Readings from Last 3  Encounters:  09/05/20 124/82  08/01/20 124/76  07/19/20 (!) 160/100    Wt Readings from Last 3 Encounters:  09/05/20 150 lb (68 kg)  08/01/20 152 lb (68.9 kg)  07/19/20 155 lb 3.2 oz (70.4 kg)    Physical Exam Vitals reviewed.  Constitutional:      Appearance: Normal appearance.  HENT:     Nose: Nose normal.     Mouth/Throat:     Mouth: Mucous membranes are moist.  Eyes:     General: No scleral icterus.    Conjunctiva/sclera: Conjunctivae normal.  Cardiovascular:     Rate and Rhythm: Normal rate and regular rhythm.     Heart sounds: No murmur heard.   Pulmonary:     Effort: Pulmonary effort is normal.     Breath sounds: No stridor. No wheezing, rhonchi or rales.  Abdominal:     General: Abdomen is flat. Bowel sounds are normal. There is no distension.     Palpations: Abdomen is soft. There is no hepatomegaly, splenomegaly or mass.     Tenderness: There is no abdominal tenderness.  Musculoskeletal:        General: Normal range of motion.     Cervical back: Neck supple.     Right lower leg: No edema.     Left lower leg: No edema.  Lymphadenopathy:     Cervical: No cervical adenopathy.  Skin:    General: Skin is warm and dry.     Coloration: Skin is not pale.  Neurological:     General: No focal deficit present.     Mental Status: She is alert.  Psychiatric:        Mood and Affect: Mood normal.        Behavior: Behavior normal.     Lab Results  Component Value Date   WBC 6.2 07/19/2020   HGB 12.9 07/19/2020   HCT 38.4 07/19/2020   PLT 227 07/19/2020   GLUCOSE 101 (H) 07/19/2020   CHOL 144 07/19/2020   TRIG 56 07/19/2020   HDL 46 07/19/2020   LDLDIRECT 142.5 09/30/2012   LDLCALC 87 07/19/2020   ALT 23 11/11/2019   AST 28 11/11/2019   NA 138 07/19/2020   K 3.6 07/19/2020   CL 100 07/19/2020   CREATININE 0.68 07/19/2020   BUN 11 07/19/2020   CO2 28 07/19/2020   TSH 2.14 11/03/2019   INR 1.8 (H) 11/03/2019   HGBA1C 5.8 09/10/2018    No results  found.  Assessment & Plan:   Haani was seen today for hypertension.  Diagnoses and all orders for this visit:  Varicose veins of right lower extremity with pain  Neurodermatitis -     gabapentin (NEURONTIN) 100 MG capsule; Take 3 capsules (300 mg total) by mouth at bedtime.  Diuretic-induced hypokalemia -  potassium chloride (MICRO-K) 10 MEQ CR capsule; Take 1 capsule (10 mEq total) by mouth with breakfast, with lunch, and with evening meal.  Essential hypertension- Her blood pressure is adequately well controlled.  Recent electrolytes and renal function were normal.   I have discontinued Colleen White's chlorpheniramine-HYDROcodone. I have also changed her gabapentin. Additionally, I am having her maintain her tretinoin, Multiple Vitamins-Minerals (PRESERVISION AREDS 2 PO), Probiotic Product (PROBIOTIC DAILY PO), brimonidine-timolol, loratadine, Vitamin D3, mirtazapine, docusate sodium, esomeprazole, Co Q 10, Ascorbic Acid (VITAMIN C PO), calcium citrate, MINOXIDIL FOR MEN EX, Xarelto, traMADol, dicyclomine, zinc gluconate, pravastatin, telmisartan-hydrochlorothiazide, diltiazem, and potassium chloride.  Meds ordered this encounter  Medications  . gabapentin (NEURONTIN) 100 MG capsule    Sig: Take 3 capsules (300 mg total) by mouth at bedtime.    Dispense:  270 capsule    Refill:  1  . potassium chloride (MICRO-K) 10 MEQ CR capsule    Sig: Take 1 capsule (10 mEq total) by mouth with breakfast, with lunch, and with evening meal.    Dispense:  270 capsule    Refill:  1     Follow-up: No follow-ups on file.  Scarlette Calico, MD

## 2020-09-08 ENCOUNTER — Encounter: Payer: Self-pay | Admitting: Internal Medicine

## 2020-09-08 MED ORDER — POTASSIUM CHLORIDE ER 10 MEQ PO CPCR: 10.0000 meq | ORAL_CAPSULE | Freq: Three times a day (TID) | ORAL | 1 refills | Status: DC

## 2020-09-16 ENCOUNTER — Encounter: Payer: Self-pay | Admitting: Internal Medicine

## 2020-09-20 ENCOUNTER — Encounter: Payer: Self-pay | Admitting: Gastroenterology

## 2020-09-20 ENCOUNTER — Ambulatory Visit (INDEPENDENT_AMBULATORY_CARE_PROVIDER_SITE_OTHER): Payer: Medicare Other | Admitting: Gastroenterology

## 2020-09-20 ENCOUNTER — Other Ambulatory Visit: Payer: Self-pay

## 2020-09-20 VITALS — BP 110/70 | HR 68 | Ht 62.5 in | Wt 152.2 lb

## 2020-09-20 DIAGNOSIS — K219 Gastro-esophageal reflux disease without esophagitis: Secondary | ICD-10-CM | POA: Diagnosis not present

## 2020-09-20 DIAGNOSIS — R1032 Left lower quadrant pain: Secondary | ICD-10-CM | POA: Diagnosis not present

## 2020-09-20 DIAGNOSIS — Z1212 Encounter for screening for malignant neoplasm of rectum: Secondary | ICD-10-CM | POA: Diagnosis not present

## 2020-09-20 DIAGNOSIS — Z1211 Encounter for screening for malignant neoplasm of colon: Secondary | ICD-10-CM | POA: Diagnosis not present

## 2020-09-20 DIAGNOSIS — R197 Diarrhea, unspecified: Secondary | ICD-10-CM

## 2020-09-20 NOTE — Progress Notes (Signed)
    History of Present Illness: This is a 75 year old female with chronic intermittent LLQ abdominal pain and intermittent diarrhea.  She notes frequent problems with intestinal gas.  Gas-X has provided some relief.  Intermittent reflux symptoms are  controlled with Nexium 20 mg daily as needed.  She is most interested in discussing colorectal cancer screening.  Her last colonoscopy was performed in 2012 at [facility] and was somewhat difficult with a prolonged intubation time.  At her last office visit we discussed other methods for colorectal cancer screening.  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  She is due for follow-up with her oncologist.  FIT negative in 03/2020. CBC normal in 03/2020. Denies weight loss, constipation, change in stool caliber, melena, hematochezia, nausea, vomiting, dysphagia, chest pain.  CT AP 04/03/2019: sigmoid diverticulosis, mild hepatic steatosis, small hepatic hemangioma, small hepatic cyst, small abdominal lymph nodes - unchanged   Current Medications, Allergies, Past Medical History, Past Surgical History, Family History and Social History were reviewed in [employer] record.   Physical Exam: General: Well developed, well nourished, no acute distress Head: Normocephalic and atraumatic Eyes:  sclerae anicteric, EOMI Ears: Normal auditory acuity Mouth: Not examined, mask on during Covid-19 pandemic Lungs: Clear throughout to auscultation Heart: Regular rate and rhythm; no murmurs, rubs or bruits Abdomen: Soft, non tender and non distended. No masses, hepatosplenomegaly or hernias noted. Normal Bowel sounds Rectal: Not done Musculoskeletal: Symmetrical with no gross deformities  Pulses:  Normal pulses noted Extremities: No clubbing, cyanosis, edema or deformities noted Neurological: Alert oriented x 4, grossly nonfocal Psychological:  Alert and cooperative. Normal mood and  affect   Assessment and Recommendations:  1. Intermittent diarrhea and LLQ pain. Suspecteded IBS. Continue dicyclomine 10 mg po tid prn. Recommended avoiding sugar alcohols and assess response.  She has difficulties with lactose and avoids lactose.  Consider trial of a low FODMAP if above measures if diarrhea is persistent.  Trial of a low gas diet.  Gas-X or equivalent 4 times daily as needed.  2. History of abdominal nodular lymphoma, 2012. Treated with resection and radiation therapy. Follow up with Oncology as recommended.   3. CRC screening, average risk. Her last colonoscopy in Jan 2012 at [facility] was somewhat difficult with a prolonged insertion time, diverticulosis and one small hyperplastic colon polyp. FIT was negative in 03/2020 which could be adequate for CRC screening however after discussion she is interested in proceeding with CT colonoscopy. She understands she is at a higher risk for complications with colonoscopy or an incomplete colonoscopy with her prior history of a somewhat difficult colonoscopy and her anticoagulation needs.    4. GERD. Follow antireflux measures and continue Nexium 20 mg po qd as needed.   5. Mild hepatic steatosis.  LFTs have been normal.  Long-term fat modified, carb modified diet supervised by her PCP.  6. Afib on Xarelto.

## 2020-09-20 NOTE — Patient Instructions (Addendum)
You have been scheduled for your CT virtual colonoscopy at [facility] at [address] Please go by the facility at least 4 days prior to pick up prep for test. Also bring your insurance cards to the visit.  If you need to reschedule or cancel, please contact their office at [phone].   Thank you for choosing me and [facility].  Pricilla Riffle. Dagoberto Ligas., MD., Marval Regal

## 2020-10-13 ENCOUNTER — Other Ambulatory Visit (HOSPITAL_COMMUNITY): Payer: Self-pay | Admitting: Cardiology

## 2020-10-13 ENCOUNTER — Other Ambulatory Visit: Payer: Self-pay | Admitting: Internal Medicine

## 2020-10-13 ENCOUNTER — Ambulatory Visit
Admission: RE | Admit: 2020-10-13 | Discharge: 2020-10-13 | Disposition: A | Discharge status: Home / Self Care | Payer: Medicare Other | Source: Ambulatory Visit | Attending: Gastroenterology | Admitting: Gastroenterology

## 2020-10-13 DIAGNOSIS — Z1211 Encounter for screening for malignant neoplasm of colon: Secondary | ICD-10-CM

## 2020-10-13 DIAGNOSIS — K5730 Diverticulosis of large intestine without perforation or abscess without bleeding: Secondary | ICD-10-CM | POA: Diagnosis not present

## 2020-10-13 DIAGNOSIS — I10 Essential (primary) hypertension: Secondary | ICD-10-CM

## 2020-10-13 DIAGNOSIS — Z1212 Encounter for screening for malignant neoplasm of rectum: Secondary | ICD-10-CM

## 2020-11-01 ENCOUNTER — Telehealth: Payer: Self-pay | Admitting: Pharmacist

## 2020-11-01 NOTE — Progress Notes (Signed)
° ° °  Chronic Care Management Pharmacy Assistant   Name: Colleen White  MRN: [id number] DOB: [birth year]   Reason for Encounter: General Adherence Call    Recent office visits:  08/01/21 Dr. Ronnald Ramp, patient had flu shot 09/05/20 Dr. Ronnald Ramp; patient seen for HTN, Dr. Ronnald Ramp increased Gabapentin to 300 mg daily  Recent consult visits:  09/20/20 Dr. Fuller Plan;  Patient was having diarrhea, scheduled to have ct virtual colonoscopy screening   Hospital visits:  None in previous 6 months  Medications: Outpatient Encounter Medications as of 11/01/2020  Medication Sig   Ascorbic Acid (VITAMIN C PO) Take 500 mg by mouth daily.    brimonidine-timolol (COMBIGAN) 0.2-0.5 % ophthalmic solution Place 1 drop into both eyes every 12 (twelve) hours.    calcium citrate (CALCITRATE - DOSED IN MG ELEMENTAL CALCIUM) 950 (200 Ca) MG tablet Take 200 mg of elemental calcium by mouth 2 (two) times daily.   Cholecalciferol (VITAMIN D3) 1000 units CAPS Take 1,000 Units by mouth.    Coenzyme Q10 (CO Q 10) 100 MG CAPS Take 1 tablet by mouth daily.    dicyclomine (BENTYL) 10 MG capsule Take 10 mg by mouth as needed for spasms.   diltiazem (CARDIZEM CD) 120 MG 24 hr capsule TAKE (1) CAPSULE DAILY.   docusate sodium (COLACE) 100 MG capsule Take 200 mg by mouth as needed.   esomeprazole (NEXIUM) 20 MG packet Take 20 mg by mouth at bedtime. Takes as needed.   gabapentin (NEURONTIN) 100 MG capsule Take 3 capsules (300 mg total) by mouth at bedtime.   loratadine (CLARITIN) 10 MG tablet Take by mouth daily as needed.    METAMUCIL FIBER PO Take by mouth. 1 tsp by mouth once a day   MINOXIDIL FOR MEN EX Apply topically. Takes as needed at night.   mirtazapine (REMERON) 15 MG tablet Take 7.5 mg by mouth as needed. Takes 1/4 tablet at bedtime for itching of scalp   Multiple Vitamins-Minerals (CENTRUM SILVER PO) Take 1 tablet by mouth daily.   Multiple Vitamins-Minerals (PRESERVISION AREDS 2 PO) Take 1 tablet by  mouth 2 (two) times daily.    potassium chloride (MICRO-K) 10 MEQ CR capsule Take 1 capsule (10 mEq total) by mouth with breakfast, with lunch, and with evening meal.   pravastatin (PRAVACHOL) 40 MG tablet TAKE 1 TABLET ONCE DAILY.   Probiotic Product (PROBIOTIC DAILY PO) Take 1 tablet by mouth daily.   telmisartan-hydrochlorothiazide (MICARDIS HCT) 80-12.5 MG tablet TAKE 1 TABLET ONCE DAILY.   traMADol (ULTRAM) 50 MG tablet TAKE ONE TABLET EVERY 6 HOURS AS NEEDED.   tretinoin (RETIN-A) 0.05 % cream Once daily   XARELTO 20 MG TABS tablet TAKE 1 TABLET DAILY WITH SUPPER.   zinc gluconate 50 MG tablet Take 50 mg by mouth daily as needed.   No facility-administered encounter medications on file as of 11/01/2020.     Star Rating Drugs: Telmisartan-hydrochlorothiazide 10/13/20 90ds   A general adherence call was made to the patient to ask how she has been doing since her last visit with the clinical pharmacist Mendel Ryder. The patient states that she has been doing well and she is not having any new health issues at this time. Patient was very appreciative of the call. Let the patient know that I will let Mendel Ryder know she is doing well.   Wendy Poet, [place] [phone]   Time spent:20

## 2020-11-08 ENCOUNTER — Other Ambulatory Visit: Payer: Self-pay

## 2020-11-08 ENCOUNTER — Ambulatory Visit (INDEPENDENT_AMBULATORY_CARE_PROVIDER_SITE_OTHER): Payer: Medicare Other

## 2020-11-08 VITALS — BP 120/78 | HR 71 | Temp 98.3°F | Ht 63.0 in | Wt 153.6 lb

## 2020-11-08 DIAGNOSIS — Z0000 Encounter for general adult medical examination without abnormal findings: Secondary | ICD-10-CM | POA: Diagnosis not present

## 2020-11-08 NOTE — Patient Instructions (Addendum)
Colleen White , Thank you for taking time to come for your Medicare Wellness Visit. I appreciate your ongoing commitment to your health goals. Please review the following plan we discussed and let me know if I can assist you in the future.   Screening recommendations/referrals: Colonoscopy: 10/13/2020; no repeat due to age Mammogram: 12/2019 07/2020; are done at [facility]; checked every 6 months Bone Density: 01/07/2018; due every 2 years Recommended yearly ophthalmology/optometry visit for glaucoma screening and checkup Recommended yearly dental visit for hygiene and checkup  Vaccinations: Influenza vaccine: 08/01/2020 Pneumococcal vaccine: 12/15/2014, 12/21/2015 Tdap vaccine: 09/30/2012; due every 10 years Shingles vaccine: 09/03/2017, 11/04/2017   Covid-19: 09/12/2019, 10/03/2019, 06/13/2020  Advanced directives: Please bring a copy of your health care power of attorney and living will to the office at your convenience.  Conditions/risks identified: Yes; Reviewed health maintenance screenings with patient today and relevant education, vaccines, and/or referrals were provided. Please continue to do your personal lifestyle choices by: daily care of teeth and gums, regular physical activity (goal should be 5 days a week for 30 minutes), eat a healthy diet, avoid tobacco and drug use, limiting any alcohol intake, taking a low-dose aspirin (if not allergic or have been advised by your provider otherwise) and taking vitamins and minerals as recommended by your provider. Continue doing brain stimulating activities (puzzles, reading, adult coloring books, staying active) to keep memory sharp. Continue to eat heart healthy diet (full of fruits, vegetables, whole grains, lean protein, water--limit salt, fat, and sugar intake) and increase physical activity as tolerated.  Next appointment: Please schedule your next Medicare Wellness Visit with your Nurse Health Advisor in 1 year by calling [phone].   Preventive  Care 3 Years and Older, Female Preventive care refers to lifestyle choices and visits with your health care provider that can promote health and wellness. What does preventive care include?  A yearly physical exam. This is also called an annual well check.  Dental exams once or twice a year.  Routine eye exams. Ask your health care provider how often you should have your eyes checked.  Personal lifestyle choices, including:  Daily care of your teeth and gums.  Regular physical activity.  Eating a healthy diet.  Avoiding tobacco and drug use.  Limiting alcohol use.  Practicing safe sex.  Taking low-dose aspirin every day.  Taking vitamin and mineral supplements as recommended by your health care provider. What happens during an annual well check? The services and screenings done by your health care provider during your annual well check will depend on your age, overall health, lifestyle risk factors, and family history of disease. Counseling  Your health care provider may ask you questions about your:  Alcohol use.  Tobacco use.  Drug use.  Emotional well-being.  Home and relationship well-being.  Sexual activity.  Eating habits.  History of falls.  Memory and ability to understand (cognition).  Work and work Statistician.  Reproductive health. Screening  You may have the following tests or measurements:  Height, weight, and BMI.  Blood pressure.  Lipid and cholesterol levels. These may be checked every 5 years, or more frequently if you are over 62 years old.  Skin check.  Lung cancer screening. You may have this screening every year starting at age 105 if you have a 30-pack-year history of smoking and currently smoke or have quit within the past 15 years.  Fecal occult blood test (FOBT) of the stool. You may have this test every year starting at age  50.  Flexible sigmoidoscopy or colonoscopy. You may have a sigmoidoscopy every 5 years or a  colonoscopy every 10 years starting at age 47.  Hepatitis C blood test.  Hepatitis B blood test.  Sexually transmitted disease (STD) testing.  Diabetes screening. This is done by checking your blood sugar (glucose) after you have not eaten for a while (fasting). You may have this done every 1-3 years.  Bone density scan. This is done to screen for osteoporosis. You may have this done starting at age 78.  Mammogram. This may be done every 1-2 years. Talk to your health care provider about how often you should have regular mammograms. Talk with your health care provider about your test results, treatment options, and if necessary, the need for more tests. Vaccines  Your health care provider may recommend certain vaccines, such as:  Influenza vaccine. This is recommended every year.  Tetanus, diphtheria, and acellular pertussis (Tdap, Td) vaccine. You may need a Td booster every 10 years.  Zoster vaccine. You may need this after age 32.  Pneumococcal 13-valent conjugate (PCV13) vaccine. One dose is recommended after age 28.  Pneumococcal polysaccharide (PPSV23) vaccine. One dose is recommended after age 51. Talk to your health care provider about which screenings and vaccines you need and how often you need them. This information is not intended to replace advice given to you by your health care provider. Make sure you discuss any questions you have with your health care provider. Document Released: 09/09/2015 Document Revised: 05/02/2016 Document Reviewed: 06/14/2015 Elsevier Interactive Patient Education  2017 [place] Prevention in the Home Falls can cause injuries. They can happen to people of all ages. There are many things you can do to make your home safe and to help prevent falls. What can I do on the outside of my home?  Regularly fix the edges of walkways and driveways and fix any cracks.  Remove anything that might make you trip as you walk through a door,  such as a raised step or threshold.  Trim any bushes or trees on the path to your home.  Use bright outdoor lighting.  Clear any walking paths of anything that might make someone trip, such as rocks or tools.  Regularly check to see if handrails are loose or broken. Make sure that both sides of any steps have handrails.  Any raised decks and porches should have guardrails on the edges.  Have any leaves, snow, or ice cleared regularly.  Use sand or salt on walking paths during winter.  Clean up any spills in your garage right away. This includes oil or grease spills. What can I do in the bathroom?  Use night lights.  Install grab bars by the toilet and in the tub and shower. Do not use towel bars as grab bars.  Use non-skid mats or decals in the tub or shower.  If you need to sit down in the shower, use a plastic, non-slip stool.  Keep the floor dry. Clean up any water that spills on the floor as soon as it happens.  Remove soap buildup in the tub or shower regularly.  Attach bath mats securely with double-sided non-slip rug tape.  Do not have throw rugs and other things on the floor that can make you trip. What can I do in the bedroom?  Use night lights.  Make sure that you have a light by your bed that is easy to reach.  Do not use any sheets  or blankets that are too big for your bed. They should not hang down onto the floor.  Have a firm chair that has side arms. You can use this for support while you get dressed.  Do not have throw rugs and other things on the floor that can make you trip. What can I do in the kitchen?  Clean up any spills right away.  Avoid walking on wet floors.  Keep items that you use a lot in easy-to-reach places.  If you need to reach something above you, use a strong step stool that has a grab bar.  Keep electrical cords out of the way.  Do not use floor polish or wax that makes floors slippery. If you must use wax, use non-skid  floor wax.  Do not have throw rugs and other things on the floor that can make you trip. What can I do with my stairs?  Do not leave any items on the stairs.  Make sure that there are handrails on both sides of the stairs and use them. Fix handrails that are broken or loose. Make sure that handrails are as long as the stairways.  Check any carpeting to make sure that it is firmly attached to the stairs. Fix any carpet that is loose or worn.  Avoid having throw rugs at the top or bottom of the stairs. If you do have throw rugs, attach them to the floor with carpet tape.  Make sure that you have a light switch at the top of the stairs and the bottom of the stairs. If you do not have them, ask someone to add them for you. What else can I do to help prevent falls?  Wear shoes that:  Do not have high heels.  Have rubber bottoms.  Are comfortable and fit you well.  Are closed at the toe. Do not wear sandals.  If you use a stepladder:  Make sure that it is fully opened. Do not climb a closed stepladder.  Make sure that both sides of the stepladder are locked into place.  Ask someone to hold it for you, if possible.  Clearly mark and make sure that you can see:  Any grab bars or handrails.  First and last steps.  Where the edge of each step is.  Use tools that help you move around (mobility aids) if they are needed. These include:  Canes.  Walkers.  Scooters.  Crutches.  Turn on the lights when you go into a dark area. Replace any light bulbs as soon as they burn out.  Set up your furniture so you have a clear path. Avoid moving your furniture around.  If any of your floors are uneven, fix them.  If there are any pets around you, be aware of where they are.  Review your medicines with your doctor. Some medicines can make you feel dizzy. This can increase your chance of falling. Ask your doctor what other things that you can do to help prevent falls. This  information is not intended to replace advice given to you by your health care provider. Make sure you discuss any questions you have with your health care provider. Document Released: 06/09/2009 Document Revised: 01/19/2016 Document Reviewed: 09/17/2014 Elsevier Interactive Patient Education  2017 [employer].

## 2020-11-08 NOTE — Progress Notes (Signed)
Subjective:   Colleen White is a 74 y.o. female who presents for Medicare Annual (Subsequent) preventive examination.  Review of Systems    No ROS. Medicare Wellness Visit. Additional risk factors are reflected in social history. Cardiac Risk Factors include: advanced age (>73mn, >>31women);dyslipidemia;hypertension;family history of premature cardiovascular disease     Objective:    Today's Vitals   11/08/20 1027 11/08/20 1028  BP: 120/78   Pulse: 71   Temp: 98.3 F (36.8 C)   SpO2: 94%   Weight: 153 lb 9.6 oz (69.7 kg)   Height: 5' 3"  (1.6 m)   PainSc: 0-No pain 0-No pain   Body mass index is 27.21 kg/m.  Advanced Directives 11/08/2020 11/11/2019 10/23/2018 09/24/2018 01/18/2018 07/19/2017 01/13/2017  Does Patient Have a Medical Advance Directive? Yes No No Yes Yes Yes Yes  Type of Advance Directive Living will;Healthcare Power of [place] will [place] will [place] will  Does patient want to make changes to medical advance directive? No - Patient declined - - - - - -  Copy of H[place] Chart? No - copy requested - - No - copy requested No - copy requested - Yes  Would patient like information on creating a medical advance directive? - No - Patient declined No - Patient declined - - - -    Current Medications (verified) Outpatient Encounter Medications as of 11/08/2020  Medication Sig  . Ascorbic Acid (VITAMIN C PO) Take 500 mg by mouth daily.   . brimonidine-timolol (COMBIGAN) 0.2-0.5 % ophthalmic solution Place 1 drop into both eyes every 12 (twelve) hours.   . calcium citrate (CALCITRATE - DOSED IN MG ELEMENTAL CALCIUM) 950 (200 Ca) MG tablet Take 200 mg of elemental calcium by mouth 2 (two) times daily.  . Cholecalciferol (VITAMIN D3) 1000 units CAPS Take 1,000 Units by mouth.   . Coenzyme Q10 (CO Q 10) 100 MG  CAPS Take 1 tablet by mouth daily.   .Marland Kitchendicyclomine (BENTYL) 10 MG capsule Take 10 mg by mouth as needed for spasms.  .Marland Kitchendiltiazem (CARDIZEM CD) 120 MG 24 hr capsule TAKE (1) CAPSULE DAILY. (Patient taking differently: TAKE (1) CAPSULE DAILY.)  . docusate sodium (COLACE) 100 MG capsule Take 200 mg by mouth as needed.  .Marland Kitchenesomeprazole (NEXIUM) 20 MG packet Take 20 mg by mouth at bedtime. Takes as needed.  . gabapentin (NEURONTIN) 100 MG capsule Take 3 capsules (300 mg total) by mouth at bedtime.  .Marland Kitchenloratadine (CLARITIN) 10 MG tablet Take by mouth daily as needed.   .Marland KitchenMETAMUCIL FIBER PO Take by mouth. 1 tsp by mouth once a day  . MINOXIDIL FOR MEN EX Apply topically. Takes as needed at night.  . mirtazapine (REMERON) 15 MG tablet Take 7.5 mg by mouth as needed. Takes 1/4 tablet at bedtime for itching of scalp  . Multiple Vitamins-Minerals (CENTRUM SILVER PO) Take 1 tablet by mouth daily.  . Multiple Vitamins-Minerals (PRESERVISION AREDS 2 PO) Take 1 tablet by mouth 2 (two) times daily.   . potassium chloride (MICRO-K) 10 MEQ CR capsule Take 1 capsule (10 mEq total) by mouth with breakfast, with lunch, and with evening meal.  . pravastatin (PRAVACHOL) 40 MG tablet TAKE 1 TABLET ONCE DAILY.  . Probiotic Product (PROBIOTIC DAILY PO) Take 1 tablet by mouth daily.  .Marland Kitchentelmisartan-hydrochlorothiazide (MICARDIS HCT) 80-12.5 MG tablet TAKE 1 TABLET ONCE DAILY.  .[name]  traMADol (ULTRAM) 50 MG tablet TAKE ONE TABLET EVERY 6 HOURS AS NEEDED.  [name] tretinoin (RETIN-A) 0.05 % cream Once daily  . XARELTO 20 MG TABS tablet TAKE 1 TABLET DAILY WITH SUPPER.  . [DISCONTINUED] zinc gluconate 50 MG tablet Take 50 mg by mouth daily as needed.   No facility-administered encounter medications on file as of 11/08/2020.    Allergies (verified) Patient has no known allergies.   History: Past Medical History:  Diagnosis Date  . Allergic rhinitis, cause unspecified   . Alopecia   . Anxiety   . Colon polyps   . Diverticulosis  of colon (without mention of hemorrhage)   . Fibrocystic breast disease   . GERD (gastroesophageal reflux disease)   . Glaucoma   . Headache(784.0)   . Hemorrhoids   . History of echocardiogram    Echo 3/17:  Vigorous LVF, EF 65-70%, normal wall motion, grade 1 diastolic, mildly elevated LVOT velocity of 2.2 m/s-likely related to vigorous LVF  . History of stress test    Myoview normal in 2/05. //  Stress echo (11/15) with no evidence for ischemia or infarction.  [name] HLD (hyperlipidemia)   . HTN (hypertension)   . Hypertension   . Mitral valve disorders(424.0)   . Non Hodgkin's lymphoma ([place])    Treated at [facility] with surgery and XRT.  Did not have chemotherapy.   . OSA (obstructive sleep apnea) 03/29/2016  . Osteopenia   . PAF (paroxysmal atrial fibrillation) ([place])    a. CHADS2-VASc=3 //  b. Xarelto started in 2017  . Pulmonary nodule    Presumed benign after observation.   . Sleep apnea    CPAP  . Unspecified glaucoma(365.9)   . Varicose vein of leg    Past Surgical History:  Procedure Laterality Date  . ABDOMINAL HYSTERECTOMY  1996  . BASAL CELL CARCINOMA EXCISION     06/2017  . BREAST CYST ASPIRATION  2017  . dental implant  12/21/2019   #31  . EYE SURGERY  2009   Cataract  . 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 Cloyd Stagers, [facility]  . s/p ORIF prox phalanx  02/2007   right little finger----Dr Fredna Dow   Family History  Problem Relation Age of Onset  . Hypertension Mother   . Breast cancer Mother        double mastectomy  . Heart disease Mother   . Diabetes Father   . Heart disease Father   . Prostate cancer Father   . Esophageal cancer Father   . Breast cancer Sister        breast (sister), spread to brain  . Colon cancer Maternal Uncle    Social History   Socioeconomic History  . Marital status: Married    Spouse name: Not on file  . Number of children: 0  . Years of education: Not on file  . Highest education level: Not on file   Occupational History  . Not on file  Tobacco Use  . Smoking status: Former Smoker    Quit date: 08/27/1974    Years since quitting: 46.2  . Smokeless tobacco: Never Used  Vaping Use  . Vaping Use: Never used  Substance and Sexual Activity  . Alcohol use: No  . Drug use: No  . Sexual activity: Yes  Other Topics Concern  . Not on file  Social History Narrative   Lives at home with spouse   Retired   Caffeine: 3-4 cups/day   Social  Determinants of Health   Financial Resource Strain: Low Risk   . Difficulty of Paying Living Expenses: Not hard at all  Food Insecurity: No Food Insecurity  . Worried About Charity fundraiser in the Last Year: Never true  . Ran Out of Food in the Last Year: Never true  Transportation Needs: No Transportation Needs  . Lack of Transportation (Medical): No  . Lack of Transportation (Non-Medical): No  Physical Activity: Sufficiently Active  . Days of Exercise per Week: 5 days  . Minutes of Exercise per Session: 30 min  Stress: No Stress Concern Present  . Feeling of Stress : Not at all  Social Connections: Socially Integrated  . Frequency of Communication with Friends and Family: More than three times a week  . Frequency of Social Gatherings with Friends and Family: More than three times a week  . Attends Religious Services: More than 4 times per year  . Active Member of Clubs or Organizations: Yes  . Attends Archivist Meetings: More than 4 times per year  . 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 no  Interpreter Needed?: No  Information entered by :: Lisette Abu, LPN   Activities of Daily Living In your present state of health, do you have any difficulty performing the following activities: 11/08/2020 08/01/2020  Hearing? N N  Vision? N N  Difficulty concentrating or making decisions? N N  Walking or climbing stairs? N N  Dressing or bathing? N N  Doing errands, shopping? N N  Preparing Food and eating ? N -  Using the Toilet? N -  In the past six months, have you accidently leaked urine? N -  Do you have problems with loss of bowel control? N -  Managing your Medications? N -  Managing your Finances? N -  Housekeeping or managing your Housekeeping? N -  Some recent data might be hidden    Patient Care Team: Janith Lima, MD as PCP - General (Internal Medicine) Sueanne Margarita, MD as PCP - Sleep Medicine (Sleep Medicine) Larey Dresser, MD as PCP - Cardiology (Cardiology) Charlton Haws, [facility] as Pharmacist (Pharmacist) Luberta Mutter, MD as Consulting Physician (Ophthalmology)  Indicate any recent Medical Services you may have received from other than [facility] providers in the past year (date may be approximate).     Assessment:   This is a routine wellness examination for [place].  Hearing/Vision screen No exam data present  Dietary issues and exercise activities discussed: Current Exercise Habits: Structured exercise class, Type of exercise: walking;treadmill;stretching;strength training/weights, Time (Minutes): 30, Frequency (Times/Week): 5, Weekly Exercise (Minutes/Week): 150, Intensity: Mild, Exercise limited by: Other - see comments (vascular condition: varicose veins)  Goals    . Patient Stated     Eat healthy and exercise, drink more water.    . Patient Stated     I would like to maintain my independence.    . Pharmacy Care Plan  CARE PLAN ENTRY (see  longitudinal plan of care for additional care plan information)  Current Barriers:  . Chronic Disease Management support, education, and care coordination needs related to Hypertension, Hyperlipidemia, Atrial Fibrillation, and GERD   Hypertension BP Readings from Last 3 Encounters:  05/11/20 106/60  01/28/20 126/78  01/11/20 103/70 .  Pharmacist Clinical Goal(s): o Over the next 180 days, patient will work with PharmD and providers to maintain BP goal <130/80 . Current regimen:  o diltiazem 120 mg daily,  o telmisartan/HCTZ 80-12.5 mg daily,  o potassium 10 mEq 3 times daily . Interventions: o Discussed BP goals and benefits of medications for prevention of heart attack / stroke . Patient self care activities - Over the next 180 days, patient will: o Check BP as needed, document, and provide at future appointments o Ensure daily salt intake < 2300 mg/day  Hyperlipidemia Lab Results  Component Value Date/Time   LDLCALC 73 11/03/2019 01:09 PM   LDLDIRECT 142.5 09/30/2012 11:10 AM .  Pharmacist Clinical Goal(s): o Over the next 180 days, patient will work with PharmD and providers to maintain LDL goal < 100 . Current regimen:  o pravastatin 40 mg daily,  o CoQ10 . Interventions: o Discussed cholesterol goals and benefits of medications for prevention of heart attack / stroke . Patient self care activities - Over the next 180 days, patient will: o Continue current medications  AFIB . Pharmacist Clinical Goal(s) o Over the next 180 days, patient will work with PharmD and providers to optimize therapy . Current regimen:  o diltiazem 120 mg daily o Xarelto 20 mg daily  . Interventions: o Discussed benefits of medications for stroke prevention as well as bleeding risk associated with Xarelto o Advised to avoid NSAIDs due to bleeding risk with Xarelto . Patient self care activities - Over the next 180 days, patient will: o Continue current medications o Avoid  NSAIDs  GERD . Pharmacist Clinical Goal(s) o Over the next 180 days, patient will work with PharmD and providers to optimize therapy . Current regimen:  o Esomeprazole 20 mg packet daily at bedtime . Interventions: o Discussed optimal timing of PPI . Patient self care activities - Over the next 180 days, patient will: o Continue medication as directed  Medication management . Pharmacist Clinical Goal(s): o Over the next 180 days, patient will work with PharmD and providers to maintain optimal medication adherence . Current pharmacy: [facility] . Interventions o Comprehensive medication review performed. o Continue current medication management strategy . Patient self care activities - Over the next 180 days, patient will: o Focus on medication adherence by pill box o Take medications as prescribed o Report any questions or concerns to PharmD and/or provider(s)  Please see past updates related to this goal by clicking on the "Past Updates" button in the selected goal       Depression Screen PHQ 2/9 Scores 11/08/2020 04/20/2019 09/24/2018 09/10/2018 02/18/2018 01/13/2017 12/26/2016  PHQ - 2 Score 0 0 0 1 0 0 0  PHQ- 9 Score - 1 2 5  0 - 0    Fall Risk Fall Risk  11/08/2020 09/05/2020 04/20/2019 09/24/2018 09/10/2018  Falls in the past year? 0 0 0 0 0  Comment - - - - -  Number falls in past yr: 0 - 0 0 0  Comment - - - - -  Injury with Fall? 0 - 0 - 0  Risk for fall due to : No Fall Risks - - - -  Follow up Falls evaluation completed - Falls evaluation completed - Falls evaluation completed    FALL RISK PREVENTION PERTAINING TO THE HOME:  Any stairs in or around the home? Yes  If so, are there any without handrails? No  Home free of loose throw rugs in walkways, pet beds, electrical cords, etc? Yes  Adequate lighting in your home to reduce risk of falls? Yes   ASSISTIVE DEVICES UTILIZED TO PREVENT FALLS:  Life alert? Yes  Use of a cane, walker or w/c? No  Grab bars in  the bathroom? Yes  Shower chair or bench in shower? Yes  Elevated toilet seat or a handicapped toilet? Yes   TIMED UP AND GO:  Was the test performed? No .  Length of time to ambulate 10 feet: 0 sec.   Gait steady and fast without use of assistive device  Cognitive Function: Normal cognitive status assessed by direct observation by this Nurse Health Advisor. No abnormalities found.          Immunizations Immunization History  Administered Date(s) Administered  . Fluad Quad(high Dose 65+) 04/20/2019, 08/01/2020  . Influenza Split 09/05/2011, 07/30/2012  . Influenza, High Dose Seasonal PF 10/11/2015, 09/11/2016, 06/25/2017, 06/19/2018  . Influenza,inj,Quad PF,6+ Mos 08/26/2014  . Influenza-Unspecified 06/27/2013  . PFIZER(Purple Top)SARS-COV-2 Vaccination 09/12/2019, 10/03/2019, 06/13/2020  . Pneumococcal Conjugate-13 12/15/2014  . Pneumococcal Polysaccharide-23 12/21/2015  . Tdap 09/30/2012  . Zoster 09/30/2009  . Zoster Recombinat (Shingrix) 09/03/2017, 11/04/2017    TDAP status: Up to date  Flu Vaccine status: Up to date  Pneumococcal vaccine status: Up to date  Covid-19 vaccine status: Completed vaccines  Qualifies for Shingles Vaccine? Yes   Zostavax completed Yes   Shingrix Completed?: Yes  Screening Tests Health Maintenance  Topic Date Due  . COVID-19 Vaccine (4 - Booster for Pfizer series) 12/12/2020  . TETANUS/TDAP  09/30/2022  . INFLUENZA VACCINE  Completed  . DEXA SCAN  Completed  . Hepatitis C Screening  Completed  . PNA vac Low Risk Adult  Completed  . HPV VACCINES  Aged Out    Health Maintenance  There are no preventive care reminders to display for this patient.  Colorectal cancer screening: Type of screening: CT Colonography. Completed 10/13/2020. Repeat every 0 (no repeat) years  Mammogram status: Completed 07/2020. Repeat every year  Bone Density status: Completed 01/07/2018. Results reflect: Bone density results: NORMAL. Repeat every 2-3  years.  Lung Cancer Screening: (Low Dose CT Chest recommended if Age 75-80 years, 30 pack-year currently smoking OR have quit w/in 15years.) does not qualify.   Lung Cancer Screening Referral: no  Additional Screening:  Hepatitis C Screening: does qualify; Completed yes  Vision Screening: Recommended annual ophthalmology exams for early detection of glaucoma and other disorders of the eye. Is the patient up to date with their annual eye exam?  Yes  Who is the provider or what is the name of the office in which the patient attends annual eye exams? Luberta Mutter, MD. If pt is not established with a provider, would they like to be referred to a provider to establish care? No .   Dental Screening: Recommended annual dental exams for proper oral hygiene  Community Resource Referral / Chronic Care Management: CRR required this visit?  No   CCM required this visit?  No      Plan:     I have personally reviewed and noted the following in the patient's chart:   . Medical and social history . Use of alcohol,  tobacco or illicit drugs  . Current medications and supplements . Functional ability and status . Nutritional status . Physical activity . Advanced directives . List of other physicians . Hospitalizations, surgeries, and ER visits in previous 12 months . Vitals . Screenings to include cognitive, depression, and falls . Referrals and appointments  In addition, I have reviewed and discussed with patient certain preventive protocols, quality metrics, and best practice recommendations. A written personalized care plan for preventive services as well as general preventive health recommendations were provided to patient.     Sheral Flow, LPN   [id number]   Nurse Notes:  Medications reviewed with patient; no opioid use noted.

## 2020-11-15 ENCOUNTER — Telehealth: Payer: Medicare Other

## 2020-11-17 ENCOUNTER — Other Ambulatory Visit: Payer: Self-pay | Admitting: Internal Medicine

## 2020-11-17 DIAGNOSIS — E7800 Pure hypercholesterolemia, unspecified: Secondary | ICD-10-CM

## 2020-11-23 ENCOUNTER — Other Ambulatory Visit: Payer: Self-pay | Admitting: Internal Medicine

## 2020-11-23 DIAGNOSIS — M159 Polyosteoarthritis, unspecified: Secondary | ICD-10-CM

## 2020-11-23 DIAGNOSIS — M8949 Other hypertrophic osteoarthropathy, multiple sites: Secondary | ICD-10-CM

## 2020-12-14 ENCOUNTER — Telehealth: Payer: Medicare Other

## 2020-12-27 DIAGNOSIS — H353121 Nonexudative age-related macular degeneration, left eye, early dry stage: Secondary | ICD-10-CM | POA: Diagnosis not present

## 2020-12-27 DIAGNOSIS — H40013 Open angle with borderline findings, low risk, bilateral: Secondary | ICD-10-CM | POA: Diagnosis not present

## 2020-12-27 DIAGNOSIS — H40053 Ocular hypertension, bilateral: Secondary | ICD-10-CM | POA: Diagnosis not present

## 2020-12-27 DIAGNOSIS — H52203 Unspecified astigmatism, bilateral: Secondary | ICD-10-CM | POA: Diagnosis not present

## 2020-12-29 ENCOUNTER — Telehealth: Payer: Self-pay | Admitting: Pharmacist

## 2020-12-29 NOTE — Chronic Care Management (AMB) (Signed)
Chronic Care Management Pharmacy Assistant   Name: Colleen White  MRN: [id number] DOB: 12-19-1945   Reason for Encounter: General Disease State Call    Recent office visits:  None ID  Recent consult visits:  None ID  Hospital visits:  None in previous 6 months  Medications: Outpatient Encounter Medications as of 12/29/2020  Medication Sig  . Ascorbic Acid (VITAMIN C PO) Take 500 mg by mouth daily.   . brimonidine-timolol (COMBIGAN) 0.2-0.5 % ophthalmic solution Place 1 drop into both eyes every 12 (twelve) hours.   . calcium citrate (CALCITRATE - DOSED IN MG ELEMENTAL CALCIUM) 950 (200 Ca) MG tablet Take 200 mg of elemental calcium by mouth 2 (two) times daily.  . Cholecalciferol (VITAMIN D3) 1000 units CAPS Take 1,000 Units by mouth.   . Coenzyme Q10 (CO Q 10) 100 MG CAPS Take 1 tablet by mouth daily.   [name] dicyclomine (BENTYL) 10 MG capsule Take 10 mg by mouth as needed for spasms.  [name] diltiazem (CARDIZEM CD) 120 MG 24 hr capsule TAKE (1) CAPSULE DAILY. (Patient taking differently: TAKE (1) CAPSULE DAILY.)  . docusate sodium (COLACE) 100 MG capsule Take 200 mg by mouth as needed.  [name] esomeprazole (NEXIUM) 20 MG packet Take 20 mg by mouth at bedtime. Takes as needed.  . gabapentin (NEURONTIN) 100 MG capsule Take 3 capsules (300 mg total) by mouth at bedtime.  [name] loratadine (CLARITIN) 10 MG tablet Take by mouth daily as needed.   [name] METAMUCIL FIBER PO Take by mouth. 1 tsp by mouth once a day  . MINOXIDIL FOR MEN EX Apply topically. Takes as needed at night.  . mirtazapine (REMERON) 15 MG tablet Take 7.5 mg by mouth as needed. Takes 1/4 tablet at bedtime for itching of scalp  . Multiple Vitamins-Minerals (CENTRUM SILVER PO) Take 1 tablet by mouth daily.  . Multiple Vitamins-Minerals (PRESERVISION AREDS 2 PO) Take 1 tablet by mouth 2 (two) times daily.   . potassium chloride (MICRO-K) 10 MEQ CR capsule Take 1 capsule (10 mEq total) by mouth with breakfast, with lunch, and with  evening meal.  . pravastatin (PRAVACHOL) 40 MG tablet TAKE 1 TABLET ONCE DAILY.  . Probiotic Product (PROBIOTIC DAILY PO) Take 1 tablet by mouth daily.  [name] telmisartan-hydrochlorothiazide (MICARDIS HCT) 80-12.5 MG tablet TAKE 1 TABLET ONCE DAILY.  . traMADol (ULTRAM) 50 MG tablet TAKE ONE TABLET EVERY 6 HOURS AS NEEDED.  [name] tretinoin (RETIN-A) 0.05 % cream Once daily  . XARELTO 20 MG TABS tablet TAKE 1 TABLET DAILY WITH SUPPER.   No facility-administered encounter medications on file as of 12/29/2020.   Have you had any problems recently with your health? Patient states that the day before yesterday she was having a bad headache which she thinks was due to the fact her blood pressure was a little elevated 140/88. Patient stated that she took a tramadol and two tylenol and that seemed to help. She stated that she checked her blood pressure yesterday and it was 132/74, and she took another tramadol and tylenol and her headache is gone.  Have you had any problems with your pharmacy? Patient states that she does not have any problems with getting medications or the cost of medications from the pharmacy  What issues or side effects are you having with your medications? Patient states that she does not have any side effects from medications  What would you like me to pass along to [facility] for them to help you with?  Patient states that overall she is doing well and does not have any concerns about her health at this time. States that she does have an appointment coming up with Dr. Ronnald Ramp in July.  What can we do to take care of you better? Patient states if she has any changes in her health she will call Dr. Ronnald Ramp office   Star Rating Drugs: Pravastatin 11/17/20 90 ds Telmisartan-hctz 90 ds  [place] Pharmacist Assistant [phone]  Time spent:30

## 2021-01-02 DIAGNOSIS — L219 Seborrheic dermatitis, unspecified: Secondary | ICD-10-CM | POA: Diagnosis not present

## 2021-01-02 DIAGNOSIS — L821 Other seborrheic keratosis: Secondary | ICD-10-CM | POA: Diagnosis not present

## 2021-01-02 DIAGNOSIS — L299 Pruritus, unspecified: Secondary | ICD-10-CM | POA: Diagnosis not present

## 2021-01-02 DIAGNOSIS — R208 Other disturbances of skin sensation: Secondary | ICD-10-CM | POA: Diagnosis not present

## 2021-01-02 DIAGNOSIS — L658 Other specified nonscarring hair loss: Secondary | ICD-10-CM | POA: Diagnosis not present

## 2021-01-03 DIAGNOSIS — Z23 Encounter for immunization: Secondary | ICD-10-CM | POA: Diagnosis not present

## 2021-01-05 ENCOUNTER — Other Ambulatory Visit: Payer: Self-pay

## 2021-01-05 ENCOUNTER — Encounter (HOSPITAL_COMMUNITY): Payer: Self-pay | Admitting: Cardiology

## 2021-01-05 ENCOUNTER — Ambulatory Visit (HOSPITAL_COMMUNITY)
Admission: RE | Admit: 2021-01-05 | Discharge: 2021-01-05 | Disposition: A | Discharge status: Home / Self Care | Payer: Medicare Other | Source: Ambulatory Visit | Attending: Cardiology | Admitting: Cardiology

## 2021-01-05 VITALS — HR 75 | Wt 155.8 lb

## 2021-01-05 DIAGNOSIS — Z8249 Family history of ischemic heart disease and other diseases of the circulatory system: Secondary | ICD-10-CM | POA: Insufficient documentation

## 2021-01-05 DIAGNOSIS — G4733 Obstructive sleep apnea (adult) (pediatric): Secondary | ICD-10-CM | POA: Insufficient documentation

## 2021-01-05 DIAGNOSIS — R519 Headache, unspecified: Secondary | ICD-10-CM | POA: Insufficient documentation

## 2021-01-05 DIAGNOSIS — Z7901 Long term (current) use of anticoagulants: Secondary | ICD-10-CM | POA: Diagnosis not present

## 2021-01-05 DIAGNOSIS — I119 Hypertensive heart disease without heart failure: Secondary | ICD-10-CM | POA: Diagnosis not present

## 2021-01-05 DIAGNOSIS — E785 Hyperlipidemia, unspecified: Secondary | ICD-10-CM | POA: Insufficient documentation

## 2021-01-05 DIAGNOSIS — Z79899 Other long term (current) drug therapy: Secondary | ICD-10-CM | POA: Diagnosis not present

## 2021-01-05 DIAGNOSIS — I480 Paroxysmal atrial fibrillation: Secondary | ICD-10-CM | POA: Diagnosis not present

## 2021-01-05 DIAGNOSIS — Z8572 Personal history of non-Hodgkin lymphomas: Secondary | ICD-10-CM | POA: Diagnosis not present

## 2021-01-05 LAB — CBC
HCT: 39.5 % (ref 36.0–46.0)
Hemoglobin: 13.4 g/dL (ref 12.0–15.0)
MCH: 31.3 pg (ref 26.0–34.0)
MCHC: 33.9 g/dL (ref 30.0–36.0)
MCV: 92.3 fL (ref 80.0–100.0)
Platelets: 191 10*3/uL (ref 150–400)
RBC: 4.28 MIL/uL (ref 3.87–5.11)
RDW: 12.2 % (ref 11.5–15.5)
WBC: 4.1 10*3/uL (ref 4.0–10.5)
nRBC: 0 % (ref 0.0–0.2)

## 2021-01-05 LAB — BASIC METABOLIC PANEL
Anion gap: 5 (ref 5–15)
BUN: 12 mg/dL (ref 8–23)
CO2: 30 mmol/L (ref 22–32)
Calcium: 9.3 mg/dL (ref 8.9–10.3)
Chloride: 103 mmol/L (ref 98–111)
Creatinine, Ser: 0.89 mg/dL (ref 0.44–1.00)
GFR, Estimated: >60 mL/min (ref >60)
Glucose, Bld: 89 mg/dL (ref 70–99)
Potassium: 3.3 mmol/L — ABNORMAL LOW (ref 3.5–5.1)
Sodium: 138 mmol/L (ref 135–145)

## 2021-01-05 LAB — SEDIMENTATION RATE: Sed Rate: 14 mm/hr (ref 0–22)

## 2021-01-05 NOTE — Progress Notes (Signed)
Patient ID: Colleen White, female   DOB: [birth year], 75 y.o.   MRN: [id number] PCP: Dr. Ronnald Ramp Cardiology: Dr. Aundra Dubin  75 y.o. with history of HTN, hyperlipidemia, paroxysmal atrial fibrillation and prior non-Hodgkins lymphoma presents for followup of atrial fibrillation.  She called the office in 1/17 to report increased episodes of palpitations.  She had had rare tachypalpitations for years, but in 1/17 she had 2 episodes of tachypalpitations close together.  I had her wear an event monitor.  By monitor, her tachypalpitations correlated with atrial fibrillation with RVR.  She was started on Xarelto and Toprol XL.  Toprol XL caused swelling, so it was stopped and diltiazem CD was begun.  Echo in 3/17 showed EF 65-70% with mildly elevated LVOT velocity due to vigorous contraction.  BP is elevated today but she brings readings from home which are generally 502D-741O systolic.  Main complaint is periodic left temple area headache.  On and off for several weeks, not constant.  Resolves with Tylenol.  No palpitations, she is in NSR today.  No chest pain or exertional dyspnea.  She is using CPAP.   Weight is stable.   ECG: NSR, LAE (personally reviewed)  Labs (4/15): K 3.7, creatinine 0.6, LDL 111, HDL 46 Labs (4/16): LDL 89 Labs (3/17): K 3.8, creatinine 0.7, HCT 37.3 Labs (4/17): LDL 93, HDL 46 Labs (8/17): hgb 13.1, K 3.4, creatinine 0.7 Labs (4/18): LDL 99, hgb 13.7 Labs (10/18): K 3.7, creatinine 0.75 Labs (6/19): LDL 97 Labs (8/19): K 3.9, creatinine 0.84 Labs (8/20): K 3.9, creatinine 0.8, LDL 88 Labs (3/21): LDL 73 Labs (6/21): K 3.4, creatinine 0.73 Labs (11/21): LDL 87, K 3.6, creatinine 0.68 Labs (12/21): hgb 12.9  PMH: 1. Echo (11/15): EF 60-65%, trivial MR. Echo (3/17) with EF 65-70%, mildly elevated LVOT velocity due to vigorous contraction. 2. Chest pain: Myoview normal in 2/05. Stress echo (11/15) with no evidence for ischemia or infarction.  3. Suspected glaucoma 4. HTN 5.  Hyperlipidemia 6. GERD 7. Diverticulosis 8. TAH/BSO 9. Non-Hodgkins lymphoma: In remission.  Treated at [facility] with surgery and XRT.  Did not have chemotherapy.  10. Pulmonary nodules: Presumed benign after observation.  11. Atrial fibrillation: Paroxysmal.  12. OSA: Uses CPAP.   SH: Married, cares for elderly mother, nonsmoker.  Lives in [place].  FH: Mother with atrial fibrillation and CAD.   ROS: All systems reviewed and negative except as per HPI.   Current Outpatient Medications  Medication Sig Dispense Refill  . Ascorbic Acid (VITAMIN C PO) Take 500 mg by mouth daily.     . brimonidine-timolol (COMBIGAN) 0.2-0.5 % ophthalmic solution Place 1 drop into both eyes every 12 (twelve) hours.     . calcium citrate (CALCITRATE - DOSED IN MG ELEMENTAL CALCIUM) 950 (200 Ca) MG tablet Take 200 mg of elemental calcium by mouth 2 (two) times daily.    . Cholecalciferol (VITAMIN D3) 1000 units CAPS Take 1,000 Units by mouth.     . Coenzyme Q10 (CO Q 10) 100 MG CAPS Take 1 tablet by mouth daily.     [name] dicyclomine (BENTYL) 10 MG capsule Take 10 mg by mouth as needed for spasms.    [name] diltiazem (CARDIZEM CD) 120 MG 24 hr capsule TAKE (1) CAPSULE DAILY. (Patient taking differently: TAKE (1) CAPSULE DAILY.) 90 capsule 0  . docusate sodium (COLACE) 100 MG capsule Take 200 mg by mouth as needed.    [name] esomeprazole (NEXIUM) 20 MG packet Take 20 mg by mouth at bedtime. Takes  as needed.    . gabapentin (NEURONTIN) 100 MG capsule Take 3 capsules (300 mg total) by mouth at bedtime. 270 capsule 1  . loratadine (CLARITIN) 10 MG tablet Take by mouth daily as needed.     [name] METAMUCIL FIBER PO Take by mouth. 1 tsp by mouth once a day    . MINOXIDIL FOR MEN EX Apply topically. Takes as needed at night.    . mirtazapine (REMERON) 15 MG tablet Take 7.5 mg by mouth as needed. Takes 1/4 tablet at bedtime for itching of scalp    . Multiple Vitamins-Minerals (CENTRUM SILVER PO) Take 1 tablet by mouth daily.    .  Multiple Vitamins-Minerals (PRESERVISION AREDS 2 PO) Take 1 tablet by mouth 2 (two) times daily.     . potassium chloride (MICRO-K) 10 MEQ CR capsule Take 1 capsule (10 mEq total) by mouth with breakfast, with lunch, and with evening meal. 270 capsule 1  . pravastatin (PRAVACHOL) 40 MG tablet TAKE 1 TABLET ONCE DAILY. (Patient taking differently: Take 40 mg by mouth at bedtime.) 90 tablet 1  . Probiotic Product (PROBIOTIC DAILY PO) Take 1 tablet by mouth daily.    [name] telmisartan-hydrochlorothiazide (MICARDIS HCT) 80-12.5 MG tablet TAKE 1 TABLET ONCE DAILY. 90 tablet 1  . traMADol (ULTRAM) 50 MG tablet TAKE ONE TABLET EVERY 6 HOURS AS NEEDED. 65 tablet 3  . tretinoin (RETIN-A) 0.05 % cream Once daily    . XARELTO 20 MG TABS tablet TAKE 1 TABLET DAILY WITH SUPPER. 30 tablet 6   No current facility-administered medications for this encounter.    Pulse 75   Wt 70.7 kg (155 lb 12.8 oz)   SpO2 98%   BMI 27.60 kg/m  General: NAD Neck: No JVD, no thyromegaly or thyroid nodule.  Lungs: Clear to auscultation bilaterally with normal respiratory effort. CV: Nondisplaced PMI.  Heart regular S1/S2, no S3/S4, no murmur.  No peripheral edema.  No carotid bruit.  Normal pedal pulses.  Abdomen: Soft, nontender, no hepatosplenomegaly, no distention.  Skin: Intact without lesions or rashes.  Neurologic: Alert and oriented x 3.  Psych: Normal affect. Extremities: No clubbing or cyanosis.  HEENT: Normal.   Assessment/Plan: 1. HTN: BP high today but readings at home have been normal.  - Bring home cuff in to next appointment to calibrate it with our office cuff.  - Continue telmisartan/HCTZ and diltiazem CD for now.   2. Hyperlipidemia: Lipids in 11/21 were acceptable.  3. Atrial fibrillation: Paroxysmal.  First noted in 1/17.  No symptomatic recurrences recently.   CHADSVASC = 3.  Best route for prevention will be to continue to control OSA and HTN.  - Continue diltiazem CD 120 daily.  - Continue Xarelto  20 daily. BMET and CBC today. - She is now using CPAP. 4. OSA: Using CPAP.  5. Headache: On and off, left temple.  Probably not temporal arteritis but will send ESR. If continues, followup with PCP.   Followup in 6 months.   [name] 01/05/2021

## 2021-01-05 NOTE — Patient Instructions (Signed)
EKG done today.  Labs done today. We will contact you only if your labs are abnormal.  No medication changes were made. Please continue all current medications as prescribed.  Your physician recommends that you schedule a follow-up appointment in: 6 months. Please contact our office in October to schedule a November appointment.    If you have any questions or concerns before your next appointment please send [place] a message through [place] or call our office at [phone].    TO LEAVE A MESSAGE FOR THE NURSE SELECT OPTION 2, PLEASE LEAVE A MESSAGE INCLUDING: . YOUR NAME . DATE OF BIRTH . CALL BACK NUMBER . REASON FOR CALL**this is important as we prioritize the call backs  YOU WILL RECEIVE A CALL BACK THE SAME DAY AS LONG AS YOU CALL BEFORE 4:00 PM   Do the following things EVERYDAY: 1) Weigh yourself in the morning before breakfast. Write it down and keep it in a log. 2) Take your medicines as prescribed 3) Eat low salt foods--Limit salt (sodium) to 2000 mg per day.  4) Stay as active as you can everyday 5) Limit all fluids for the day to less than 2 liters   At the [facility], you and your health needs are our priority. As part of our continuing mission to provide you with exceptional heart care, we have created designated Provider Care Teams. These Care Teams include your primary Cardiologist (physician) and Advanced Practice Providers (APPs- Physician Assistants and Nurse Practitioners) who all work together to provide you with the care you need, when you need it.   You may see any of the following providers on your designated Care Team at your next follow up: [name] Dr Glori Bickers . Dr [name] . Darrick Grinder, NP . Lyda Jester, PA . Audry Riles, PharmD   Please be sure to bring in all your medications bottles to every appointment.

## 2021-01-20 DIAGNOSIS — Z01419 Encounter for gynecological examination (general) (routine) without abnormal findings: Secondary | ICD-10-CM | POA: Diagnosis not present

## 2021-01-20 DIAGNOSIS — R82998 Other abnormal findings in urine: Secondary | ICD-10-CM | POA: Diagnosis not present

## 2021-01-20 DIAGNOSIS — Z124 Encounter for screening for malignant neoplasm of cervix: Secondary | ICD-10-CM | POA: Diagnosis not present

## 2021-01-20 DIAGNOSIS — R3989 Other symptoms and signs involving the genitourinary system: Secondary | ICD-10-CM | POA: Diagnosis not present

## 2021-01-20 DIAGNOSIS — M8580 Other specified disorders of bone density and structure, unspecified site: Secondary | ICD-10-CM | POA: Diagnosis not present

## 2021-01-20 DIAGNOSIS — Z01411 Encounter for gynecological examination (general) (routine) with abnormal findings: Secondary | ICD-10-CM | POA: Diagnosis not present

## 2021-01-20 DIAGNOSIS — Z90710 Acquired absence of both cervix and uterus: Secondary | ICD-10-CM | POA: Diagnosis not present

## 2021-01-20 DIAGNOSIS — Z803 Family history of malignant neoplasm of breast: Secondary | ICD-10-CM | POA: Diagnosis not present

## 2021-01-24 ENCOUNTER — Other Ambulatory Visit (HOSPITAL_COMMUNITY): Payer: Self-pay | Admitting: Cardiology

## 2021-01-26 ENCOUNTER — Other Ambulatory Visit: Payer: Self-pay | Admitting: Obstetrics & Gynecology

## 2021-01-26 DIAGNOSIS — M8580 Other specified disorders of bone density and structure, unspecified site: Secondary | ICD-10-CM

## 2021-02-15 DIAGNOSIS — Z9189 Other specified personal risk factors, not elsewhere classified: Secondary | ICD-10-CM | POA: Diagnosis not present

## 2021-02-15 DIAGNOSIS — N644 Mastodynia: Secondary | ICD-10-CM | POA: Diagnosis not present

## 2021-02-15 DIAGNOSIS — R922 Inconclusive mammogram: Secondary | ICD-10-CM | POA: Diagnosis not present

## 2021-02-25 ENCOUNTER — Other Ambulatory Visit: Payer: Self-pay

## 2021-02-25 ENCOUNTER — Encounter (HOSPITAL_COMMUNITY): Payer: Self-pay | Admitting: Emergency Medicine

## 2021-02-25 ENCOUNTER — Ambulatory Visit (HOSPITAL_COMMUNITY)
Admission: EM | Admit: 2021-02-25 | Discharge: 2021-02-25 | Disposition: A | Discharge status: Home / Self Care | Payer: Medicare Other | Attending: Physician Assistant | Admitting: Physician Assistant

## 2021-02-25 DIAGNOSIS — T63481A Toxic effect of venom of other arthropod, accidental (unintentional), initial encounter: Secondary | ICD-10-CM

## 2021-02-25 DIAGNOSIS — T63441A Toxic effect of venom of bees, accidental (unintentional), initial encounter: Secondary | ICD-10-CM

## 2021-02-25 MED ORDER — PREDNISONE 20 MG PO TABS
40.0000 mg | ORAL_TABLET | Freq: Once | ORAL | Status: AC
  Administered 2021-02-25: 40 mg via ORAL

## 2021-02-25 MED ORDER — PREDNISONE 20 MG PO TABS
ORAL_TABLET | ORAL | Status: AC
  Filled 2021-02-25: qty 2

## 2021-02-25 MED ORDER — PREDNISONE 20 MG PO TABS: 40.0000 mg | ORAL_TABLET | Freq: Every day | ORAL | 0 refills | Status: AC

## 2021-02-25 NOTE — Discharge Instructions (Addendum)
This is a likely allergic reaction to you may continue to have itching and stinging sensation.  Take famotidine (Pepcid) 40 mg each night.  Take Claritin (loratadine) starting tomorrow morning each morning.  Take prednisone 40 mg every day for 4 days starting tomorrow since you are given a dose today.  While taking this no ibuprofen, aspirin, Aleve.  Use cool washcloths and ice to help manage symptoms.  You can use hydrocortisone cream for itching sensation.  If you have any shortness of breath, trouble speaking, trouble swallowing you need to go to the emergency room.  If you have any worsening symptoms you need to go to the emergency room.

## 2021-02-25 NOTE — ED Provider Notes (Addendum)
[place]    CSN: [id number] Arrival date & time: 02/25/21  1005      History   Chief Complaint Chief Complaint  Patient presents with   Insect Bite    HPI Colleen White is a 75 y.o. female.   Patient presents today after being stung by multiple flying insects earlier today.  Reports she was out working in the garden she had long shorts on when she went to move mulch from away from a bush and several insects flew up at her and stung her on her lower legs and abdomen.  She did initially apply cool rags and took 75 mg of Benadryl which has provided some relief of symptoms.  She denies any history of anaphylaxis or known bee sting allergy.  She denies any associated shortness of breath, trouble swallowing, voice changes.  She reports discomfort which is rated 7 on a 0-10 pain scale, described as burning/itching sensation at the point of stings, no aggravating relieving factors identified.   Past Medical History:  Diagnosis Date   Allergic rhinitis, cause unspecified    Alopecia    Anxiety    Colon polyps    Diverticulosis of colon (without mention of hemorrhage)    Fibrocystic breast disease    GERD (gastroesophageal reflux disease)    Glaucoma    Headache(784.0)    Hemorrhoids    History of echocardiogram    Echo 3/17:  Vigorous LVF, EF 65-70%, normal wall motion, grade 1 diastolic, mildly elevated LVOT velocity of 2.2 m/s-likely related to vigorous LVF   History of stress test    Myoview normal in 2/05. //  Stress echo (11/15) with no evidence for ischemia or infarction.   HLD (hyperlipidemia)    HTN (hypertension)    Hypertension    Mitral valve disorders(424.0)    Non Hodgkin's lymphoma (HCC)    Treated at [facility] with surgery and XRT.  Did not have chemotherapy.    OSA (obstructive sleep apnea) 03/29/2016   Osteopenia    PAF (paroxysmal atrial fibrillation) ([place])    a. CHADS2-VASc=3 //  b. Xarelto started in 2017   Pulmonary nodule    Presumed benign after  observation.    Sleep apnea    CPAP   Unspecified glaucoma(365.9)    Varicose vein of leg     Patient Active Problem List   Diagnosis Date Noted   LRTI (lower respiratory tract infection) 08/01/2020   Diuretic-induced hypokalemia 11/04/2019   NASH (nonalcoholic steatohepatitis) 09/10/2018   B12 deficiency due to diet 02/18/2018   Primary osteoarthritis involving multiple joints 06/30/2017   Neurodermatitis 04/23/2017   Current moderate episode of major depressive disorder without prior episode ([place]) 04/07/2017   Vitamin D deficiency 12/24/2016   OSA (obstructive sleep apnea) 03/29/2016   Paroxysmal atrial fibrillation ([place]) 11/08/2015   Varicose veins of right lower extremity with pain 04/13/2015   Routine general medical examination at a health care facility 11/30/2013   Cough 11/30/2013   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 [facility]) 10/20/2010   GLAUCOMA 08/04/2008   ALLERGIC RHINITIS 12/17/2007   Osteoporosis 12/17/2007   HYPERCHOLESTEROLEMIA 07/31/2007   Essential hypertension 07/31/2007   MITRAL VALVE PROLAPSE 07/31/2007   GERD 07/31/2007    Past Surgical History:  Procedure Laterality Date   ABDOMINAL HYSTERECTOMY  1996   BASAL CELL CARCINOMA EXCISION     06/2017   BREAST CYST ASPIRATION  2017   dental implant  12/21/2019   #31   EYE SURGERY  2009   Cataract   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 Cloyd Stagers, [facility]   s/p ORIF prox phalanx  02/2007   right little finger----Dr Fredna Dow    OB History   No obstetric history on file.      Home Medications    Prior to Admission medications   Medication Sig Start Date End Date Taking? Authorizing Provider  predniSONE (DELTASONE) 20 MG tablet Take 2 tablets (40 mg total) by mouth daily for 4 days. 02/25/21 03/01/21 Yes Payton Moder, Derry Skill, PA-C  Ascorbic Acid (VITAMIN C PO) Take 500 mg by mouth daily.     [provider]  brimonidine-timolol (COMBIGAN) 0.2-0.5 % ophthalmic solution  Place 1 drop into both eyes every 12 (twelve) hours.     [provider]  calcium citrate (CALCITRATE - DOSED IN MG ELEMENTAL CALCIUM) 950 (200 Ca) MG tablet Take 200 mg of elemental calcium by mouth 2 (two) times daily.    [provider]  Cholecalciferol (VITAMIN D3) 1000 units CAPS Take 1,000 Units by mouth.     [provider]  Coenzyme Q10 (CO Q 10) 100 MG CAPS Take 1 tablet by mouth daily.     [provider]  dicyclomine (BENTYL) 10 MG capsule Take 10 mg by mouth as needed for spasms.    [provider]  diltiazem (CARDIZEM CD) 120 MG 24 hr capsule TAKE (1) CAPSULE DAILY. 01/24/21   Larey Dresser, MD  docusate sodium (COLACE) 100 MG capsule Take 200 mg by mouth as needed.    [provider]  esomeprazole (NEXIUM) 20 MG packet Take 20 mg by mouth at bedtime. Takes as needed.    [provider]  gabapentin (NEURONTIN) 100 MG capsule Take 3 capsules (300 mg total) by mouth at bedtime. 09/05/20   Janith Lima, MD  loratadine (CLARITIN) 10 MG tablet Take by mouth daily as needed.     [provider]  METAMUCIL FIBER PO Take by mouth. 1 tsp by mouth once a day    [provider]  MINOXIDIL FOR MEN EX Apply topically. Takes as needed at night.    [provider]  mirtazapine (REMERON) 15 MG tablet Take 7.5 mg by mouth as needed. Takes 1/4 tablet at bedtime for itching of scalp    [provider]  Multiple Vitamins-Minerals (CENTRUM SILVER PO) Take 1 tablet by mouth daily.    [provider]  Multiple Vitamins-Minerals (PRESERVISION AREDS 2 PO) Take 1 tablet by mouth 2 (two) times daily.     [provider]  potassium chloride (MICRO-K) 10 MEQ CR capsule Take 1 capsule (10 mEq total) by mouth with breakfast, with lunch, and with evening meal. 09/08/20   Janith Lima, MD  pravastatin (PRAVACHOL) 40 MG tablet TAKE 1 TABLET ONCE DAILY. Patient taking differently: Take 40 mg by mouth  at bedtime. 11/17/20   Janith Lima, MD  Probiotic Product (PROBIOTIC DAILY PO) Take 1 tablet by mouth daily.    [provider]  telmisartan-hydrochlorothiazide (MICARDIS HCT) 80-12.5 MG tablet TAKE 1 TABLET ONCE DAILY. 10/13/20   Janith Lima, MD  traMADol (ULTRAM) 50 MG tablet TAKE ONE TABLET EVERY 6 HOURS AS NEEDED. 11/23/20   Janith Lima, MD  tretinoin (RETIN-A) 0.05 % cream Once daily    [provider]  XARELTO 20 MG TABS tablet TAKE 1 TABLET DAILY WITH SUPPER. 10/13/20   Larey Dresser, MD    Family  History Family History  Problem Relation Age of Onset   Hypertension Mother    Breast cancer Mother        double mastectomy   Heart disease Mother    Diabetes Father    Heart disease Father    Prostate cancer Father    Esophageal cancer Father    Breast cancer Sister        breast (sister), spread to brain   Colon cancer Maternal Uncle     Social History Social History   Tobacco Use   Smoking status: Former    Pack years: 0.00    Types: Cigarettes    Quit date: 08/27/1974    Years since quitting: 46.5   Smokeless tobacco: Never  Vaping Use   Vaping Use: Never used  Substance Use Topics   Alcohol use: No   Drug use: No     Allergies   Patient has no known allergies.   Review of Systems Review of Systems  Constitutional:  Positive for activity change. Negative for appetite change, fatigue and fever.  Respiratory:  Negative for cough and shortness of breath.   Cardiovascular:  Negative for chest pain.  Gastrointestinal:  Negative for abdominal pain, diarrhea, nausea and vomiting.  Musculoskeletal:  Negative for arthralgias and myalgias.  Skin:  Positive for color change and wound.  Neurological:  Negative for dizziness, weakness, light-headedness, numbness and headaches.    Physical Exam Triage Vital Signs ED Triage Vitals  Enc Vitals Group     BP 02/25/21 1024 130/80     Pulse Rate 02/25/21 1024 97     Resp 02/25/21 1024 18      Temp 02/25/21 1024 98.4 F (36.9 C)     Temp src --      SpO2 02/25/21 1024 100 %     Weight --      Height --      Head Circumference --      [place] --      Pain Score 02/25/21 1023 7     Pain Loc --      Pain Edu? --      Excl. in [place]? --    No data found.  Updated Vital Signs BP 130/80 (BP Location: Left Arm)   Pulse 97   Temp 98.4 F (36.9 C)   Resp 18   SpO2 100%   Visual Acuity Right Eye Distance:   Left Eye Distance:   Bilateral Distance:    Right Eye Near:   Left Eye Near:    Bilateral Near:     Physical Exam Vitals reviewed.  Constitutional:      General: She is awake. She is not in acute distress.    Appearance: Normal appearance. She is normal weight. She is not ill-appearing.     Comments: Very pleasant female appears stated age in no acute distress  HENT:     Head: Normocephalic and atraumatic.     Mouth/Throat:     Pharynx: Uvula midline. No oropharyngeal exudate or posterior oropharyngeal erythema.  Cardiovascular:     Rate and Rhythm: Normal rate and regular rhythm.     Heart sounds: Normal heart sounds, S1 normal and S2 normal. No murmur heard. Pulmonary:     Effort: Pulmonary effort is normal.     Breath sounds: Normal breath sounds. No wheezing, rhonchi or rales.     Comments: Clear to auscultation bilaterally Abdominal:     General: Bowel sounds are normal.  Palpations: Abdomen is soft.     Tenderness: There is no abdominal tenderness. There is no right CVA tenderness, left CVA tenderness, guarding or rebound.  Skin:    Findings: Erythema and rash present. Rash is urticarial. Rash is not macular or papular.     Comments: Multiple urticarial appearing lesions noted lower legs and trunk/abdomen.  No streaking or evidence of lymphangitis.  No bleeding or drainage noted.  Psychiatric:        Behavior: Behavior is cooperative.     UC Treatments / Results  Labs (all labs ordered are listed, but only abnormal results are  displayed) Labs Reviewed - No data to display  EKG   Radiology No results found.  Procedures Procedures (including critical care time)  Medications Ordered in UC Medications  predniSONE (DELTASONE) tablet 40 mg (40 mg Oral Given 02/25/21 1059)    Initial Impression / Assessment and Plan / UC Course  I have reviewed the triage vital signs and the nursing notes.  Pertinent labs & imaging results that were available during my care of the patient were reviewed by me and considered in my medical decision making (see chart for details).      Discussed that we do not use her antibiotics at this time given this is likely an allergic reaction as it happened only a few hours ago.  Recommended she alternate H1 and H2 blockers and she was started on prednisone.  She denies any history of diabetes.  She was instructed not to take NSAIDs with prednisone due to risk of GI bleeding.  Recommended she use cool compresses as well as hydrocortisone cream to manage symptoms.  Discussed at length alarm symptoms that warrant emergent evaluation.  Strict return precautions given to which patient expressed understanding.  Addendum: After going into discharge patient she reported some shortness of breath.  Repeat vital signs showed pulse ox of 100%, pulse of 74 bpm, blood pressure 162/71.  Patient was crying and actively distraught.  After several minutes of deep breathing exercises symptoms improved.  She was given first dose of prednisone in clinic (40 mg) without side effect.  She was monitored for approximately 20 to 30 minutes and had no recurrent episodes.  Patient reports significant improvement in symptoms with medication and she will go home and monitor her symptoms.  Final Clinical Impressions(s) / UC Diagnoses   Final diagnoses:  Insect stings, accidental or unintentional, initial encounter  Bee sting reaction, accidental or unintentional, initial encounter     Discharge Instructions      This  is a likely allergic reaction to you may continue to have itching and stinging sensation.  Take famotidine (Pepcid) 40 mg each night.  Take Claritin (loratadine) starting tomorrow morning each morning.  Take prednisone 40 mg every day for 4 days starting tomorrow since you are given a dose today.  While taking this no ibuprofen, aspirin, Aleve.  Use cool washcloths and ice to help manage symptoms.  You can use hydrocortisone cream for itching sensation.  If you have any shortness of breath, trouble speaking, trouble swallowing you need to go to the emergency room.  If you have any worsening symptoms you need to go to the emergency room.     ED Prescriptions     Medication Sig Dispense Auth. Provider   predniSONE (DELTASONE) 20 MG tablet Take 2 tablets (40 mg total) by mouth daily for 4 days. 8 tablet Jerric Oyen K, PA-C      PDMP not reviewed this  encounter.   Terrilee Croak, PA-C 02/25/21 1043    Adynn Caseres, Derry Skill, PA-C 02/25/21 1140

## 2021-02-25 NOTE — ED Triage Notes (Signed)
Pt is present today with multiple bee stings.Pt states that she was stung on her legs, back, abdomen, and buttocks. Pt states that she was stung this morning.

## 2021-03-06 ENCOUNTER — Other Ambulatory Visit: Payer: Self-pay

## 2021-03-06 ENCOUNTER — Ambulatory Visit (INDEPENDENT_AMBULATORY_CARE_PROVIDER_SITE_OTHER): Payer: Medicare Other | Admitting: Internal Medicine

## 2021-03-06 ENCOUNTER — Encounter: Payer: Self-pay | Admitting: Internal Medicine

## 2021-03-06 VITALS — BP 138/86 | HR 76 | Temp 98.1°F | Resp 16 | Ht 63.0 in | Wt 152.0 lb

## 2021-03-06 DIAGNOSIS — I83811 Varicose veins of right lower extremities with pain: Secondary | ICD-10-CM | POA: Diagnosis not present

## 2021-03-06 DIAGNOSIS — E559 Vitamin D deficiency, unspecified: Secondary | ICD-10-CM | POA: Diagnosis not present

## 2021-03-06 DIAGNOSIS — E538 Deficiency of other specified B group vitamins: Secondary | ICD-10-CM | POA: Diagnosis not present

## 2021-03-06 DIAGNOSIS — I10 Essential (primary) hypertension: Secondary | ICD-10-CM | POA: Diagnosis not present

## 2021-03-06 DIAGNOSIS — D539 Nutritional anemia, unspecified: Secondary | ICD-10-CM | POA: Diagnosis not present

## 2021-03-06 DIAGNOSIS — K7581 Nonalcoholic steatohepatitis (NASH): Secondary | ICD-10-CM

## 2021-03-06 DIAGNOSIS — T502X5A Adverse effect of carbonic-anhydrase inhibitors, benzothiadiazides and other diuretics, initial encounter: Secondary | ICD-10-CM | POA: Diagnosis not present

## 2021-03-06 DIAGNOSIS — E876 Hypokalemia: Secondary | ICD-10-CM | POA: Diagnosis not present

## 2021-03-06 LAB — MAGNESIUM: Magnesium: 2.1 mg/dL (ref 1.5–2.5)

## 2021-03-06 LAB — HEPATIC FUNCTION PANEL
ALT: 20 U/L (ref 0–35)
AST: 19 U/L (ref 0–37)
Albumin: 4.4 g/dL (ref 3.5–5.2)
Alkaline Phosphatase: 88 U/L (ref 39–117)
Bilirubin, Direct: 0.1 mg/dL (ref 0.0–0.3)
Total Bilirubin: 0.7 mg/dL (ref 0.2–1.2)
Total Protein: 7 g/dL (ref 6.0–8.3)

## 2021-03-06 LAB — BASIC METABOLIC PANEL
BUN: 10 mg/dL (ref 6–23)
CO2: 34 mEq/L — ABNORMAL HIGH (ref 19–32)
Calcium: 9.6 mg/dL (ref 8.4–10.5)
Chloride: 102 mEq/L (ref 96–112)
Creatinine, Ser: 0.8 mg/dL (ref 0.40–1.20)
GFR: 71.6 mL/min (ref >60.00)
Glucose, Bld: 95 mg/dL (ref 70–99)
Potassium: 3.9 mEq/L (ref 3.5–5.1)
Sodium: 142 mEq/L (ref 135–145)

## 2021-03-06 LAB — PROTIME-INR
INR: 1.7 ratio — ABNORMAL HIGH (ref 0.8–1.0)
Prothrombin Time: 18.4 s — ABNORMAL HIGH (ref 9.6–13.1)

## 2021-03-06 LAB — VITAMIN D 25 HYDROXY (VIT D DEFICIENCY, FRACTURES): VITD: 49.79 ng/mL (ref 30.00–100.00)

## 2021-03-06 LAB — TSH: TSH: 1.69 u[IU]/mL (ref 0.35–5.50)

## 2021-03-06 LAB — VITAMIN B12: Vitamin B-12: 593 pg/mL (ref 211–911)

## 2021-03-06 LAB — FOLATE: Folate: >24.4 ng/mL (ref >5.9)

## 2021-03-06 NOTE — Patient Instructions (Signed)

## 2021-03-06 NOTE — Progress Notes (Signed)
Subjective:  Patient ID: Colleen White, female    DOB: 11/05/1945  Age: 75 y.o. MRN: [id number]  CC: Anemia and Hypertension  This visit occurred during the SARS-CoV-2 public health emergency.  Safety protocols were in place, including screening questions prior to the visit, additional usage of staff PPE, and extensive cleaning of exam room while observing appropriate contact time as indicated for disinfecting solutions.    HPI Colleen White presents for f/up -  She has painful varicose veins on her legs and would like to see if something can be done about this.  Outpatient Medications Prior to Visit  Medication Sig Dispense Refill   Ascorbic Acid (VITAMIN C PO) Take 500 mg by mouth daily.      brimonidine-timolol (COMBIGAN) 0.2-0.5 % ophthalmic solution Place 1 drop into both eyes every 12 (twelve) hours.      calcium citrate (CALCITRATE - DOSED IN MG ELEMENTAL CALCIUM) 950 (200 Ca) MG tablet Take 200 mg of elemental calcium by mouth 2 (two) times daily.     Cholecalciferol (VITAMIN D3) 1000 units CAPS Take 1,000 Units by mouth.      Coenzyme Q10 (CO Q 10) 100 MG CAPS Take 1 tablet by mouth daily.      dicyclomine (BENTYL) 10 MG capsule Take 10 mg by mouth as needed for spasms.     diltiazem (CARDIZEM CD) 120 MG 24 hr capsule TAKE (1) CAPSULE DAILY. 90 capsule 0   docusate sodium (COLACE) 100 MG capsule Take 200 mg by mouth as needed.     esomeprazole (NEXIUM) 20 MG packet Take 20 mg by mouth at bedtime. Takes as needed.     gabapentin (NEURONTIN) 100 MG capsule Take 3 capsules (300 mg total) by mouth at bedtime. 270 capsule 1   loratadine (CLARITIN) 10 MG tablet Take by mouth daily as needed.      MINOXIDIL FOR MEN EX Apply topically. Takes as needed at night.     mirtazapine (REMERON) 15 MG tablet Take 7.5 mg by mouth as needed. Takes 1/4 tablet at bedtime for itching of scalp     Multiple Vitamins-Minerals (PRESERVISION AREDS 2 PO) Take 1 tablet by mouth 2 (two) times daily.       potassium chloride (MICRO-K) 10 MEQ CR capsule Take 1 capsule (10 mEq total) by mouth with breakfast, with lunch, and with evening meal. 270 capsule 1   pravastatin (PRAVACHOL) 40 MG tablet TAKE 1 TABLET ONCE DAILY. (Patient taking differently: Take 40 mg by mouth at bedtime.) 90 tablet 1   Probiotic Product (PROBIOTIC DAILY PO) Take 1 tablet by mouth daily.     telmisartan-hydrochlorothiazide (MICARDIS HCT) 80-12.5 MG tablet TAKE 1 TABLET ONCE DAILY. 90 tablet 1   traMADol (ULTRAM) 50 MG tablet TAKE ONE TABLET EVERY 6 HOURS AS NEEDED. 65 tablet 3   tretinoin (RETIN-A) 0.05 % cream Once daily     XARELTO 20 MG TABS tablet TAKE 1 TABLET DAILY WITH SUPPER. 30 tablet 6   METAMUCIL FIBER PO Take by mouth. 1 tsp by mouth once a day (Patient not taking: Reported on 03/06/2021)     Multiple Vitamins-Minerals (CENTRUM SILVER PO) Take 1 tablet by mouth daily. (Patient not taking: Reported on 03/06/2021)     No facility-administered medications prior to visit.    ROS Review of Systems  Constitutional:  Negative for diaphoresis and fatigue.  HENT: Negative.    Eyes: Negative.   Respiratory:  Negative for cough, chest tightness, shortness of breath and wheezing.  Cardiovascular:  Negative for chest pain, palpitations and leg swelling.  Gastrointestinal:  Negative for abdominal pain, constipation, diarrhea, nausea and vomiting.  Endocrine: Negative.   Genitourinary: Negative.  Negative for difficulty urinating.  Musculoskeletal: Negative.  Negative for myalgias.  Skin: Negative.   Neurological:  Negative for dizziness, weakness and light-headedness.  Hematological:  Negative for adenopathy. Does not bruise/bleed easily.  Psychiatric/Behavioral: Negative.     Objective:  BP 138/86 (BP Location: Left Arm, Patient Position: Sitting, Cuff Size: Normal)   Pulse 68   Temp 98.5 F (36.9 C) (Oral)   Wt 152 lb (68.9 kg)   SpO2 98%   BMI 26.93 kg/m   BP Readings from Last 3 Encounters:  03/06/21  138/86  02/25/21 130/80  11/08/20 120/78    Wt Readings from Last 3 Encounters:  03/06/21 152 lb (68.9 kg)  01/05/21 155 lb 12.8 oz (70.7 kg)  11/08/20 153 lb 9.6 oz (69.7 kg)    Physical Exam Vitals reviewed.  HENT:     Nose: Nose normal.     Mouth/Throat:     Mouth: Mucous membranes are moist.  Eyes:     General: No scleral icterus.    Conjunctiva/sclera: Conjunctivae normal.  Cardiovascular:     Rate and Rhythm: Normal rate and regular rhythm.     Heart sounds: No murmur heard. Pulmonary:     Effort: Pulmonary effort is normal.     Breath sounds: No stridor. No wheezing, rhonchi or rales.  Abdominal:     General: Abdomen is protuberant. Bowel sounds are normal. There is no distension.     Palpations: Abdomen is soft. There is no hepatomegaly, splenomegaly or mass.     Tenderness: There is no abdominal tenderness.  Musculoskeletal:        General: Normal range of motion.     Cervical back: Neck supple.     Right lower leg: No edema.     Left lower leg: No edema.  Lymphadenopathy:     Cervical: No cervical adenopathy.  Skin:    General: Skin is warm and dry.  Neurological:     General: No focal deficit present.     Mental Status: She is alert.  Psychiatric:        Mood and Affect: Mood normal.        Behavior: Behavior normal.    Lab Results  Component Value Date   WBC 4.1 01/05/2021   HGB 13.4 01/05/2021   HCT 39.5 01/05/2021   PLT 191 01/05/2021   GLUCOSE 95 03/06/2021   CHOL 144 07/19/2020   TRIG 56 07/19/2020   HDL 46 07/19/2020   LDLDIRECT 142.5 09/30/2012   LDLCALC 87 07/19/2020   ALT 20 03/06/2021   AST 19 03/06/2021   NA 142 03/06/2021   K 3.9 03/06/2021   CL 102 03/06/2021   CREATININE 0.80 03/06/2021   BUN 10 03/06/2021   CO2 34 (H) 03/06/2021   TSH 1.69 03/06/2021   INR 1.7 (H) 03/06/2021   HGBA1C 5.8 09/10/2018    No results found.  Assessment & Plan:   Colleen White was seen today for anemia and hypertension.  Diagnoses and all  orders for this visit:  Diuretic-induced hypokalemia- Her K+ level is normal now. -     Magnesium; Future -     Basic metabolic panel; Future -     Basic metabolic panel -     Magnesium  Essential hypertension- Her BP is well controlled. -     Magnesium;  Future -     Basic metabolic panel; Future -     TSH; Future -     TSH -     Basic metabolic panel -     Magnesium  Deficiency anemia- I will monitor her B1 level. -     Vitamin B1; Future -     Vitamin B1  Varicose veins of right lower extremity with pain -     Folate; Future -     Vitamin B12; Future -     Zinc; Future -     Zinc -     Vitamin B12 -     Folate  NASH (nonalcoholic steatohepatitis)- LFT's are normal now. -     Hepatic function panel; Future -     Protime-INR; Future -     Protime-INR -     Hepatic function panel  B12 deficiency due to diet- Will monitor her B12/folate levels. -     Folate; Future -     Vitamin B12; Future -     Vitamin B12 -     Folate  Vitamin D deficiency- Will monitor her Vit D level. -     VITAMIN D 25 Hydroxy (Vit-D Deficiency, Fractures); Future -     VITAMIN D 25 Hydroxy (Vit-D Deficiency, Fractures)  I am having [name] maintain her tretinoin, Multiple Vitamins-Minerals (PRESERVISION AREDS 2 PO), Probiotic Product (PROBIOTIC DAILY PO), brimonidine-timolol, loratadine, Vitamin D3, mirtazapine, docusate sodium, esomeprazole, Co Q 10, Ascorbic Acid (VITAMIN C PO), calcium citrate, MINOXIDIL FOR MEN EX, dicyclomine, gabapentin, potassium chloride, Multiple Vitamins-Minerals (CENTRUM SILVER PO), METAMUCIL FIBER PO, Xarelto, telmisartan-hydrochlorothiazide, pravastatin, traMADol, and diltiazem.  No orders of the defined types were placed in this encounter.    Follow-up: Return in about 6 months (around 09/06/2021).  Scarlette Calico, MD

## 2021-03-07 ENCOUNTER — Encounter: Payer: Self-pay | Admitting: Internal Medicine

## 2021-03-10 ENCOUNTER — Encounter: Payer: Self-pay | Admitting: Internal Medicine

## 2021-03-10 LAB — ZINC: Zinc: 141 ug/dL — ABNORMAL HIGH (ref 60–130)

## 2021-03-10 LAB — VITAMIN B1: Vitamin B1 (Thiamine): 23 nmol/L (ref 8–30)

## 2021-03-21 ENCOUNTER — Other Ambulatory Visit: Payer: Self-pay | Admitting: Internal Medicine

## 2021-03-21 DIAGNOSIS — M8949 Other hypertrophic osteoarthropathy, multiple sites: Secondary | ICD-10-CM

## 2021-03-21 DIAGNOSIS — M159 Polyosteoarthritis, unspecified: Secondary | ICD-10-CM

## 2021-04-10 ENCOUNTER — Other Ambulatory Visit (HOSPITAL_COMMUNITY): Payer: Self-pay | Admitting: Cardiology

## 2021-04-11 DIAGNOSIS — C8290 Follicular lymphoma, unspecified, unspecified site: Secondary | ICD-10-CM | POA: Diagnosis not present

## 2021-04-12 ENCOUNTER — Other Ambulatory Visit: Payer: Self-pay | Admitting: Internal Medicine

## 2021-04-12 DIAGNOSIS — I10 Essential (primary) hypertension: Secondary | ICD-10-CM

## 2021-04-30 ENCOUNTER — Other Ambulatory Visit: Payer: Self-pay

## 2021-04-30 ENCOUNTER — Encounter (HOSPITAL_BASED_OUTPATIENT_CLINIC_OR_DEPARTMENT_OTHER): Payer: Self-pay

## 2021-04-30 ENCOUNTER — Emergency Department (HOSPITAL_BASED_OUTPATIENT_CLINIC_OR_DEPARTMENT_OTHER)
Admission: EM | Admit: 2021-04-30 | Discharge: 2021-04-30 | Disposition: A | Discharge status: Home / Self Care | Payer: Medicare Other | Attending: Emergency Medicine | Admitting: Emergency Medicine

## 2021-04-30 DIAGNOSIS — Z86010 Personal history of colonic polyps: Secondary | ICD-10-CM | POA: Diagnosis not present

## 2021-04-30 DIAGNOSIS — Z7901 Long term (current) use of anticoagulants: Secondary | ICD-10-CM | POA: Diagnosis not present

## 2021-04-30 DIAGNOSIS — Z87891 Personal history of nicotine dependence: Secondary | ICD-10-CM | POA: Diagnosis not present

## 2021-04-30 DIAGNOSIS — I10 Essential (primary) hypertension: Secondary | ICD-10-CM | POA: Diagnosis not present

## 2021-04-30 DIAGNOSIS — H9201 Otalgia, right ear: Secondary | ICD-10-CM | POA: Diagnosis not present

## 2021-04-30 DIAGNOSIS — Z8572 Personal history of non-Hodgkin lymphomas: Secondary | ICD-10-CM | POA: Diagnosis not present

## 2021-04-30 DIAGNOSIS — Z79899 Other long term (current) drug therapy: Secondary | ICD-10-CM | POA: Insufficient documentation

## 2021-04-30 NOTE — ED Triage Notes (Signed)
Sharp R ear pain . Began last PM

## 2021-04-30 NOTE — Discharge Instructions (Addendum)
The condition I told you to think about was "trigeminal neuralgia".  Additionally it is possible that you are having a prodrome for a herpes zoster, or "shingles" infection affecting the dermatome that involves her ear.  I would keep your follow-up with [facility], follow-up with your PCP if symptoms worsen or fail to improve.  Please return if you begin to have worse pain, vision changes, loss of balance or other new weakness, confusion, etc.

## 2021-04-30 NOTE — ED Provider Notes (Signed)
[facility] Provider Note   CSN: [id number] Arrival date & time: 04/30/21  1330     History Chief Complaint  Patient presents with   Otalgia    Colleen White is a 75 y.o. female with a PMH significant for lymphoma, HTN, who presents with one day of cute onset right ear pain.  Patient reports the pain is sporadic, lasts for only about 1 second, feels like a sharp jolt of electricity inside of her eardrum.  Patient reports she is worried that she damaged her eardrum traumatically while using a Q-tip.  Patient has had no fever, no headache, no vision changes no muscle aches, no weakness.  Patient does not have pain in her jaw with chewing.  Has not noticed any discharge from the ear, reports the pain does not affect her hearing, or balance.   Otalgia     Past Medical History:  Diagnosis Date   Allergic rhinitis, cause unspecified    Alopecia    Anxiety    Colon polyps    Diverticulosis of colon (without mention of hemorrhage)    Fibrocystic breast disease    GERD (gastroesophageal reflux disease)    Glaucoma    Headache(784.0)    Hemorrhoids    History of echocardiogram    Echo 3/17:  Vigorous LVF, EF 65-70%, normal wall motion, grade 1 diastolic, mildly elevated LVOT velocity of 2.2 m/s-likely related to vigorous LVF   History of stress test    Myoview normal in 2/05. //  Stress echo (11/15) with no evidence for ischemia or infarction.   HLD (hyperlipidemia)    HTN (hypertension)    Hypertension    Mitral valve disorders(424.0)    Non Hodgkin's lymphoma (HCC)    Treated at [facility] with surgery and XRT.  Did not have chemotherapy.    OSA (obstructive sleep apnea) 03/29/2016   Osteopenia    PAF (paroxysmal atrial fibrillation) ([place])    a. CHADS2-VASc=3 //  b. Xarelto started in 2017   Pulmonary nodule    Presumed benign after observation.    Sleep apnea    CPAP   Unspecified glaucoma(365.9)    Varicose vein of leg     Patient Active  Problem List   Diagnosis Date Noted   Deficiency anemia 03/06/2021   LRTI (lower respiratory tract infection) 08/01/2020   Diuretic-induced hypokalemia 11/04/2019   NASH (nonalcoholic steatohepatitis) 09/10/2018   B12 deficiency due to diet 02/18/2018   Primary osteoarthritis involving multiple joints 06/30/2017   Neurodermatitis 04/23/2017   Current moderate episode of major depressive disorder without prior episode ([place]) 04/07/2017   Vitamin D deficiency 12/24/2016   OSA (obstructive sleep apnea) 03/29/2016   Paroxysmal atrial fibrillation ([place]) 11/08/2015   Varicose veins of right lower extremity with pain 04/13/2015   Routine general medical examination at a health care facility 11/30/2013   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 Cloyd Stagers, [facility]  s/p ORIF prox phalanx  02/2007   right little finger----Dr Fredna Dow     OB History   No obstetric history on file.     Family History  Problem Relation Age of Onset   Hypertension Mother    Breast cancer Mother        double mastectomy   Heart disease Mother    Diabetes Father    Heart disease Father    Prostate cancer Father    Esophageal cancer Father    Breast cancer Sister        breast (sister), spread to brain   Colon cancer Maternal Uncle     Social History   Tobacco Use   Smoking status: Former    Types: Cigarettes    Quit date: 08/27/1974    Years since quitting: 46.7   Smokeless tobacco:  Never  Vaping Use   Vaping Use: Never used  Substance Use Topics   Alcohol use: No   Drug use: No    Home Medications Prior to Admission medications   Medication Sig Start Date End Date Taking? Authorizing Provider  Ascorbic Acid (VITAMIN C PO) Take 500 mg by mouth daily.     [provider]  brimonidine-timolol (COMBIGAN) 0.2-0.5 % ophthalmic solution Place 1 drop into both eyes every 12 (twelve) hours.     [provider]  calcium citrate (CALCITRATE - DOSED IN MG ELEMENTAL CALCIUM) 950 (200 Ca) MG tablet Take 200 mg of elemental calcium by mouth 2 (two) times daily.    [provider]  Cholecalciferol (VITAMIN D3) 1000 units CAPS Take 1,000 Units by mouth.     [provider]  Coenzyme Q10 (CO Q 10) 100 MG CAPS Take 1 tablet by mouth daily.     [provider]  dicyclomine (BENTYL) 10 MG capsule Take 10 mg by mouth as needed for spasms.    [provider]  diltiazem (CARDIZEM CD) 120 MG 24 hr capsule TAKE ONE CAPSULE BY MOUTH DAILY 04/11/21   Larey Dresser, MD  docusate sodium (COLACE) 100 MG capsule Take 200 mg by mouth as needed.    [provider]  esomeprazole (NEXIUM) 20 MG packet Take 20 mg by mouth at bedtime. Takes as needed.    [provider]  gabapentin (NEURONTIN) 100 MG capsule Take 3 capsules (300 mg total) by mouth at bedtime. 09/05/20   Janith Lima, MD  loratadine (CLARITIN) 10 MG tablet Take by mouth daily as needed.     [provider]  METAMUCIL FIBER PO Take by mouth. 1 tsp by mouth once a day Patient not taking: Reported on 03/06/2021    [provider]  MINOXIDIL FOR MEN EX Apply topically. Takes as needed at night.    [provider]  mirtazapine (REMERON) 15 MG tablet Take 7.5 mg by mouth as needed. Takes 1/4 tablet at bedtime for itching of scalp    [provider]  Multiple Vitamins-Minerals (CENTRUM SILVER PO) Take 1 tablet by mouth daily. Patient  not taking: Reported on 03/06/2021    [provider]  Multiple Vitamins-Minerals (PRESERVISION AREDS 2 PO) Take 1 tablet by mouth 2 (two) times daily.     [provider]  potassium chloride (MICRO-K) 10 MEQ CR capsule Take 1 capsule (10 mEq total) by mouth with breakfast, with lunch, and with evening meal. 09/08/20   Janith Lima, MD  pravastatin (PRAVACHOL) 40 MG tablet TAKE 1 TABLET ONCE DAILY. Patient taking differently: Take  40 mg by mouth at bedtime. 11/17/20   Janith Lima, MD  Probiotic Product (PROBIOTIC DAILY PO) Take 1 tablet by mouth daily.    [provider]  telmisartan-hydrochlorothiazide (MICARDIS HCT) 80-12.5 MG tablet TAKE 1 TABLET ONCE DAILY. 04/12/21   Janith Lima, MD  telmisartan-hydrochlorothiazide (MICARDIS HCT) 80-12.5 MG tablet TAKE 1 TABLET ONCE DAILY. 04/12/21   Janith Lima, MD  traMADol (ULTRAM) 50 MG tablet TAKE ONE TABLET EVERY 6 HOURS AS NEEDED. 03/21/21   Janith Lima, MD  tretinoin (RETIN-A) 0.05 % cream Once daily    [provider]  XARELTO 20 MG TABS tablet TAKE 1 TABLET DAILY WITH SUPPER. 10/13/20   Larey Dresser, MD    Allergies    Ibuprofen  Review of Systems   Review of Systems  HENT:  Positive for ear pain.   All other systems reviewed and are negative.  Physical Exam Updated Vital Signs BP 118/65 (BP Location: Right Arm)   Pulse 64   Temp 98.7 F (37.1 C) (Oral)   Resp 16   Ht 5' 3"  (1.6 m)   Wt 68 kg   SpO2 99%   BMI 26.57 kg/m   Physical Exam Vitals and nursing note reviewed.  Constitutional:      General: She is not in acute distress.    Appearance: Normal appearance.  HENT:     Head: Normocephalic and atraumatic.     Right Ear: Tympanic membrane, ear canal and external ear normal. There is no impacted cerumen.     Nose: No congestion.  Eyes:     General:        Right eye: No discharge.        Left eye: No discharge.  Cardiovascular:     Rate and Rhythm: Normal rate and  regular rhythm.  Pulmonary:     Effort: Pulmonary effort is normal. No respiratory distress.  Musculoskeletal:        General: No deformity.  Skin:    General: Skin is warm and dry.  Neurological:     Mental Status: She is alert and oriented to person, place, and time.     Comments: Cranial nerves III through XII grossly intact.  Romberg negative.  Strength 5 out of 5 bilateral upper and lower extremities.  Psychiatric:        Mood and Affect: Mood normal.        Behavior: Behavior normal.    ED Results / Procedures / Treatments   Labs (all labs ordered are listed, but only abnormal results are displayed) Labs Reviewed - No data to display  EKG None  Radiology No results found.  Procedures Procedures   Medications Ordered in ED Medications - No data to display  ED Course  I have reviewed the triage vital signs and the nursing notes.  Pertinent labs & imaging results that were available during my care of the patient were reviewed by me and considered in my medical decision making (see chart for details).    MDM Rules/Calculators/A&P                         Patient does not have any signs of temporal arteritis.  Patient does not have any focal neurologic findings, including balance changes or hearing loss associated with her ear pain.  Examination of the right ear reveals no abnormality, tympanic membrane intact, cone of light visible, no discharge, no traumatic injury.  Discussed the differential  for her pain could include trigeminal neuralgia, or an early prodrome of herpes zoster, versus other [facility] related pain, as patient describes some other recent troubles with pain in her back teeth a few weeks ago.  Do not favor active sinus infection, no sinus tenderness over entire face, no fever, no purulent drainage.  I discussed potential treatments if this problem does clarify into her be zoster, or trigeminal neuralgia.  We discussed that this diagnosis cannot be made  clinically in the emergency department today, she should follow-up with her primary care, and [facility] as planned for further evaluation.  She should follow-up sooner if her symptoms worsen or fail to improve. Final Clinical Impression(s) / ED Diagnoses Final diagnoses:  Right ear pain    Rx / DC Orders ED Discharge Orders     None        Dorien Chihuahua 04/30/21 1532    Fredia Sorrow, MD 05/03/21 [phone]

## 2021-05-04 DIAGNOSIS — J3489 Other specified disorders of nose and nasal sinuses: Secondary | ICD-10-CM | POA: Diagnosis not present

## 2021-05-04 DIAGNOSIS — H9201 Otalgia, right ear: Secondary | ICD-10-CM | POA: Diagnosis not present

## 2021-05-09 DIAGNOSIS — J341 Cyst and mucocele of nose and nasal sinus: Secondary | ICD-10-CM | POA: Diagnosis not present

## 2021-05-09 DIAGNOSIS — J3489 Other specified disorders of nose and nasal sinuses: Secondary | ICD-10-CM | POA: Diagnosis not present

## 2021-05-09 DIAGNOSIS — J329 Chronic sinusitis, unspecified: Secondary | ICD-10-CM | POA: Diagnosis not present

## 2021-05-09 DIAGNOSIS — J342 Deviated nasal septum: Secondary | ICD-10-CM | POA: Diagnosis not present

## 2021-05-11 ENCOUNTER — Telehealth: Payer: Self-pay | Admitting: Neurology

## 2021-05-11 NOTE — Telephone Encounter (Signed)
I called pt.  She stated since 2 wks ago has noted ear pain, sharp stabbing pain, with constant headache R frontal and R side) been to ED, then ENT Dr. Constance Holster  had evaluation and CT sinuses, which are negative.  We saw her last for headache back in 2021.  She is in remission for lymphoma.  She takes claritin D which seems to ease for 2 days then returns.  She is trying to get in sooner then the January appt that was offiered. I relayed that would have ENT to refer her back to [place] (with notes and CT results).  We like to keep with same MD, she was ok to see another if could be seen sooner.  I told her I would relay to Dr. Jaynee Eagles has a FYI since she has seen previously.

## 2021-05-11 NOTE — Telephone Encounter (Signed)
Pt has called for a migraine f/u under advisement of ENT Dr she saw, recently received CT scan results no sinus issue.  Pt declined Dr Cathren Laine next avail(January date) she is asking for a call to discuss the urgency in her needing to be seen.  The option of the wait list was declined, please call.

## 2021-05-12 ENCOUNTER — Other Ambulatory Visit (HOSPITAL_COMMUNITY): Payer: Self-pay | Admitting: Cardiology

## 2021-05-15 NOTE — Telephone Encounter (Signed)
I called patient.  She stated she is actually better.  The pain she has comes and goes,  message sent to Dr. Jaynee Eagles she stated it was sounds like it was similar to what we had seen her back in 2021.  I offered Wednesday9-21-22 230 with Megan.  But the patient stated she was going to be out of town until Friday.  I do not have anything to offer her next week.  I stated I would be glad to let her know  if they will availability came open if I saw that but I also recommended for her to call and speak to the phone staff as well for any type of cancellations with Dr. Merton Border, NP.  she verbalized understanding and appreciation.

## 2021-05-15 NOTE — Telephone Encounter (Signed)
I held appt with MM/NP 05-17-21 at 1430 if pt is ok with this.

## 2021-05-19 ENCOUNTER — Other Ambulatory Visit: Payer: Self-pay | Admitting: Internal Medicine

## 2021-05-19 DIAGNOSIS — E7800 Pure hypercholesterolemia, unspecified: Secondary | ICD-10-CM

## 2021-05-20 DIAGNOSIS — M25511 Pain in right shoulder: Secondary | ICD-10-CM | POA: Diagnosis not present

## 2021-05-20 DIAGNOSIS — M25571 Pain in right ankle and joints of right foot: Secondary | ICD-10-CM | POA: Diagnosis not present

## 2021-05-20 DIAGNOSIS — M25561 Pain in right knee: Secondary | ICD-10-CM | POA: Diagnosis not present

## 2021-05-20 DIAGNOSIS — M25521 Pain in right elbow: Secondary | ICD-10-CM | POA: Diagnosis not present

## 2021-05-20 DIAGNOSIS — W108XXA Fall (on) (from) other stairs and steps, initial encounter: Secondary | ICD-10-CM | POA: Diagnosis not present

## 2021-05-20 DIAGNOSIS — M25531 Pain in right wrist: Secondary | ICD-10-CM | POA: Diagnosis not present

## 2021-05-20 DIAGNOSIS — M25551 Pain in right hip: Secondary | ICD-10-CM | POA: Diagnosis not present

## 2021-05-22 ENCOUNTER — Emergency Department (HOSPITAL_COMMUNITY)
Admission: EM | Admit: 2021-05-22 | Discharge: 2021-05-22 | Disposition: A | Discharge status: Home / Self Care | Payer: Medicare Other | Attending: Emergency Medicine | Admitting: Emergency Medicine

## 2021-05-22 ENCOUNTER — Emergency Department (HOSPITAL_COMMUNITY): Payer: Medicare Other

## 2021-05-22 ENCOUNTER — Other Ambulatory Visit: Payer: Self-pay

## 2021-05-22 DIAGNOSIS — Z7901 Long term (current) use of anticoagulants: Secondary | ICD-10-CM | POA: Insufficient documentation

## 2021-05-22 DIAGNOSIS — M25561 Pain in right knee: Secondary | ICD-10-CM | POA: Diagnosis not present

## 2021-05-22 DIAGNOSIS — R55 Syncope and collapse: Secondary | ICD-10-CM | POA: Insufficient documentation

## 2021-05-22 DIAGNOSIS — I10 Essential (primary) hypertension: Secondary | ICD-10-CM | POA: Diagnosis not present

## 2021-05-22 DIAGNOSIS — Z79899 Other long term (current) drug therapy: Secondary | ICD-10-CM | POA: Insufficient documentation

## 2021-05-22 DIAGNOSIS — M852 Hyperostosis of skull: Secondary | ICD-10-CM | POA: Diagnosis not present

## 2021-05-22 DIAGNOSIS — M25511 Pain in right shoulder: Secondary | ICD-10-CM | POA: Insufficient documentation

## 2021-05-22 DIAGNOSIS — W01198A Fall on same level from slipping, tripping and stumbling with subsequent striking against other object, initial encounter: Secondary | ICD-10-CM | POA: Diagnosis not present

## 2021-05-22 DIAGNOSIS — Z87891 Personal history of nicotine dependence: Secondary | ICD-10-CM | POA: Diagnosis not present

## 2021-05-22 DIAGNOSIS — M25571 Pain in right ankle and joints of right foot: Secondary | ICD-10-CM | POA: Insufficient documentation

## 2021-05-22 DIAGNOSIS — R519 Headache, unspecified: Secondary | ICD-10-CM | POA: Diagnosis present

## 2021-05-22 DIAGNOSIS — J3489 Other specified disorders of nose and nasal sinuses: Secondary | ICD-10-CM | POA: Diagnosis not present

## 2021-05-22 DIAGNOSIS — S0990XA Unspecified injury of head, initial encounter: Secondary | ICD-10-CM | POA: Diagnosis not present

## 2021-05-22 DIAGNOSIS — I4891 Unspecified atrial fibrillation: Secondary | ICD-10-CM | POA: Diagnosis not present

## 2021-05-22 LAB — BASIC METABOLIC PANEL
Anion gap: 8 (ref 5–15)
BUN: 14 mg/dL (ref 8–23)
CO2: 30 mmol/L (ref 22–32)
Calcium: 9.3 mg/dL (ref 8.9–10.3)
Chloride: 100 mmol/L (ref 98–111)
Creatinine, Ser: 0.79 mg/dL (ref 0.44–1.00)
GFR, Estimated: >60 mL/min (ref >60)
Glucose, Bld: 96 mg/dL (ref 70–99)
Potassium: 4.1 mmol/L (ref 3.5–5.1)
Sodium: 138 mmol/L (ref 135–145)

## 2021-05-22 LAB — CBC
HCT: 38.7 % (ref 36.0–46.0)
Hemoglobin: 12.9 g/dL (ref 12.0–15.0)
MCH: 31.7 pg (ref 26.0–34.0)
MCHC: 33.3 g/dL (ref 30.0–36.0)
MCV: 95.1 fL (ref 80.0–100.0)
Platelets: 204 10*3/uL (ref 150–400)
RBC: 4.07 MIL/uL (ref 3.87–5.11)
RDW: 12.2 % (ref 11.5–15.5)
WBC: 6.7 10*3/uL (ref 4.0–10.5)
nRBC: 0 % (ref 0.0–0.2)

## 2021-05-22 NOTE — ED Triage Notes (Signed)
Pt reports falling three times on Saturday, one mechanical, and one caused by dizziness. Second time hit her head on the kitchen floor. After second fall got up to use the bathroom, felt dizzy and fell a third time. Unknown LOC on second and third fall. Pt on Xarelto. Having headache x 2 months.

## 2021-05-22 NOTE — ED Notes (Signed)
Patient discharge instructions reviewed with the patient. The patient verbalized understanding of instructions. Patient discharged.

## 2021-05-22 NOTE — ED Provider Notes (Signed)
EKG [place] Provider Note   CSN: [id number] Arrival date & time: 05/22/21  1127     History Chief Complaint  Patient presents with   Colleen White    Daphine Loch is a 75 y.o. female.  HPI Patient is a 75 year old female with past medical history significant for PAF on Xarelto follows with cardiology for this states that she has not had persistent A. fib for multiple years  She presents to the ER today with complaints of mild headache.  She states she has actually had some intermittent headaches over the past several months and has an appointment to see her neurologist to evaluate these.  Did not seem to be positional and worse in the mornings she is not having nausea or vomiting.  She states that they are frontal achy intermittent seems to be worse when she does not eat or drink very much during the day.  She is here in the ER today because she fell x3 Saturday.  She states that her first fall was earlier in the day she states that she tripped over the curb outside of an urgent care fell onto her right side and somewhat hurt her right shoulder, right arm, right knee, right ankle.  She states that she went into the urgent care got x-rays of all these areas was told she had no fractures and went home.  She states that she felt dehydrated and somewhat fatigued after all that it happened that day she states that she stood up to walk across the kitchen and felt lightheaded and fell to the ground she states she did not think that she lost consciousness however she states that her husband told her that she did.  She did not have any bowel or bladder incontinence.  She was not postictal or confused after the fall.  She did state that she bit her tongue during the fall but does recall this part of the incident.  She states she feels somewhat achy on her right side from her falls on Saturday.  She states that yesterday she felt well get plenty to drink plenty of  water did not have any more episodes of syncope or lightheadedness.  Came to the ER today because of head injury on Xarelto.    Past Medical History:  Diagnosis Date   Allergic rhinitis, cause unspecified    Alopecia    Anxiety    Colon polyps    Diverticulosis of colon (without mention of hemorrhage)    Fibrocystic breast disease    GERD (gastroesophageal reflux disease)    Glaucoma    Headache(784.0)    Hemorrhoids    History of echocardiogram    Echo 3/17:  Vigorous LVF, EF 65-70%, normal wall motion, grade 1 diastolic, mildly elevated LVOT velocity of 2.2 m/s-likely related to vigorous LVF   History of stress test    Myoview normal in 2/05. //  Stress echo (11/15) with no evidence for ischemia or infarction.   HLD (hyperlipidemia)    HTN (hypertension)    Hypertension    Mitral valve disorders(424.0)    Non Hodgkin's lymphoma (HCC)    Treated at [facility] with surgery and XRT.  Did not have chemotherapy.    OSA (obstructive sleep apnea) 03/29/2016   Osteopenia    PAF (paroxysmal atrial fibrillation) ([place])    a. CHADS2-VASc=3 //  b. Xarelto started in 2017   Pulmonary nodule    Presumed benign after observation.    Sleep apnea  CPAP   Unspecified glaucoma(365.9)    Varicose vein of leg     Patient Active Problem List   Diagnosis Date Noted   Deficiency anemia 03/06/2021   LRTI (lower respiratory tract infection) 08/01/2020   Diuretic-induced hypokalemia 11/04/2019   NASH (nonalcoholic steatohepatitis) 09/10/2018   B12 deficiency due to diet 02/18/2018   Primary osteoarthritis involving multiple joints 06/30/2017   Neurodermatitis 04/23/2017   Current moderate episode of major depressive disorder without prior episode ([place]) 04/07/2017   Vitamin D deficiency 12/24/2016   OSA (obstructive sleep apnea) 03/29/2016   Paroxysmal atrial fibrillation ([place]) 11/08/2015   Varicose veins of right lower extremity with pain 04/13/2015   Routine general medical examination at a  health care facility 11/30/2013   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 [facility]) 10/20/2010   GLAUCOMA 08/04/2008   ALLERGIC RHINITIS 12/17/2007   Osteoporosis 12/17/2007   HYPERCHOLESTEROLEMIA 07/31/2007   Essential hypertension 07/31/2007   MITRAL VALVE PROLAPSE 07/31/2007   GERD 07/31/2007    Past Surgical History:  Procedure Laterality Date   ABDOMINAL HYSTERECTOMY  1996   BASAL CELL CARCINOMA EXCISION     06/2017   BREAST CYST ASPIRATION  2017   dental implant  12/21/2019   #31   EYE SURGERY  2009   Cataract   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 Cloyd Stagers, [facility]   s/p ORIF prox phalanx  02/2007   right little finger----Dr Fredna Dow     OB History   No obstetric history on file.     Family History  Problem Relation Age of Onset   Hypertension Mother    Breast cancer Mother        double mastectomy   Heart disease Mother    Diabetes Father    Heart disease Father    Prostate cancer Father    Esophageal cancer Father    Breast cancer Sister        breast (sister), spread to brain   Colon cancer Maternal Uncle     Social History   Tobacco Use   Smoking status: Former    Types: Cigarettes    Quit date: 08/27/1974    Years since quitting: 46.7   Smokeless tobacco: Never  Vaping Use   Vaping Use: Never used  Substance Use Topics   Alcohol use: No   Drug use: No    Home Medications Prior to Admission medications   Medication Sig Start Date End Date Taking? Authorizing Provider  Ascorbic Acid (VITAMIN C PO) Take 500 mg by mouth daily.     [provider]  brimonidine-timolol (COMBIGAN) 0.2-0.5 % ophthalmic solution Place 1 drop into both eyes every 12 (twelve) hours.     [provider]  calcium citrate (CALCITRATE - DOSED IN MG ELEMENTAL CALCIUM) 950 (200 Ca) MG tablet Take 200 mg of elemental calcium by mouth 2 (two) times daily.    [provider]  Cholecalciferol (VITAMIN D3) 1000 units CAPS Take  1,000 Units by mouth.     [provider]  Coenzyme Q10 (CO Q 10) 100 MG CAPS Take 1 tablet by mouth daily.     [provider]  dicyclomine (BENTYL) 10 MG capsule Take 10 mg by mouth as needed for spasms.    [provider]  diltiazem (CARDIZEM CD) 120 MG 24 hr capsule TAKE ONE CAPSULE BY MOUTH DAILY 04/11/21   Larey Dresser, MD  docusate sodium (COLACE) 100 MG capsule Take 200 mg by  mouth as needed.    [provider]  esomeprazole (NEXIUM) 20 MG packet Take 20 mg by mouth at bedtime. Takes as needed.    [provider]  gabapentin (NEURONTIN) 100 MG capsule Take 3 capsules (300 mg total) by mouth at bedtime. 09/05/20   Janith Lima, MD  loratadine (CLARITIN) 10 MG tablet Take by mouth daily as needed.     [provider]  METAMUCIL FIBER PO Take by mouth. 1 tsp by mouth once a day Patient not taking: Reported on 03/06/2021    [provider]  MINOXIDIL FOR MEN EX Apply topically. Takes as needed at night.    [provider]  mirtazapine (REMERON) 15 MG tablet Take 7.5 mg by mouth as needed. Takes 1/4 tablet at bedtime for itching of scalp    [provider]  Multiple Vitamins-Minerals (CENTRUM SILVER PO) Take 1 tablet by mouth daily. Patient not taking: Reported on 03/06/2021    [provider]  Multiple Vitamins-Minerals (PRESERVISION AREDS 2 PO) Take 1 tablet by mouth 2 (two) times daily.     [provider]  potassium chloride (MICRO-K) 10 MEQ CR capsule Take 1 capsule (10 mEq total) by mouth with breakfast, with lunch, and with evening meal. 09/08/20   Janith Lima, MD  pravastatin (PRAVACHOL) 40 MG tablet Take 1 tablet (40 mg total) by mouth at bedtime. 05/19/21   Janith Lima, MD  Probiotic Product (PROBIOTIC DAILY PO) Take 1 tablet by mouth daily.    [provider]  telmisartan-hydrochlorothiazide (MICARDIS HCT) 80-12.5 MG tablet TAKE 1 TABLET ONCE DAILY. 04/12/21   Janith Lima, MD  telmisartan-hydrochlorothiazide (MICARDIS HCT) 80-12.5 MG tablet TAKE 1 TABLET ONCE DAILY. 04/12/21   Janith Lima, MD  traMADol (ULTRAM) 50 MG tablet TAKE ONE TABLET EVERY 6 HOURS AS NEEDED. 03/21/21   Janith Lima, MD  tretinoin (RETIN-A) 0.05 % cream Once daily    [provider]  XARELTO 20 MG TABS tablet TAKE 1 TABLET DAILY WITH SUPPER. 05/12/21   Larey Dresser, MD    Allergies    Ibuprofen  Review of Systems   Review of Systems  Constitutional:  Negative for chills and fever.  HENT:  Negative for congestion.   Eyes:  Negative for pain.  Respiratory:  Negative for cough and shortness of breath.   Cardiovascular:  Negative for chest pain and leg swelling.  Gastrointestinal:  Negative for abdominal pain and vomiting.  Genitourinary:  Negative for dysuria.  Musculoskeletal:  Negative for myalgias.       Right arm, right ankle, right knee pain  Skin:  Negative for rash.  Neurological:  Positive for syncope and headaches. Negative for dizziness.   Physical Exam Updated Vital Signs BP 130/74   Pulse (!) 58   Temp 98.4 F (36.9 C)   Resp 16   SpO2 100%   Physical Exam Vitals and nursing note reviewed.  Constitutional:      General: She is not in acute distress. HENT:     Head: Normocephalic.     Comments: No lacerations or abrasions to the scalp.    Nose: Nose normal.     Mouth/Throat:     Mouth: Mucous membranes are moist.  Eyes:     General: No scleral icterus. Cardiovascular:     Rate and Rhythm: Normal rate and regular rhythm.     Pulses: Normal pulses.     Heart sounds: Normal heart sounds.  Comments: rrr Pulmonary:     Effort: Pulmonary effort is normal. No respiratory distress.     Breath sounds: No wheezing.  Abdominal:     Palpations: Abdomen is soft.     Tenderness: There is no abdominal tenderness. There is no guarding or rebound.  Musculoskeletal:     Cervical back: Normal range of motion.     Right lower leg: No  edema.     Left lower leg: No edema.     Comments: Somewhat diffuse mild tenderness to the palpation of right upper extremity no focal bony tenderness  Mild tenderness palpation of the right knee at the lateral aspect with full range of motion and ambulation without difficulty.  There is also some right lateral malleolus tenderness to palpation.  Neither knee or ankle are particularly tender.  There is no step-off deformity or bruising.  Skin:    General: Skin is warm and dry.     Capillary Refill: Capillary refill takes less than 2 seconds.  Neurological:     Mental Status: She is alert. Mental status is at baseline.     Comments: Alert and oriented to self, place, time and event.   Speech is fluent, clear without dysarthria or dysphasia.   Strength 5/5 in upper/lower extremities   Sensation intact in upper/lower extremities   CN I not tested  CN II grossly intact visual fields bilaterally. Did not visualize posterior eye.  CN III, IV, VI PERRLA and EOMs intact bilaterally  CN V Intact sensation to sharp and light touch to the face  CN VII facial movements symmetric  CN VIII not tested  CN IX, X no uvula deviation, symmetric rise of soft palate  CN XI 5/5 SCM and trapezius strength bilaterally  CN XII Midline tongue protrusion, symmetric L/R movements    Psychiatric:        Mood and Affect: Mood normal.        Behavior: Behavior normal.    ED Results / Procedures / Treatments   Labs (all labs ordered are listed, but only abnormal results are displayed) Labs Reviewed  BASIC METABOLIC PANEL  CBC  URINALYSIS, ROUTINE W REFLEX MICROSCOPIC    EKG EKG Interpretation  Date/Time:  Monday May 22 2021 12:40:22 EDT Ventricular Rate:  70 PR Interval:  146 QRS Duration: 74 QT Interval:  400 QTC Calculation: 432 R Axis:   44 Text Interpretation: Normal sinus rhythm Normal ECG Confirmed by Madalyn Rob [phone]) on 05/22/2021 2:07:35 PM  Radiology CT HEAD WO  CONTRAST  Result Date: 05/22/2021 CLINICAL DATA:  Head trauma, minor (Age >= 65y) EXAM: CT HEAD WITHOUT CONTRAST TECHNIQUE: Contiguous axial images were obtained from the base of the skull through the vertex without intravenous contrast. COMPARISON:  CT November 11, 2019. FINDINGS: Brain: No evidence of acute infarction, hemorrhage, hydrocephalus, extra-axial collection or mass lesion/mass effect. Mild for age patchy white matter hypoattenuation, nonspecific but compatible with chronic microvascular ischemic disease. Vascular: No hyperdense vessel identified. Calcific intracranial atherosclerosis. Skull: No acute fracture.  Hyperostosis frontalis. Sinuses/Orbits: Mild paranasal sinus mucosal thickening. No acute orbital findings. Other: No mastoid effusions. IMPRESSION: No evidence of acute intracranial abnormality. Electronically Signed   By: Margaretha Sheffield M.D.   On: 05/22/2021 14:29    Procedures Procedures   Medications Ordered in ED Medications - No data to display  ED Course  I have reviewed the triage vital signs and the nursing notes.  Pertinent labs & imaging results that were available during my care of  the patient were reviewed by me and considered in my medical decision making (see chart for details).    MDM Rules/Calculators/A&P                           Patient is 75 year old female presented to the ER today after 3 falls that occurred 2 days ago she states she felt well yesterday she felt that she was dehydrated the day that she had these falls her first fall was mechanical where she tripped over the curb and her second to falls sounded very vasovagal/perhaps orthostatic.  She feels that she is hydrated now denies any lightheadedness or dizziness.  Has been ambulating without difficulty yesterday and today.  Physical exam reassuring.  I discussed this case with my attending physician who cosigned this note including patient's presenting symptoms, physical exam, and planned  diagnostics and interventions. Attending physician stated agreement with plan or made changes to plan which were implemented.   Attending physician assessed patient at bedside.   CT head reviewed there is no intercranial hemorrhage or acute abnormality.  CBC without leukocytosis or anemia.  BMP unremarkable.  Normal sinus rhythm no ST elevations or depressions.  No acute abnormalities.  Patient ambulatory and feeling well at time of discharge.  Denies any symptoms currently.  Will discharge home with follow-up with her care team and primary care provider.  Return precautions were given.  She states she will return to the ER for new or concerning symptoms specifically any additional episodes of syncope.  We will take her medications and hydrate and eat well.  Final Clinical Impression(s) / ED Diagnoses Final diagnoses:  Syncope, unspecified syncope type    Rx / DC Orders ED Discharge Orders     None        Tedd Sias, [place] 05/22/21 1504    Lucrezia Starch, MD 05/24/21 [phone]

## 2021-05-22 NOTE — Telephone Encounter (Signed)
Pt called states she fell on Saturday at home. Wants to make sure she does have a concussion. Pt wanting to get scheduled. Pt requesting a call back.

## 2021-05-22 NOTE — Discharge Instructions (Signed)
Please follow-up with your neurologist to discuss your headaches, follow-up with your cardiologist and your primary care provider to discuss your syncopal episodes however your work-up today is quite reassuring your EKG is a normal EKG and ultimately I suspect that--as you described--your dehydration and lack of food on Saturday other reason for your fall/symptoms.  I will add that you already had x-rays of your right arm and leg after fall.  You may always return to the ER for any new or concerning symptoms.  Otherwise follow-up with your care team and take your prescribed occasions.

## 2021-05-22 NOTE — ED Notes (Signed)
Patient transported to CT 

## 2021-05-22 NOTE — ED Provider Notes (Signed)
Emergency Medicine Provider Triage Evaluation Note  Colleen White , a 75 y.o. female  was evaluated in triage.  Pt complains of multiple falls, lightheadedness and syncope.  Review of Systems  Positive: multiple falls, lightheadedness and syncope Negative: Chest pain  Physical Exam  BP 133/74 (BP Location: Left Arm)   Pulse 66   Temp 98.4 F (36.9 C)   Resp 16   SpO2 98%  Gen:   Awake, no distress   Resp:  Normal effort  MSK:   Moves extremities without difficulty  Other:  Clear speech, no facial droop, heart rrr  Medical Decision Making  Medically screening exam initiated at 12:46 PM.  Appropriate orders placed.  Colleen White was informed that the remainder of the evaluation will be completed by another provider, this initial triage assessment does not replace that evaluation, and the importance of remaining in the ED until their evaluation is complete.     Bishop Dublin 05/22/21 1246    Lucrezia Starch, MD 05/24/21 [phone]

## 2021-05-22 NOTE — Telephone Encounter (Signed)
I returned the patient's call.  She states last Saturday she escorted a friend to urgent care.  On the way out she tripped over the curb and fell on her right side.  She was checked out by urgent care and states they took an xray of her shoulder, elbow, knee, ankle and also checked her hip. Her glasses fell off but she did not think she hit her head. Pt went home. She had not had much to eat that day. She walked over to the refrigerator, became lightheaded and fell backwards and hit her head on the wood floor.  Her husband helped her and advised her to go to the ER but she refused.  Later that night she fell backwards again in the bathroom and hit her head on the tile floor.  Again, she said she would be okay and she did not go to the ER.  The patient reports some soreness on the back of her head and a knot which she has iced and the swelling has gone down.  She does not feel the headache that was present before the fall is really any worse and she denies any other new neurologic symptoms.  The patient is on Xarelto. A friend asked her if she should get checked for a concussion. I have advised her to go to the ER for evaluation as there is risk for bleeding since she hit her head. The patient stated she would try to go today. She still wanted to make a separate appt to address the pre-existing headaches. We had offered her an appt last week but she couldn't make it so I scheduled her with Jinny Blossom NP this Wed 9/28 @ 1100 AM arrival 1030.

## 2021-05-23 DIAGNOSIS — Z20828 Contact with and (suspected) exposure to other viral communicable diseases: Secondary | ICD-10-CM | POA: Diagnosis not present

## 2021-05-24 ENCOUNTER — Telehealth (INDEPENDENT_AMBULATORY_CARE_PROVIDER_SITE_OTHER): Payer: Medicare Other | Admitting: Nurse Practitioner

## 2021-05-24 ENCOUNTER — Other Ambulatory Visit: Payer: Self-pay

## 2021-05-24 ENCOUNTER — Ambulatory Visit: Payer: Medicare Other | Admitting: Adult Health

## 2021-05-24 DIAGNOSIS — R059 Cough, unspecified: Secondary | ICD-10-CM

## 2021-05-24 DIAGNOSIS — U071 COVID-19: Secondary | ICD-10-CM | POA: Insufficient documentation

## 2021-05-24 MED ORDER — HYDROCOD POLST-CPM POLST ER 10-8 MG/5ML PO SUER: 5.0000 mL | Freq: Every evening | ORAL | 0 refills | Status: DC | PRN

## 2021-05-24 MED ORDER — MOLNUPIRAVIR EUA 200MG CAPSULE: 4.0000 | ORAL_CAPSULE | Freq: Two times a day (BID) | ORAL | 0 refills | Status: AC

## 2021-05-24 NOTE — Assessment & Plan Note (Signed)
Related to COVID-19.  States that she is gotten Tussionex in the past that works well.  Did check [facility] PDMP and it showed the same.  Patient is on tramadol as needed.  Looks like she is on approximately 2 tablets of tramadol per day as needed.

## 2021-05-24 NOTE — Progress Notes (Addendum)
Patient ID: Colleen White, female    DOB: 1945-09-12, 75 y.o.   MRN: [id number]  Virtual visit completed through Caroline, a video enabled telemedicine application. Due to national recommendations of social distancing due to COVID-19, a virtual visit is felt to be most appropriate for this patient at this time. Reviewed limitations, risks, security and privacy concerns of performing a virtual visit and the availability of in person appointments. I also reviewed that there may be a patient responsible charge related to this service. The patient agreed to proceed.   Attempted to connect with video enabled program, was unsuccessful. Reverted to telephone encounter.  Patient location: home Provider location: [place] at [facility], office Persons participating in this virtual visit: patient, provider   If any vitals were documented, they were collected by patient at home unless specified below.    There were no vitals taken for this visit.   CC: Covid 19 Infection  Subjective:   HPI: Colleen White is a 75 y.o. female presenting on 05/24/2021 for Covid Positive (Positive last night, symptoms started Sunday, hoarse, sore throat, weakness, nasal congestion, body aches, fever off and on, dry cough )   [facility]. Symptoms started on yesterday 05/23/2021 Exposed on Sunday 05/21/2021 Tylenol for fever, effective    Relevant past medical, surgical, family and social history reviewed and updated as indicated. Interim medical history since our last visit reviewed. Allergies and medications reviewed and updated. Outpatient Medications Prior to Visit  Medication Sig Dispense Refill   Ascorbic Acid (VITAMIN C PO) Take 500 mg by mouth daily.      brimonidine-timolol (COMBIGAN) 0.2-0.5 % ophthalmic solution Place 1 drop into both eyes every 12 (twelve) hours.      calcium citrate (CALCITRATE - DOSED IN MG ELEMENTAL CALCIUM) 950 (200 Ca) MG  tablet Take 200 mg of elemental calcium by mouth 2 (two) times daily.     Cholecalciferol (VITAMIN D3) 1000 units CAPS Take 1,000 Units by mouth.      Coenzyme Q10 (CO Q 10) 100 MG CAPS Take 1 tablet by mouth daily.      dicyclomine (BENTYL) 10 MG capsule Take 10 mg by mouth as needed for spasms.     diltiazem (CARDIZEM CD) 120 MG 24 hr capsule TAKE ONE CAPSULE BY MOUTH DAILY 90 capsule 3   docusate sodium (COLACE) 100 MG capsule Take 200 mg by mouth as needed.     esomeprazole (NEXIUM) 20 MG packet Take 20 mg by mouth at bedtime. Takes as needed.     gabapentin (NEURONTIN) 100 MG capsule Take 3 capsules (300 mg total) by mouth at bedtime. 270 capsule 1   loratadine (CLARITIN) 10 MG tablet Take by mouth daily as needed.      METAMUCIL FIBER PO Take by mouth. 1 tsp by mouth once a day     MINOXIDIL FOR MEN EX Apply topically. Takes as needed at night.     mirtazapine (REMERON) 15 MG tablet Take 7.5 mg by mouth as needed. Takes 1/4 tablet at bedtime for itching of scalp     Multiple Vitamins-Minerals (CENTRUM SILVER PO) Take 1 tablet by mouth daily.     Multiple Vitamins-Minerals (PRESERVISION AREDS 2 PO) Take 1 tablet by mouth 2 (two) times daily.      potassium chloride (MICRO-K) 10 MEQ CR capsule Take 1 capsule (10 mEq total) by mouth with breakfast, with lunch, and with evening meal. 270 capsule 1   pravastatin (  PRAVACHOL) 40 MG tablet Take 1 tablet (40 mg total) by mouth at bedtime. 90 tablet 1   Probiotic Product (PROBIOTIC DAILY PO) Take 1 tablet by mouth daily.     telmisartan-hydrochlorothiazide (MICARDIS HCT) 80-12.5 MG tablet TAKE 1 TABLET ONCE DAILY. 90 tablet 1   telmisartan-hydrochlorothiazide (MICARDIS HCT) 80-12.5 MG tablet TAKE 1 TABLET ONCE DAILY. 90 tablet 1   traMADol (ULTRAM) 50 MG tablet TAKE ONE TABLET EVERY 6 HOURS AS NEEDED. 65 tablet 3   tretinoin (RETIN-A) 0.05 % cream Once daily     XARELTO 20 MG TABS tablet TAKE 1 TABLET DAILY WITH SUPPER. 30 tablet 6   No  facility-administered medications prior to visit.     Per HPI unless specifically indicated in ROS section below Review of Systems  Constitutional:  Positive for fatigue and fever. Negative for chills.  HENT:  Positive for congestion, sore throat and voice change (hoarseness).   Respiratory:  Positive for cough. Negative for shortness of breath.   Cardiovascular:  Negative for chest pain.  Gastrointestinal:  Negative for diarrhea, nausea and vomiting.  Musculoskeletal:  Positive for myalgias.  Neurological:  Positive for weakness and headaches.  Objective:  There were no vitals taken for this visit.  Wt Readings from Last 3 Encounters:  04/30/21 150 lb (68 kg)  03/06/21 152 lb (68.9 kg)  01/05/21 155 lb 12.8 oz (70.7 kg)       Physical exam: Gen: alert Pulm: speaks in complete sentences without increased work of breathing Psych: normal mood, normal thought content      Results for orders placed or performed during the hospital encounter of 88/50/27  Basic metabolic panel  Result Value Ref Range   Sodium 138 135 - 145 mmol/L   Potassium 4.1 3.5 - 5.1 mmol/L   Chloride 100 98 - 111 mmol/L   CO2 30 22 - 32 mmol/L   Glucose, Bld 96 70 - 99 mg/dL   BUN 14 8 - 23 mg/dL   Creatinine, Ser 0.79 0.44 - 1.00 mg/dL   Calcium 9.3 8.9 - 10.3 mg/dL   GFR, Estimated >60 >60 mL/min   Anion gap 8 5 - 15  CBC  Result Value Ref Range   WBC 6.7 4.0 - 10.5 K/uL   RBC 4.07 3.87 - 5.11 MIL/uL   Hemoglobin 12.9 12.0 - 15.0 g/dL   HCT 38.7 36.0 - 46.0 %   MCV 95.1 80.0 - 100.0 fL   MCH 31.7 26.0 - 34.0 pg   MCHC 33.3 30.0 - 36.0 g/dL   RDW 12.2 11.5 - 15.5 %   Platelets 204 150 - 400 K/uL   nRBC 0.0 0.0 - 0.2 %   Assessment & Plan:   Problem List Items Addressed This Visit       Other   Cough    Related to COVID-19.  States that she is gotten Tussionex in the past that works well.  Did check [facility] PDMP and it showed the same.  Patient is on tramadol as needed.  Looks like  she is on approximately 2 tablets of tramadol per day as needed.      Relevant Medications   chlorpheniramine-HYDROcodone (TUSSIONEX PENNKINETIC ER) 10-8 MG/5ML SUER   COVID-19 - Primary    Went to a testing center diagnosed with COVID-19.  Discussed treatment options given she is on blood thinner and calcium channel blocker we will opt for molnupiravir.  Start this medication as soon as possible.  Did discuss side effects related to  this medication.  Signs and symptoms reviewed as when to seek emergent or urgent health care.  Patient acknowledged.      Relevant Medications   molnupiravir EUA (LAGEVRIO) 200 mg CAPS capsule     No orders of the defined types were placed in this encounter.  No orders of the defined types were placed in this encounter.  Phone call was 10 minutes and 00 seconds in length   I discussed the assessment and treatment plan with the patient. The patient was provided an opportunity to ask questions and all were answered. The patient agreed with the plan and demonstrated an understanding of the instructions. The patient was advised to call back or seek an in-person evaluation if the symptoms worsen or if the condition fails to improve as anticipated.  Follow up plan: No follow-ups on file.  Romilda Garret, NP

## 2021-05-24 NOTE — Assessment & Plan Note (Addendum)
Went to a testing center diagnosed with COVID-19.  Discussed treatment options given she is on blood thinner and calcium channel blocker we will opt for molnupiravir.  Start this medication as soon as possible.  Did discuss side effects related to this medication.  Signs and symptoms reviewed as when to seek emergent or urgent health care.  Patient acknowledged.  Did discuss CDC recommended quarantine guidelines.  Patient will quarantine through Sunday her symptoms are improving and she has been fever free for 24 hours without use of antipyretic medication patient may return to society with masks for the next additional 5 days.

## 2021-05-25 ENCOUNTER — Other Ambulatory Visit: Payer: Self-pay | Admitting: Internal Medicine

## 2021-05-25 ENCOUNTER — Telehealth: Payer: Self-pay | Admitting: Internal Medicine

## 2021-05-25 DIAGNOSIS — J208 Acute bronchitis due to other specified organisms: Secondary | ICD-10-CM

## 2021-05-25 DIAGNOSIS — R059 Cough, unspecified: Secondary | ICD-10-CM

## 2021-05-25 DIAGNOSIS — U071 COVID-19: Secondary | ICD-10-CM

## 2021-05-25 MED ORDER — HYDROCOD POLST-CPM POLST ER 10-8 MG/5ML PO SUER: 5.0000 mL | Freq: Two times a day (BID) | ORAL | 0 refills | Status: DC | PRN

## 2021-05-25 NOTE — Telephone Encounter (Signed)
Wants to know if Dr. Ronnald Ramp is willing to send another rx for chlorpheniramine-HYDROcodone [facility] ER) 10-8 MG/5ML SURE to pharmacy in case the current one is not enough  Pharmacy: [place], [place]  Phone:  [phone] Fax:  [phone]

## 2021-05-25 NOTE — Telephone Encounter (Signed)
Patient had VV w/ provider yesterday 09.28.22  Was prescribed for cough  Patient says normally Dr. Ronnald Ramp has her taking rx 2x daily instead of 1  Wants to know if Dr.

## 2021-06-05 DIAGNOSIS — N3000 Acute cystitis without hematuria: Secondary | ICD-10-CM | POA: Diagnosis not present

## 2021-06-05 DIAGNOSIS — R8271 Bacteriuria: Secondary | ICD-10-CM | POA: Diagnosis not present

## 2021-06-08 ENCOUNTER — Telehealth: Payer: Self-pay

## 2021-06-08 NOTE — Telephone Encounter (Signed)
Please advise as the pt has stated she is needing to see Dr. Ronnald Ramp as she is still having a cough since she tested POS for COVID on 05/23/2021.  **Pt was offered a visit with another provider and she has refused as she is wanting Dr. Ronnald Ramp to work in for her cough cause the cough meds he rx'd is not working.   **Pt can be reached at [phone]

## 2021-06-14 DIAGNOSIS — N3000 Acute cystitis without hematuria: Secondary | ICD-10-CM | POA: Diagnosis not present

## 2021-06-16 ENCOUNTER — Telehealth: Payer: Self-pay | Admitting: Internal Medicine

## 2021-06-16 NOTE — Telephone Encounter (Signed)
Pt. Has called and has asked when she should receive her flu shot and COVID booster. States that she was diagnosed with COVID on Sept. 27,2022. She says she doesn't want anything to coincide negatively after taking antibiotics for COVID and receiving the two shots. States she still has a cough.  Please advise.  Callback #- [phone]

## 2021-06-19 NOTE — Telephone Encounter (Signed)
Pt scheduled for tomorrow 11/25 @ 11.20am

## 2021-06-20 ENCOUNTER — Ambulatory Visit (INDEPENDENT_AMBULATORY_CARE_PROVIDER_SITE_OTHER): Payer: Medicare Other | Admitting: Internal Medicine

## 2021-06-20 ENCOUNTER — Ambulatory Visit (INDEPENDENT_AMBULATORY_CARE_PROVIDER_SITE_OTHER): Payer: Medicare Other

## 2021-06-20 ENCOUNTER — Encounter: Payer: Self-pay | Admitting: Internal Medicine

## 2021-06-20 ENCOUNTER — Other Ambulatory Visit: Payer: Self-pay

## 2021-06-20 VITALS — BP 130/62 | HR 84 | Temp 98.7°F | Resp 16 | Ht 63.0 in | Wt 150.0 lb

## 2021-06-20 DIAGNOSIS — I10 Essential (primary) hypertension: Secondary | ICD-10-CM

## 2021-06-20 DIAGNOSIS — R052 Subacute cough: Secondary | ICD-10-CM

## 2021-06-20 DIAGNOSIS — J208 Acute bronchitis due to other specified organisms: Secondary | ICD-10-CM | POA: Diagnosis not present

## 2021-06-20 DIAGNOSIS — U071 COVID-19: Secondary | ICD-10-CM

## 2021-06-20 DIAGNOSIS — I480 Paroxysmal atrial fibrillation: Secondary | ICD-10-CM

## 2021-06-20 DIAGNOSIS — R059 Cough, unspecified: Secondary | ICD-10-CM | POA: Diagnosis not present

## 2021-06-20 NOTE — Progress Notes (Addendum)
Subjective:  Patient ID: Colleen White, female    DOB: 23-Mar-1946  Age: 75 y.o. MRN: [id number]  CC: Cough, Hypertension, and Atrial Fibrillation  This visit occurred during the SARS-CoV-2 public health emergency.  Safety protocols were in place, including screening questions prior to the visit, additional usage of staff PPE, and extensive cleaning of exam room while observing appropriate contact time as indicated for disinfecting solutions.    HPI Colleen White presents for f/up -  She was diagnosed with a COVID-19 infection about a month ago.  She has completed the antivirals.  She is getting better.  She has a persistent, mild, nonproductive cough that is improving.  She requests a refill of the cough suppressant.  She denies chest pain, hemoptysis, fever, chills, night sweats, shortness of breath, or wheezing.   Outpatient Medications Prior to Visit  Medication Sig Dispense Refill   Ascorbic Acid (VITAMIN C PO) Take 500 mg by mouth daily.      brimonidine-timolol (COMBIGAN) 0.2-0.5 % ophthalmic solution Place 1 drop into both eyes every 12 (twelve) hours.      calcium citrate (CALCITRATE - DOSED IN MG ELEMENTAL CALCIUM) 950 (200 Ca) MG tablet Take 200 mg of elemental calcium by mouth 2 (two) times daily.     Cholecalciferol (VITAMIN D3) 1000 units CAPS Take 1,000 Units by mouth.      Coenzyme Q10 (CO Q 10) 100 MG CAPS Take 1 tablet by mouth daily.      dicyclomine (BENTYL) 10 MG capsule Take 10 mg by mouth as needed for spasms.     diltiazem (CARDIZEM CD) 120 MG 24 hr capsule TAKE ONE CAPSULE BY MOUTH DAILY 90 capsule 3   docusate sodium (COLACE) 100 MG capsule Take 200 mg by mouth as needed.     esomeprazole (NEXIUM) 20 MG packet Take 20 mg by mouth at bedtime. Takes as needed.     gabapentin (NEURONTIN) 100 MG capsule Take 3 capsules (300 mg total) by mouth at bedtime. 270 capsule 1   loratadine (CLARITIN) 10 MG tablet Take by mouth daily as needed.      METAMUCIL FIBER PO  Take by mouth. 1 tsp by mouth once a day     MINOXIDIL FOR MEN EX Apply topically. Takes as needed at night.     mirtazapine (REMERON) 15 MG tablet Take 7.5 mg by mouth as needed. Takes 1/4 tablet at bedtime for itching of scalp     Multiple Vitamins-Minerals (CENTRUM SILVER PO) Take 1 tablet by mouth daily.     Multiple Vitamins-Minerals (PRESERVISION AREDS 2 PO) Take 1 tablet by mouth 2 (two) times daily.      potassium chloride (MICRO-K) 10 MEQ CR capsule Take 1 capsule (10 mEq total) by mouth with breakfast, with lunch, and with evening meal. 270 capsule 1   pravastatin (PRAVACHOL) 40 MG tablet Take 1 tablet (40 mg total) by mouth at bedtime. 90 tablet 1   Probiotic Product (PROBIOTIC DAILY PO) Take 1 tablet by mouth daily.     telmisartan-hydrochlorothiazide (MICARDIS HCT) 80-12.5 MG tablet TAKE 1 TABLET ONCE DAILY. 90 tablet 1   telmisartan-hydrochlorothiazide (MICARDIS HCT) 80-12.5 MG tablet TAKE 1 TABLET ONCE DAILY. 90 tablet 1   traMADol (ULTRAM) 50 MG tablet TAKE ONE TABLET EVERY 6 HOURS AS NEEDED. 65 tablet 3   tretinoin (RETIN-A) 0.05 % cream Once daily     XARELTO 20 MG TABS tablet TAKE 1 TABLET DAILY WITH SUPPER. 30 tablet 6   chlorpheniramine-HYDROcodone (TUSSIONEX PENNKINETIC ER)  10-8 MG/5ML SUER Take 5 mLs by mouth every 12 (twelve) hours as needed for cough. 115 mL 0   No facility-administered medications prior to visit.    ROS Review of Systems  Constitutional:  Negative for diaphoresis, fatigue and unexpected weight change.  HENT:  Negative for sore throat.   Eyes: Negative.   Respiratory:  Positive for cough. Negative for chest tightness, shortness of breath and wheezing.   Cardiovascular:  Negative for chest pain, palpitations and leg swelling.  Gastrointestinal:  Negative for abdominal pain, constipation, diarrhea, nausea and vomiting.  Endocrine: Negative.   Genitourinary: Negative.  Negative for difficulty urinating and dysuria.  Musculoskeletal: Negative.   Negative for arthralgias.  Skin: Negative.   Neurological:  Negative for dizziness, weakness and light-headedness.  Hematological:  Negative for adenopathy. Does not bruise/bleed easily.  Psychiatric/Behavioral: Negative.     Objective:  BP 130/62 (BP Location: Left Arm, Patient Position: Sitting, Cuff Size: Large)   Pulse 84   Temp 98.7 F (37.1 C) (Oral)   Resp 16   Ht 5' 3"  (1.6 m)   Wt 150 lb (68 kg)   SpO2 95%   BMI 26.57 kg/m   BP Readings from Last 3 Encounters:  06/20/21 130/62  05/22/21 130/74  04/30/21 118/65    Wt Readings from Last 3 Encounters:  06/20/21 150 lb (68 kg)  04/30/21 150 lb (68 kg)  03/06/21 152 lb (68.9 kg)    Physical Exam Vitals reviewed.  HENT:     Nose: Nose normal.     Mouth/Throat:     Mouth: Mucous membranes are moist.  Eyes:     General: No scleral icterus.    Conjunctiva/sclera: Conjunctivae normal.  Cardiovascular:     Rate and Rhythm: Normal rate and regular rhythm.     Heart sounds: Murmur heard.  Systolic murmur is present with a grade of 1/6.  No diastolic murmur is present.    No gallop.  Pulmonary:     Effort: Pulmonary effort is normal.     Breath sounds: No stridor. No wheezing, rhonchi or rales.  Abdominal:     General: Abdomen is flat.     Palpations: There is no mass.     Tenderness: There is no abdominal tenderness. There is no guarding.     Hernia: No hernia is present.  Musculoskeletal:        General: Normal range of motion.     Cervical back: Neck supple.     Right lower leg: No edema.     Left lower leg: No edema.  Skin:    General: Skin is warm.  Neurological:     General: No focal deficit present.     Mental Status: She is alert.  Psychiatric:        Mood and Affect: Mood normal.        Behavior: Behavior normal.    Lab Results  Component Value Date   WBC 6.7 05/22/2021   HGB 12.9 05/22/2021   HCT 38.7 05/22/2021   PLT 204 05/22/2021   GLUCOSE 96 05/22/2021   CHOL 144 07/19/2020   TRIG  56 07/19/2020   HDL 46 07/19/2020   LDLDIRECT 142.5 09/30/2012   LDLCALC 87 07/19/2020   ALT 20 03/06/2021   AST 19 03/06/2021   NA 138 05/22/2021   K 4.1 05/22/2021   CL 100 05/22/2021   CREATININE 0.79 05/22/2021   BUN 14 05/22/2021   CO2 30 05/22/2021   TSH 1.69 03/06/2021  INR 1.7 (H) 03/06/2021   HGBA1C 5.8 09/10/2018    CT HEAD WO CONTRAST  Result Date: 05/22/2021 CLINICAL DATA:  Head trauma, minor (Age >= 65y) EXAM: CT HEAD WITHOUT CONTRAST TECHNIQUE: Contiguous axial images were obtained from the base of the skull through the vertex without intravenous contrast. COMPARISON:  CT November 11, 2019. FINDINGS: Brain: No evidence of acute infarction, hemorrhage, hydrocephalus, extra-axial collection or mass lesion/mass effect. Mild for age patchy white matter hypoattenuation, nonspecific but compatible with chronic microvascular ischemic disease. Vascular: No hyperdense vessel identified. Calcific intracranial atherosclerosis. Skull: No acute fracture.  Hyperostosis frontalis. Sinuses/Orbits: Mild paranasal sinus mucosal thickening. No acute orbital findings. Other: No mastoid effusions. IMPRESSION: No evidence of acute intracranial abnormality. Electronically Signed   By: Margaretha Sheffield M.D.   On: 05/22/2021 14:29   DG Chest 2 View  Result Date: 06/23/2021 CLINICAL DATA:  Patient complains of dry cough for 1 month. History of A-fib and hypertension. Former smoker. EXAM: CHEST - 2 VIEW COMPARISON:  X-ray chest 08/01/2020. FINDINGS: The heart size and mediastinal contours are within normal limits. Both lungs are clear. The visualized skeletal structures are unremarkable. IMPRESSION: No active cardiopulmonary disease. Electronically Signed   By: Davina Poke D.O.   On: 06/23/2021 09:26     Assessment & Plan:   Colleen White was seen today for cough, hypertension and atrial fibrillation.  Diagnoses and all orders for this visit:  Subacute cough- Her chest x-ray is negative for mass or  infiltrate. -     DG Chest 2 View; Future  Essential hypertension- Her blood pressure is adequately well controlled.  Paroxysmal atrial fibrillation ([place])- She has good rate and rhythm control.  Will continue the DOAC.  Acute bronchitis due to COVID-19 virus- Improvement noted. -     chlorpheniramine-HYDROcodone (TUSSIONEX PENNKINETIC ER) 10-8 MG/5ML SUER; Take 5 mLs by mouth every 12 (twelve) hours as needed for cough.  I am having Colleen White maintain her tretinoin, Multiple Vitamins-Minerals (PRESERVISION AREDS 2 PO), Probiotic Product (PROBIOTIC DAILY PO), brimonidine-timolol, loratadine, Vitamin D3, mirtazapine, docusate sodium, esomeprazole, Co Q 10, Ascorbic Acid (VITAMIN C PO), calcium citrate, MINOXIDIL FOR MEN EX, dicyclomine, gabapentin, potassium chloride, Multiple Vitamins-Minerals (CENTRUM SILVER PO), METAMUCIL FIBER PO, traMADol, diltiazem, telmisartan-hydrochlorothiazide, telmisartan-hydrochlorothiazide, Xarelto, pravastatin, and chlorpheniramine-HYDROcodone.  Meds ordered this encounter  Medications   chlorpheniramine-HYDROcodone (TUSSIONEX PENNKINETIC ER) 10-8 MG/5ML SUER    Sig: Take 5 mLs by mouth every 12 (twelve) hours as needed for cough.    Dispense:  115 mL    Refill:  0      Follow-up: No follow-ups on file.  Scarlette Calico, MD

## 2021-06-23 ENCOUNTER — Other Ambulatory Visit: Payer: Self-pay | Admitting: Internal Medicine

## 2021-06-23 DIAGNOSIS — U071 COVID-19: Secondary | ICD-10-CM

## 2021-06-23 DIAGNOSIS — J208 Acute bronchitis due to other specified organisms: Secondary | ICD-10-CM

## 2021-06-23 MED ORDER — HYDROCOD POLST-CPM POLST ER 10-8 MG/5ML PO SUER: 5.0000 mL | Freq: Two times a day (BID) | ORAL | 0 refills | Status: DC | PRN

## 2021-07-07 ENCOUNTER — Ambulatory Visit: Payer: Medicare Other

## 2021-07-10 ENCOUNTER — Ambulatory Visit: Payer: Medicare Other

## 2021-07-11 ENCOUNTER — Ambulatory Visit (INDEPENDENT_AMBULATORY_CARE_PROVIDER_SITE_OTHER): Payer: Medicare Other | Admitting: Cardiology

## 2021-07-11 ENCOUNTER — Encounter: Payer: Self-pay | Admitting: Cardiology

## 2021-07-11 ENCOUNTER — Other Ambulatory Visit: Payer: Self-pay

## 2021-07-11 VITALS — BP 136/74 | HR 82 | Ht 63.0 in | Wt 154.2 lb

## 2021-07-11 DIAGNOSIS — I480 Paroxysmal atrial fibrillation: Secondary | ICD-10-CM | POA: Diagnosis not present

## 2021-07-11 DIAGNOSIS — G4733 Obstructive sleep apnea (adult) (pediatric): Secondary | ICD-10-CM | POA: Diagnosis not present

## 2021-07-11 DIAGNOSIS — I10 Essential (primary) hypertension: Secondary | ICD-10-CM

## 2021-07-11 NOTE — Patient Instructions (Signed)

## 2021-07-11 NOTE — Progress Notes (Signed)
Date:  07/11/2021   ID:  Colleen White, DOB 02-27-1946, MRN [id number]   PCP:  Janith Lima, MD  Cardiologist:  [name], MD Sleep Medicine:  Fransico Him, MD Electrophysiologist:  None   Chief Complaint:  OSA  History of Present Illness:    Colleen White is a 75 y.o. female with a hx of mild OSA with an AHI of 9.1/hr and is on CPAP at 7cm H2O.   She is doing well with her CPAP device and thinks that she has gotten used to it.  She tolerates the mask and feels the pressure is adequate.  Since going on CPAP she feels rested in the am and has no significant daytime sleepiness.  She denies any significant nasal dryness or nasal congestion.  She does have mouth dryness.  She does not think that he snores.     Prior CV studies:   The following studies were reviewed today:  PAP compliance download  Past Medical History:  Diagnosis Date   Allergic rhinitis, cause unspecified    Alopecia    Anxiety    Colon polyps    Diverticulosis of colon (without mention of hemorrhage)    Fibrocystic breast disease    GERD (gastroesophageal reflux disease)    Glaucoma    Headache(784.0)    Hemorrhoids    History of echocardiogram    Echo 3/17:  Vigorous LVF, EF 65-70%, normal wall motion, grade 1 diastolic, mildly elevated LVOT velocity of 2.2 m/s-likely related to vigorous LVF   History of stress test    Myoview normal in 2/05. //  Stress echo (11/15) with no evidence for ischemia or infarction.   HLD (hyperlipidemia)    HTN (hypertension)    Hypertension    Mitral valve disorders(424.0)    Non Hodgkin's lymphoma (HCC)    Treated at [facility] with surgery and XRT.  Did not have chemotherapy.    OSA (obstructive sleep apnea) 03/29/2016   Osteopenia    PAF (paroxysmal atrial fibrillation) ([place])    a. CHADS2-VASc=3 //  b. Xarelto started in 2017   Pulmonary nodule    Presumed benign after observation.    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 Cloyd Stagers, [facility]   s/p ORIF prox phalanx  02/2007   right little finger----Dr Fredna Dow     Current Meds  Medication Sig   Ascorbic Acid (VITAMIN C PO) Take 500 mg by mouth daily.    brimonidine-timolol (COMBIGAN) 0.2-0.5 % ophthalmic solution Place 1 drop into both eyes every 12 (twelve) hours.    calcium citrate (CALCITRATE - DOSED IN MG ELEMENTAL CALCIUM) 950 (200 Ca) MG tablet Take 200 mg of elemental calcium by mouth 2 (two) times daily.   chlorpheniramine-HYDROcodone (TUSSIONEX PENNKINETIC ER) 10-8 MG/5ML SUER Take 5 mLs by mouth every 12 (twelve) hours as needed for cough.   Cholecalciferol (VITAMIN D3) 1000 units CAPS Take 1,000 Units by mouth.    Coenzyme Q10 (CO Q 10) 100 MG CAPS Take 1 tablet by mouth daily.    dicyclomine (BENTYL) 10 MG capsule  Take 10 mg by mouth as needed for spasms.   diltiazem (CARDIZEM CD) 120 MG 24 hr capsule TAKE ONE CAPSULE BY MOUTH DAILY   docusate sodium (COLACE) 100 MG capsule Take 200 mg by mouth as needed.   esomeprazole (NEXIUM) 20 MG packet Take 20 mg by mouth at bedtime. Takes as needed.   gabapentin (NEURONTIN) 100 MG capsule Take 3 capsules (300 mg total) by mouth at bedtime.   loratadine (CLARITIN) 10 MG tablet Take by mouth daily as needed.    METAMUCIL FIBER PO Take by mouth. 1 tsp by mouth once a day   MINOXIDIL FOR MEN EX Apply topically. Takes as needed at night.   mirtazapine (REMERON) 15 MG tablet Take 7.5 mg by mouth as needed. Takes 1/4 tablet at bedtime for itching of scalp   Multiple Vitamins-Minerals (CENTRUM SILVER PO) Take 1 tablet by mouth daily.   Multiple Vitamins-Minerals (PRESERVISION AREDS 2 PO) Take 1 tablet by mouth 2 (two) times  daily.    potassium chloride (MICRO-K) 10 MEQ CR capsule Take 1 capsule (10 mEq total) by mouth with breakfast, with lunch, and with evening meal.   pravastatin (PRAVACHOL) 40 MG tablet Take 1 tablet (40 mg total) by mouth at bedtime.   Probiotic Product (PROBIOTIC DAILY PO) Take 1 tablet by mouth daily.   telmisartan-hydrochlorothiazide (MICARDIS HCT) 80-12.5 MG tablet TAKE 1 TABLET ONCE DAILY.   traMADol (ULTRAM) 50 MG tablet TAKE ONE TABLET EVERY 6 HOURS AS NEEDED.   tretinoin (RETIN-A) 0.05 % cream Once daily   XARELTO 20 MG TABS tablet TAKE 1 TABLET DAILY WITH SUPPER.     Allergies:   Ibuprofen   Social History   Tobacco Use   Smoking status: Former    Types: Cigarettes    Quit date: 08/27/1974    Years since quitting: 46.9   Smokeless tobacco: Never  Vaping Use   Vaping Use: Never used  Substance Use Topics   Alcohol use: No   Drug use: No     Family Hx: The patient's family history includes Breast cancer in her mother and sister; Colon cancer in her maternal uncle; Diabetes in her father; Esophageal cancer in her father; Heart disease in her father and mother; Hypertension in her mother; Prostate cancer in her father.  ROS:   Please see the history of present illness.     All other systems reviewed and are negative.   Labs/Other Tests and Data Reviewed:    Recent Labs: 03/06/2021: ALT 20; Magnesium 2.1; TSH 1.69 05/22/2021: BUN 14; Creatinine, Ser 0.79; Hemoglobin 12.9; Platelets 204; Potassium 4.1; Sodium 138   Recent Lipid Panel Lab Results  Component Value Date/Time   CHOL 144 07/19/2020 10:51 AM   TRIG 56 07/19/2020 10:51 AM   HDL 46 07/19/2020 10:51 AM   CHOLHDL 3.1 07/19/2020 10:51 AM   LDLCALC 87 07/19/2020 10:51 AM   LDLDIRECT 142.5 09/30/2012 11:10 AM    Wt Readings from Last 3 Encounters:  07/11/21 154 lb 3.2 oz (69.9 kg)  06/20/21 150 lb (68 kg)  04/30/21 150 lb (68 kg)     Objective:    Vital Signs:  BP 136/74   Pulse 82   Ht 5' 3"  (1.6 m)    Wt 154 lb 3.2 oz (69.9 kg)   SpO2 97%   BMI 27.32 kg/m    GEN: Well nourished, well developed in no acute distress HEENT: Normal NECK: No JVD; No carotid bruits LYMPHATICS: No lymphadenopathy CARDIAC:RRR, no murmurs, rubs, gallops  RESPIRATORY:  Clear to auscultation without rales, wheezing or rhonchi  ABDOMEN: Soft, non-tender, non-distended MUSCULOSKELETAL:  No edema; No deformity  SKIN: Warm and dry1 NEUROLOGIC:  Alert and oriented x 3 PSYCHIATRIC:  Normal affect    ASSESSMENT & PLAN:    1. OSA - The patient is tolerating PAP therapy well without any problems. The PAP download performed by his DME was personally reviewed and interpreted by me today and showed an AHI of 1.1/hr on 7 cm H2O with 100% compliance in using more than 4 hours nightly.  The patient has been using and benefiting from PAP use and will continue to benefit from therapy.  -she would like to get a new device as hers is 75 years old -she does not want a chin strap to help with her mouth dryness   2.  HTN -Bp is well controlled on exam today. -continue on prescription drug management with Cardizem CD 120 mg daily, telmisartan HCT 80-12.5 mg daily with as needed refills   3.  PAF -she remains in normal sinus rhythm and denies any palpitations -She has not had any bleeding problems on DOAC -Continue prescription drug management with Cardizem CD 120 mg daily and Xarelto 20 mg daily with as needed refills  -I have personally reviewed and interpreted outside labs performed by patient's PCP which showed serum creatinine 0.79 and potassium 4.1 hemoglobin 12.9 on 05/22/2021   Medication Adjustments/Labs and Tests Ordered: Current medicines are reviewed at length with the patient today.  Concerns regarding medicines are outlined above.  Tests Ordered: No orders of the defined types were placed in this encounter.  Medication Changes: No orders of the defined types were placed in this encounter.   Disposition:   Follow up in 1 year(s)  Signed, Fransico Him, MD  07/11/2021 9:09 AM    [place]

## 2021-07-12 ENCOUNTER — Ambulatory Visit: Payer: Medicare Other

## 2021-07-12 ENCOUNTER — Ambulatory Visit (INDEPENDENT_AMBULATORY_CARE_PROVIDER_SITE_OTHER): Payer: Medicare Other

## 2021-07-12 DIAGNOSIS — Z23 Encounter for immunization: Secondary | ICD-10-CM

## 2021-07-12 NOTE — Progress Notes (Signed)
Pt given HD flu shot w/o any complications.

## 2021-07-18 ENCOUNTER — Telehealth: Payer: Self-pay | Admitting: *Deleted

## 2021-07-18 DIAGNOSIS — G4733 Obstructive sleep apnea (adult) (pediatric): Secondary | ICD-10-CM

## 2021-07-18 NOTE — Telephone Encounter (Signed)
-----   Message from Antonieta Iba, RN sent at 07/11/2021  9:23 AM EST ----- The patient is overdue for a new CPAP machine. She says that she does not know if she wants to get it now or wait. Dr. Radford Pax has ordered a new CPAP with heated humidity at 7cm H2O.  Thanks!

## 2021-07-18 NOTE — Telephone Encounter (Signed)
Pt is aware and agreeable to having her cpap order placed in her chart until she is ready to order it. She will call us at that time.

## 2021-07-24 ENCOUNTER — Telehealth (HOSPITAL_COMMUNITY): Payer: Self-pay | Admitting: Vascular Surgery

## 2021-07-24 NOTE — Telephone Encounter (Signed)
Returned pt call to confirm appt

## 2021-07-25 ENCOUNTER — Ambulatory Visit (HOSPITAL_COMMUNITY)
Admission: RE | Admit: 2021-07-25 | Discharge: 2021-07-25 | Disposition: A | Discharge status: Home / Self Care | Payer: Medicare Other | Source: Ambulatory Visit | Attending: Cardiology | Admitting: Cardiology

## 2021-07-25 ENCOUNTER — Other Ambulatory Visit: Payer: Self-pay

## 2021-07-25 ENCOUNTER — Encounter (HOSPITAL_COMMUNITY): Payer: Self-pay | Admitting: Cardiology

## 2021-07-25 VITALS — BP 120/80 | HR 65 | Wt 155.0 lb

## 2021-07-25 DIAGNOSIS — Z8616 Personal history of COVID-19: Secondary | ICD-10-CM | POA: Diagnosis not present

## 2021-07-25 DIAGNOSIS — Z8572 Personal history of non-Hodgkin lymphomas: Secondary | ICD-10-CM | POA: Diagnosis not present

## 2021-07-25 DIAGNOSIS — I10 Essential (primary) hypertension: Secondary | ICD-10-CM | POA: Diagnosis not present

## 2021-07-25 DIAGNOSIS — Z7901 Long term (current) use of anticoagulants: Secondary | ICD-10-CM | POA: Insufficient documentation

## 2021-07-25 DIAGNOSIS — Z9989 Dependence on other enabling machines and devices: Secondary | ICD-10-CM | POA: Diagnosis not present

## 2021-07-25 DIAGNOSIS — I480 Paroxysmal atrial fibrillation: Secondary | ICD-10-CM | POA: Diagnosis not present

## 2021-07-25 DIAGNOSIS — E785 Hyperlipidemia, unspecified: Secondary | ICD-10-CM | POA: Diagnosis not present

## 2021-07-25 DIAGNOSIS — I5022 Chronic systolic (congestive) heart failure: Secondary | ICD-10-CM

## 2021-07-25 DIAGNOSIS — E7800 Pure hypercholesterolemia, unspecified: Secondary | ICD-10-CM | POA: Diagnosis not present

## 2021-07-25 DIAGNOSIS — Z79899 Other long term (current) drug therapy: Secondary | ICD-10-CM | POA: Insufficient documentation

## 2021-07-25 DIAGNOSIS — G4733 Obstructive sleep apnea (adult) (pediatric): Secondary | ICD-10-CM | POA: Diagnosis not present

## 2021-07-25 LAB — CBC
HCT: 39.8 % (ref 36.0–46.0)
Hemoglobin: 13.2 g/dL (ref 12.0–15.0)
MCH: 31.2 pg (ref 26.0–34.0)
MCHC: 33.2 g/dL (ref 30.0–36.0)
MCV: 94.1 fL (ref 80.0–100.0)
Platelets: 223 10*3/uL (ref 150–400)
RBC: 4.23 MIL/uL (ref 3.87–5.11)
RDW: 12.3 % (ref 11.5–15.5)
WBC: 5.5 10*3/uL (ref 4.0–10.5)
nRBC: 0 % (ref 0.0–0.2)

## 2021-07-25 LAB — LIPID PANEL
Cholesterol: 145 mg/dL (ref 0–200)
HDL: 47 mg/dL (ref >40)
LDL Cholesterol: 85 mg/dL (ref 0–99)
Total CHOL/HDL Ratio: 3.1 RATIO
Triglycerides: 65 mg/dL (ref <150)
VLDL: 13 mg/dL (ref 0–40)

## 2021-07-25 LAB — BASIC METABOLIC PANEL
Anion gap: 6 (ref 5–15)
BUN: 13 mg/dL (ref 8–23)
CO2: 31 mmol/L (ref 22–32)
Calcium: 9.4 mg/dL (ref 8.9–10.3)
Chloride: 102 mmol/L (ref 98–111)
Creatinine, Ser: 0.69 mg/dL (ref 0.44–1.00)
GFR, Estimated: >60 mL/min (ref >60)
Glucose, Bld: 98 mg/dL (ref 70–99)
Potassium: 4 mmol/L (ref 3.5–5.1)
Sodium: 139 mmol/L (ref 135–145)

## 2021-07-25 LAB — TSH: TSH: 3.284 u[IU]/mL (ref 0.350–4.500)

## 2021-07-25 NOTE — Progress Notes (Signed)
Patient ID: Colleen White, female   DOB: Jul 02, 1946, 75 y.o.   MRN: [id number] PCP: Dr. Ronnald Ramp Cardiology: Dr. Aundra Dubin  75 y.o. with history of HTN, hyperlipidemia, paroxysmal atrial fibrillation and prior non-Hodgkins lymphoma presents for followup of atrial fibrillation.  She called the office in 1/17 to report increased episodes of palpitations.  She had had rare tachypalpitations for years, but in 1/17 she had 2 episodes of tachypalpitations close together.  I had her wear an event monitor.  By monitor, her tachypalpitations correlated with atrial fibrillation with RVR.  She was started on Xarelto and Toprol XL.  Toprol XL caused swelling, so it was stopped and diltiazem CD was begun.  Echo in 3/17 showed EF 65-70% with mildly elevated LVOT velocity due to vigorous contraction.  She is doing well today.  No palpitations.  She is in NSR.  She had COVID-19 in 9/22, took a while to recover fully.  She is still more fatigued than pre-COVID and continues to have an occasional dry cough.  No chest pain.  No exertional dyspnea.  No BRBPR/melena.   She uses CPAP regularly.   ECG: NSR, normal (personally reviewed)  Labs (4/15): K 3.7, creatinine 0.6, LDL 111, HDL 46 Labs (4/16): LDL 89 Labs (3/17): K 3.8, creatinine 0.7, HCT 37.3 Labs (4/17): LDL 93, HDL 46 Labs (8/17): hgb 13.1, K 3.4, creatinine 0.7 Labs (4/18): LDL 99, hgb 13.7 Labs (10/18): K 3.7, creatinine 0.75 Labs (6/19): LDL 97 Labs (8/19): K 3.9, creatinine 0.84 Labs (8/20): K 3.9, creatinine 0.8, LDL 88 Labs (3/21): LDL 73 Labs (6/21): K 3.4, creatinine 0.73 Labs (11/21): LDL 87, K 3.6, creatinine 0.68 Labs (12/21): hgb 12.9 Labs (9/22): K 4.1, creatinine 0.79  PMH: 1. Echo (11/15): EF 60-65%, trivial MR. Echo (3/17) with EF 65-70%, mildly elevated LVOT velocity due to vigorous contraction. 2. Chest pain: Myoview normal in 2/05. Stress echo (11/15) with no evidence for ischemia or infarction.  3. Suspected glaucoma 4. HTN 5.  Hyperlipidemia 6. GERD 7. Diverticulosis 8. TAH/BSO 9. Non-Hodgkins lymphoma: In remission.  Treated at [facility] with surgery and XRT.  Did not have chemotherapy.  10. Pulmonary nodules: Presumed benign after observation.  11. Atrial fibrillation: Paroxysmal.  12. OSA: Uses CPAP.  54. COVID-19 9/22  SH: Married, cares for elderly mother, nonsmoker.  Lives in [place].  FH: Mother with atrial fibrillation and CAD.   ROS: All systems reviewed and negative except as per HPI.   Current Outpatient Medications  Medication Sig Dispense Refill   Ascorbic Acid (VITAMIN C PO) Take 500 mg by mouth daily.      brimonidine-timolol (COMBIGAN) 0.2-0.5 % ophthalmic solution Place 1 drop into both eyes every 12 (twelve) hours.      calcium citrate (CALCITRATE - DOSED IN MG ELEMENTAL CALCIUM) 950 (200 Ca) MG tablet Take 200 mg of elemental calcium by mouth 2 (two) times daily.     chlorpheniramine-HYDROcodone (TUSSIONEX PENNKINETIC ER) 10-8 MG/5ML SUER Take 5 mLs by mouth every 12 (twelve) hours as needed for cough. 115 mL 0   Cholecalciferol (VITAMIN D3) 1000 units CAPS Take 1,000 Units by mouth.      Coenzyme Q10 (CO Q 10) 100 MG CAPS Take 1 tablet by mouth daily.      dicyclomine (BENTYL) 10 MG capsule Take 10 mg by mouth as needed for spasms.     diltiazem (CARDIZEM CD) 120 MG 24 hr capsule TAKE ONE CAPSULE BY MOUTH DAILY 90 capsule 3   docusate sodium (COLACE) 100 MG  capsule Take 200 mg by mouth as needed.     esomeprazole (NEXIUM) 20 MG packet Take 20 mg by mouth at bedtime. Takes as needed.     gabapentin (NEURONTIN) 100 MG capsule Take 3 capsules (300 mg total) by mouth at bedtime. 270 capsule 1   loratadine (CLARITIN) 10 MG tablet Take by mouth daily as needed.      METAMUCIL FIBER PO Take by mouth. 1 tsp by mouth once a day     MINOXIDIL FOR MEN EX Apply topically. Takes as needed at night.     mirtazapine (REMERON) 15 MG tablet Take 7.5 mg by mouth as needed. Takes 1/4 tablet at bedtime for  itching of scalp     Multiple Vitamins-Minerals (CENTRUM SILVER PO) Take 1 tablet by mouth daily.     Multiple Vitamins-Minerals (PRESERVISION AREDS 2 PO) Take 1 tablet by mouth 2 (two) times daily.      potassium chloride (MICRO-K) 10 MEQ CR capsule Take 1 capsule (10 mEq total) by mouth with breakfast, with lunch, and with evening meal. 270 capsule 1   pravastatin (PRAVACHOL) 40 MG tablet Take 1 tablet (40 mg total) by mouth at bedtime. 90 tablet 1   Probiotic Product (PROBIOTIC DAILY PO) Take 1 tablet by mouth daily.     telmisartan-hydrochlorothiazide (MICARDIS HCT) 80-12.5 MG tablet TAKE 1 TABLET ONCE DAILY. 90 tablet 1   traMADol (ULTRAM) 50 MG tablet TAKE ONE TABLET EVERY 6 HOURS AS NEEDED. 65 tablet 3   tretinoin (RETIN-A) 0.05 % cream Once daily     XARELTO 20 MG TABS tablet TAKE 1 TABLET DAILY WITH SUPPER. 30 tablet 6   No current facility-administered medications for this encounter.    BP 120/80   Pulse 65   Wt 70.3 kg (155 lb)   SpO2 98%   BMI 27.46 kg/m  General: NAD Neck: No JVD, no thyromegaly or thyroid nodule.  Lungs: Clear to auscultation bilaterally with normal respiratory effort. CV: Nondisplaced PMI.  Heart regular S1/S2, no S3/S4, no murmur.  No peripheral edema.  No carotid bruit.  Normal pedal pulses.  Abdomen: Soft, nontender, no hepatosplenomegaly, no distention.  Skin: Intact without lesions or rashes.  Neurologic: Alert and oriented x 3.  Psych: Normal affect. Extremities: No clubbing or cyanosis.  HEENT: Normal.   Assessment/Plan: 1. HTN: BP controlled.  - Continue telmisartan/HCTZ and diltiazem CD. 2. Hyperlipidemia: Check lipids today.  3. Atrial fibrillation: Paroxysmal.  First noted in 1/17.  No symptomatic recurrences recently.   CHADSVASC = 3.  Best route for prevention will be to continue to control OSA and HTN.  - Continue diltiazem CD 120 daily.  - Continue Xarelto 20 daily. BMET and CBC today. - Continue CPAP 4. OSA: Using CPAP.    Followup in 1 year.   [name] 07/25/2021

## 2021-07-25 NOTE — Patient Instructions (Signed)
EKG done today.  Labs done today. We will contact you only if your labs are abnormal.  No medication changes were made. Please continue all current medications as prescribed.  Your physician recommends that you schedule a follow-up appointment in: 1 year. Please contact our office in October 2023 to schedule a November 2023 appointment.    If you have any questions or concerns before your next appointment please send [place] a message through [place] or call our office at [phone].    TO LEAVE A MESSAGE FOR THE NURSE SELECT OPTION 2, PLEASE LEAVE A MESSAGE INCLUDING: YOUR NAME DATE OF BIRTH CALL BACK NUMBER REASON FOR CALL**this is important as we prioritize the call backs  YOU WILL RECEIVE A CALL BACK THE SAME DAY AS LONG AS YOU CALL BEFORE 4:00 PM   Do the following things EVERYDAY: Weigh yourself in the morning before breakfast. Write it down and keep it in a log. Take your medicines as prescribed Eat low salt foods--Limit salt (sodium) to 2000 mg per day.  Stay as active as you can everyday Limit all fluids for the day to less than 2 liters   At the [facility], you and your health needs are our priority. As part of our continuing mission to provide you with exceptional heart care, we have created designated Provider Care Teams. These Care Teams include your primary Cardiologist (physician) and Advanced Practice Providers (APPs- Physician Assistants and Nurse Practitioners) who all work together to provide you with the care you need, when you need it.   You may see any of the following providers on your designated Care Team at your next follow up: Dr Glori Bickers Dr Haynes Kerns, NP Lyda Jester, [place] Audry Riles, PharmD   Please be sure to bring in all your medications bottles to every appointment.

## 2021-07-26 DIAGNOSIS — H40053 Ocular hypertension, bilateral: Secondary | ICD-10-CM | POA: Diagnosis not present

## 2021-07-27 ENCOUNTER — Ambulatory Visit
Admission: RE | Admit: 2021-07-27 | Discharge: 2021-07-27 | Disposition: A | Discharge status: Home / Self Care | Payer: Medicare Other | Source: Ambulatory Visit | Attending: Obstetrics & Gynecology | Admitting: Obstetrics & Gynecology

## 2021-07-27 DIAGNOSIS — Z780 Asymptomatic menopausal state: Secondary | ICD-10-CM | POA: Diagnosis not present

## 2021-07-27 DIAGNOSIS — M8580 Other specified disorders of bone density and structure, unspecified site: Secondary | ICD-10-CM

## 2021-07-27 DIAGNOSIS — M85852 Other specified disorders of bone density and structure, left thigh: Secondary | ICD-10-CM | POA: Diagnosis not present

## 2021-08-03 DIAGNOSIS — L299 Pruritus, unspecified: Secondary | ICD-10-CM | POA: Diagnosis not present

## 2021-08-03 DIAGNOSIS — R58 Hemorrhage, not elsewhere classified: Secondary | ICD-10-CM | POA: Diagnosis not present

## 2021-08-03 DIAGNOSIS — D229 Melanocytic nevi, unspecified: Secondary | ICD-10-CM | POA: Diagnosis not present

## 2021-08-03 DIAGNOSIS — J3489 Other specified disorders of nose and nasal sinuses: Secondary | ICD-10-CM | POA: Diagnosis not present

## 2021-08-03 DIAGNOSIS — L219 Seborrheic dermatitis, unspecified: Secondary | ICD-10-CM | POA: Diagnosis not present

## 2021-08-03 DIAGNOSIS — L814 Other melanin hyperpigmentation: Secondary | ICD-10-CM | POA: Diagnosis not present

## 2021-08-03 DIAGNOSIS — D1801 Hemangioma of skin and subcutaneous tissue: Secondary | ICD-10-CM | POA: Diagnosis not present

## 2021-08-03 DIAGNOSIS — L658 Other specified nonscarring hair loss: Secondary | ICD-10-CM | POA: Diagnosis not present

## 2021-08-03 DIAGNOSIS — R208 Other disturbances of skin sensation: Secondary | ICD-10-CM | POA: Diagnosis not present

## 2021-08-10 DIAGNOSIS — Z9189 Other specified personal risk factors, not elsewhere classified: Secondary | ICD-10-CM | POA: Diagnosis not present

## 2021-08-15 DIAGNOSIS — M5416 Radiculopathy, lumbar region: Secondary | ICD-10-CM | POA: Diagnosis not present

## 2021-08-17 ENCOUNTER — Other Ambulatory Visit: Payer: Self-pay | Admitting: Neurological Surgery

## 2021-08-17 DIAGNOSIS — Z6827 Body mass index (BMI) 27.0-27.9, adult: Secondary | ICD-10-CM | POA: Diagnosis not present

## 2021-08-17 DIAGNOSIS — Z9189 Other specified personal risk factors, not elsewhere classified: Secondary | ICD-10-CM | POA: Diagnosis not present

## 2021-08-17 DIAGNOSIS — M5416 Radiculopathy, lumbar region: Secondary | ICD-10-CM

## 2021-08-22 ENCOUNTER — Encounter: Payer: Self-pay | Admitting: Internal Medicine

## 2021-08-22 ENCOUNTER — Ambulatory Visit (INDEPENDENT_AMBULATORY_CARE_PROVIDER_SITE_OTHER): Payer: Medicare Other

## 2021-08-22 ENCOUNTER — Other Ambulatory Visit: Payer: Self-pay

## 2021-08-22 ENCOUNTER — Ambulatory Visit (INDEPENDENT_AMBULATORY_CARE_PROVIDER_SITE_OTHER): Payer: Medicare Other | Admitting: Internal Medicine

## 2021-08-22 VITALS — BP 136/86 | HR 66 | Temp 98.1°F | Ht 63.0 in | Wt 157.0 lb

## 2021-08-22 DIAGNOSIS — M4316 Spondylolisthesis, lumbar region: Secondary | ICD-10-CM | POA: Diagnosis not present

## 2021-08-22 DIAGNOSIS — M8588 Other specified disorders of bone density and structure, other site: Secondary | ICD-10-CM | POA: Diagnosis not present

## 2021-08-22 DIAGNOSIS — M545 Low back pain, unspecified: Secondary | ICD-10-CM | POA: Diagnosis not present

## 2021-08-22 DIAGNOSIS — M5136 Other intervertebral disc degeneration, lumbar region: Secondary | ICD-10-CM

## 2021-08-22 DIAGNOSIS — I10 Essential (primary) hypertension: Secondary | ICD-10-CM

## 2021-08-22 DIAGNOSIS — M5441 Lumbago with sciatica, right side: Secondary | ICD-10-CM | POA: Diagnosis not present

## 2021-08-22 DIAGNOSIS — M47816 Spondylosis without myelopathy or radiculopathy, lumbar region: Secondary | ICD-10-CM | POA: Diagnosis not present

## 2021-08-22 MED ORDER — TIZANIDINE HCL 4 MG PO TABS: 4.0000 mg | ORAL_TABLET | Freq: Three times a day (TID) | ORAL | 3 refills | Status: DC | PRN

## 2021-08-22 NOTE — Progress Notes (Signed)
Subjective:  Patient ID: Colleen White, female    DOB: 1945-09-19  Age: 75 y.o. MRN: [id number]  CC: Back Pain  This visit occurred during the SARS-CoV-2 public health emergency.  Safety protocols were in place, including screening questions prior to the visit, additional usage of staff PPE, and extensive cleaning of exam room while observing appropriate contact time as indicated for disinfecting solutions.    HPI Colleen White presents for f/up -  She recently saw a NS for worsening LBP and is scheduled for an MRI but complains that 5 days ago her LBP suddenly worsened with no injury or trauma. Over the last few months she has developed RLE numbness but has no worsening paresthesias over the last few days. She is getting some symptom relief with tramadol.  Outpatient Medications Prior to Visit  Medication Sig Dispense Refill   Ascorbic Acid (VITAMIN C PO) Take 500 mg by mouth daily.      brimonidine-timolol (COMBIGAN) 0.2-0.5 % ophthalmic solution Place 1 drop into both eyes every 12 (twelve) hours.      calcium citrate (CALCITRATE - DOSED IN MG ELEMENTAL CALCIUM) 950 (200 Ca) MG tablet Take 200 mg of elemental calcium by mouth 2 (two) times daily.     chlorpheniramine-HYDROcodone (TUSSIONEX PENNKINETIC ER) 10-8 MG/5ML SUER Take 5 mLs by mouth every 12 (twelve) hours as needed for cough. 115 mL 0   Cholecalciferol (VITAMIN D3) 1000 units CAPS Take 1,000 Units by mouth.      Coenzyme Q10 (CO Q 10) 100 MG CAPS Take 1 tablet by mouth daily.      dicyclomine (BENTYL) 10 MG capsule Take 10 mg by mouth as needed for spasms.     diltiazem (CARDIZEM CD) 120 MG 24 hr capsule TAKE ONE CAPSULE BY MOUTH DAILY 90 capsule 3   docusate sodium (COLACE) 100 MG capsule Take 200 mg by mouth as needed.     esomeprazole (NEXIUM) 20 MG packet Take 20 mg by mouth at bedtime. Takes as needed.     gabapentin (NEURONTIN) 100 MG capsule Take 3 capsules (300 mg total) by mouth at bedtime. 270 capsule 1    loratadine (CLARITIN) 10 MG tablet Take by mouth daily as needed.      METAMUCIL FIBER PO Take by mouth. 1 tsp by mouth once a day     MINOXIDIL FOR MEN EX Apply topically. Takes as needed at night.     mirtazapine (REMERON) 15 MG tablet Take 7.5 mg by mouth as needed. Takes 1/4 tablet at bedtime for itching of scalp     Multiple Vitamins-Minerals (CENTRUM SILVER PO) Take 1 tablet by mouth daily.     Multiple Vitamins-Minerals (PRESERVISION AREDS 2 PO) Take 1 tablet by mouth 2 (two) times daily.      potassium chloride (MICRO-K) 10 MEQ CR capsule Take 1 capsule (10 mEq total) by mouth with breakfast, with lunch, and with evening meal. 270 capsule 1   pravastatin (PRAVACHOL) 40 MG tablet Take 1 tablet (40 mg total) by mouth at bedtime. 90 tablet 1   Probiotic Product (PROBIOTIC DAILY PO) Take 1 tablet by mouth daily.     telmisartan-hydrochlorothiazide (MICARDIS HCT) 80-12.5 MG tablet TAKE 1 TABLET ONCE DAILY. 90 tablet 1   traMADol (ULTRAM) 50 MG tablet TAKE ONE TABLET EVERY 6 HOURS AS NEEDED. 65 tablet 3   tretinoin (RETIN-A) 0.05 % cream Once daily     XARELTO 20 MG TABS tablet TAKE 1 TABLET DAILY WITH SUPPER. 30 tablet 6  No facility-administered medications prior to visit.    ROS Review of Systems  Constitutional:  Negative for diaphoresis and fatigue.  HENT: Negative.    Eyes: Negative.   Respiratory:  Negative for cough, chest tightness, shortness of breath and wheezing.   Cardiovascular:  Negative for chest pain, palpitations and leg swelling.  Gastrointestinal:  Negative for abdominal pain, constipation, diarrhea, nausea and vomiting.  Endocrine: Negative.   Genitourinary: Negative.  Negative for difficulty urinating.  Musculoskeletal:  Positive for back pain. Negative for arthralgias.  Skin: Negative.   Neurological:  Positive for numbness. Negative for dizziness, weakness and light-headedness.  Hematological:  Negative for adenopathy. Does not bruise/bleed easily.   Psychiatric/Behavioral: Negative.     Objective:  BP 136/86 (BP Location: Left Arm, Patient Position: Sitting, Cuff Size: Large)    Pulse 66    Temp 98.1 F (36.7 C) (Oral)    Ht 5\' 3"  (1.6 m)    Wt 157 lb (71.2 kg)    SpO2 97%    BMI 27.81 kg/m   BP Readings from Last 3 Encounters:  08/22/21 136/86  07/25/21 120/80  07/11/21 136/74    Wt Readings from Last 3 Encounters:  08/22/21 157 lb (71.2 kg)  07/25/21 155 lb (70.3 kg)  07/11/21 154 lb 3.2 oz (69.9 kg)    Physical Exam Vitals reviewed.  Constitutional:      Appearance: She is not ill-appearing.  HENT:     Nose: Nose normal.     Mouth/Throat:     Mouth: Mucous membranes are moist.  Eyes:     General: No scleral icterus.    Conjunctiva/sclera: Conjunctivae normal.  Cardiovascular:     Rate and Rhythm: Normal rate and regular rhythm.     Heart sounds: No murmur heard. Pulmonary:     Effort: Pulmonary effort is normal.     Breath sounds: No stridor. No wheezing, rhonchi or rales.  Abdominal:     General: Abdomen is flat.     Palpations: There is no mass.     Tenderness: There is no abdominal tenderness. There is no guarding.     Hernia: No hernia is present.  Musculoskeletal:     Cervical back: Normal and neck supple.     Thoracic back: Normal.     Lumbar back: No swelling, edema, deformity, signs of trauma, tenderness or bony tenderness. Normal range of motion. Negative right straight leg raise test and negative left straight leg raise test.     Right lower leg: No edema.     Left lower leg: No edema.  Lymphadenopathy:     Cervical: No cervical adenopathy.  Skin:    General: Skin is warm.  Neurological:     General: No focal deficit present.     Mental Status: She is alert. Mental status is at baseline.  Psychiatric:        Mood and Affect: Mood normal.        Behavior: Behavior normal.    Lab Results  Component Value Date   WBC 5.5 07/25/2021   HGB 13.2 07/25/2021   HCT 39.8 07/25/2021   PLT 223  07/25/2021   GLUCOSE 98 07/25/2021   CHOL 145 07/25/2021   TRIG 65 07/25/2021   HDL 47 07/25/2021   LDLDIRECT 142.5 09/30/2012   LDLCALC 85 07/25/2021   ALT 20 03/06/2021   AST 19 03/06/2021   NA 139 07/25/2021   K 4.0 07/25/2021   CL 102 07/25/2021   CREATININE 0.69 07/25/2021  BUN 13 07/25/2021   CO2 31 07/25/2021   TSH 3.284 07/25/2021   INR 1.7 (H) 03/06/2021   HGBA1C 5.8 09/10/2018    DG BONE DENSITY (DXA)  Result Date: 07/27/2021 EXAM: DUAL X-RAY ABSORPTIOMETRY (DXA) FOR BONE MINERAL DENSITY IMPRESSION: Referring Physician:  VAISHALI MODY Your patient completed a bone mineral density test using GE Lunar iDXA system (analysis version: 16). Technologist: [place] PATIENT: Name: Colleen White, Colleen White Patient ID: [id number] Birth Date: 1945-12-10 Height: 62.5 in. Sex: Female Measured: 07/27/2021 Weight: 153.2 lbs. Indications: Advanced Age, Bilateral Ovariectomy (65.51), Caucasian, Estrogen Deficient, Gabapentin, Height Loss (781.91), History of Fracture (Adult) (V15.51), Hysterectomy, Multiple broken bones, Nexium, Non-Hodgkins Lymphoma, Postmenopausal, Secondary Osteoporosis Fractures: Fibula, Finger, Right Ankle Treatments: Calcium (E943.0), Vitamin D (E933.5) ASSESSMENT: The BMD measured at Femur Neck Left is 0.864 g/cm2 with a T-score of -1.3. This patient is considered osteopenic/low bone mass according to [facility]) criteria. The quality of the exam is good. L4 was excluded due to degenerative changes. Site Region Measured Date Measured Age YA BMD Significant CHANGE T-score DualFemur Neck Left  07[age]27/2021    [age]         -1.3    0.864 g/cm2 DualFemur Neck Left  01/07/2018    73.2         -1.0    0.892 g/cm2 AP Spine  L1-L3      07/27/2021    76.7         -0.7    1.083 g/cm2 AP Spine  L1-L3      01/07/2018    73.2         -0.8    1.087 g/cm2 DualFemur Total Mean 07/27/2021    76.7         -0.3    0.964 g/cm2 DualFemur Total Mean 01/07/2018    73.2         -0.2    0.980  g/cm2 World Health Organization [facility]) criteria for post-menopausal, Caucasian Women: Normal       T-score at or above -1 SD Osteopenia   T-score between -1 and -2.5 SD Osteoporosis T-score at or below -2.5 SD RECOMMENDATION: 1. All patients should optimize calcium and vitamin D intake. 2. Consider FDA-approved medical therapies in postmenopausal women and men aged 4 years and older, based on the following: a. A hip or vertebral (clinical or morphometric) fracture. b. T-score = -2.5 at the femoral neck or spine after appropriate evaluation to exclude secondary causes. c. Low bone mass (T-score between -1.0 and -2.5 at the femoral neck or spine) and a 10-year probability of a hip fracture = 3% or a 10-year probability of a major osteoporosis-related fracture = 20% based on the [place]-adapted WHO algorithm. d. Clinician judgment and/or patient preferences may indicate treatment for people with 10-year fracture probabilities above or below these levels. FOLLOW-UP: Patients with diagnosis of osteoporosis or at high risk for fracture should have regular bone mineral density tests.? Patients eligible for Medicare are allowed routine testing every 2 years.? The testing frequency can be increased to one year for patients who have rapidly progressing disease, are receiving or discontinuing medical therapy to restore bone mass, or have additional risk factors. I have reviewed this study and agree with the findings. [facility] FRAX* 10-year Probability of Fracture Based on femoral neck BMD: DualFemur (Left) Major Osteoporotic Fracture: 16.8% Hip Fracture:                2.9% Population:  [place] (Caucasian) Risk Factors: History of Fracture (Adult) (V15.51), Secondary Osteoporosis *FRAX is a Materials engineer of the [employer] for Metabolic Bone Disease, a World Pharmacologist (WHO) [facility]. ASSESSMENT: The probability of a major osteoporotic fracture is  16.8% within the next ten years. The probability of a hip fracture is 2.9% within the next ten years. Electronically Signed   By: Rolm Baptise M.D.   On: 07/27/2021 09:22   DG Lumbar Spine Complete  Result Date: 08/22/2021 CLINICAL DATA:  Low back pain for 4 days.  No known injury. EXAM: LUMBAR SPINE - COMPLETE 4+ VIEW COMPARISON:  None. FINDINGS: 5 nonrib bearing lumbar-type vertebral bodies. Vertebral body heights are maintained. No acute fracture. Generalized osteopenia. Grade 1 anterolisthesis of L4 on L5 secondary to facet disease. No spondylolysis. Degenerative disease with disc height loss at L1-2, L4-5 and L5-S1. Degenerative disease with disc height loss at T11-12 and T12-L1. Bilateral facet arthropathy at L3-4, L4-5 and L5-S1. Mild osteoarthritis of the left SI joint. Abdominal aortic atherosclerosis. IMPRESSION: 1. Lumbar spine spondylosis as described above. 2. No acute osseous injury of the lumbar spine. Electronically Signed   By: Kathreen Devoid M.D.   On: 08/22/2021 11:13     Assessment & Plan:   Colleen White was seen today for back pain.  Diagnoses and all orders for this visit:  Acute left-sided low back pain with right-sided sciatica- The Xray is negative for fracture. -     DG Lumbar Spine Complete; Future -     tiZANidine (ZANAFLEX) 4 MG tablet; Take 1 tablet (4 mg total) by mouth every 8 (eight) hours as needed for muscle spasms.  Essential hypertension- Her BP is well controlled.  DDD (degenerative disc disease), lumbar- Will add a MR to tramadol for pain relief. -     tiZANidine (ZANAFLEX) 4 MG tablet; Take 1 tablet (4 mg total) by mouth every 8 (eight) hours as needed for muscle spasms.   I am having Colleen White start on tiZANidine. I am also having her maintain her tretinoin, Multiple Vitamins-Minerals (PRESERVISION AREDS 2 PO), Probiotic Product (PROBIOTIC DAILY PO), brimonidine-timolol, loratadine, Vitamin D3, mirtazapine, docusate sodium, esomeprazole, Co Q 10,  Ascorbic Acid (VITAMIN C PO), calcium citrate, MINOXIDIL FOR MEN EX, dicyclomine, gabapentin, potassium chloride, Multiple Vitamins-Minerals (CENTRUM SILVER PO), METAMUCIL FIBER PO, traMADol, diltiazem, telmisartan-hydrochlorothiazide, Xarelto, pravastatin, and chlorpheniramine-HYDROcodone.  Meds ordered this encounter  Medications   tiZANidine (ZANAFLEX) 4 MG tablet    Sig: Take 1 tablet (4 mg total) by mouth every 8 (eight) hours as needed for muscle spasms.    Dispense:  90 tablet    Refill:  3     Follow-up: No follow-ups on file.  Scarlette Calico, MD

## 2021-08-23 ENCOUNTER — Other Ambulatory Visit: Payer: Self-pay | Admitting: Internal Medicine

## 2021-08-23 ENCOUNTER — Telehealth: Payer: Self-pay

## 2021-08-23 DIAGNOSIS — M159 Polyosteoarthritis, unspecified: Secondary | ICD-10-CM

## 2021-08-23 NOTE — Progress Notes (Signed)
Chronic Care Management Pharmacy Assistant   Name: Breonna Gafford  MRN: [id number] DOB: 04-18-1946  Reason for Encounter: Disease State - Hypertension  Appointment: Telephone October 31, 2021 @ 11 am  Recent office visits:  08/22/21 Ronnald Ramp (PCP) - Acute left side. Start Tizanidine Hcl 4 mg.  06/20/21 Ronnald Ramp (PCP) - Subacute cough. No med changes.  03/06/21 Ronnald Ramp (PCP) - Anemia, Hypertension. No med changes.  Recent consult visits:  08/17/21 Phineas Douglas (Oncology Surgeon) - At high risk for breast cancer.  08/15/21 Coralie Common [facility]) New Patient. Back pain. Radiculopathy.  08/03/21 [facility] (Dermatology) - Dysesthesia of scalp. Start Bactroban 2%, Remeron 15 mg, Neurontin 100 mg & Lidex 0.05%.  07/11/21 Turner (Cardiology) - OSA (obstructive sleep apnea), Essential hypertension. Continue Telmisartan-HCTZ 80-12.5 mg  05/24/21 Cable (Pain Med) - Video Visit. Covid-19. Start Hydrocod Polst-Chlorphen Polst 10-8mg , Molnupiravir 800 mg.  05/20/21 White ([facility]) - Fall.  04/30/21 Zackowski ([facility]) - Right ear pain. No med changes.   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  02/25/21 Raspet ([facility]) - Insect stings. Start Prednisone 40 mg.  01/20/21 Mody (GYN) - Encounter for routine gynecological examination.  01/02/21 [facility] (Dermatology) - Dysesthesia of scalp.   Hospital visits:  Medication Reconciliation was completed by comparing discharge summary, patients EMR and Pharmacy list, and upon discussion with patient.  Admitted to the hospital on 05/22/21 due to Syncope. Discharge date was 05/22/21. Discharged from [facility].    Medications that remain the same after Hospital Discharge:??  -All other medications will remain the same.    Medications: Outpatient Encounter Medications as of 08/23/2021  Medication Sig   Ascorbic Acid (VITAMIN C PO) Take 500 mg by mouth daily.    brimonidine-timolol  (COMBIGAN) 0.2-0.5 % ophthalmic solution Place 1 drop into both eyes every 12 (twelve) hours.    calcium citrate (CALCITRATE - DOSED IN MG ELEMENTAL CALCIUM) 950 (200 Ca) MG tablet Take 200 mg of elemental calcium by mouth 2 (two) times daily.   chlorpheniramine-HYDROcodone (TUSSIONEX PENNKINETIC ER) 10-8 MG/5ML SUER Take 5 mLs by mouth every 12 (twelve) hours as needed for cough.   Cholecalciferol (VITAMIN D3) 1000 units CAPS Take 1,000 Units by mouth.    Coenzyme Q10 (CO Q 10) 100 MG CAPS Take 1 tablet by mouth daily.    dicyclomine (BENTYL) 10 MG capsule Take 10 mg by mouth as needed for spasms.   diltiazem (CARDIZEM CD) 120 MG 24 hr capsule TAKE ONE CAPSULE BY MOUTH DAILY   docusate sodium (COLACE) 100 MG capsule Take 200 mg by mouth as needed.   esomeprazole (NEXIUM) 20 MG packet Take 20 mg by mouth at bedtime. Takes as needed.   gabapentin (NEURONTIN) 100 MG capsule Take 3 capsules (300 mg total) by mouth at bedtime.   loratadine (CLARITIN) 10 MG tablet Take by mouth daily as needed.    METAMUCIL FIBER PO Take by mouth. 1 tsp by mouth once a day   MINOXIDIL FOR MEN EX Apply topically. Takes as needed at night.   mirtazapine (REMERON) 15 MG tablet Take 7.5 mg by mouth as needed. Takes 1/4 tablet at bedtime for itching of scalp   Multiple Vitamins-Minerals (CENTRUM SILVER PO) Take 1 tablet by mouth daily.   Multiple Vitamins-Minerals (PRESERVISION AREDS 2 PO) Take 1 tablet by mouth 2 (two) times daily.    potassium chloride (MICRO-K) 10 MEQ CR capsule Take 1 capsule (10 mEq total) by mouth with breakfast, with lunch, and with  evening meal.   pravastatin (PRAVACHOL) 40 MG tablet Take 1 tablet (40 mg total) by mouth at bedtime.   Probiotic Product (PROBIOTIC DAILY PO) Take 1 tablet by mouth daily.   telmisartan-hydrochlorothiazide (MICARDIS HCT) 80-12.5 MG tablet TAKE 1 TABLET ONCE DAILY.   tiZANidine (ZANAFLEX) 4 MG tablet Take 1 tablet (4 mg total) by mouth every 8 (eight) hours as needed for  muscle spasms.   traMADol (ULTRAM) 50 MG tablet TAKE ONE TABLET EVERY 6 HOURS AS NEEDED.   tretinoin (RETIN-A) 0.05 % cream Once daily   XARELTO 20 MG TABS tablet TAKE 1 TABLET DAILY WITH SUPPER.   No facility-administered encounter medications on file as of 08/23/2021.   Reviewed chart prior to disease state call. Spoke with patient regarding BP  Recent Office Vitals: BP Readings from Last 3 Encounters:  08/22/21 136/86  07/25/21 120/80  07/11/21 136/74   Pulse Readings from Last 3 Encounters:  08/22/21 66  07/25/21 65  07/11/21 82    Wt Readings from Last 3 Encounters:  08/22/21 157 lb (71.2 kg)  07/25/21 155 lb (70.3 kg)  07/11/21 154 lb 3.2 oz (69.9 kg)     Kidney Function Lab Results  Component Value Date/Time   CREATININE 0.69 07/25/2021 10:03 AM   CREATININE 0.79 05/22/2021 12:49 PM   CREATININE 0.70 10/27/2015 09:58 AM   GFR 71.60 03/06/2021 10:36 AM   GFRNONAA >60 07/25/2021 10:03 AM   GFRAA >60 11/11/2019 06:17 PM    BMP Latest Ref Rng & Units 07/25/2021 05/22/2021 03/06/2021  Glucose 70 - 99 mg/dL 98 96 95  BUN 8 - 23 mg/dL 13 14 10   Creatinine 0.44 - 1.00 mg/dL 0.69 0.79 0.80  BUN/Creat Ratio 12 - 28 - - -  Sodium 135 - 145 mmol/L 139 138 142  Potassium 3.5 - 5.1 mmol/L 4.0 4.1 3.9  Chloride 98 - 111 mmol/L 102 100 102  CO2 22 - 32 mmol/L 31 30 34(H)  Calcium 8.9 - 10.3 mg/dL 9.4 9.3 9.6    Current antihypertensive regimen:  diltiazem 120 mg daily,  telmisartan/HCTZ 80-12.5 mg daily,  potassium 10 mEq 3 times daily  Adherence Review: Is the patient currently on ACE/ARB medication? Yes Does the patient have >5 day gap between last estimated fill dates? No  Reviewed chart for medication changes and drug therapy problems ahead of medication adherence call.  Attempted to contact patient for medication review and health check, unable to reach patient, left voicemails to return call.  Care Gaps Colonoscopy - NA Diabetic Foot Exam - NA Mammogram -  02/09/21 Ophthalmology - NA Dexa Scan - 07/27/21 Annual Well Visit - NA Micro albumin - NA Hemoglobin A1c - NA   Star Rating Drugs: Pravastatin - 06/14/21 90D Telmisartan-hctz - 08/08/21 [place] Clinical Pharmacists Assistant [phone]

## 2021-08-28 ENCOUNTER — Other Ambulatory Visit: Payer: Self-pay | Admitting: Internal Medicine

## 2021-08-28 DIAGNOSIS — E876 Hypokalemia: Secondary | ICD-10-CM

## 2021-09-04 ENCOUNTER — Ambulatory Visit
Admission: RE | Admit: 2021-09-04 | Discharge: 2021-09-04 | Disposition: A | Discharge status: Home / Self Care | Payer: Medicare Other | Source: Ambulatory Visit | Attending: Neurological Surgery | Admitting: Neurological Surgery

## 2021-09-04 ENCOUNTER — Other Ambulatory Visit: Payer: Self-pay

## 2021-09-04 DIAGNOSIS — M47816 Spondylosis without myelopathy or radiculopathy, lumbar region: Secondary | ICD-10-CM | POA: Diagnosis not present

## 2021-09-04 DIAGNOSIS — M5416 Radiculopathy, lumbar region: Secondary | ICD-10-CM

## 2021-09-04 DIAGNOSIS — M48061 Spinal stenosis, lumbar region without neurogenic claudication: Secondary | ICD-10-CM | POA: Diagnosis not present

## 2021-09-04 DIAGNOSIS — M4316 Spondylolisthesis, lumbar region: Secondary | ICD-10-CM | POA: Diagnosis not present

## 2021-09-04 DIAGNOSIS — M545 Low back pain, unspecified: Secondary | ICD-10-CM | POA: Diagnosis not present

## 2021-09-04 DIAGNOSIS — R200 Anesthesia of skin: Secondary | ICD-10-CM | POA: Diagnosis not present

## 2021-09-19 DIAGNOSIS — M5416 Radiculopathy, lumbar region: Secondary | ICD-10-CM | POA: Diagnosis not present

## 2021-09-19 DIAGNOSIS — M4316 Spondylolisthesis, lumbar region: Secondary | ICD-10-CM | POA: Diagnosis not present

## 2021-09-20 ENCOUNTER — Ambulatory Visit: Payer: Medicare Other | Admitting: Adult Health

## 2021-09-22 DIAGNOSIS — M5451 Vertebrogenic low back pain: Secondary | ICD-10-CM | POA: Diagnosis not present

## 2021-09-22 DIAGNOSIS — M5416 Radiculopathy, lumbar region: Secondary | ICD-10-CM | POA: Diagnosis not present

## 2021-09-25 DIAGNOSIS — M25552 Pain in left hip: Secondary | ICD-10-CM | POA: Insufficient documentation

## 2021-09-25 DIAGNOSIS — M25562 Pain in left knee: Secondary | ICD-10-CM | POA: Diagnosis not present

## 2021-09-25 DIAGNOSIS — M25561 Pain in right knee: Secondary | ICD-10-CM | POA: Insufficient documentation

## 2021-09-28 DIAGNOSIS — M25562 Pain in left knee: Secondary | ICD-10-CM | POA: Diagnosis not present

## 2021-09-28 DIAGNOSIS — M79661 Pain in right lower leg: Secondary | ICD-10-CM | POA: Diagnosis not present

## 2021-09-28 DIAGNOSIS — M79604 Pain in right leg: Secondary | ICD-10-CM | POA: Diagnosis not present

## 2021-09-28 DIAGNOSIS — M79605 Pain in left leg: Secondary | ICD-10-CM | POA: Diagnosis not present

## 2021-09-28 DIAGNOSIS — M79662 Pain in left lower leg: Secondary | ICD-10-CM | POA: Diagnosis not present

## 2021-10-09 DIAGNOSIS — M25562 Pain in left knee: Secondary | ICD-10-CM | POA: Diagnosis not present

## 2021-10-09 DIAGNOSIS — M79605 Pain in left leg: Secondary | ICD-10-CM | POA: Diagnosis not present

## 2021-10-10 ENCOUNTER — Telehealth: Payer: Self-pay

## 2021-10-10 NOTE — Chronic Care Management (AMB) (Signed)
Chronic Care Management Pharmacy Assistant   Name: Jashiya Bassett  MRN: [id number] DOB: 1946/04/20  Colleen White is an 76 y.o. year old female who presents for his follow-up CCM visit with the clinical pharmacist.  Reason for Encounter: Disease State   Conditions to be addressed/monitored: HTN   Recent office visits:  None ID  Recent consult visits:  None ID  Hospital visits:  None in previous 6 months  Medications: Outpatient Encounter Medications as of 10/10/2021  Medication Sig   Ascorbic Acid (VITAMIN C PO) Take 500 mg by mouth daily.    brimonidine-timolol (COMBIGAN) 0.2-0.5 % ophthalmic solution Place 1 drop into both eyes every 12 (twelve) hours.    calcium citrate (CALCITRATE - DOSED IN MG ELEMENTAL CALCIUM) 950 (200 Ca) MG tablet Take 200 mg of elemental calcium by mouth 2 (two) times daily.   chlorpheniramine-HYDROcodone (TUSSIONEX PENNKINETIC ER) 10-8 MG/5ML SUER Take 5 mLs by mouth every 12 (twelve) hours as needed for cough.   Cholecalciferol (VITAMIN D3) 1000 units CAPS Take 1,000 Units by mouth.    Coenzyme Q10 (CO Q 10) 100 MG CAPS Take 1 tablet by mouth daily.    dicyclomine (BENTYL) 10 MG capsule Take 10 mg by mouth as needed for spasms.   diltiazem (CARDIZEM CD) 120 MG 24 hr capsule TAKE ONE CAPSULE BY MOUTH DAILY   docusate sodium (COLACE) 100 MG capsule Take 200 mg by mouth as needed.   esomeprazole (NEXIUM) 20 MG packet Take 20 mg by mouth at bedtime. Takes as needed.   gabapentin (NEURONTIN) 100 MG capsule Take 3 capsules (300 mg total) by mouth at bedtime.   loratadine (CLARITIN) 10 MG tablet Take by mouth daily as needed.    METAMUCIL FIBER PO Take by mouth. 1 tsp by mouth once a day   MINOXIDIL FOR MEN EX Apply topically. Takes as needed at night.   mirtazapine (REMERON) 15 MG tablet Take 7.5 mg by mouth as needed. Takes 1/4 tablet at bedtime for itching of scalp   Multiple Vitamins-Minerals (CENTRUM SILVER PO) Take 1 tablet by mouth daily.    Multiple Vitamins-Minerals (PRESERVISION AREDS 2 PO) Take 1 tablet by mouth 2 (two) times daily.    potassium chloride (MICRO-K) 10 MEQ CR capsule TAKE 1 CAPULE DAILY WITH BREAKFAST, LUNCH, AND EVENING MEAL.   pravastatin (PRAVACHOL) 40 MG tablet Take 1 tablet (40 mg total) by mouth at bedtime.   Probiotic Product (PROBIOTIC DAILY PO) Take 1 tablet by mouth daily.   telmisartan-hydrochlorothiazide (MICARDIS HCT) 80-12.5 MG tablet TAKE 1 TABLET ONCE DAILY.   tiZANidine (ZANAFLEX) 4 MG tablet Take 1 tablet (4 mg total) by mouth every 8 (eight) hours as needed for muscle spasms.   traMADol (ULTRAM) 50 MG tablet TAKE ONE TABLET EVERY 6 HOURS AS NEEDED.   tretinoin (RETIN-A) 0.05 % cream Once daily   XARELTO 20 MG TABS tablet TAKE 1 TABLET DAILY WITH SUPPER.   No facility-administered encounter medications on file as of 10/10/2021.   Reviewed chart prior to disease state call. Spoke with patient regarding BP  Recent Office Vitals: BP Readings from Last 3 Encounters:  08/22/21 136/86  07/25/21 120/80  07/11/21 136/74   Pulse Readings from Last 3 Encounters:  08/22/21 66  07/25/21 65  07/11/21 82    Wt Readings from Last 3 Encounters:  08/22/21 157 lb (71.2 kg)  07/25/21 155 lb (70.3 kg)  07/11/21 154 lb 3.2 oz (69.9 kg)     Kidney Function Lab Results  Component Value Date/Time   CREATININE 0.69 07/25/2021 10:03 AM   CREATININE 0.79 05/22/2021 12:49 PM   CREATININE 0.70 10/27/2015 09:58 AM   GFR 71.60 03/06/2021 10:36 AM   GFRNONAA >60 07/25/2021 10:03 AM   GFRAA >60 11/11/2019 06:17 PM    BMP Latest Ref Rng & Units 07/25/2021 05/22/2021 03/06/2021  Glucose 70 - 99 mg/dL 98 96 95  BUN 8 - 23 mg/dL 13 14 10   Creatinine 0.44 - 1.00 mg/dL 0.69 0.79 0.80  BUN/Creat Ratio 12 - 28 - - -  Sodium 135 - 145 mmol/L 139 138 142  Potassium 3.5 - 5.1 mmol/L 4.0 4.1 3.9  Chloride 98 - 111 mmol/L 102 100 102  CO2 22 - 32 mmol/L 31 30 34(H)  Calcium 8.9 - 10.3 mg/dL 9.4 9.3 9.6     Current antihypertensive regimen:  diltiazem 120 mg daily,  telmisartan/HCTZ 80-12.5 mg daily,   How often are you checking your Blood Pressure? 1-2x per week  Current home BP readings: Patient last home reading a  few days ago was 130/76  What recent interventions/DTPs have been made by any provider to improve Blood Pressure control since last CPP Visit: Blood pressure under control per Dr. Ronnald Ramp  Any recent hospitalizations or ED visits since last visit with CPP? No  What diet changes have been made to improve Blood Pressure Control?  Patient states that she is trying to eat as healthy as she can  What exercise is being done to improve your Blood Pressure Control?  Patient states that she walks in the house a lot since her accident  Adherence Review: Is the patient currently on ACE/ARB medication? Yes Does the patient have >5 day gap between last estimated fill dates? No   Care Gaps: Colonoscopy - NA Diabetic Foot Exam - NA Mammogram - 02/09/21 Ophthalmology - NA Dexa Scan - 07/27/21 Annual Well Visit - NA Micro albumin - NA Hemoglobin A1c - NA   Star Rating Drugs: Pravastatin - 09/28/21 90D Telmisartan-hctz - 08/08/21 [place] Pharmacist Assistant [phone]

## 2021-10-25 ENCOUNTER — Telehealth: Payer: Self-pay | Admitting: Internal Medicine

## 2021-10-25 NOTE — Telephone Encounter (Signed)
LVM for pt to rtn my call to schedule awv with nha. Please schedule awv if pt calls the office.  ?

## 2021-10-31 ENCOUNTER — Ambulatory Visit (INDEPENDENT_AMBULATORY_CARE_PROVIDER_SITE_OTHER): Payer: Medicare Other

## 2021-10-31 DIAGNOSIS — I10 Essential (primary) hypertension: Secondary | ICD-10-CM

## 2021-10-31 DIAGNOSIS — I480 Paroxysmal atrial fibrillation: Secondary | ICD-10-CM

## 2021-10-31 NOTE — Patient Instructions (Signed)
Visit Information ? ?Following are the goals we discussed today:  ? ?Manage My Medication  ? ?Timeframe:  Long-Range Goal ?Priority:  Medium ?Start Date:   10/31/2021                          ?Expected End Date:  11/01/2022                    ? ?Follow Up Date 07/03/2022 ?  ?- call for medicine refill 2 or 3 days before it runs out ?- call if I am sick and can't take my medicine ?- keep a list of all the medicines I take; vitamins and herbals too ?- learn to read medicine labels  ?  ?Why is this important?   ?These steps will help you keep on track with your medicines. ? ?Plan: Telephone follow up appointment with care management team member scheduled for:  8-12 months ?The patient has been provided with contact information for the care management team and has been advised to call with any health related questions or concerns.  ? ?Tomasa Blase, PharmD ?Clinical Pharmacist, [place]  ? ?Please call the care guide team at [phone] if you need to cancel or reschedule your appointment.  ? ?Patient verbalizes understanding of instructions and care plan provided today and agrees to view in [place]. Active MyChart status confirmed with patient.   ? ?
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
## 2021-11-15 ENCOUNTER — Ambulatory Visit (HOSPITAL_COMMUNITY)
Admission: RE | Admit: 2021-11-15 | Discharge: 2021-11-15 | Disposition: A | Discharge status: Home / Self Care | Payer: Medicare Other | Source: Ambulatory Visit | Attending: Cardiology | Admitting: Cardiology

## 2021-11-15 ENCOUNTER — Other Ambulatory Visit: Payer: Self-pay

## 2021-11-15 ENCOUNTER — Encounter (HOSPITAL_COMMUNITY): Payer: Self-pay | Admitting: Cardiology

## 2021-11-15 VITALS — BP 124/80 | HR 53 | Wt 155.0 lb

## 2021-11-15 DIAGNOSIS — G8929 Other chronic pain: Secondary | ICD-10-CM | POA: Insufficient documentation

## 2021-11-15 DIAGNOSIS — Z8572 Personal history of non-Hodgkin lymphomas: Secondary | ICD-10-CM | POA: Diagnosis not present

## 2021-11-15 DIAGNOSIS — M545 Low back pain, unspecified: Secondary | ICD-10-CM | POA: Diagnosis not present

## 2021-11-15 DIAGNOSIS — G4733 Obstructive sleep apnea (adult) (pediatric): Secondary | ICD-10-CM | POA: Diagnosis not present

## 2021-11-15 DIAGNOSIS — I5022 Chronic systolic (congestive) heart failure: Secondary | ICD-10-CM | POA: Diagnosis not present

## 2021-11-15 DIAGNOSIS — M4310 Spondylolisthesis, site unspecified: Secondary | ICD-10-CM | POA: Diagnosis not present

## 2021-11-15 DIAGNOSIS — E785 Hyperlipidemia, unspecified: Secondary | ICD-10-CM | POA: Insufficient documentation

## 2021-11-15 DIAGNOSIS — Z7901 Long term (current) use of anticoagulants: Secondary | ICD-10-CM | POA: Diagnosis not present

## 2021-11-15 DIAGNOSIS — I10 Essential (primary) hypertension: Secondary | ICD-10-CM | POA: Diagnosis not present

## 2021-11-15 DIAGNOSIS — I480 Paroxysmal atrial fibrillation: Secondary | ICD-10-CM | POA: Diagnosis not present

## 2021-11-15 DIAGNOSIS — Z79899 Other long term (current) drug therapy: Secondary | ICD-10-CM | POA: Insufficient documentation

## 2021-11-15 LAB — CBC
HCT: 41.9 % (ref 36.0–46.0)
Hemoglobin: 14.5 g/dL (ref 12.0–15.0)
MCH: 32.4 pg (ref 26.0–34.0)
MCHC: 34.6 g/dL (ref 30.0–36.0)
MCV: 93.5 fL (ref 80.0–100.0)
Platelets: 208 10*3/uL (ref 150–400)
RBC: 4.48 MIL/uL (ref 3.87–5.11)
RDW: 12.1 % (ref 11.5–15.5)
WBC: 5.7 10*3/uL (ref 4.0–10.5)
nRBC: 0 % (ref 0.0–0.2)

## 2021-11-15 LAB — BASIC METABOLIC PANEL
Anion gap: 8 (ref 5–15)
BUN: 8 mg/dL (ref 8–23)
CO2: 31 mmol/L (ref 22–32)
Calcium: 10 mg/dL (ref 8.9–10.3)
Chloride: 104 mmol/L (ref 98–111)
Creatinine, Ser: 0.75 mg/dL (ref 0.44–1.00)
GFR, Estimated: >60 mL/min (ref >60)
Glucose, Bld: 111 mg/dL — ABNORMAL HIGH (ref 70–99)
Potassium: 4 mmol/L (ref 3.5–5.1)
Sodium: 143 mmol/L (ref 135–145)

## 2021-11-15 NOTE — Progress Notes (Signed)
Patient ID: Colleen White, female   DOB: Sep 09, 1945, 76 y.o.   MRN: [id number] ?PCP: Dr. Ronnald Ramp ?Cardiology: Dr. Aundra Dubin ? ?76 y.o. with history of HTN, hyperlipidemia, paroxysmal atrial fibrillation and prior non-Hodgkins lymphoma presents for followup of atrial fibrillation.  She called the office in 1/17 to report increased episodes of palpitations.  She had had rare tachypalpitations for years, but in 1/17 she had 2 episodes of tachypalpitations close together.  I had her wear an event monitor.  By monitor, her tachypalpitations correlated with atrial fibrillation with RVR.  She was started on Xarelto and Toprol XL.  Toprol XL caused swelling, so it was stopped and diltiazem CD was begun.  Echo in 3/17 showed EF 65-70% with mildly elevated LVOT velocity due to vigorous contraction. ? ?She is doing well today.  She had palpitations 1 day last week and thought she might be in AF.  She took an extra diltiazem.  No exertional dyspnea or chest pain.  She is in NSR today.  Weight stable.  She has chronic low back pain related to spondylolisthesis and needs a back injection.  ? ?ECG: NSR, normal (personally reviewed) ? ?Labs (4/15): K 3.7, creatinine 0.6, LDL 111, HDL 46 ?Labs (4/16): LDL 89 ?Labs (3/17): K 3.8, creatinine 0.7, HCT 37.3 ?Labs (4/17): LDL 93, HDL 46 ?Labs (8/17): hgb 13.1, K 3.4, creatinine 0.7 ?Labs (4/18): LDL 99, hgb 13.7 ?Labs (10/18): K 3.7, creatinine 0.75 ?Labs (6/19): LDL 97 ?Labs (8/19): K 3.9, creatinine 0.84 ?Labs (8/20): K 3.9, creatinine 0.8, LDL 88 ?Labs (3/21): LDL 73 ?Labs (6/21): K 3.4, creatinine 0.73 ?Labs (11/21): LDL 87, K 3.6, creatinine 0.68 ?Labs (12/21): hgb 12.9 ?Labs (9/22): K 4.1, creatinine 0.79 ?Labs (11/22): LDL 85, K 4, creatinine 0.67, TSH normal, hgb 13.2 ? ?PMH: ?1. Echo (11/15): EF 60-65%, trivial MR. Echo (3/17) with EF 65-70%, mildly elevated LVOT velocity due to vigorous contraction. ?2. Chest pain: Myoview normal in 2/05. Stress echo (11/15) with no evidence for  ischemia or infarction.  ?3. Suspected glaucoma ?4. HTN ?5. Hyperlipidemia ?6. GERD ?7. Diverticulosis ?8. TAH/BSO ?9. Non-Hodgkins lymphoma: In remission.  [facility] with surgery and XRT.  Did not have chemotherapy.  ?10. Pulmonary nodules: Presumed benign after observation.  ?11. Atrial fibrillation: Paroxysmal.  ?12. OSA: Uses CPAP.  ?13. COVID-19 9/22 ?14. Spondylolisthesis ? ?SH: Married, cares for elderly mother, nonsmoker.  Lives in [place]. ? ?FH: Mother with atrial fibrillation and CAD.  ? ?ROS: All systems reviewed and negative except as per HPI.  ? ?Current Outpatient Medications  ?Medication Sig Dispense Refill  ? Ascorbic Acid (VITAMIN C PO) Take 500 mg by mouth daily.     ? brimonidine-timolol (COMBIGAN) 0.2-0.5 % ophthalmic solution Place 1 drop into both eyes every 12 (twelve) hours.     ? calcium citrate (CALCITRATE - DOSED IN MG ELEMENTAL CALCIUM) 950 (200 Ca) MG tablet Take 200 mg of elemental calcium by mouth 2 (two) times daily.    ? Cholecalciferol (VITAMIN D3) 1000 units CAPS Take 1,000 Units by mouth.     ? Coenzyme Q10 (CO Q 10) 100 MG CAPS Take 1 tablet by mouth daily.     ? dicyclomine (BENTYL) 10 MG capsule Take 10 mg by mouth as needed for spasms.    ? diltiazem (CARDIZEM CD) 120 MG 24 hr capsule TAKE ONE CAPSULE BY MOUTH DAILY 90 capsule 3  ? docusate sodium (COLACE) 100 MG capsule Take 200 mg by mouth as needed.    ?  esomeprazole (NEXIUM) 20 MG packet Take 20 mg by mouth at bedtime. Takes as needed.    ? gabapentin (NEURONTIN) 100 MG capsule Take 3 capsules (300 mg total) by mouth at bedtime. 270 capsule 1  ? loratadine (CLARITIN) 10 MG tablet Take by mouth daily as needed.     ? METAMUCIL FIBER PO Take by mouth. 1 tsp by mouth once a day    ? MINOXIDIL FOR MEN EX Apply topically. Takes as needed at night.    ? mirtazapine (REMERON) 15 MG tablet Take 7.5 mg by mouth as needed. Takes 1/4 tablet at bedtime for itching of scalp    ? Multiple Vitamins-Minerals (CENTRUM SILVER PO)  Take 1 tablet by mouth daily.    ? Multiple Vitamins-Minerals (PRESERVISION AREDS 2 PO) Take 1 tablet by mouth 2 (two) times daily.     ? potassium chloride (MICRO-K) 10 MEQ CR capsule Take 10 mEq by mouth 2 (two) times daily.    ? pravastatin (PRAVACHOL) 40 MG tablet Take 1 tablet (40 mg total) by mouth at bedtime. 90 tablet 1  ? Probiotic Product (PROBIOTIC DAILY PO) Take 1 tablet by mouth daily.    ? telmisartan-hydrochlorothiazide (MICARDIS HCT) 80-12.5 MG tablet Take 1 tablet by mouth daily.    ? tiZANidine (ZANAFLEX) 4 MG tablet Take 1 tablet (4 mg total) by mouth every 8 (eight) hours as needed for muscle spasms. 90 tablet 3  ? traMADol (ULTRAM) 50 MG tablet TAKE ONE TABLET EVERY 6 HOURS AS NEEDED. 65 tablet 3  ? tretinoin (RETIN-A) 0.05 % cream Once daily    ? XARELTO 20 MG TABS tablet TAKE 1 TABLET DAILY WITH SUPPER. 30 tablet 6  ? ?No current facility-administered medications for this encounter.  ? ? ?BP 124/80   Pulse (!) 53   Wt 70.3 kg (155 lb)   SpO2 99%   BMI 27.46 kg/m?  ?General: NAD ?Neck: No JVD, no thyromegaly or thyroid nodule.  ?Lungs: Clear to auscultation bilaterally with normal respiratory effort. ?CV: Nondisplaced PMI.  Heart regular S1/S2, no S3/S4, no murmur.  No peripheral edema.  No carotid bruit.  Normal pedal pulses.  ?Abdomen: Soft, nontender, no hepatosplenomegaly, no distention.  ?Skin: Intact without lesions or rashes.  ?Neurologic: Alert and oriented x 3.  ?Psych: Normal affect. ?Extremities: No clubbing or cyanosis.  ?HEENT: Normal.  ? ?Assessment/Plan: ?1. HTN: BP controlled.  ?- Continue telmisartan/HCTZ and diltiazem CD. ?2. Hyperlipidemia: Lipids ok in 11/22.   ?3. Atrial fibrillation: Paroxysmal.  First noted in 1/17.  Rare palpitations.   CHADSVASC = 3.  Best route for prevention will be to continue to control OSA and HTN.  ?- Continue diltiazem CD 120 daily.  ?- Continue Xarelto 20 daily. BMET and CBC today. ?- Continue CPAP ?4. OSA: Using CPAP.  ?5. OK to hold  Xarelto for 3 days prior to back injection then restart.  ? ?Followup in 6 months.   ? ?[name] ?11/15/2021 ? ? ? ?

## 2021-11-15 NOTE — Patient Instructions (Addendum)
There has been no changes to your medications. ? ?Labs done today, your results will be available in MyChart, we will contact you for abnormal readings. ? ?Your physician recommends that you schedule a follow-up appointment in: December 2023 ** please call the office in October to arrange your appointment** ? ?If you have any questions or concerns before your next appointment please send [place] a message through [place] or call our office at [phone].   ? ?TO LEAVE A MESSAGE FOR THE NURSE SELECT OPTION 2, PLEASE LEAVE A MESSAGE INCLUDING: ?YOUR NAME ?DATE OF BIRTH ?CALL BACK NUMBER ?REASON FOR CALL**this is important as we prioritize the call backs ? ?YOU WILL RECEIVE A CALL BACK THE SAME DAY AS LONG AS YOU CALL BEFORE 4:00 PM ? ?At the [facility], you and your health needs are our priority. As part of our continuing mission to provide you with exceptional heart care, we have created designated Provider Care Teams. These Care Teams include your primary Cardiologist (physician) and Advanced Practice Providers (APPs- Physician Assistants and Nurse Practitioners) who all work together to provide you with the care you need, when you need it.  ? ?You may see any of the following providers on your designated Care Team at your next follow up: ?Dr Glori Bickers ?Dr [name] ?Darrick Grinder, NP ?Lyda Jester, PA ?Jessica Milford,NP ?Marlyce Huge, PA ?Audry Riles, PharmD ? ? ?Please be sure to bring in all your medications bottles to every appointment.  ? ? ?

## 2021-11-17 ENCOUNTER — Ambulatory Visit: Payer: Medicare Other

## 2021-11-24 DIAGNOSIS — E785 Hyperlipidemia, unspecified: Secondary | ICD-10-CM | POA: Diagnosis not present

## 2021-11-24 DIAGNOSIS — I10 Essential (primary) hypertension: Secondary | ICD-10-CM | POA: Diagnosis not present

## 2021-11-24 DIAGNOSIS — I480 Paroxysmal atrial fibrillation: Secondary | ICD-10-CM | POA: Diagnosis not present

## 2021-11-27 ENCOUNTER — Ambulatory Visit (INDEPENDENT_AMBULATORY_CARE_PROVIDER_SITE_OTHER): Payer: Medicare Other

## 2021-11-27 DIAGNOSIS — Z0000 Encounter for general adult medical examination without abnormal findings: Secondary | ICD-10-CM | POA: Diagnosis not present

## 2021-11-27 NOTE — Progress Notes (Signed)
? ?Subjective:  ? Colleen White is a 76 y.o. female who presents for Medicare Annual (Subsequent) preventive examination. ?I connected with [name] today by telephone and verified that I am speaking with the correct person using two identifiers. ?Location patient: home ?Location provider: work ?Persons participating in the virtual visit: patient, provider. ?  ?I discussed the limitations, risks, security and privacy concerns of performing an evaluation and management service by telephone and the availability of in person appointments. I also discussed with the patient that there may be a patient responsible charge related to this service. The patient expressed understanding and verbally consented to this telephonic visit.  ?  ?Interactive audio and video telecommunications were attempted between this provider and patient, however failed, due to patient having technical difficulties OR patient did not have access to video capability.  We continued and completed visit with audio only. ? ?  ? ?Review of Systems    ? ?Cardiac Risk Factors include: advanced age (>68mn, >>53women) ? ?   ?Objective:  ?  ?Today's Vitals  ? ?There is no height or weight on file to calculate BMI. ? ? ?  11/27/2021  ?  8:15 AM 04/30/2021  ?  1:51 PM 11/08/2020  ? 10:44 AM 11/11/2019  ?  6:06 PM 10/23/2018  ? 10:27 AM 09/24/2018  ? 10:15 AM 01/18/2018  ?  1:30 PM  ?Advanced Directives  ?Does Patient Have a Medical Advance Directive? Yes Yes Yes No No Yes Yes  ?Type of [place]  Living will;Healthcare Power of [place]  ?Does patient want to make changes to medical advance directive?   No - Patient declined      ?Copy of [place]? No - copy requested  No - copy requested   No - copy requested No - copy requested  ?Would patient like information on creating a medical advance  directive?    No - Patient declined No - Patient declined    ? ? ?Current Medications (verified) ?Outpatient Encounter Medications as of 11/27/2021  ?Medication Sig  ? Ascorbic Acid (VITAMIN C PO) Take 500 mg by mouth daily.   ? brimonidine-timolol (COMBIGAN) 0.2-0.5 % ophthalmic solution Place 1 drop into both eyes every 12 (twelve) hours.   ? calcium citrate (CALCITRATE - DOSED IN MG ELEMENTAL CALCIUM) 950 (200 Ca) MG tablet Take 200 mg of elemental calcium by mouth 2 (two) times daily.  ? Cholecalciferol (VITAMIN D3) 1000 units CAPS Take 1,000 Units by mouth.   ? Coenzyme Q10 (CO Q 10) 100 MG CAPS Take 1 tablet by mouth daily.   ? dicyclomine (BENTYL) 10 MG capsule Take 10 mg by mouth as needed for spasms.  ? diltiazem (CARDIZEM CD) 120 MG 24 hr capsule TAKE ONE CAPSULE BY MOUTH DAILY  ? docusate sodium (COLACE) 100 MG capsule Take 200 mg by mouth as needed.  ? esomeprazole (NEXIUM) 20 MG packet Take 20 mg by mouth at bedtime. Takes as needed.  ? gabapentin (NEURONTIN) 100 MG capsule Take 3 capsules (300 mg total) by mouth at bedtime.  ? loratadine (CLARITIN) 10 MG tablet Take by mouth daily as needed.   ? METAMUCIL FIBER PO Take by mouth. 1 tsp by mouth once a day  ? MINOXIDIL FOR MEN EX Apply topically. Takes as needed at night.  ? mirtazapine (REMERON) 15 MG tablet Take 7.5 mg by mouth as  needed. Takes 1/4 tablet at bedtime for itching of scalp  ? Multiple Vitamins-Minerals (CENTRUM SILVER PO) Take 1 tablet by mouth daily.  ? Multiple Vitamins-Minerals (PRESERVISION AREDS 2 PO) Take 1 tablet by mouth 2 (two) times daily.   ? potassium chloride (MICRO-K) 10 MEQ CR capsule Take 10 mEq by mouth 2 (two) times daily.  ? pravastatin (PRAVACHOL) 40 MG tablet Take 1 tablet (40 mg total) by mouth at bedtime.  ? Probiotic Product (PROBIOTIC DAILY PO) Take 1 tablet by mouth daily.  ? telmisartan-hydrochlorothiazide (MICARDIS HCT) 80-12.5 MG tablet Take 1 tablet by mouth daily.  ? tiZANidine (ZANAFLEX) 4 MG tablet Take 1  tablet (4 mg total) by mouth every 8 (eight) hours as needed for muscle spasms.  ? traMADol (ULTRAM) 50 MG tablet TAKE ONE TABLET EVERY 6 HOURS AS NEEDED.  ? tretinoin (RETIN-A) 0.05 % cream Once daily  ? XARELTO 20 MG TABS tablet TAKE 1 TABLET DAILY WITH SUPPER.  ? ?No facility-administered encounter medications on file as of 11/27/2021.  ? ? ?Allergies (verified) ?Ibuprofen  ? ?History: ?Past Medical History:  ?Diagnosis Date  ? Allergic rhinitis, cause unspecified   ? Alopecia   ? Anxiety   ? Colon polyps   ? Diverticulosis of colon (without mention of hemorrhage)   ? Fibrocystic breast disease   ? GERD (gastroesophageal reflux disease)   ? Glaucoma   ? Headache(784.0)   ? Hemorrhoids   ? History of echocardiogram   ? Echo 3/17:  Vigorous LVF, EF 65-70%, normal wall motion, grade 1 diastolic, mildly elevated LVOT velocity of 2.2 m/s-likely related to vigorous LVF  ? History of stress test   ? Myoview normal in 2/05. //  Stress echo (11/15) with no evidence for ischemia or infarction.  ? HLD (hyperlipidemia)   ? HTN (hypertension)   ? Hypertension   ? Mitral valve disorders(424.0)   ? Non Hodgkin's lymphoma ([place])   ? Treated at [facility] with surgery and XRT.  Did not have chemotherapy.   ? OSA (obstructive sleep apnea) 03/29/2016  ? Osteopenia   ? PAF (paroxysmal atrial fibrillation) ([place])   ? a. CHADS2-VASc=3 //  b. Xarelto started in 2017  ? Pulmonary nodule   ? Presumed benign after observation.   ? Sleep apnea   ? CPAP  ? Unspecified glaucoma(365.9)   ? Varicose vein of leg   ? ?Past Surgical History:  ?Procedure Laterality Date  ? ABDOMINAL HYSTERECTOMY  1996  ? BASAL CELL CARCINOMA EXCISION    ? 06/2017  ? BREAST CYST ASPIRATION  2017  ? dental implant  12/21/2019  ? #[id number]  ? EYE SURGERY  2009  ? Cataract  ? 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 4cm mesenteric mass, Dr. Cloyd Stagers, [facility]  ? s/p ORIF prox phalanx  02/2007  ? right little finger----Dr Fredna Dow  ? ?Family History  ?Problem Relation Age of Onset  ?  Hypertension Mother   ? Breast cancer Mother   ?     double mastectomy  ? Heart disease Mother   ? Diabetes Father   ? Heart disease Father   ? Prostate cancer Father   ? Esophageal cancer Father   ? Breast cancer Sister   ?     breast (sister), spread to brain  ? Colon cancer Maternal Uncle   ? ?Social History  ? ?Socioeconomic History  ? Marital status: Married  ?  Spouse name: Not on file  ? Number of children: 0  ? Years  of education: Not on file  ? Highest education level: Not on file  ?Occupational History  ? Not on file  ?Tobacco Use  ? Smoking status: Former  ?  Types: Cigarettes  ?  Quit date: 08/27/1974  ?  Years since quitting: 64.2  ? Smokeless tobacco: Never  ?Vaping Use  ? Vaping Use: Never used  ?Substance and Sexual Activity  ? Alcohol use: No  ? Drug use: No  ? Sexual activity: Yes  ?Other Topics Concern  ? Not on file  ?Social History Narrative  ? Lives at home with spouse  ? Retired  ? Caffeine: 3-4 cups/day  ? ?Social Determinants of Health  ? ?Financial Resource Strain: Low Risk   ? Difficulty of Paying Living Expenses: Not hard at all  ?Food Insecurity: No Food Insecurity  ? Worried About Charity fundraiser in the Last Year: Never true  ? Ran Out of Food in the Last Year: Never true  ?Transportation Needs: No Transportation Needs  ? Lack of Transportation (Medical): No  ? Lack of Transportation (Non-Medical): No  ?Physical Activity: Inactive  ? Days of Exercise per Week: 0 days  ? Minutes of Exercise per Session: 0 min  ?Stress: No Stress Concern Present  ? Feeling of Stress : Not at all  ?Social Connections: Moderately Integrated  ? Frequency of Communication with Friends and Family: Twice a week  ? Frequency of Social Gatherings with Friends and Family: Twice a week  ? Attends Religious Services: More than 4 times per year  ? Active Member of Clubs or Organizations: No  ? Attends Archivist Meetings: Never  ? Marital Status: Married  ? ? ?Tobacco Counseling ?Counseling given: Not  Answered ? ? ?Clinical Intake: ? ?Pre-visit preparation completed: Yes ? ?Pain : No/denies pain ? ?  ? ?Nutritional Risks: None ?Diabetes: No ? ?How often do you need to have someone help you when you read instructions, pa

## 2021-11-27 NOTE — Patient Instructions (Signed)
Ms. Gavina , ?Thank you for taking time to come for your Medicare Wellness Visit. I appreciate your ongoing commitment to your health goals. Please review the following plan we discussed and let me know if I can assist you in the future.  ? ?Screening recommendations/referrals: ?Colonoscopy: no longer required  ?Mammogram: no longer required  ?Bone Density: 07/27/2021 ?Recommended yearly ophthalmology/optometry visit for glaucoma screening and checkup ?Recommended yearly dental visit for hygiene and checkup ? ?Vaccinations: ?Influenza vaccine: completed  ?Pneumococcal vaccine: completed  ?Tdap vaccine: 09/30/2012 ?Shingles vaccine: completed    ? ?Advanced directives: yes  ? ?Conditions/risks identified: none  ? ?Next appointment: none  ? ? ?Preventive Care 32 Years and Older, Female ?Preventive care refers to lifestyle choices and visits with your health care provider that can promote health and wellness. ?What does preventive care include? ?A yearly physical exam. This is also called an annual well check. ?Dental exams once or twice a year. ?Routine eye exams. Ask your health care provider how often you should have your eyes checked. ?Personal lifestyle choices, including: ?Daily care of your teeth and gums. ?Regular physical activity. ?Eating a healthy diet. ?Avoiding tobacco and drug use. ?Limiting alcohol use. ?Practicing safe sex. ?Taking low-dose aspirin every day. ?Taking vitamin and mineral supplements as recommended by your health care provider. ?What happens during an annual well check? ?The services and screenings done by your health care provider during your annual well check will depend on your age, overall health, lifestyle risk factors, and family history of disease. ?Counseling  ?Your health care provider may ask you questions about your: ?Alcohol use. ?Tobacco use. ?Drug use. ?Emotional well-being. ?Home and relationship well-being. ?Sexual activity. ?Eating habits. ?History of falls. ?Memory and  ability to understand (cognition). ?Work and work Statistician. ?Reproductive health. ?Screening  ?You may have the following tests or measurements: ?Height, weight, and BMI. ?Blood pressure. ?Lipid and cholesterol levels. These may be checked every 5 years, or more frequently if you are over 73 years old. ?Skin check. ?Lung cancer screening. You may have this screening every year starting at age 78 if you have a 30-pack-year history of smoking and currently smoke or have quit within the past 15 years. ?Fecal occult blood test (FOBT) of the stool. You may have this test every year starting at age 72. ?Flexible sigmoidoscopy or colonoscopy. You may have a sigmoidoscopy every 5 years or a colonoscopy every 10 years starting at age 62. ?Hepatitis C blood test. ?Hepatitis B blood test. ?Sexually transmitted disease (STD) testing. ?Diabetes screening. This is done by checking your blood sugar (glucose) after you have not eaten for a while (fasting). You may have this done every 1-3 years. ?Bone density scan. This is done to screen for osteoporosis. You may have this done starting at age 32. ?Mammogram. This may be done every 1-2 years. Talk to your health care provider about how often you should have regular mammograms. ?Talk with your health care provider about your test results, treatment options, and if necessary, the need for more tests. ?Vaccines  ?Your health care provider may recommend certain vaccines, such as: ?Influenza vaccine. This is recommended every year. ?Tetanus, diphtheria, and acellular pertussis (Tdap, Td) vaccine. You may need a Td booster every 10 years. ?Zoster vaccine. You may need this after age 10. ?Pneumococcal 13-valent conjugate (PCV13) vaccine. One dose is recommended after age 69. ?Pneumococcal polysaccharide (PPSV23) vaccine. One dose is recommended after age 31. ?Talk to your health care provider about which screenings and vaccines  you need and how often you need them. ?This information is  not intended to replace advice given to you by your health care provider. Make sure you discuss any questions you have with your health care provider. ?Document Released: 09/09/2015 Document Revised: 05/02/2016 Document Reviewed: 06/14/2015 ?Elsevier Interactive Patient Education ? 2017 [place]. ? ?Fall Prevention in the Home ?Falls can cause injuries. They can happen to people of all ages. There are many things you can do to make your home safe and to help prevent falls. ?What can I do on the outside of my home? ?Regularly fix the edges of walkways and driveways and fix any cracks. ?Remove anything that might make you trip as you walk through a door, such as a raised step or threshold. ?Trim any bushes or trees on the path to your home. ?Use bright outdoor lighting. ?Clear any walking paths of anything that might make someone trip, such as rocks or tools. ?Regularly check to see if handrails are loose or broken. Make sure that both sides of any steps have handrails. ?Any raised decks and porches should have guardrails on the edges. ?Have any leaves, snow, or ice cleared regularly. ?Use sand or salt on walking paths during winter. ?Clean up any spills in your garage right away. This includes oil or grease spills. ?What can I do in the bathroom? ?Use night lights. ?Install grab bars by the toilet and in the tub and shower. Do not use towel bars as grab bars. ?Use non-skid mats or decals in the tub or shower. ?If you need to sit down in the shower, use a plastic, non-slip stool. ?Keep the floor dry. Clean up any water that spills on the floor as soon as it happens. ?Remove soap buildup in the tub or shower regularly. ?Attach bath mats securely with double-sided non-slip rug tape. ?Do not have throw rugs and other things on the floor that can make you trip. ?What can I do in the bedroom? ?Use night lights. ?Make sure that you have a light by your bed that is easy to reach. ?Do not use any sheets or blankets that  are too big for your bed. They should not hang down onto the floor. ?Have a firm chair that has side arms. You can use this for support while you get dressed. ?Do not have throw rugs and other things on the floor that can make you trip. ?What can I do in the kitchen? ?Clean up any spills right away. ?Avoid walking on wet floors. ?Keep items that you use a lot in easy-to-reach places. ?If you need to reach something above you, use a strong step stool that has a grab bar. ?Keep electrical cords out of the way. ?Do not use floor polish or wax that makes floors slippery. If you must use wax, use non-skid floor wax. ?Do not have throw rugs and other things on the floor that can make you trip. ?What can I do with my stairs? ?Do not leave any items on the stairs. ?Make sure that there are handrails on both sides of the stairs and use them. Fix handrails that are broken or loose. Make sure that handrails are as long as the stairways. ?Check any carpeting to make sure that it is firmly attached to the stairs. Fix any carpet that is loose or worn. ?Avoid having throw rugs at the top or bottom of the stairs. If you do have throw rugs, attach them to the floor with carpet tape. ?Make sure  that you have a light switch at the top of the stairs and the bottom of the stairs. If you do not have them, ask someone to add them for you. ?What else can I do to help prevent falls? ?Wear shoes that: ?Do not have high heels. ?Have rubber bottoms. ?Are comfortable and fit you well. ?Are closed at the toe. Do not wear sandals. ?If you use a stepladder: ?Make sure that it is fully opened. Do not climb a closed stepladder. ?Make sure that both sides of the stepladder are locked into place. ?Ask someone to hold it for you, if possible. ?Clearly mark and make sure that you can see: ?Any grab bars or handrails. ?First and last steps. ?Where the edge of each step is. ?Use tools that help you move around (mobility aids) if they are needed. These  include: ?Canes. ?Walkers. ?Scooters. ?Crutches. ?Turn on the lights when you go into a dark area. Replace any light bulbs as soon as they burn out. ?Set up your furniture so you have a clear path. Avoi

## 2021-12-07 ENCOUNTER — Ambulatory Visit: Payer: Medicare Other | Admitting: Internal Medicine

## 2021-12-11 ENCOUNTER — Other Ambulatory Visit (HOSPITAL_COMMUNITY): Payer: Self-pay | Admitting: Cardiology

## 2021-12-20 ENCOUNTER — Other Ambulatory Visit: Payer: Self-pay | Admitting: Internal Medicine

## 2021-12-20 DIAGNOSIS — E7800 Pure hypercholesterolemia, unspecified: Secondary | ICD-10-CM

## 2021-12-28 DIAGNOSIS — M25511 Pain in right shoulder: Secondary | ICD-10-CM | POA: Diagnosis not present

## 2021-12-28 DIAGNOSIS — M25512 Pain in left shoulder: Secondary | ICD-10-CM | POA: Diagnosis not present

## 2022-01-22 ENCOUNTER — Other Ambulatory Visit: Payer: Self-pay | Admitting: Internal Medicine

## 2022-01-22 DIAGNOSIS — M159 Polyosteoarthritis, unspecified: Secondary | ICD-10-CM

## 2022-01-24 DIAGNOSIS — H353131 Nonexudative age-related macular degeneration, bilateral, early dry stage: Secondary | ICD-10-CM | POA: Diagnosis not present

## 2022-01-24 DIAGNOSIS — H40013 Open angle with borderline findings, low risk, bilateral: Secondary | ICD-10-CM | POA: Diagnosis not present

## 2022-01-24 DIAGNOSIS — H52203 Unspecified astigmatism, bilateral: Secondary | ICD-10-CM | POA: Diagnosis not present

## 2022-01-24 DIAGNOSIS — H40053 Ocular hypertension, bilateral: Secondary | ICD-10-CM | POA: Diagnosis not present

## 2022-02-01 DIAGNOSIS — R208 Other disturbances of skin sensation: Secondary | ICD-10-CM | POA: Diagnosis not present

## 2022-02-01 DIAGNOSIS — D1801 Hemangioma of skin and subcutaneous tissue: Secondary | ICD-10-CM | POA: Diagnosis not present

## 2022-02-01 DIAGNOSIS — L299 Pruritus, unspecified: Secondary | ICD-10-CM | POA: Diagnosis not present

## 2022-02-01 DIAGNOSIS — L219 Seborrheic dermatitis, unspecified: Secondary | ICD-10-CM | POA: Diagnosis not present

## 2022-02-01 DIAGNOSIS — Z79899 Other long term (current) drug therapy: Secondary | ICD-10-CM | POA: Diagnosis not present

## 2022-02-01 DIAGNOSIS — I83813 Varicose veins of bilateral lower extremities with pain: Secondary | ICD-10-CM | POA: Diagnosis not present

## 2022-02-13 ENCOUNTER — Ambulatory Visit (INDEPENDENT_AMBULATORY_CARE_PROVIDER_SITE_OTHER): Payer: Medicare Other | Admitting: Internal Medicine

## 2022-02-13 ENCOUNTER — Encounter: Payer: Self-pay | Admitting: Internal Medicine

## 2022-02-13 VITALS — BP 116/70 | HR 57 | Temp 98.2°F | Ht 63.0 in | Wt 155.0 lb

## 2022-02-13 DIAGNOSIS — K219 Gastro-esophageal reflux disease without esophagitis: Secondary | ICD-10-CM

## 2022-02-13 DIAGNOSIS — E559 Vitamin D deficiency, unspecified: Secondary | ICD-10-CM

## 2022-02-13 DIAGNOSIS — Z23 Encounter for immunization: Secondary | ICD-10-CM | POA: Diagnosis not present

## 2022-02-13 DIAGNOSIS — D539 Nutritional anemia, unspecified: Secondary | ICD-10-CM

## 2022-02-13 DIAGNOSIS — I10 Essential (primary) hypertension: Secondary | ICD-10-CM | POA: Diagnosis not present

## 2022-02-13 DIAGNOSIS — E538 Deficiency of other specified B group vitamins: Secondary | ICD-10-CM

## 2022-02-13 DIAGNOSIS — K7581 Nonalcoholic steatohepatitis (NASH): Secondary | ICD-10-CM | POA: Diagnosis not present

## 2022-02-13 DIAGNOSIS — E7800 Pure hypercholesterolemia, unspecified: Secondary | ICD-10-CM

## 2022-02-13 DIAGNOSIS — I83811 Varicose veins of right lower extremities with pain: Secondary | ICD-10-CM

## 2022-02-13 LAB — VITAMIN D 25 HYDROXY (VIT D DEFICIENCY, FRACTURES): VITD: 57.96 ng/mL (ref 30.00–100.00)

## 2022-02-13 LAB — HEPATIC FUNCTION PANEL
ALT: 18 U/L (ref 0–35)
AST: 21 U/L (ref 0–37)
Albumin: 4.3 g/dL (ref 3.5–5.2)
Alkaline Phosphatase: 90 U/L (ref 39–117)
Bilirubin, Direct: 0.2 mg/dL (ref 0.0–0.3)
Total Bilirubin: 0.8 mg/dL (ref 0.2–1.2)
Total Protein: 7.2 g/dL (ref 6.0–8.3)

## 2022-02-13 LAB — LIPID PANEL
Cholesterol: 143 mg/dL (ref 0–200)
HDL: 50 mg/dL (ref >39.00)
LDL Cholesterol: 75 mg/dL (ref 0–99)
NonHDL: 92.83
Total CHOL/HDL Ratio: 3
Triglycerides: 91 mg/dL (ref 0.0–149.0)
VLDL: 18.2 mg/dL (ref 0.0–40.0)

## 2022-02-13 LAB — BASIC METABOLIC PANEL
BUN: 14 mg/dL (ref 6–23)
CO2: 33 mEq/L — ABNORMAL HIGH (ref 19–32)
Calcium: 10 mg/dL (ref 8.4–10.5)
Chloride: 99 mEq/L (ref 96–112)
Creatinine, Ser: 0.82 mg/dL (ref 0.40–1.20)
GFR: 69.05 mL/min (ref >60.00)
Glucose, Bld: 90 mg/dL (ref 70–99)
Potassium: 3.9 mEq/L (ref 3.5–5.1)
Sodium: 139 mEq/L (ref 135–145)

## 2022-02-13 LAB — FOLATE: Folate: >24.2 ng/mL (ref >5.9)

## 2022-02-13 LAB — VITAMIN B12: Vitamin B-12: 387 pg/mL (ref 211–911)

## 2022-02-13 LAB — TSH: TSH: 2.05 u[IU]/mL (ref 0.35–5.50)

## 2022-02-13 NOTE — Progress Notes (Signed)
Subjective:  Patient ID: Colleen White, female    DOB: Apr 01, 1946  Age: 76 y.o. MRN: [id number]  CC: Hypertension and Anemia   HPI Colleen White presents for f/up -  She requested labs.  She has varicose veins and would like to see a vascular surgeon.  She is active and denies chest pain, shortness of breath, diaphoresis, dizziness, lightheadedness, or edema.  Outpatient Medications Prior to Visit  Medication Sig Dispense Refill   Ascorbic Acid (VITAMIN C PO) Take 500 mg by mouth daily.      brimonidine-timolol (COMBIGAN) 0.2-0.5 % ophthalmic solution Place 1 drop into both eyes every 12 (twelve) hours.      calcium citrate (CALCITRATE - DOSED IN MG ELEMENTAL CALCIUM) 950 (200 Ca) MG tablet Take 200 mg of elemental calcium by mouth 2 (two) times daily.     Cholecalciferol (VITAMIN D3) 1000 units CAPS Take 1,000 Units by mouth.      Coenzyme Q10 (CO Q 10) 100 MG CAPS Take 1 tablet by mouth daily.      dicyclomine (BENTYL) 10 MG capsule Take 10 mg by mouth as needed for spasms.     diltiazem (CARDIZEM CD) 120 MG 24 hr capsule TAKE ONE CAPSULE BY MOUTH DAILY 90 capsule 3   docusate sodium (COLACE) 100 MG capsule Take 200 mg by mouth as needed.     esomeprazole (NEXIUM) 20 MG packet Take 20 mg by mouth at bedtime. Takes as needed.     gabapentin (NEURONTIN) 100 MG capsule Take 3 capsules (300 mg total) by mouth at bedtime. 270 capsule 1   loratadine (CLARITIN) 10 MG tablet Take by mouth daily as needed.      MINOXIDIL FOR MEN EX Apply topically. Takes as needed at night.     mirtazapine (REMERON) 15 MG tablet Take 7.5 mg by mouth as needed. Takes 1/4 tablet at bedtime for itching of scalp     Multiple Vitamins-Minerals (CENTRUM SILVER PO) Take 1 tablet by mouth daily.     Multiple Vitamins-Minerals (PRESERVISION AREDS 2 PO) Take 1 tablet by mouth 2 (two) times daily.      potassium chloride (MICRO-K) 10 MEQ CR capsule Take 10 mEq by mouth 2 (two) times daily.     pravastatin  (PRAVACHOL) 40 MG tablet TAKE ONE TABLET BY MOUTH AT BEDTIME 90 tablet 1   Probiotic Product (PROBIOTIC DAILY PO) Take 1 tablet by mouth daily.     telmisartan-hydrochlorothiazide (MICARDIS HCT) 80-12.5 MG tablet Take 1 tablet by mouth daily.     tiZANidine (ZANAFLEX) 4 MG tablet Take 1 tablet (4 mg total) by mouth every 8 (eight) hours as needed for muscle spasms. 90 tablet 3   traMADol (ULTRAM) 50 MG tablet TAKE ONE TABLET BY MOUTH EVERY 6 HOURS AS NEEDED 65 tablet 3   tretinoin (RETIN-A) 0.05 % cream Once daily     XARELTO 20 MG TABS tablet TAKE ONE TABLET BY MOUTH WITH SUPPER 30 tablet 6   METAMUCIL FIBER PO Take by mouth. 1 tsp by mouth once a day     No facility-administered medications prior to visit.    ROS Review of Systems  Constitutional:  Negative for chills, diaphoresis, fatigue and fever.  HENT: Negative.    Eyes: Negative.   Respiratory:  Negative for cough, chest tightness, shortness of breath and wheezing.   Cardiovascular:  Negative for chest pain, palpitations and leg swelling.  Gastrointestinal:  Negative for abdominal pain, diarrhea, nausea and vomiting.  Endocrine: Negative.  Genitourinary: Negative.  Negative for difficulty urinating.  Musculoskeletal: Negative.  Negative for myalgias.  Skin: Negative.   Neurological:  Negative for dizziness, syncope, weakness and light-headedness.  Hematological:  Negative for adenopathy. Does not bruise/bleed easily.  Psychiatric/Behavioral: Negative.      Objective:  BP 116/70 (BP Location: Left Arm, Patient Position: Sitting, Cuff Size: Large)   Pulse (!) 57   Temp 98.2 F (36.8 C) (Oral)   Ht 5' 3"  (1.6 m)   Wt 155 lb (70.3 kg)   SpO2 97%   BMI 27.46 kg/m   BP Readings from Last 3 Encounters:  02/13/22 116/70  11/15/21 124/80  08/22/21 136/86    Wt Readings from Last 3 Encounters:  02/13/22 155 lb (70.3 kg)  11/15/21 155 lb (70.3 kg)  08/22/21 157 lb (71.2 kg)    Physical Exam Vitals reviewed.  HENT:      Nose: Nose normal.     Mouth/Throat:     Mouth: Mucous membranes are moist.  Eyes:     General: No scleral icterus.    Conjunctiva/sclera: Conjunctivae normal.  Cardiovascular:     Rate and Rhythm: Regular rhythm. Bradycardia present.     Pulses: Normal pulses.     Heart sounds: No murmur heard.    Comments: EKG- SB, 54 bpm Otherwise normal EKG Pulmonary:     Effort: Pulmonary effort is normal.     Breath sounds: No stridor. No wheezing, rhonchi or rales.  Abdominal:     Palpations: There is no mass.     Tenderness: There is no abdominal tenderness. There is no guarding.     Hernia: No hernia is present.  Musculoskeletal:        General: Normal range of motion.     Right lower leg: No edema.     Left lower leg: No edema.  Skin:    General: Skin is dry.     Coloration: Skin is not pale.  Neurological:     General: No focal deficit present.     Mental Status: She is alert. Mental status is at baseline.  Psychiatric:        Mood and Affect: Mood normal.        Behavior: Behavior normal.     Lab Results  Component Value Date   WBC 5.7 11/15/2021   HGB 14.5 11/15/2021   HCT 41.9 11/15/2021   PLT 208 11/15/2021   GLUCOSE 90 02/13/2022   CHOL 143 02/13/2022   TRIG 91.0 02/13/2022   HDL 50.00 02/13/2022   LDLDIRECT 142.5 09/30/2012   LDLCALC 75 02/13/2022   ALT 18 02/13/2022   AST 21 02/13/2022   NA 139 02/13/2022   K 3.9 02/13/2022   CL 99 02/13/2022   CREATININE 0.82 02/13/2022   BUN 14 02/13/2022   CO2 33 (H) 02/13/2022   TSH 2.05 02/13/2022   INR 1.7 (H) 03/06/2021   HGBA1C 5.8 09/10/2018    No results found.  Assessment & Plan:   Colleen White was seen today for hypertension and anemia.  Diagnoses and all orders for this visit:  Essential hypertension- Her blood pressure is well controlled. -     Basic metabolic panel; Future -     Hepatic function panel; Future -     EKG 12-Lead -     TSH; Future -     TSH -     Hepatic function panel -      Basic metabolic panel  NASH (nonalcoholic steatohepatitis)- Her liver enzymes are  normal. -     Hepatic function panel; Future -     Hepatic function panel  B12 deficiency due to diet -     Vitamin B12; Future -     Folate; Future -     CBC with Differential/Platelet; Future -     CBC with Differential/Platelet -     Folate -     Vitamin B12  Deficiency anemia- Her vitamin levels are normal. -     Vitamin B6; Future -     Vitamin B1; Future -     CBC with Differential/Platelet; Future -     CBC with Differential/Platelet -     Vitamin B1 -     Vitamin B6  Vitamin D deficiency disease -     VITAMIN D 25 Hydroxy (Vit-D Deficiency, Fractures); Future -     VITAMIN D 25 Hydroxy (Vit-D Deficiency, Fractures)  Pure hypercholesterolemia- LDL goal achieved. Doing well on the statin  -     Lipid panel; Future -     TSH; Future -     TSH -     Lipid panel  Gastroesophageal reflux disease without esophagitis  Varicose veins of right lower extremity with pain -     Ambulatory referral to Vascular Surgery  Other orders -     Pneumococcal polysaccharide vaccine 23-valent greater than or equal to 2yo subcutaneous/IM   I have discontinued Colleen White's METAMUCIL FIBER PO. I am also having her maintain her tretinoin, Multiple Vitamins-Minerals (PRESERVISION AREDS 2 PO), Probiotic Product (PROBIOTIC DAILY PO), brimonidine-timolol, loratadine, Vitamin D3, mirtazapine, docusate sodium, esomeprazole, Co Q 10, Ascorbic Acid (VITAMIN C PO), calcium citrate, MINOXIDIL FOR MEN EX, gabapentin, Multiple Vitamins-Minerals (CENTRUM SILVER PO), diltiazem, tiZANidine, telmisartan-hydrochlorothiazide, dicyclomine, potassium chloride, Xarelto, pravastatin, and traMADol.  No orders of the defined types were placed in this encounter.    Follow-up: Return in about 6 months (around 08/15/2022).  Colleen Calico, MD

## 2022-02-13 NOTE — Patient Instructions (Signed)
Bradycardia, Adult Bradycardia is a slower-than-normal heartbeat. A normal resting heart rate for an adult ranges from 60 to 100 beats per minute. With bradycardia, the resting heart rate is less than 60 beats per minute. Bradycardia can prevent enough oxygen from reaching certain areas of your body when you are active. It can be serious if it keeps enough oxygen from reaching your brain and other parts of your body. Bradycardia is not a problem for everyone. For some healthy adults, a slow resting heart rate is normal. What are the causes? This condition may be caused by: A problem with the heart, including: A problem with the heart's electrical system, such as a heart block. With a heart block, electrical signals between the chambers of the heart are partially or completely blocked, so they are not able to work as they should. A problem with the heart's natural pacemaker (sinus node). Heart disease. A heart attack. Heart damage. Lyme disease. A heart infection. A heart condition that is present at birth (congenital heart defect). Certain medicines that treat heart conditions. Certain conditions, such as hypothyroidism and obstructive sleep apnea. Problems with the balance of chemicals and other substances, like potassium, in the blood. Trauma. Radiation therapy. What increases the risk? You are more likely to develop this condition if you: Are age 42 or older. Have high blood pressure (hypertension), high cholesterol (hyperlipidemia), or diabetes. Drink heavily, use tobacco or nicotine products, or use drugs. What are the signs or symptoms? Symptoms of this condition include: Light-headedness. Feeling faint or fainting. Fatigue and weakness. Trouble with activity or exercise. Shortness of breath. Chest pain (angina). Drowsiness. Confusion. Dizziness. How is this diagnosed? This condition may be diagnosed based on: Your symptoms. Your medical history. A physical exam. During  the exam, your health care provider will listen to your heartbeat and check your pulse. To confirm the diagnosis, your health care provider may order tests, such as: Blood tests. An electrocardiogram (ECG). This test records the heart's electrical activity. The test can show how fast your heart is beating and whether the heartbeat is steady. A test in which you wear a portable device (event recorder or Holter monitor) to record your heart's electrical activity while you go about your day. An exercise test. How is this treated? Treatment for this condition depends on the cause of the condition and how severe your symptoms are. Treatment may involve: Treatment of the underlying condition. Changing your medicines or how much medicine you take. Having a small, battery-operated device called a pacemaker implanted under the skin. When bradycardia occurs, this device can be used to increase your heart rate and help your heart beat in a regular rhythm. Follow these instructions at home: Lifestyle Manage any health conditions that contribute to bradycardia as told by your health care provider. Follow a heart-healthy diet. A nutrition specialist (dietitian) can help educate you about healthy food options and changes. Follow an exercise program that is approved by your health care provider. Maintain a healthy weight. Try to reduce or manage your stress, such as with yoga or meditation. If you need help reducing stress, ask your health care provider. Do not use any products that contain nicotine or tobacco. These products include cigarettes, chewing tobacco, and vaping devices, such as e-cigarettes. If you need help quitting, ask your health care provider. Do not use illegal drugs. Alcohol use If you drink alcohol: Limit how much you have to: 0-1 drink a day for women who are not pregnant. 0-2 drinks a day  for men. Know how much alcohol is in a drink. In the [place] one drink equals one 12 oz bottle of  beer (355 mL), one 5 oz glass of wine (148 mL), or one 1 oz glass of hard liquor (44 mL). General instructions Take over-the-counter and prescription medicines only as told by your health care provider. Keep all follow-up visits. This is important. How is this prevented? In some cases, bradycardia may be prevented by: Treating underlying medical problems. Stopping behaviors or medicines that can trigger the condition. Contact a health care provider if: You feel light-headed or dizzy. You almost faint. You feel weak or are easily fatigued during physical activity. You experience confusion or have memory problems. Get help right away if: You faint. You have chest pains or an irregular heartbeat (palpitations). You have trouble breathing. These symptoms may represent a serious problem that is an emergency. Do not wait to see if the symptoms will go away. Get medical help right away. Call your local emergency services (911 in the [place] Do not drive yourself to the hospital. Summary Bradycardia is a slower-than-normal heartbeat. With bradycardia, the resting heart rate is less than 60 beats per minute. Treatment for this condition depends on the cause. Manage any health conditions that contribute to bradycardia as told by your health care provider. Do not use any products that contain nicotine or tobacco. These products include cigarettes, chewing tobacco, and vaping devices, such as e-cigarettes. Keep all follow-up visits. This is important. This information is not intended to replace advice given to you by your health care provider. Make sure you discuss any questions you have with your health care provider. Document Revised: 12/04/2020 Document Reviewed: 12/04/2020 Elsevier Patient Education  [place].

## 2022-02-19 DIAGNOSIS — Z01411 Encounter for gynecological examination (general) (routine) with abnormal findings: Secondary | ICD-10-CM | POA: Diagnosis not present

## 2022-02-19 DIAGNOSIS — Z90710 Acquired absence of both cervix and uterus: Secondary | ICD-10-CM | POA: Diagnosis not present

## 2022-02-19 DIAGNOSIS — Z0142 Encounter for cervical smear to confirm findings of recent normal smear following initial abnormal smear: Secondary | ICD-10-CM | POA: Diagnosis not present

## 2022-02-19 DIAGNOSIS — Z01419 Encounter for gynecological examination (general) (routine) without abnormal findings: Secondary | ICD-10-CM | POA: Diagnosis not present

## 2022-02-19 DIAGNOSIS — Z6828 Body mass index (BMI) 28.0-28.9, adult: Secondary | ICD-10-CM | POA: Diagnosis not present

## 2022-02-19 DIAGNOSIS — R8290 Unspecified abnormal findings in urine: Secondary | ICD-10-CM | POA: Diagnosis not present

## 2022-02-19 DIAGNOSIS — Z124 Encounter for screening for malignant neoplasm of cervix: Secondary | ICD-10-CM | POA: Diagnosis not present

## 2022-02-20 DIAGNOSIS — Z9189 Other specified personal risk factors, not elsewhere classified: Secondary | ICD-10-CM | POA: Diagnosis not present

## 2022-02-20 DIAGNOSIS — R922 Inconclusive mammogram: Secondary | ICD-10-CM | POA: Diagnosis not present

## 2022-02-22 LAB — VITAMIN B1: Vitamin B1 (Thiamine): 32 nmol/L — ABNORMAL HIGH (ref 8–30)

## 2022-02-22 LAB — VITAMIN B6: Vitamin B6: 38.3 ng/mL — ABNORMAL HIGH (ref 2.1–21.7)

## 2022-03-05 ENCOUNTER — Ambulatory Visit (INDEPENDENT_AMBULATORY_CARE_PROVIDER_SITE_OTHER): Payer: Medicare Other | Admitting: Internal Medicine

## 2022-03-05 VITALS — BP 118/72 | HR 79 | Temp 98.1°F | Ht 63.0 in | Wt 157.4 lb

## 2022-03-05 DIAGNOSIS — I10 Essential (primary) hypertension: Secondary | ICD-10-CM

## 2022-03-05 DIAGNOSIS — I480 Paroxysmal atrial fibrillation: Secondary | ICD-10-CM | POA: Diagnosis not present

## 2022-03-05 NOTE — Progress Notes (Unsigned)
Subjective:  Patient ID: Colleen White, female    DOB: [birth year]  Age: 76 y.o. MRN: [id number]  CC: No chief complaint on file.   HPI Colleen White presents for ***  Outpatient Medications Prior to Visit  Medication Sig Dispense Refill   Ascorbic Acid (VITAMIN C PO) Take 500 mg by mouth daily.      brimonidine-timolol (COMBIGAN) 0.2-0.5 % ophthalmic solution Place 1 drop into both eyes every 12 (twelve) hours.      calcium citrate (CALCITRATE - DOSED IN MG ELEMENTAL CALCIUM) 950 (200 Ca) MG tablet Take 200 mg of elemental calcium by mouth 2 (two) times daily.     Cholecalciferol (VITAMIN D3) 1000 units CAPS Take 1,000 Units by mouth.      Coenzyme Q10 (CO Q 10) 100 MG CAPS Take 1 tablet by mouth daily.      dicyclomine (BENTYL) 10 MG capsule Take 10 mg by mouth as needed for spasms.     diltiazem (CARDIZEM CD) 120 MG 24 hr capsule TAKE ONE CAPSULE BY MOUTH DAILY 90 capsule 3   docusate sodium (COLACE) 100 MG capsule Take 200 mg by mouth as needed.     esomeprazole (NEXIUM) 20 MG packet Take 20 mg by mouth at bedtime. Takes as needed.     gabapentin (NEURONTIN) 100 MG capsule Take 3 capsules (300 mg total) by mouth at bedtime. 270 capsule 1   loratadine (CLARITIN) 10 MG tablet Take by mouth daily as needed.      MINOXIDIL FOR MEN EX Apply topically. Takes as needed at night.     mirtazapine (REMERON) 15 MG tablet Take 7.5 mg by mouth as needed. Takes 1/4 tablet at bedtime for itching of scalp     Multiple Vitamins-Minerals (CENTRUM SILVER PO) Take 1 tablet by mouth daily.     Multiple Vitamins-Minerals (PRESERVISION AREDS 2 PO) Take 1 tablet by mouth 2 (two) times daily.      potassium chloride (MICRO-K) 10 MEQ CR capsule Take 10 mEq by mouth 2 (two) times daily.     pravastatin (PRAVACHOL) 40 MG tablet TAKE ONE TABLET BY MOUTH AT BEDTIME 90 tablet 1   Probiotic Product (PROBIOTIC DAILY PO) Take 1 tablet by mouth daily.     telmisartan-hydrochlorothiazide (MICARDIS HCT) 80-12.5  MG tablet Take 1 tablet by mouth daily.     tiZANidine (ZANAFLEX) 4 MG tablet Take 1 tablet (4 mg total) by mouth every 8 (eight) hours as needed for muscle spasms. 90 tablet 3   traMADol (ULTRAM) 50 MG tablet TAKE ONE TABLET BY MOUTH EVERY 6 HOURS AS NEEDED 65 tablet 3   tretinoin (RETIN-A) 0.05 % cream Once daily     XARELTO 20 MG TABS tablet TAKE ONE TABLET BY MOUTH WITH SUPPER 30 tablet 6   No facility-administered medications prior to visit.    ROS Review of Systems  Objective:  BP 118/72 (BP Location: Left Arm, Patient Position: Sitting)   Pulse 79   Temp 98.1 F (36.7 C) (Oral)   Ht 5' 3"  (1.6 m)   Wt 157 lb 6 oz (71.4 kg)   SpO2 98%   BMI 27.88 kg/m   BP Readings from Last 3 Encounters:  03/05/22 118/72  02/13/22 116/70  11/15/21 124/80    Wt Readings from Last 3 Encounters:  03/05/22 157 lb 6 oz (71.4 kg)  02/13/22 155 lb (70.3 kg)  11/15/21 155 lb (70.3 kg)    Physical Exam  Lab Results  Component Value Date   WBC  5.7 11/15/2021   HGB 14.5 11/15/2021   HCT 41.9 11/15/2021   PLT 208 11/15/2021   GLUCOSE 90 02/13/2022   CHOL 143 02/13/2022   TRIG 91.0 02/13/2022   HDL 50.00 02/13/2022   LDLDIRECT 142.5 09/30/2012   LDLCALC 75 02/13/2022   ALT 18 02/13/2022   AST 21 02/13/2022   NA 139 02/13/2022   K 3.9 02/13/2022   CL 99 02/13/2022   CREATININE 0.82 02/13/2022   BUN 14 02/13/2022   CO2 33 (H) 02/13/2022   TSH 2.05 02/13/2022   INR 1.7 (H) 03/06/2021   HGBA1C 5.8 09/10/2018    No results found.  Assessment & Plan:   There are no diagnoses linked to this encounter. I am having Colleen White maintain her tretinoin, Multiple Vitamins-Minerals (PRESERVISION AREDS 2 PO), Probiotic Product (PROBIOTIC DAILY PO), brimonidine-timolol, loratadine, Vitamin D3, mirtazapine, docusate sodium, esomeprazole, Co Q 10, Ascorbic Acid (VITAMIN C PO), calcium citrate, MINOXIDIL FOR MEN EX, gabapentin, Multiple Vitamins-Minerals (CENTRUM SILVER PO), diltiazem,  tiZANidine, telmisartan-hydrochlorothiazide, dicyclomine, potassium chloride, Xarelto, pravastatin, and traMADol.  No orders of the defined types were placed in this encounter.    Follow-up: No follow-ups on file.  Scarlette Calico, MD

## 2022-03-06 ENCOUNTER — Encounter: Payer: Self-pay | Admitting: Internal Medicine

## 2022-03-07 DIAGNOSIS — R823 Hemoglobinuria: Secondary | ICD-10-CM | POA: Diagnosis not present

## 2022-03-08 DIAGNOSIS — M13832 Other specified arthritis, left wrist: Secondary | ICD-10-CM | POA: Diagnosis not present

## 2022-03-08 DIAGNOSIS — M67432 Ganglion, left wrist: Secondary | ICD-10-CM | POA: Diagnosis not present

## 2022-03-08 DIAGNOSIS — M25532 Pain in left wrist: Secondary | ICD-10-CM | POA: Diagnosis not present

## 2022-03-08 DIAGNOSIS — M1811 Unilateral primary osteoarthritis of first carpometacarpal joint, right hand: Secondary | ICD-10-CM | POA: Insufficient documentation

## 2022-03-08 DIAGNOSIS — M13841 Other specified arthritis, right hand: Secondary | ICD-10-CM | POA: Diagnosis not present

## 2022-03-12 ENCOUNTER — Other Ambulatory Visit (HOSPITAL_COMMUNITY): Payer: Self-pay | Admitting: Cardiology

## 2022-04-17 DIAGNOSIS — C8230 Follicular lymphoma grade IIIa, unspecified site: Secondary | ICD-10-CM | POA: Diagnosis not present

## 2022-04-19 ENCOUNTER — Other Ambulatory Visit: Payer: Self-pay | Admitting: *Deleted

## 2022-04-19 DIAGNOSIS — I83811 Varicose veins of right lower extremities with pain: Secondary | ICD-10-CM
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
## 2022-05-01 ENCOUNTER — Ambulatory Visit (INDEPENDENT_AMBULATORY_CARE_PROVIDER_SITE_OTHER): Payer: Medicare Other | Admitting: Physician Assistant

## 2022-05-01 ENCOUNTER — Ambulatory Visit (HOSPITAL_COMMUNITY)
Admission: RE | Admit: 2022-05-01 | Discharge: 2022-05-01 | Disposition: A | Discharge status: Home / Self Care | Payer: Medicare Other | Source: Ambulatory Visit | Attending: Vascular Surgery | Admitting: Vascular Surgery

## 2022-05-01 VITALS — HR 61 | Temp 97.6°F | Resp 20 | Ht 63.0 in | Wt 156.9 lb

## 2022-05-01 DIAGNOSIS — I83811 Varicose veins of right lower extremities with pain: Secondary | ICD-10-CM | POA: Insufficient documentation

## 2022-05-14 DIAGNOSIS — M545 Low back pain, unspecified: Secondary | ICD-10-CM | POA: Diagnosis not present

## 2022-05-21 ENCOUNTER — Other Ambulatory Visit: Payer: Self-pay | Admitting: Internal Medicine

## 2022-05-21 DIAGNOSIS — M159 Polyosteoarthritis, unspecified: Secondary | ICD-10-CM

## 2022-06-11 ENCOUNTER — Other Ambulatory Visit: Payer: Self-pay | Admitting: Internal Medicine

## 2022-06-11 DIAGNOSIS — I10 Essential (primary) hypertension: Secondary | ICD-10-CM

## 2022-06-19 ENCOUNTER — Telehealth: Payer: Self-pay | Admitting: *Deleted

## 2022-06-19 DIAGNOSIS — I10 Essential (primary) hypertension: Secondary | ICD-10-CM

## 2022-06-19 DIAGNOSIS — I5022 Chronic systolic (congestive) heart failure: Secondary | ICD-10-CM

## 2022-06-19 DIAGNOSIS — G4733 Obstructive sleep apnea (adult) (pediatric): Secondary | ICD-10-CM

## 2022-06-19 NOTE — Telephone Encounter (Signed)
Patient in the office today ask for a cpap replacement order.  [id number] Mode CPAP Set pressure 7 cmH2O EPR Fulltime EPR level 1 Therapy Leaks - L/min 95th percentile: 26.2 Events per hour AHI: 2.7

## 2022-06-22 NOTE — Telephone Encounter (Signed)
Per Dr Radford Pax, Order ResMed CPAP on 7cm H2O with heated humidity and mask of choice  Order placed to [facility] via community nmessage.

## 2022-07-06 ENCOUNTER — Telehealth: Payer: Medicare Other

## 2022-07-09 ENCOUNTER — Other Ambulatory Visit: Payer: Self-pay | Admitting: Internal Medicine

## 2022-07-09 ENCOUNTER — Other Ambulatory Visit (HOSPITAL_COMMUNITY): Payer: Self-pay | Admitting: Cardiology

## 2022-07-09 DIAGNOSIS — E876 Hypokalemia: Secondary | ICD-10-CM

## 2022-08-01 ENCOUNTER — Other Ambulatory Visit: Payer: Self-pay | Admitting: Internal Medicine

## 2022-08-01 DIAGNOSIS — H02403 Unspecified ptosis of bilateral eyelids: Secondary | ICD-10-CM | POA: Diagnosis not present

## 2022-08-01 DIAGNOSIS — E7800 Pure hypercholesterolemia, unspecified: Secondary | ICD-10-CM

## 2022-08-01 DIAGNOSIS — H401131 Primary open-angle glaucoma, bilateral, mild stage: Secondary | ICD-10-CM | POA: Diagnosis not present

## 2022-08-02 ENCOUNTER — Ambulatory Visit (HOSPITAL_COMMUNITY)
Admission: RE | Admit: 2022-08-02 | Discharge: 2022-08-02 | Disposition: A | Discharge status: Home / Self Care | Payer: Medicare Other | Source: Ambulatory Visit | Attending: Cardiology | Admitting: Cardiology

## 2022-08-02 ENCOUNTER — Encounter (HOSPITAL_COMMUNITY): Payer: Self-pay | Admitting: Cardiology

## 2022-08-02 VITALS — BP 94/50 | HR 62 | Wt 155.6 lb

## 2022-08-02 DIAGNOSIS — Z7901 Long term (current) use of anticoagulants: Secondary | ICD-10-CM | POA: Diagnosis not present

## 2022-08-02 DIAGNOSIS — R002 Palpitations: Secondary | ICD-10-CM | POA: Diagnosis not present

## 2022-08-02 DIAGNOSIS — Z79899 Other long term (current) drug therapy: Secondary | ICD-10-CM | POA: Insufficient documentation

## 2022-08-02 DIAGNOSIS — G4733 Obstructive sleep apnea (adult) (pediatric): Secondary | ICD-10-CM | POA: Diagnosis not present

## 2022-08-02 DIAGNOSIS — I480 Paroxysmal atrial fibrillation: Secondary | ICD-10-CM | POA: Diagnosis not present

## 2022-08-02 DIAGNOSIS — I5022 Chronic systolic (congestive) heart failure: Secondary | ICD-10-CM | POA: Diagnosis not present

## 2022-08-02 DIAGNOSIS — I10 Essential (primary) hypertension: Secondary | ICD-10-CM | POA: Insufficient documentation

## 2022-08-02 DIAGNOSIS — E785 Hyperlipidemia, unspecified: Secondary | ICD-10-CM | POA: Insufficient documentation

## 2022-08-02 LAB — BASIC METABOLIC PANEL
Anion gap: 9 (ref 5–15)
BUN: 14 mg/dL (ref 8–23)
CO2: 28 mmol/L (ref 22–32)
Calcium: 9.6 mg/dL (ref 8.9–10.3)
Chloride: 103 mmol/L (ref 98–111)
Creatinine, Ser: 0.88 mg/dL (ref 0.44–1.00)
GFR, Estimated: >60 mL/min (ref >60)
Glucose, Bld: 102 mg/dL — ABNORMAL HIGH (ref 70–99)
Potassium: 3.9 mmol/L (ref 3.5–5.1)
Sodium: 140 mmol/L (ref 135–145)

## 2022-08-02 LAB — CBC
HCT: 39.1 % (ref 36.0–46.0)
Hemoglobin: 13.6 g/dL (ref 12.0–15.0)
MCH: 32.2 pg (ref 26.0–34.0)
MCHC: 34.8 g/dL (ref 30.0–36.0)
MCV: 92.4 fL (ref 80.0–100.0)
Platelets: 201 10*3/uL (ref 150–400)
RBC: 4.23 MIL/uL (ref 3.87–5.11)
RDW: 12.5 % (ref 11.5–15.5)
WBC: 5.7 10*3/uL (ref 4.0–10.5)
nRBC: 0 % (ref 0.0–0.2)

## 2022-08-02 NOTE — Patient Instructions (Signed)
There has been no changes to your medications.    Labs done today, your results will be available in MyChart, we will contact you for abnormal readings.  Your physician recommends that you schedule a follow-up appointment in: 1 year ( December 2024)  ** please call the office in September to arrange your follow up appointment **  If you have any questions or concerns before your next appointment please send [place] a message through [place] or call our office at [phone].    TO LEAVE A MESSAGE FOR THE NURSE SELECT OPTION 2, PLEASE LEAVE A MESSAGE INCLUDING: YOUR NAME DATE OF BIRTH CALL BACK NUMBER REASON FOR CALL**this is important as we prioritize the call backs  YOU WILL RECEIVE A CALL BACK THE SAME DAY AS LONG AS YOU CALL BEFORE 4:00 PM  At the [facility], you and your health needs are our priority. As part of our continuing mission to provide you with exceptional heart care, we have created designated Provider Care Teams. These Care Teams include your primary Cardiologist (physician) and Advanced Practice Providers (APPs- Physician Assistants and Nurse Practitioners) who all work together to provide you with the care you need, when you need it.   You may see any of the following providers on your designated Care Team at your next follow up: Dr Glori Bickers Dr [name] Dr. Roxana Hires, NP Lyda Jester, [place] [facility] [place], [place] Forestine Na, NP Audry Riles, PharmD   Please be sure to bring in all your medications bottles to every appointment.

## 2022-08-03 NOTE — Progress Notes (Signed)
Patient ID: Colleen White, female   DOB: [birth year], 76 y.o.   MRN: [id number] PCP: Dr. Ronnald Ramp Cardiology: Dr. Aundra Dubin  76 y.o. with history of HTN, hyperlipidemia, paroxysmal atrial fibrillation and prior non-Hodgkins lymphoma presents for followup of atrial fibrillation.  She called the office in 1/17 to report increased episodes of palpitations.  She had had rare tachypalpitations for years, but in 1/17 she had 2 episodes of tachypalpitations close together.  I had her wear an event monitor.  By monitor, her tachypalpitations correlated with atrial fibrillation with RVR.  She was started on Xarelto and Toprol XL.  Toprol XL caused swelling, so it was stopped and diltiazem CD was begun.  Echo in 3/17 showed EF 65-70% with mildly elevated LVOT velocity due to vigorous contraction.  She is doing well today.  Rare flutter, will occasionally take an extra diltiazem.  No exertional dyspnea or chest pain.  She is tired by the end of the day.  No lightheadedness.  Using CPAP at night.   ECG: NSR, normal (personally reviewed)  Labs (4/15): K 3.7, creatinine 0.6, LDL 111, HDL 46 Labs (4/16): LDL 89 Labs (3/17): K 3.8, creatinine 0.7, HCT 37.3 Labs (4/17): LDL 93, HDL 46 Labs (8/17): hgb 13.1, K 3.4, creatinine 0.7 Labs (4/18): LDL 99, hgb 13.7 Labs (10/18): K 3.7, creatinine 0.75 Labs (6/19): LDL 97 Labs (8/19): K 3.9, creatinine 0.84 Labs (8/20): K 3.9, creatinine 0.8, LDL 88 Labs (3/21): LDL 73 Labs (6/21): K 3.4, creatinine 0.73 Labs (11/21): LDL 87, K 3.6, creatinine 0.68 Labs (12/21): hgb 12.9 Labs (9/22): K 4.1, creatinine 0.79 Labs (11/22): LDL 85, K 4, creatinine 0.67, TSH normal, hgb 13.2 Labs (6/23): LDL 75 Labs (8/23): K 4.5, reatinine 0.88  PMH: 1. Echo (11/15): EF 60-65%, trivial MR. Echo (3/17) with EF 65-70%, mildly elevated LVOT velocity due to vigorous contraction. 2. Chest pain: Myoview normal in 2/05. Stress echo (11/15) with no evidence for ischemia or infarction.  3.  Suspected glaucoma 4. HTN 5. Hyperlipidemia 6. GERD 7. Diverticulosis 8. TAH/BSO 9. Non-Hodgkins lymphoma: In remission.  Treated at [facility] with surgery and XRT.  Did not have chemotherapy.  10. Pulmonary nodules: Presumed benign after observation.  11. Atrial fibrillation: Paroxysmal.  12. OSA: Uses CPAP.  13. COVID-19 9/22 14. Spondylolisthesis  SH: Married, cares for elderly mother, nonsmoker.  Lives in [place].  FH: Mother with atrial fibrillation and CAD.   ROS: All systems reviewed and negative except as per HPI.   Current Outpatient Medications  Medication Sig Dispense Refill   Ascorbic Acid (VITAMIN C PO) Take 500 mg by mouth daily.      brimonidine-timolol (COMBIGAN) 0.2-0.5 % ophthalmic solution Place 1 drop into both eyes every 12 (twelve) hours.      calcium citrate (CALCITRATE - DOSED IN MG ELEMENTAL CALCIUM) 950 (200 Ca) MG tablet Take 200 mg of elemental calcium by mouth 2 (two) times daily.     Cholecalciferol (VITAMIN D3) 1000 units CAPS Take 1,000 Units by mouth.      Coenzyme Q10 (CO Q 10) 100 MG CAPS Take 1 tablet by mouth daily.      dicyclomine (BENTYL) 10 MG capsule Take 10 mg by mouth as needed for spasms.     diltiazem (CARDIZEM CD) 120 MG 24 hr capsule TAKE ONE CAPSULE BY MOUTH DAILY 90 capsule 3   docusate sodium (COLACE) 100 MG capsule Take 200 mg by mouth as needed.     esomeprazole (NEXIUM) 20 MG packet Take 20 mg  by mouth at bedtime. Takes as needed.     gabapentin (NEURONTIN) 100 MG capsule Take 3 capsules (300 mg total) by mouth at bedtime. 270 capsule 1   loratadine (CLARITIN) 10 MG tablet Take by mouth daily as needed.      LYCOPENE PO Take by mouth.     MINOXIDIL FOR MEN EX Apply topically. Takes as needed at night.     mirtazapine (REMERON) 15 MG tablet Take 7.5 mg by mouth as needed. Takes 1/4 tablet at bedtime for itching of scalp     Multiple Vitamins-Minerals (CENTRUM SILVER PO) Take 1 tablet by mouth daily.     Multiple Vitamins-Minerals  (PRESERVISION AREDS 2 PO) Take 1 tablet by mouth 2 (two) times daily.      potassium chloride (MICRO-K) 10 MEQ CR capsule TAKE 1 CAPULE DAILY WITH BREAKFAST, LUNCH, AND EVENING MEAL. 270 capsule 0   pravastatin (PRAVACHOL) 40 MG tablet TAKE ONE TABLET BY MOUTH AT BEDTIME 90 tablet 1   Probiotic Product (PROBIOTIC DAILY PO) Take 1 tablet by mouth daily.     telmisartan-hydrochlorothiazide (MICARDIS HCT) 80-12.5 MG tablet TAKE ONE TABLET BY MOUTH ONCE DAILY 90 tablet 1   traMADol (ULTRAM) 50 MG tablet TAKE ONE TABLET BY MOUTH EVERY 6 HOURS AS NEEDED 65 tablet 3   tretinoin (RETIN-A) 0.05 % cream Once daily     XARELTO 20 MG TABS tablet TAKE ONE TABLET BY MOUTH WITH SUPPER 30 tablet 6   tiZANidine (ZANAFLEX) 4 MG tablet Take 1 tablet (4 mg total) by mouth every 8 (eight) hours as needed for muscle spasms. 90 tablet 3   No current facility-administered medications for this encounter.    BP (!) 94/50   Pulse 62   Wt 70.6 kg (155 lb 9.6 oz)   SpO2 98%   BMI 27.56 kg/m  General: NAD Neck: No JVD, no thyromegaly or thyroid nodule.  Lungs: Clear to auscultation bilaterally with normal respiratory effort. CV: Nondisplaced PMI.  Heart regular S1/S2, no S3/S4, no murmur.  No peripheral edema.  No carotid bruit.  Normal pedal pulses.  Abdomen: Soft, nontender, no hepatosplenomegaly, no distention.  Skin: Intact without lesions or rashes.  Neurologic: Alert and oriented x 3.  Psych: Normal affect. Extremities: No clubbing or cyanosis.  HEENT: Normal.   Assessment/Plan: 1. HTN: BP controlled.  - Continue telmisartan/HCTZ and diltiazem CD.  BMET today.  2. Hyperlipidemia: Lipids ok in 6/23.   3. Atrial fibrillation: Paroxysmal.  First noted in 1/17.  Rare palpitations.   CHADSVASC = 3.  Best route for prevention will be to continue to control OSA and HTN.  - Continue diltiazem CD 120 daily.  - Continue Xarelto 20 daily. BMET and CBC today. - Continue CPAP 4. OSA: Using CPAP.   Followup in 1  year.   [name] 08/03/2022

## 2022-08-16 ENCOUNTER — Ambulatory Visit (INDEPENDENT_AMBULATORY_CARE_PROVIDER_SITE_OTHER): Payer: Medicare Other | Admitting: Internal Medicine

## 2022-08-16 ENCOUNTER — Encounter: Payer: Self-pay | Admitting: Internal Medicine

## 2022-08-16 VITALS — BP 118/68 | HR 54 | Temp 98.4°F | Ht 63.0 in | Wt 157.6 lb

## 2022-08-16 DIAGNOSIS — L280 Lichen simplex chronicus: Secondary | ICD-10-CM

## 2022-08-16 DIAGNOSIS — I10 Essential (primary) hypertension: Secondary | ICD-10-CM

## 2022-08-16 DIAGNOSIS — R053 Chronic cough: Secondary | ICD-10-CM | POA: Diagnosis not present

## 2022-08-16 DIAGNOSIS — Z23 Encounter for immunization: Secondary | ICD-10-CM

## 2022-08-16 DIAGNOSIS — E876 Hypokalemia: Secondary | ICD-10-CM

## 2022-08-16 LAB — TSH: TSH: 2.6 u[IU]/mL (ref 0.35–5.50)

## 2022-08-16 MED ORDER — POTASSIUM CHLORIDE ER 10 MEQ PO CPCR: 10.0000 meq | ORAL_CAPSULE | Freq: Two times a day (BID) | ORAL | 1 refills | Status: DC

## 2022-08-16 MED ORDER — HYDROCOD POLI-CHLORPHE POLI ER 10-8 MG/5ML PO SUER: 5.0000 mL | Freq: Two times a day (BID) | ORAL | 0 refills | Status: DC | PRN

## 2022-08-16 NOTE — Patient Instructions (Signed)
Hypertension, Adult High blood pressure (hypertension) is when the force of blood pumping through the arteries is too strong. The arteries are the blood vessels that carry blood from the heart throughout the body. Hypertension forces the heart to work harder to pump blood and may cause arteries to become narrow or stiff. Untreated or uncontrolled hypertension can lead to a heart attack, heart failure, a stroke, kidney disease, and other problems. A blood pressure reading consists of a higher number over a lower number. Ideally, your blood pressure should be below 120/80. The first ("top") number is called the systolic pressure. It is a measure of the pressure in your arteries as your heart beats. The second ("bottom") number is called the diastolic pressure. It is a measure of the pressure in your arteries as the heart relaxes. What are the causes? The exact cause of this condition is not known. There are some conditions that result in high blood pressure. What increases the risk? Certain factors may make you more likely to develop high blood pressure. Some of these risk factors are under your control, including: Smoking. Not getting enough exercise or physical activity. Being overweight. Having too much fat, sugar, calories, or salt (sodium) in your diet. Drinking too much alcohol. Other risk factors include: Having a personal history of heart disease, diabetes, high cholesterol, or kidney disease. Stress. Having a family history of high blood pressure and high cholesterol. Having obstructive sleep apnea. Age. The risk increases with age. What are the signs or symptoms? High blood pressure may not cause symptoms. Very high blood pressure (hypertensive crisis) may cause: Headache. Fast or irregular heartbeats (palpitations). Shortness of breath. Nosebleed. Nausea and vomiting. Vision changes. Severe chest pain, dizziness, and seizures. How is this diagnosed? This condition is diagnosed by  measuring your blood pressure while you are seated, with your arm resting on a flat surface, your legs uncrossed, and your feet flat on the floor. The cuff of the blood pressure monitor will be placed directly against the skin of your upper arm at the level of your heart. Blood pressure should be measured at least twice using the same arm. Certain conditions can cause a difference in blood pressure between your right and left arms. If you have a high blood pressure reading during one visit or you have normal blood pressure with other risk factors, you may be asked to: Return on a different day to have your blood pressure checked again. Monitor your blood pressure at home for 1 week or longer. If you are diagnosed with hypertension, you may have other blood or imaging tests to help your health care provider understand your overall risk for other conditions. How is this treated? This condition is treated by making healthy lifestyle changes, such as eating healthy foods, exercising more, and reducing your alcohol intake. You may be referred for counseling on a healthy diet and physical activity. Your health care provider may prescribe medicine if lifestyle changes are not enough to get your blood pressure under control and if: Your systolic blood pressure is above 130. Your diastolic blood pressure is above 80. Your personal target blood pressure may vary depending on your medical conditions, your age, and other factors. Follow these instructions at home: Eating and drinking  Eat a diet that is high in fiber and potassium, and low in sodium, added sugar, and fat. An example of this eating plan is called the DASH diet. DASH stands for Dietary Approaches to Stop Hypertension. To eat this way: Eat   plenty of fresh fruits and vegetables. Try to fill one half of your plate at each meal with fruits and vegetables. Eat whole grains, such as whole-wheat pasta, brown rice, or whole-grain bread. Fill about one  fourth of your plate with whole grains. Eat or drink low-fat dairy products, such as skim milk or low-fat yogurt. Avoid fatty cuts of meat, processed or cured meats, and poultry with skin. Fill about one fourth of your plate with lean proteins, such as fish, chicken without skin, beans, eggs, or tofu. Avoid pre-made and processed foods. These tend to be higher in sodium, added sugar, and fat. Reduce your daily sodium intake. Many people with hypertension should eat less than 1,500 mg of sodium a day. Do not drink alcohol if: Your health care provider tells you not to drink. You are pregnant, may be pregnant, or are planning to become pregnant. If you drink alcohol: Limit how much you have to: 0-1 drink a day for women. 0-2 drinks a day for men. Know how much alcohol is in your drink. In the [place] one drink equals one 12 oz bottle of beer (355 mL), one 5 oz glass of wine (148 mL), or one 1 oz glass of hard liquor (44 mL). Lifestyle  Work with your health care provider to maintain a healthy body weight or to lose weight. Ask what an ideal weight is for you. Get at least 30 minutes of exercise that causes your heart to beat faster (aerobic exercise) most days of the week. Activities may include walking, swimming, or biking. Include exercise to strengthen your muscles (resistance exercise), such as Pilates or lifting weights, as part of your weekly exercise routine. Try to do these types of exercises for 30 minutes at least 3 days a week. Do not use any products that contain nicotine or tobacco. These products include cigarettes, chewing tobacco, and vaping devices, such as e-cigarettes. If you need help quitting, ask your health care provider. Monitor your blood pressure at home as told by your health care provider. Keep all follow-up visits. This is important. Medicines Take over-the-counter and prescription medicines only as told by your health care provider. Follow directions carefully. Blood  pressure medicines must be taken as prescribed. Do not skip doses of blood pressure medicine. Doing this puts you at risk for problems and can make the medicine less effective. Ask your health care provider about side effects or reactions to medicines that you should watch for. Contact a health care provider if you: Think you are having a reaction to a medicine you are taking. Have headaches that keep coming back (recurring). Feel dizzy. Have swelling in your ankles. Have trouble with your vision. Get help right away if you: Develop a severe headache or confusion. Have unusual weakness or numbness. Feel faint. Have severe pain in your chest or abdomen. Vomit repeatedly. Have trouble breathing. These symptoms may be an emergency. Get help right away. Call 911. Do not wait to see if the symptoms will go away. Do not drive yourself to the hospital. Summary Hypertension is when the force of blood pumping through your arteries is too strong. If this condition is not controlled, it may put you at risk for serious complications. Your personal target blood pressure may vary depending on your medical conditions, your age, and other factors. For most people, a normal blood pressure is less than 120/80. Hypertension is treated with lifestyle changes, medicines, or a combination of both. Lifestyle changes include losing weight, eating a healthy,   low-sodium diet, exercising more, and limiting alcohol. This information is not intended to replace advice given to you by your health care provider. Make sure you discuss any questions you have with your health care provider. Document Revised: 06/20/2021 Document Reviewed: 06/20/2021 Elsevier Patient Education  2023 Elsevier Inc.  

## 2022-08-16 NOTE — Progress Notes (Signed)
Subjective:  Patient ID: Colleen White, female    DOB: May 23, 1946  Age: 76 y.o. MRN: [id number]  CC: Hypertension   HPI Colleen White presents for f/up -  She is taking the K+ supplement once a day. She is active and denies DOE, CP, SOB, edema.  Outpatient Medications Prior to Visit  Medication Sig Dispense Refill   brimonidine-timolol (COMBIGAN) 0.2-0.5 % ophthalmic solution Place 1 drop into both eyes every 12 (twelve) hours.      calcium citrate (CALCITRATE - DOSED IN MG ELEMENTAL CALCIUM) 950 (200 Ca) MG tablet Take 200 mg of elemental calcium by mouth 2 (two) times daily.     Cholecalciferol (VITAMIN D3) 1000 units CAPS Take 1,000 Units by mouth.      Coenzyme Q10 (CO Q 10) 100 MG CAPS Take 1 tablet by mouth daily.      dicyclomine (BENTYL) 10 MG capsule Take 10 mg by mouth as needed for spasms.     diltiazem (CARDIZEM CD) 120 MG 24 hr capsule TAKE ONE CAPSULE BY MOUTH DAILY 90 capsule 3   gabapentin (NEURONTIN) 100 MG capsule Take 3 capsules (300 mg total) by mouth at bedtime. 270 capsule 1   loratadine (CLARITIN) 10 MG tablet Take by mouth daily as needed.      MINOXIDIL FOR MEN EX Apply topically. Takes as needed at night.     mirtazapine (REMERON) 15 MG tablet Take 7.5 mg by mouth as needed. Takes 1/4 tablet at bedtime for itching of scalp     Multiple Vitamins-Minerals (CENTRUM SILVER PO) Take 1 tablet by mouth daily.     Multiple Vitamins-Minerals (PRESERVISION AREDS 2 PO) Take 1 tablet by mouth 2 (two) times daily.      pravastatin (PRAVACHOL) 40 MG tablet TAKE ONE TABLET BY MOUTH AT BEDTIME 90 tablet 1   Probiotic Product (PROBIOTIC DAILY PO) Take 1 tablet by mouth daily.     telmisartan-hydrochlorothiazide (MICARDIS HCT) 80-12.5 MG tablet TAKE ONE TABLET BY MOUTH ONCE DAILY 90 tablet 1   tiZANidine (ZANAFLEX) 4 MG tablet Take 1 tablet (4 mg total) by mouth every 8 (eight) hours as needed for muscle spasms. 90 tablet 3   traMADol (ULTRAM) 50 MG tablet TAKE ONE TABLET  BY MOUTH EVERY 6 HOURS AS NEEDED 65 tablet 3   tretinoin (RETIN-A) 0.05 % cream Once daily     XARELTO 20 MG TABS tablet TAKE ONE TABLET BY MOUTH WITH SUPPER 30 tablet 6   potassium chloride (MICRO-K) 10 MEQ CR capsule TAKE 1 CAPULE DAILY WITH BREAKFAST, LUNCH, AND EVENING MEAL. 270 capsule 0   Ascorbic Acid (VITAMIN C PO) Take 500 mg by mouth daily.  (Patient not taking: Reported on 08/16/2022)     docusate sodium (COLACE) 100 MG capsule Take 200 mg by mouth as needed.     esomeprazole (NEXIUM) 20 MG packet Take 20 mg by mouth at bedtime. Takes as needed.     LYCOPENE PO Take by mouth. (Patient not taking: Reported on 08/16/2022)     No facility-administered medications prior to visit.    ROS Review of Systems  Constitutional: Negative.  Negative for diaphoresis, fever and unexpected weight change.  HENT: Negative.    Eyes: Negative.   Respiratory:  Positive for cough. Negative for chest tightness, shortness of breath and wheezing.        Cough for several years  Cardiovascular:  Negative for chest pain, palpitations and leg swelling.  Gastrointestinal:  Negative for abdominal pain, diarrhea, nausea and vomiting.  Endocrine: Negative.   Genitourinary: Negative.  Negative for difficulty urinating and dysuria.  Musculoskeletal: Negative.  Negative for arthralgias and myalgias.  Skin: Negative.   Neurological:  Negative for dizziness, weakness and light-headedness.  Hematological:  Negative for adenopathy. Does not bruise/bleed easily.  Psychiatric/Behavioral: Negative.      Objective:  BP 118/68 (BP Location: Left Arm, Patient Position: Sitting, Cuff Size: Small)   Pulse (!) 54   Temp 98.4 F (36.9 C) (Oral)   Ht 5' 3"  (1.6 m)   Wt 157 lb 9.6 oz (71.5 kg)   SpO2 98%   BMI 27.92 kg/m   BP Readings from Last 3 Encounters:  08/16/22 118/68  08/02/22 (!) 94/50  03/05/22 118/72    Wt Readings from Last 3 Encounters:  08/16/22 157 lb 9.6 oz (71.5 kg)  08/02/22 155 lb 9.6 oz  (70.6 kg)  05/01/22 156 lb 14.4 oz (71.2 kg)    Physical Exam Vitals reviewed.  Constitutional:      Appearance: She is not ill-appearing.  HENT:     Mouth/Throat:     Mouth: Mucous membranes are moist.  Eyes:     General: No scleral icterus.    Conjunctiva/sclera: Conjunctivae normal.  Cardiovascular:     Rate and Rhythm: Regular rhythm. Bradycardia present.     Heart sounds: No murmur heard.    No gallop.  Pulmonary:     Effort: Pulmonary effort is normal.     Breath sounds: No stridor. No wheezing, rhonchi or rales.  Abdominal:     General: Abdomen is flat.     Palpations: There is no mass.     Tenderness: There is no abdominal tenderness. There is no guarding.     Hernia: No hernia is present.  Musculoskeletal:        General: Normal range of motion.     Cervical back: Neck supple.     Right lower leg: No edema.     Left lower leg: No edema.  Lymphadenopathy:     Cervical: No cervical adenopathy.  Skin:    General: Skin is warm and dry.  Neurological:     General: No focal deficit present.     Mental Status: She is alert.  Psychiatric:        Mood and Affect: Mood normal.        Behavior: Behavior normal.     Lab Results  Component Value Date   WBC 5.7 08/02/2022   HGB 13.6 08/02/2022   HCT 39.1 08/02/2022   PLT 201 08/02/2022   GLUCOSE 102 (H) 08/02/2022   CHOL 143 02/13/2022   TRIG 91.0 02/13/2022   HDL 50.00 02/13/2022   LDLDIRECT 142.5 09/30/2012   LDLCALC 75 02/13/2022   ALT 18 02/13/2022   AST 21 02/13/2022   NA 140 08/02/2022   K 3.9 08/02/2022   CL 103 08/02/2022   CREATININE 0.88 08/02/2022   BUN 14 08/02/2022   CO2 28 08/02/2022   TSH 2.60 08/16/2022   INR 1.7 (H) 03/06/2021   HGBA1C 5.8 09/10/2018    No results found.  Assessment & Plan:   Lupe was seen today for hypertension.  Diagnoses and all orders for this visit:  Essential hypertension- Her blood pressure is well controlled. -     TSH; Future -     potassium  chloride (MICRO-K) 10 MEQ CR capsule; Take 1 capsule (10 mEq total) by mouth 2 (two) times daily. -     TSH  Neurodermatitis -  Vitamin B1; Future -     Vitamin B6; Future -     Vitamin B6 -     Vitamin B1  Hypokalemia -     potassium chloride (MICRO-K) 10 MEQ CR capsule; Take 1 capsule (10 mEq total) by mouth 2 (two) times daily.  Chronic cough -     chlorpheniramine-HYDROcodone (TUSSIONEX) 10-8 MG/5ML; Take 5 mLs by mouth every 12 (twelve) hours as needed for cough.  Need for vaccination -     Flu Vaccine QUAD High Dose(Fluad)   I have changed Dosia Cavallero's potassium chloride. I am also having her start on chlorpheniramine-HYDROcodone. Additionally, I am having her maintain her tretinoin, Multiple Vitamins-Minerals (PRESERVISION AREDS 2 PO), Probiotic Product (PROBIOTIC DAILY PO), brimonidine-timolol, loratadine, Vitamin D3, mirtazapine, docusate sodium, esomeprazole, Co Q 10, Ascorbic Acid (VITAMIN C PO), calcium citrate, MINOXIDIL FOR MEN EX, gabapentin, Multiple Vitamins-Minerals (CENTRUM SILVER PO), tiZANidine, dicyclomine, diltiazem, LYCOPENE PO, traMADol, telmisartan-hydrochlorothiazide, Xarelto, and pravastatin.  Meds ordered this encounter  Medications   potassium chloride (MICRO-K) 10 MEQ CR capsule    Sig: Take 1 capsule (10 mEq total) by mouth 2 (two) times daily.    Dispense:  180 capsule    Refill:  1   chlorpheniramine-HYDROcodone (TUSSIONEX) 10-8 MG/5ML    Sig: Take 5 mLs by mouth every 12 (twelve) hours as needed for cough.    Dispense:  115 mL    Refill:  0     Follow-up: Return in about 6 months (around 02/15/2023).  Scarlette Calico, MD

## 2022-08-21 LAB — VITAMIN B1: Vitamin B1 (Thiamine): 19 nmol/L (ref 8–30)

## 2022-08-21 LAB — VITAMIN B6: Vitamin B6: 16.5 ng/mL (ref 2.1–21.7)

## 2022-08-23 DIAGNOSIS — R3915 Urgency of urination: Secondary | ICD-10-CM | POA: Diagnosis not present

## 2022-08-28 DIAGNOSIS — Z09 Encounter for follow-up examination after completed treatment for conditions other than malignant neoplasm: Secondary | ICD-10-CM | POA: Diagnosis not present

## 2022-08-28 DIAGNOSIS — Z9189 Other specified personal risk factors, not elsewhere classified: Secondary | ICD-10-CM | POA: Diagnosis not present

## 2022-08-28 DIAGNOSIS — Z803 Family history of malignant neoplasm of breast: Secondary | ICD-10-CM | POA: Diagnosis not present

## 2022-08-28 DIAGNOSIS — Z79899 Other long term (current) drug therapy: Secondary | ICD-10-CM | POA: Diagnosis not present

## 2022-08-28 DIAGNOSIS — Z1231 Encounter for screening mammogram for malignant neoplasm of breast: Secondary | ICD-10-CM | POA: Diagnosis not present

## 2022-08-28 DIAGNOSIS — Z7901 Long term (current) use of anticoagulants: Secondary | ICD-10-CM | POA: Diagnosis not present

## 2022-08-28 DIAGNOSIS — Z8572 Personal history of non-Hodgkin lymphomas: Secondary | ICD-10-CM | POA: Diagnosis not present

## 2022-08-28 DIAGNOSIS — R928 Other abnormal and inconclusive findings on diagnostic imaging of breast: Secondary | ICD-10-CM | POA: Diagnosis not present

## 2022-08-29 DIAGNOSIS — L814 Other melanin hyperpigmentation: Secondary | ICD-10-CM | POA: Diagnosis not present

## 2022-08-29 DIAGNOSIS — D1801 Hemangioma of skin and subcutaneous tissue: Secondary | ICD-10-CM | POA: Diagnosis not present

## 2022-08-29 DIAGNOSIS — I781 Nevus, non-neoplastic: Secondary | ICD-10-CM | POA: Diagnosis not present

## 2022-08-29 DIAGNOSIS — L219 Seborrheic dermatitis, unspecified: Secondary | ICD-10-CM | POA: Diagnosis not present

## 2022-08-29 DIAGNOSIS — I83813 Varicose veins of bilateral lower extremities with pain: Secondary | ICD-10-CM | POA: Diagnosis not present

## 2022-08-29 DIAGNOSIS — D692 Other nonthrombocytopenic purpura: Secondary | ICD-10-CM | POA: Diagnosis not present

## 2022-08-29 DIAGNOSIS — M79605 Pain in left leg: Secondary | ICD-10-CM | POA: Diagnosis not present

## 2022-08-29 DIAGNOSIS — L299 Pruritus, unspecified: Secondary | ICD-10-CM | POA: Diagnosis not present

## 2022-08-29 DIAGNOSIS — L821 Other seborrheic keratosis: Secondary | ICD-10-CM | POA: Diagnosis not present

## 2022-08-29 DIAGNOSIS — M79604 Pain in right leg: Secondary | ICD-10-CM | POA: Diagnosis not present

## 2022-08-29 DIAGNOSIS — R208 Other disturbances of skin sensation: Secondary | ICD-10-CM | POA: Diagnosis not present

## 2022-09-02 DIAGNOSIS — Z23 Encounter for immunization: Secondary | ICD-10-CM | POA: Diagnosis not present

## 2022-09-05 ENCOUNTER — Encounter: Payer: Self-pay | Admitting: Cardiology

## 2022-09-05 ENCOUNTER — Ambulatory Visit: Payer: Medicare Other | Attending: Cardiology | Admitting: Cardiology

## 2022-09-05 VITALS — BP 110/54 | HR 71 | Ht 63.0 in | Wt 158.8 lb

## 2022-09-05 DIAGNOSIS — I10 Essential (primary) hypertension: Secondary | ICD-10-CM | POA: Diagnosis not present

## 2022-09-05 DIAGNOSIS — I480 Paroxysmal atrial fibrillation: Secondary | ICD-10-CM

## 2022-09-05 DIAGNOSIS — G4733 Obstructive sleep apnea (adult) (pediatric): Secondary | ICD-10-CM | POA: Diagnosis not present

## 2022-09-05 NOTE — Progress Notes (Signed)
Date:  09/05/2022   ID:  Colleen White, DOB [birth year], MRN [id number]   PCP:  Janith Lima, MD  Cardiologist:  [name], MD Sleep Medicine:  Fransico Him, MD Electrophysiologist:  None   Chief Complaint:  OSA  History of Present Illness:    Colleen White is a 77 y.o. female with a hx of mild OSA with an AHI of 9.1/hr and is on CPAP at 7cm H2O.   At her last office that she wanted a new device because hers was old and she is now back for follow-up per insurance requirements to document compliance after getting a new device.She is doing well with her PAP device and thinks that she has gotten used to it.  She tolerates the mask and feels the pressure is adequate.  Since going on PAP she feels rested in the am and has no significant daytime sleepiness.  She has problems with mouth dryness but no nasal dryness or nasal congestion.  She does not think that he snores.    Prior CV studies:   The following studies were reviewed today:  PAP compliance download  Past Medical History:  Diagnosis Date   Allergic rhinitis, cause unspecified    Alopecia    Anxiety    Colon polyps    Diverticulosis of colon (without mention of hemorrhage)    Fibrocystic breast disease    GERD (gastroesophageal reflux disease)    Glaucoma    Headache(784.0)    Hemorrhoids    History of echocardiogram    Echo 3/17:  Vigorous LVF, EF 65-70%, normal wall motion, grade 1 diastolic, mildly elevated LVOT velocity of 2.2 m/s-likely related to vigorous LVF   History of stress test    Myoview normal in 2/05. //  Stress echo (11/15) with no evidence for ischemia or infarction.   HLD (hyperlipidemia)    HTN (hypertension)    Hypertension    Mitral valve disorders(424.0)    Non Hodgkin's lymphoma (HCC)    Treated at [facility] with surgery and XRT.  Did not have chemotherapy.    OSA (obstructive sleep apnea) 03/29/2016   Osteopenia    PAF (paroxysmal atrial fibrillation) ([place])    a. CHADS2-VASc=3 //  b.  Xarelto started in 2017   Pulmonary nodule    Presumed benign after observation.    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 Cloyd Stagers, [facility]   s/p ORIF prox phalanx  02/2007   right little finger----Dr Fredna Dow     Current Meds  Medication Sig   Ascorbic Acid (VITAMIN C PO) Take 500 mg by mouth daily.   brimonidine-timolol (COMBIGAN) 0.2-0.5 % ophthalmic solution Place 1 drop into both eyes every 12 (twelve) hours.    calcium citrate (CALCITRATE - DOSED IN MG ELEMENTAL CALCIUM) 950 (200 Ca) MG tablet Take 200 mg of elemental calcium by mouth 2 (two) times daily.   chlorpheniramine-HYDROcodone (TUSSIONEX) 10-8 MG/5ML Take 5 mLs by mouth every 12 (twelve) hours as needed for cough.   Cholecalciferol (VITAMIN D3) 1000 units CAPS Take 1,000 Units by mouth.  Coenzyme Q10 (CO Q 10) 100 MG CAPS Take 1 tablet by mouth daily.    dicyclomine (BENTYL) 10 MG capsule Take 10 mg by mouth as needed for spasms.   diltiazem (CARDIZEM CD) 120 MG 24 hr capsule TAKE ONE CAPSULE BY MOUTH DAILY   docusate sodium (COLACE) 100 MG capsule Take 200 mg by mouth as needed.   esomeprazole (NEXIUM) 20 MG packet Take 20 mg by mouth at bedtime. Takes as needed.   fluocinonide (LIDEX) 0.05 % external solution Apply 1 Application topically as directed. Itchy Scalp   gabapentin (NEURONTIN) 100 MG capsule Take 3 capsules (300 mg total) by mouth at bedtime.   loratadine (CLARITIN) 10 MG tablet Take by mouth daily as needed.    MINOXIDIL FOR MEN EX Apply topically. Takes as needed at night.   mirtazapine (REMERON) 15 MG tablet Take 7.5 mg by mouth as needed. Takes 1/4 tablet at bedtime for itching  of scalp   Multiple Vitamins-Minerals (CENTRUM SILVER PO) Take 1 tablet by mouth daily.   Multiple Vitamins-Minerals (PRESERVISION AREDS 2 PO) Take 1 tablet by mouth 2 (two) times daily.    potassium chloride (MICRO-K) 10 MEQ CR capsule Take 1 capsule (10 mEq total) by mouth 2 (two) times daily.   pravastatin (PRAVACHOL) 40 MG tablet TAKE ONE TABLET BY MOUTH AT BEDTIME   Probiotic Product (PROBIOTIC DAILY PO) Take 1 tablet by mouth daily.   telmisartan-hydrochlorothiazide (MICARDIS HCT) 80-12.5 MG tablet TAKE ONE TABLET BY MOUTH ONCE DAILY   tiZANidine (ZANAFLEX) 4 MG tablet Take 1 tablet (4 mg total) by mouth every 8 (eight) hours as needed for muscle spasms.   traMADol (ULTRAM) 50 MG tablet TAKE ONE TABLET BY MOUTH EVERY 6 HOURS AS NEEDED   tretinoin (RETIN-A) 0.05 % cream Once daily   XARELTO 20 MG TABS tablet TAKE ONE TABLET BY MOUTH WITH SUPPER   [DISCONTINUED] LYCOPENE PO Take by mouth.     Allergies:   Ibuprofen   Social History   Tobacco Use   Smoking status: Former    Types: Cigarettes    Quit date: 08/27/1974    Years since quitting: 48.0    Passive exposure: Never   Smokeless tobacco: Never  Vaping Use   Vaping Use: Never used  Substance Use Topics   Alcohol use: No   Drug use: No     Family Hx: The patient's family history includes Breast cancer in her mother and sister; Colon cancer in her maternal uncle; Diabetes in her father; Esophageal cancer in her father; Heart disease in her father and mother; Hypertension in her mother; Prostate cancer in her father.  ROS:   Please see the history of present illness.     All other systems reviewed and are negative.   Labs/Other Tests and Data Reviewed:    Recent Labs: 02/13/2022: ALT 18 08/02/2022: BUN 14; Creatinine, Ser 0.88; Hemoglobin 13.6; Platelets 201; Potassium 3.9; Sodium 140 08/16/2022: TSH 2.60   Recent Lipid Panel Lab Results  Component Value Date/Time   CHOL 143 02/13/2022 12:20 PM   TRIG 91.0  02/13/2022 12:20 PM   HDL 50.00 02/13/2022 12:20 PM   CHOLHDL 3 02/13/2022 12:20 PM   LDLCALC 75 02/13/2022 12:20 PM   LDLDIRECT 142.5 09/30/2012 11:10 AM    Wt Readings from Last 3 Encounters:  09/05/22 158 lb 12.8 oz (72 kg)  08/16/22 157 lb 9.6 oz (71.5 kg)  08/02/22 155 lb 9.6 oz (70.6 kg)     Objective:  Vital Signs:  BP (!) 110/54   Pulse 71   Ht '5\' 3"'$  (1.6 m)   Wt 158 lb 12.8 oz (72 kg)   SpO2 97%   BMI 28.13 kg/m    GEN: Well nourished, well developed in no acute distress HEENT: Normal NECK: No JVD; No carotid bruits LYMPHATICS: No lymphadenopathy CARDIAC:RRR, no murmurs, rubs, gallops RESPIRATORY:  Clear to auscultation without rales, wheezing or rhonchi  ABDOMEN: Soft, non-tender, non-distended MUSCULOSKELETAL:  No edema; No deformity  SKIN: Warm and dry NEUROLOGIC:  Alert and oriented x 3 PSYCHIATRIC:  Normal affect  ASSESSMENT & PLAN:    1. OSA - The patient is tolerating PAP therapy well without any problems. The PAP download performed by his DME was personally reviewed and interpreted by me today and showed an AHI of 1.2 /hr on 7 cm H2O with 100% compliance in using more than 4 hours nightly.  The patient has been using and benefiting from PAP use and will continue to benefit from therapy.  -she has mouth dryness but does not want to use a chin strap   2.  HTN -BP is well-controlled on exam today -Continue drug management with diltiazem CD1 120 mg daily, telmisartan HCT 80-12.5 mg daily with as needed refills  3.  PAF -She remains in normal sinus rhythm.  She denies any recent palpitations. -She has not had any bleeding issues on Xarelto -Continue prescription drug Xarelto 20 mg daily Cardizem CD 120 mg daily with as needed refills -I have personally reviewed and interpreted outside labs performed by patient's PCP which showed serum creatinine 0.88 and potassium 3.9 on 08/02/2022.  Hemoglobin was 13.6.   Medication Adjustments/Labs and Tests  Ordered: Current medicines are reviewed at length with the patient today.  Concerns regarding medicines are outlined above.  Tests Ordered: No orders of the defined types were placed in this encounter.   Medication Changes: No orders of the defined types were placed in this encounter.    Disposition:  Follow up in 1 year(s)  Signed, Fransico Him, MD  09/05/2022 3:32 PM    [facility]

## 2022-09-05 NOTE — Patient Instructions (Signed)
Medication Instructions:  Your physician recommends that you continue on your current medications as directed. Please refer to the Current Medication list given to you today.  *If you need a refill on your cardiac medications before your next appointment, please call your pharmacy*  Lab Work: NONE  Testing/Procedures: NONE  Follow-Up: At [facility], you and your health needs are our priority.  As part of our continuing mission to provide you with exceptional heart care, we have created designated Provider Care Teams.  These Care Teams include your primary Cardiologist (physician) and Advanced Practice Providers (APPs -  Physician Assistants and Nurse Practitioners) who all work together to provide you with the care you need, when you need it.  Your next appointment:   1 year(s)  Provider:   Fransico Him, MD

## 2022-09-12 DIAGNOSIS — R3915 Urgency of urination: Secondary | ICD-10-CM | POA: Diagnosis not present

## 2022-09-19 ENCOUNTER — Other Ambulatory Visit: Payer: Self-pay | Admitting: Internal Medicine

## 2022-09-19 DIAGNOSIS — M159 Polyosteoarthritis, unspecified: Secondary | ICD-10-CM

## 2022-09-24 ENCOUNTER — Encounter (HOSPITAL_BASED_OUTPATIENT_CLINIC_OR_DEPARTMENT_OTHER): Payer: Self-pay

## 2022-09-24 ENCOUNTER — Emergency Department (HOSPITAL_BASED_OUTPATIENT_CLINIC_OR_DEPARTMENT_OTHER): Payer: Medicare Other

## 2022-09-24 ENCOUNTER — Emergency Department (HOSPITAL_BASED_OUTPATIENT_CLINIC_OR_DEPARTMENT_OTHER)
Admission: EM | Admit: 2022-09-24 | Discharge: 2022-09-24 | Disposition: A | Discharge status: Home / Self Care | Payer: Medicare Other | Attending: Emergency Medicine | Admitting: Emergency Medicine

## 2022-09-24 ENCOUNTER — Other Ambulatory Visit: Payer: Self-pay

## 2022-09-24 DIAGNOSIS — M542 Cervicalgia: Secondary | ICD-10-CM | POA: Diagnosis not present

## 2022-09-24 DIAGNOSIS — I10 Essential (primary) hypertension: Secondary | ICD-10-CM | POA: Diagnosis not present

## 2022-09-24 DIAGNOSIS — R519 Headache, unspecified: Secondary | ICD-10-CM | POA: Diagnosis not present

## 2022-09-24 DIAGNOSIS — R030 Elevated blood-pressure reading, without diagnosis of hypertension: Secondary | ICD-10-CM

## 2022-09-24 LAB — CBC WITH DIFFERENTIAL/PLATELET
Abs Immature Granulocytes: 0.02 10*3/uL (ref 0.00–0.07)
Basophils Absolute: 0.1 10*3/uL (ref 0.0–0.1)
Basophils Relative: 1 %
Eosinophils Absolute: 0.1 10*3/uL (ref 0.0–0.5)
Eosinophils Relative: 2 %
HCT: 39.8 % (ref 36.0–46.0)
Hemoglobin: 13.8 g/dL (ref 12.0–15.0)
Immature Granulocytes: 0 %
Lymphocytes Relative: 35 %
Lymphs Abs: 2.3 10*3/uL (ref 0.7–4.0)
MCH: 31.4 pg (ref 26.0–34.0)
MCHC: 34.7 g/dL (ref 30.0–36.0)
MCV: 90.5 fL (ref 80.0–100.0)
Monocytes Absolute: 0.6 10*3/uL (ref 0.1–1.0)
Monocytes Relative: 9 %
Neutro Abs: 3.5 10*3/uL (ref 1.7–7.7)
Neutrophils Relative %: 53 %
Platelets: 209 10*3/uL (ref 150–400)
RBC: 4.4 MIL/uL (ref 3.87–5.11)
RDW: 11.9 % (ref 11.5–15.5)
WBC: 6.6 10*3/uL (ref 4.0–10.5)
nRBC: 0 % (ref 0.0–0.2)

## 2022-09-24 LAB — COMPREHENSIVE METABOLIC PANEL
ALT: 26 U/L (ref 0–44)
AST: 29 U/L (ref 15–41)
Albumin: 4.2 g/dL (ref 3.5–5.0)
Alkaline Phosphatase: 98 U/L (ref 38–126)
Anion gap: 9 (ref 5–15)
BUN: 13 mg/dL (ref 8–23)
CO2: 30 mmol/L (ref 22–32)
Calcium: 9.4 mg/dL (ref 8.9–10.3)
Chloride: 99 mmol/L (ref 98–111)
Creatinine, Ser: 0.85 mg/dL (ref 0.44–1.00)
GFR, Estimated: >60 mL/min (ref >60)
Glucose, Bld: 92 mg/dL (ref 70–99)
Potassium: 3.7 mmol/L (ref 3.5–5.1)
Sodium: 138 mmol/L (ref 135–145)
Total Bilirubin: 0.4 mg/dL (ref 0.3–1.2)
Total Protein: 7.4 g/dL (ref 6.5–8.1)

## 2022-09-24 MED ORDER — IOHEXOL 350 MG/ML SOLN
75.0000 mL | Freq: Once | INTRAVENOUS | Status: AC | PRN
  Administered 2022-09-24: 75 mL via INTRAVENOUS

## 2022-09-24 NOTE — Discharge Instructions (Addendum)
It was our pleasure to provide your ER care today - we hope that you feel better. Your lab work is good/normal. Your ct scan was read as being normal.   Take acetaminophen or ibuprofen as need for pain.  You may also take your ultram as need.   Follow up with primary care doctor in one week if symptoms fail to improve/resolve. Your blood pressure is mildly high today - follow up with your doctor.   Return to ER if worse, new symptoms, severe pain, severe headache, swelling/redness to area, numbness/weakness, or other emergency concern.

## 2022-09-24 NOTE — ED Provider Notes (Signed)
[facility] [facility] Provider Note   CSN: [id number] Arrival date & time: 09/24/22  1626     History  Chief Complaint  Patient presents with   Neck Pain    Colleen White is a 77 y.o. female.  Pt c/o pain to left side of neck (left lateral/posterior)  in past two days. Symptoms acute onset, sharp, moderate, non radiating, without specific exacerbating or alleviating factors. Occurred yesterday at rest. Relieved w home meds. Recurred today so came to get checked. No radicular/arm pain. No numbness/weakness. No midline/spine pain. No headache/head pain. No chest pain or discomfort. No associated sob, nv or diaphoresis.  Denies neck injury or strain. Note made of hx ddd. No rash, redness or swelling to area of pain.   The history is provided by the patient and medical records.  Neck Pain Associated symptoms: no chest pain, no fever, no headaches, no numbness and no weakness        Home Medications Prior to Admission medications   Medication Sig Start Date End Date Taking? Authorizing Provider  Ascorbic Acid (VITAMIN C PO) Take 500 mg by mouth daily.    [provider]  brimonidine-timolol (COMBIGAN) 0.2-0.5 % ophthalmic solution Place 1 drop into both eyes every 12 (twelve) hours.     [provider]  calcium citrate (CALCITRATE - DOSED IN MG ELEMENTAL CALCIUM) 950 (200 Ca) MG tablet Take 200 mg of elemental calcium by mouth 2 (two) times daily.    [provider]  chlorpheniramine-HYDROcodone (TUSSIONEX) 10-8 MG/5ML Take 5 mLs by mouth every 12 (twelve) hours as needed for cough. 08/16/22   Janith Lima, MD  Cholecalciferol (VITAMIN D3) 1000 units CAPS Take 1,000 Units by mouth.     [provider]  Coenzyme Q10 (CO Q 10) 100 MG CAPS Take 1 tablet by mouth daily.     [provider]  dicyclomine (BENTYL) 10 MG capsule Take 10 mg by mouth as needed for spasms.    [provider]  diltiazem  (CARDIZEM CD) 120 MG 24 hr capsule TAKE ONE CAPSULE BY MOUTH DAILY 03/12/22   Larey Dresser, MD  docusate sodium (COLACE) 100 MG capsule Take 200 mg by mouth as needed.    [provider]  esomeprazole (NEXIUM) 20 MG packet Take 20 mg by mouth at bedtime. Takes as needed.    [provider]  fluocinonide (LIDEX) 0.05 % external solution Apply 1 Application topically as directed. Itchy Scalp 08/29/22   [provider]  gabapentin (NEURONTIN) 100 MG capsule Take 3 capsules (300 mg total) by mouth at bedtime. 09/05/20   Janith Lima, MD  loratadine (CLARITIN) 10 MG tablet Take by mouth daily as needed.     [provider]  MINOXIDIL FOR MEN EX Apply topically. Takes as needed at night.    [provider]  mirtazapine (REMERON) 15 MG tablet Take 7.5 mg by mouth as needed. Takes 1/4 tablet at bedtime for itching of scalp    [provider]  Multiple Vitamins-Minerals (CENTRUM SILVER PO) Take 1 tablet by mouth daily.    [provider]  Multiple Vitamins-Minerals (PRESERVISION AREDS 2 PO) Take 1 tablet by mouth 2 (two) times daily.     [provider]  potassium chloride (MICRO-K) 10 MEQ CR capsule Take 1 capsule (10 mEq total) by mouth 2 (two) times daily. 08/16/22   Janith Lima, MD  pravastatin (PRAVACHOL) 40 MG tablet TAKE ONE TABLET BY  MOUTH AT BEDTIME 08/01/22   Janith Lima, MD  Probiotic Product (PROBIOTIC DAILY PO) Take 1 tablet by mouth daily.    [provider]  telmisartan-hydrochlorothiazide (MICARDIS HCT) 80-12.5 MG tablet TAKE ONE TABLET BY MOUTH ONCE DAILY 06/11/22   Janith Lima, MD  tiZANidine (ZANAFLEX) 4 MG tablet Take 1 tablet (4 mg total) by mouth every 8 (eight) hours as needed for muscle spasms. 08/22/21   Janith Lima, MD  traMADol (ULTRAM) 50 MG tablet TAKE ONE TABLET BY MOUTH EVERY 6 HOURS AS NEEDED 09/19/22   Janith Lima, MD  tretinoin (RETIN-A) 0.05 % cream Once daily    [provider]  XARELTO 20 MG TABS tablet TAKE ONE TABLET BY MOUTH WITH SUPPER 07/09/22   Larey Dresser, MD      Allergies    Ibuprofen    Review of Systems   Review of Systems  Constitutional:  Negative for chills and fever.  HENT:  Negative for sore throat and trouble swallowing.   Eyes:  Negative for visual disturbance.  Respiratory:  Negative for shortness of breath.   Cardiovascular:  Negative for chest pain.  Gastrointestinal:  Negative for nausea and vomiting.  Musculoskeletal:  Positive for neck pain.  Skin:  Negative for rash.  Neurological:  Negative for speech difficulty, weakness, numbness and headaches.    Physical Exam Updated Vital Signs BP (!) 150/73 (BP Location: Right Arm)   Pulse 63   Temp 98.4 F (36.9 C) (Oral)   Resp 17   SpO2 98%  Physical Exam Vitals and nursing note reviewed.  Constitutional:      Appearance: Normal appearance. She is well-developed.  HENT:     Head: Atraumatic.     Nose: Nose normal.     Mouth/Throat:     Mouth: Mucous membranes are moist.  Eyes:     General: No scleral icterus.    Conjunctiva/sclera: Conjunctivae normal.  Neck:     Vascular: No carotid bruit.     Trachea: No tracheal deviation.     Comments: Normal rom without pain. No sts or skin changes noted.  Cardiovascular:     Rate and Rhythm: Normal rate and regular rhythm.     Pulses: Normal pulses.     Heart sounds: Normal heart sounds. No murmur heard.    No friction rub. No gallop.  Pulmonary:     Effort: Pulmonary effort is normal. No respiratory distress.     Breath sounds: Normal breath sounds.  Musculoskeletal:        General: No swelling.     Cervical back: Normal range of motion and neck supple. No rigidity or tenderness. No muscular tenderness.     Comments: C spine non tender, aligned, no step off. Normal rom.   Lymphadenopathy:     Cervical: No cervical adenopathy.  Skin:    General: Skin is warm and dry.     Findings: No rash.   Neurological:     Mental Status: She is alert.     Comments: Alert, speech normal. Motor/sens grossly intact bil. Steady gait.   Psychiatric:        Mood and Affect: Mood normal.     ED Results / Procedures / Treatments   Labs (all labs ordered are listed, but only abnormal results are displayed) Results for orders placed or performed during the hospital encounter of 09/24/22  CBC with Differential  Result Value Ref Range   WBC 6.6 4.0 - 10.5 K/uL  RBC 4.40 3.87 - 5.11 MIL/uL   Hemoglobin 13.8 12.0 - 15.0 g/dL   HCT 39.8 36.0 - 46.0 %   MCV 90.5 80.0 - 100.0 fL   MCH 31.4 26.0 - 34.0 pg   MCHC 34.7 30.0 - 36.0 g/dL   RDW 11.9 11.5 - 15.5 %   Platelets 209 150 - 400 K/uL   nRBC 0.0 0.0 - 0.2 %   Neutrophils Relative % 53 %   Neutro Abs 3.5 1.7 - 7.7 K/uL   Lymphocytes Relative 35 %   Lymphs Abs 2.3 0.7 - 4.0 K/uL   Monocytes Relative 9 %   Monocytes Absolute 0.6 0.1 - 1.0 K/uL   Eosinophils Relative 2 %   Eosinophils Absolute 0.1 0.0 - 0.5 K/uL   Basophils Relative 1 %   Basophils Absolute 0.1 0.0 - 0.1 K/uL   Immature Granulocytes 0 %   Abs Immature Granulocytes 0.02 0.00 - 0.07 K/uL  Comprehensive metabolic panel  Result Value Ref Range   Sodium 138 135 - 145 mmol/L   Potassium 3.7 3.5 - 5.1 mmol/L   Chloride 99 98 - 111 mmol/L   CO2 30 22 - 32 mmol/L   Glucose, Bld 92 70 - 99 mg/dL   BUN 13 8 - 23 mg/dL   Creatinine, Ser 0.85 0.44 - 1.00 mg/dL   Calcium 9.4 8.9 - 10.3 mg/dL   Total Protein 7.4 6.5 - 8.1 g/dL   Albumin 4.2 3.5 - 5.0 g/dL   AST 29 15 - 41 U/L   ALT 26 0 - 44 U/L   Alkaline Phosphatase 98 38 - 126 U/L   Total Bilirubin 0.4 0.3 - 1.2 mg/dL   GFR, Estimated >60 >60 mL/min   Anion gap 9 5 - 15     EKG None  Radiology CT ANGIO HEAD NECK W WO CM  Result Date: 09/24/2022 CLINICAL DATA:  Left-sided neck pain radiating to the head EXAM: CT ANGIOGRAPHY HEAD AND NECK TECHNIQUE: Multidetector CT imaging of the head and neck was performed using  the standard protocol during bolus administration of intravenous contrast. Multiplanar CT image reconstructions and MIPs were obtained to evaluate the vascular anatomy. Carotid stenosis measurements (when applicable) are obtained utilizing NASCET criteria, using the distal internal carotid diameter as the denominator. RADIATION DOSE REDUCTION: This exam was performed according to the departmental dose-optimization program which includes automated exposure control, adjustment of the mA and/or kV according to patient size and/or use of iterative reconstruction technique. CONTRAST:  6m OMNIPAQUE IOHEXOL 350 MG/ML SOLN COMPARISON:  11/11/2019 FINDINGS: CT HEAD FINDINGS Brain: There is no mass, hemorrhage or extra-axial collection. The size and configuration of the ventricles and extra-axial CSF spaces are normal. There is no acute or chronic infarction. The brain parenchyma is normal. Skull: The visualized skull base, calvarium and extracranial soft tissues are normal. Sinuses/Orbits: No fluid levels or advanced mucosal thickening of the visualized paranasal sinuses. No mastoid or middle ear effusion. The orbits are normal. CTA NECK FINDINGS SKELETON: There is no bony spinal canal stenosis. No lytic or blastic lesion. OTHER NECK: Normal pharynx, larynx and major salivary glands. No cervical lymphadenopathy. Unremarkable thyroid gland. UPPER CHEST: No pneumothorax or pleural effusion. No nodules or masses. AORTIC ARCH: There is no calcific atherosclerosis of the aortic arch. There is no aneurysm, dissection or hemodynamically significant stenosis of the visualized portion of the aorta. Normal variant aortic arch branching pattern with the left vertebral artery arising independently from the aortic arch. The visualized proximal subclavian  arteries are widely patent. RIGHT CAROTID SYSTEM: Normal without aneurysm, dissection or stenosis. LEFT CAROTID SYSTEM: Normal without aneurysm, dissection or stenosis. VERTEBRAL  ARTERIES: Right dominant configuration. Both origins are clearly patent. There is no dissection, occlusion or flow-limiting stenosis to the skull base (V1-V3 segments). CTA HEAD FINDINGS POSTERIOR CIRCULATION: --Vertebral arteries: Normal V4 segments. --Inferior cerebellar arteries: Normal. --Basilar artery: Normal. --Superior cerebellar arteries: Normal. --Posterior cerebral arteries (PCA): Normal. ANTERIOR CIRCULATION: --Intracranial internal carotid arteries: Normal. --Anterior cerebral arteries (ACA): Normal. Both A1 segments are present. Patent anterior communicating artery (a-comm). --Middle cerebral arteries (MCA): Normal. VENOUS SINUSES: As permitted by contrast timing, patent. ANATOMIC VARIANTS: None Review of the MIP images confirms the above findings. IMPRESSION: Normal CTA of the head and neck. Electronically Signed   By: Ulyses Jarred M.D.   On: 09/24/2022 20:46    Procedures Procedures    Medications Ordered in ED Medications  iohexol (OMNIPAQUE) 350 MG/ML injection 75 mL (75 mLs Intravenous Contrast Given 09/24/22 2007)    ED Course/ Medical Decision Making/ A&P                             Medical Decision Making Problems Addressed: Elevated blood pressure reading: acute illness or injury Neck pain on left side: acute illness or injury  Amount and/or Complexity of Data Reviewed External Data Reviewed: notes. Labs: ordered. Decision-making details documented in ED Course. Radiology: ordered and independent interpretation performed. Decision-making details documented in ED Course.  Risk Decision regarding hospitalization.    Labs ordered/sent. Imaging ordered. Initial workup ordered by triage provider.   Differential diagnosis includes  ddd, nerve impingement, msk pain, vascular disorder. Dispo decision including potential need for admission considered - will get labs and imaging and reassess.   Reviewed nursing notes and prior charts for additional history. External  reports reviewed.   Labs reviewed/interpreted by me - wbc normal. Chem normal.  CT reviewed/interpreted by me - no acute process.  Prior imaging also reviewed, hx ddd. ?possible nerve impingement/pain.   Pain currently resolved. Pt currently appears stable for d/c.   Rec pcp f/u.  Return precautions provided.          Final Clinical Impression(s) / ED Diagnoses Final diagnoses:  None    Rx / DC Orders ED Discharge Orders     None         Lajean Saver, MD 09/24/22 2108

## 2022-09-24 NOTE — ED Notes (Signed)
Discharge paperwork reviewed entirely with patient, including Rx's and follow up care. Pain was under control. Pt verbalized understanding as well as all parties involved. No questions or concerns voiced at the time of discharge. No acute distress noted.   Pt ambulated out to PVA without incident or assistance.  

## 2022-09-24 NOTE — ED Provider Triage Note (Signed)
Emergency Medicine Provider Triage Evaluation Note  Colleen White , a 77 y.o. female  was evaluated in triage.  Pt complains of left sided neck pain that radiates into her head. She reports she had an episode yesterday, and then had another today and was concerned. No lightheadedness or dizziness. No visual changes, or trouble walking/talking. No falls or trauma.   Review of Systems  Positive:  Negative:   Physical Exam  BP (!) 150/77   Pulse 69   Temp 98.2 F (36.8 C)   Resp 16   SpO2 100%  Gen:   Awake, no distress   Resp:  Normal effort  MSK:   Moves extremities without difficulty  Other:  Cranial nerves II-XII grossly intact. No pronator drift. Full ROM of neck. She is answering questions appropriately with appropriate speech.  Medical Decision Making  Medically screening exam initiated at 4:37 PM.  Appropriate orders placed.  Colleen White was informed that the remainder of the evaluation will be completed by another provider, this initial triage assessment does not replace that evaluation, and the importance of remaining in the ED until their evaluation is complete.  CT of head and neck ordered with basic labs.    Colleen White, Colleen White 09/24/22 1639

## 2022-09-24 NOTE — ED Triage Notes (Signed)
Pt c/o sudden-onset "sharp pain" on L side of neck "that stayed on for some time but then got better w rest," took tramadol & excedrin w "some relief." To ED for eval "just because it came back."

## 2022-12-03 ENCOUNTER — Ambulatory Visit (INDEPENDENT_AMBULATORY_CARE_PROVIDER_SITE_OTHER): Payer: Medicare Other

## 2022-12-03 VITALS — BP 118/60 | HR 56 | Temp 97.8°F | Ht 63.0 in | Wt 160.6 lb

## 2022-12-03 DIAGNOSIS — Z0000 Encounter for general adult medical examination without abnormal findings: Secondary | ICD-10-CM | POA: Diagnosis not present

## 2022-12-03 NOTE — Patient Instructions (Addendum)
Colleen White , Thank you for taking time to come for your Medicare Wellness Visit. I appreciate your ongoing commitment to your health goals. Please review the following plan we discussed and let me know if I can assist you in the future.   These are the goals we discussed:  Goals      My goal for 2024 is to stay healthy and happy.        This is a list of the screening recommended for you and due dates:  Health Maintenance  Topic Date Due   DTaP/Tdap/Td vaccine (2 - Td or Tdap) 09/30/2022   COVID-19 Vaccine (6 - 2023-24 season) 10/28/2022   Flu Shot  03/28/2023   Medicare Annual Wellness Visit  12/03/2023   Pneumonia Vaccine  Completed   DEXA scan (bone density measurement)  Completed   Hepatitis C Screening: USPSTF Recommendation to screen - Ages 78-79 yo.  Completed   Zoster (Shingles) Vaccine  Completed   HPV Vaccine  Aged Out   Colon Cancer Screening  Discontinued    Advanced directives: Yes  Conditions/risks identified: Yes  Next appointment: Follow up in one year for your annual wellness visit.   Preventive Care 68 Years and Older, Female Preventive care refers to lifestyle choices and visits with your health care provider that can promote health and wellness. What does preventive care include? A yearly physical exam. This is also called an annual well check. Dental exams once or twice a year. Routine eye exams. Ask your health care provider how often you should have your eyes checked. Personal lifestyle choices, including: Daily care of your teeth and gums. Regular physical activity. Eating a healthy diet. Avoiding tobacco and drug use. Limiting alcohol use. Practicing safe sex. Taking low-dose aspirin every day. Taking vitamin and mineral supplements as recommended by your health care provider. What happens during an annual well check? The services and screenings done by your health care provider during your annual well check will depend on your age, overall  health, lifestyle risk factors, and family history of disease. Counseling  Your health care provider may ask you questions about your: Alcohol use. Tobacco use. Drug use. Emotional well-being. Home and relationship well-being. Sexual activity. Eating habits. History of falls. Memory and ability to understand (cognition). Work and work Astronomer. Reproductive health. Screening  You may have the following tests or measurements: Height, weight, and BMI. Blood pressure. Lipid and cholesterol levels. These may be checked every 5 years, or more frequently if you are over 52 years old. Skin check. Lung cancer screening. You may have this screening every year starting at age 78 if you have a 30-pack-year history of smoking and currently smoke or have quit within the past 15 years. Fecal occult blood test (FOBT) of the stool. You may have this test every year starting at age 78. Flexible sigmoidoscopy or colonoscopy. You may have a sigmoidoscopy every 5 years or a colonoscopy every 10 years starting at age 78. Hepatitis C blood test. Hepatitis B blood test. Sexually transmitted disease (STD) testing. Diabetes screening. This is done by checking your blood sugar (glucose) after you have not eaten for a while (fasting). You may have this done every 1-3 years. Bone density scan. This is done to screen for osteoporosis. You may have this done starting at age 65. Mammogram. This may be done every 1-2 years. Talk to your health care provider about how often you should have regular mammograms. Talk with your health care provider about your  test results, treatment options, and if necessary, the need for more tests. Vaccines  Your health care provider may recommend certain vaccines, such as: Influenza vaccine. This is recommended every year. Tetanus, diphtheria, and acellular pertussis (Tdap, Td) vaccine. You may need a Td booster every 10 years. Zoster vaccine. You may need this after age  25. Pneumococcal 13-valent conjugate (PCV13) vaccine. One dose is recommended after age 66. Pneumococcal polysaccharide (PPSV23) vaccine. One dose is recommended after age 19. Talk to your health care provider about which screenings and vaccines you need and how often you need them. This information is not intended to replace advice given to you by your health care provider. Make sure you discuss any questions you have with your health care provider. Document Released: 09/09/2015 Document Revised: 05/02/2016 Document Reviewed: 06/14/2015 Elsevier Interactive Patient Education  2017 [place] Prevention in the Home Falls can cause injuries. They can happen to people of all ages. There are many things you can do to make your home safe and to help prevent falls. What can I do on the outside of my home? Regularly fix the edges of walkways and driveways and fix any cracks. Remove anything that might make you trip as you walk through a door, such as a raised step or threshold. Trim any bushes or trees on the path to your home. Use bright outdoor lighting. Clear any walking paths of anything that might make someone trip, such as rocks or tools. Regularly check to see if handrails are loose or broken. Make sure that both sides of any steps have handrails. Any raised decks and porches should have guardrails on the edges. Have any leaves, snow, or ice cleared regularly. Use sand or salt on walking paths during winter. Clean up any spills in your garage right away. This includes oil or grease spills. What can I do in the bathroom? Use night lights. Install grab bars by the toilet and in the tub and shower. Do not use towel bars as grab bars. Use non-skid mats or decals in the tub or shower. If you need to sit down in the shower, use a plastic, non-slip stool. Keep the floor dry. Clean up any water that spills on the floor as soon as it happens. Remove soap buildup in the tub or shower  regularly. Attach bath mats securely with double-sided non-slip rug tape. Do not have throw rugs and other things on the floor that can make you trip. What can I do in the bedroom? Use night lights. Make sure that you have a light by your bed that is easy to reach. Do not use any sheets or blankets that are too big for your bed. They should not hang down onto the floor. Have a firm chair that has side arms. You can use this for support while you get dressed. Do not have throw rugs and other things on the floor that can make you trip. What can I do in the kitchen? Clean up any spills right away. Avoid walking on wet floors. Keep items that you use a lot in easy-to-reach places. If you need to reach something above you, use a strong step stool that has a grab bar. Keep electrical cords out of the way. Do not use floor polish or wax that makes floors slippery. If you must use wax, use non-skid floor wax. Do not have throw rugs and other things on the floor that can make you trip. What can I do with my  stairs? Do not leave any items on the stairs. Make sure that there are handrails on both sides of the stairs and use them. Fix handrails that are broken or loose. Make sure that handrails are as long as the stairways. Check any carpeting to make sure that it is firmly attached to the stairs. Fix any carpet that is loose or worn. Avoid having throw rugs at the top or bottom of the stairs. If you do have throw rugs, attach them to the floor with carpet tape. Make sure that you have a light switch at the top of the stairs and the bottom of the stairs. If you do not have them, ask someone to add them for you. What else can I do to help prevent falls? Wear shoes that: Do not have high heels. Have rubber bottoms. Are comfortable and fit you well. Are closed at the toe. Do not wear sandals. If you use a stepladder: Make sure that it is fully opened. Do not climb a closed stepladder. Make sure that  both sides of the stepladder are locked into place. Ask someone to hold it for you, if possible. Clearly mark and make sure that you can see: Any grab bars or handrails. First and last steps. Where the edge of each step is. Use tools that help you move around (mobility aids) if they are needed. These include: Canes. Walkers. Scooters. Crutches. Turn on the lights when you go into a dark area. Replace any light bulbs as soon as they burn out. Set up your furniture so you have a clear path. Avoid moving your furniture around. If any of your floors are uneven, fix them. If there are any pets around you, be aware of where they are. Review your medicines with your doctor. Some medicines can make you feel dizzy. This can increase your chance of falling. Ask your doctor what other things that you can do to help prevent falls. This information is not intended to replace advice given to you by your health care provider. Make sure you discuss any questions you have with your health care provider. Document Released: 06/09/2009 Document Revised: 01/19/2016 Document Reviewed: 09/17/2014 Elsevier Interactive Patient Education  2017 [employer].

## 2022-12-03 NOTE — Progress Notes (Signed)
Subjective:   Colleen White is a 77 y.o. female who presents for Medicare Annual (Subsequent) preventive examination.  Review of Systems     Cardiac Risk Factors include: advanced age (>5men, >69 women);dyslipidemia;family history of premature cardiovascular disease;hypertension;sedentary lifestyle     Objective:    Today's Vitals   12/03/22 0842  BP: 118/60  Pulse: (!) 56  Temp: 97.8 F (36.6 C)  SpO2: 98%  Weight: 160 lb 9.6 oz (72.8 kg)  Height: 5\' 3"  (1.6 m)  PainSc: 0-No pain   Body mass index is 28.45 kg/m.     12/03/2022    9:11 AM 11/27/2021    8:15 AM 04/30/2021    1:51 PM 11/08/2020   10:44 AM 11/11/2019    6:06 PM 10/23/2018   10:27 AM 09/24/2018   10:15 AM  Advanced Directives  Does Patient Have a Medical Advance Directive? Yes Yes Yes Yes No No Yes  Type of Estate agent of [place];Living will Healthcare Power of [place];Living will  Living will;Healthcare Power of [employer] Power of [place];Living will  Does patient want to make changes to medical advance directive?    No - Patient declined     Copy of Healthcare Power of Attorney in Chart? No - copy requested No - copy requested  No - copy requested   No - copy requested  Would patient like information on creating a medical advance directive?     No - Patient declined No - Patient declined     Current Medications (verified) Outpatient Encounter Medications as of 12/03/2022  Medication Sig   Ascorbic Acid (VITAMIN C PO) Take 500 mg by mouth daily.   brimonidine-timolol (COMBIGAN) 0.2-0.5 % ophthalmic solution Place 1 drop into both eyes every 12 (twelve) hours.    calcium citrate (CALCITRATE - DOSED IN MG ELEMENTAL CALCIUM) 950 (200 Ca) MG tablet Take 200 mg of elemental calcium by mouth 2 (two) times daily.   chlorpheniramine-HYDROcodone (TUSSIONEX) 10-8 MG/5ML Take 5 mLs by mouth every 12 (twelve) hours as needed for cough.   Cholecalciferol (VITAMIN D3) 1000 units CAPS  Take 1,000 Units by mouth.    Coenzyme Q10 (CO Q 10) 100 MG CAPS Take 1 tablet by mouth daily.    dicyclomine (BENTYL) 10 MG capsule Take 10 mg by mouth as needed for spasms.   diltiazem (CARDIZEM CD) 120 MG 24 hr capsule TAKE ONE CAPSULE BY MOUTH DAILY   docusate sodium (COLACE) 100 MG capsule Take 200 mg by mouth as needed.   esomeprazole (NEXIUM) 20 MG packet Take 20 mg by mouth at bedtime. Takes as needed.   fluocinonide (LIDEX) 0.05 % external solution Apply 1 Application topically as directed. Itchy Scalp   gabapentin (NEURONTIN) 100 MG capsule Take 3 capsules (300 mg total) by mouth at bedtime.   loratadine (CLARITIN) 10 MG tablet Take by mouth daily as needed.    MINOXIDIL FOR MEN EX Apply topically. Takes as needed at night.   mirtazapine (REMERON) 15 MG tablet Take 7.5 mg by mouth as needed. Takes 1/4 tablet at bedtime for itching of scalp   Multiple Vitamins-Minerals (CENTRUM SILVER PO) Take 1 tablet by mouth daily.   Multiple Vitamins-Minerals (PRESERVISION AREDS 2 PO) Take 1 tablet by mouth 2 (two) times daily.    pravastatin (PRAVACHOL) 40 MG tablet TAKE ONE TABLET BY MOUTH AT BEDTIME   Probiotic Product (PROBIOTIC DAILY PO) Take 1 tablet by mouth daily.   telmisartan-hydrochlorothiazide (MICARDIS HCT) 80-12.5 MG tablet TAKE  ONE TABLET BY MOUTH ONCE DAILY   tiZANidine (ZANAFLEX) 4 MG tablet Take 1 tablet (4 mg total) by mouth every 8 (eight) hours as needed for muscle spasms.   traMADol (ULTRAM) 50 MG tablet TAKE ONE TABLET BY MOUTH EVERY 6 HOURS AS NEEDED   tretinoin (RETIN-A) 0.05 % cream Once daily   XARELTO 20 MG TABS tablet TAKE ONE TABLET BY MOUTH WITH SUPPER   potassium chloride (MICRO-K) 10 MEQ CR capsule Take 1 capsule (10 mEq total) by mouth 2 (two) times daily. (Patient not taking: Reported on 12/03/2022)   No facility-administered encounter medications on file as of 12/03/2022.    Allergies (verified) Ibuprofen   History: Past Medical History:  Diagnosis Date    Allergic rhinitis, cause unspecified    Alopecia    Anxiety    Colon polyps    Diverticulosis of colon (without mention of hemorrhage)    Fibrocystic breast disease    GERD (gastroesophageal reflux disease)    Glaucoma    Headache(784.0)    Hemorrhoids    History of echocardiogram    Echo 3/17:  Vigorous LVF, EF 65-70%, normal wall motion, grade 1 diastolic, mildly elevated LVOT velocity of 2.2 m/s-likely related to vigorous LVF   History of stress test    Myoview normal in 2/05. //  Stress echo (11/15) with no evidence for ischemia or infarction.   HLD (hyperlipidemia)    HTN (hypertension)    Hypertension    Mitral valve disorders(424.0)    Non Hodgkin's lymphoma    [facility].  Did not have chemotherapy.    OSA (obstructive sleep apnea) 03/29/2016   Osteopenia    PAF (paroxysmal atrial fibrillation)    a. CHADS2-VASc=3 //  b. Xarelto started in 2017   Pulmonary nodule    Presumed benign after observation.    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   s/p ORIF prox phalanx  02/2007   right little finger----Dr Merlyn LotKuzma   Family History  Problem Relation Age of Onset   Hypertension Mother    Breast cancer Mother        double mastectomy   Heart disease Mother    Diabetes Father    Heart disease Father    Prostate cancer Father    Esophageal cancer Father    Breast cancer Sister        breast (sister), spread to brain   Colon cancer Maternal Uncle    Social History   Socioeconomic History   Marital status: Married    Spouse name: Not on file   Number of children: 0   Years of education: Not on file    Highest education level: Not on file  Occupational History   Not on file  Tobacco Use   Smoking status: Former    Types: Cigarettes    Quit date: 08/27/1974    Years since quitting: 48.3    Passive exposure: Never   Smokeless tobacco: Never  Vaping Use   Vaping Use: Never used  Substance  and Sexual Activity   Alcohol use: No   Drug use: No   Sexual activity: Yes  Other Topics Concern   Not on file  Social History Narrative   Lives at home with spouse   Retired   Caffeine: 3-4 cups/day   Social Determinants of Health   Financial Resource Strain: Low Risk  (12/03/2022)   Overall Financial Resource Strain (CARDIA)    Difficulty of Paying Living Expenses: Not hard at all  Food Insecurity: No Food Insecurity (12/03/2022)   Hunger Vital Sign    Worried About Running Out of Food in the Last Year: Never true    Ran Out of Food in the Last Year: Never true  Transportation Needs: No Transportation Needs (12/03/2022)   PRAPARE - Administrator, Civil Service (Medical): No    Lack of Transportation (Non-Medical): No  Physical Activity: Inactive (12/03/2022)   Exercise Vital Sign    Days of Exercise per Week: 0 days    Minutes of Exercise per Session: 0 min  Stress: No Stress Concern Present (12/03/2022)   [employer] of Occupational Health - Occupational Stress Questionnaire    Feeling of Stress : Not at all  Social Connections: Moderately Integrated (12/03/2022)   Social Connection and Isolation Panel [NHANES]    Frequency of Communication with Friends and Family: Twice a week    Frequency of Social Gatherings with Friends and Family: Twice a week    Attends Religious Services: More than 4 times per year    Active Member of [employer] or Organizations: No    Attends Engineer, structural: Never    Marital Status: Married    Tobacco Counseling Counseling given: Not Answered   Clinical Intake:  Pre-visit preparation completed: Yes  Pain : No/denies pain Pain  Score: 0-No pain     Nutritional Risks: None Diabetes: No  How often do you need to have someone help you when you read instructions, pamphlets, or other written materials from your doctor or pharmacy?: 1 - Never What is the last grade level you completed in school?: HSG  Diabetic? No  Interpreter Needed?: No  Information entered by :: Susie Cassette, LPN.   Activities of Daily Living    12/03/2022    9:12 AM  In your present state of health, do you have any difficulty performing the following activities:  Hearing? 0  Vision? 0  Difficulty concentrating or making decisions? 0  Walking or climbing stairs? 0  Dressing or bathing? 0  Doing errands, shopping? 0  Preparing Food and eating ? N  Using the Toilet? N  In the past six months, have you accidently leaked urine? N  Do you have problems with loss of bowel control? N  Managing your Medications? N  Managing your Finances? N  Housekeeping or managing your Housekeeping? N    Patient Care Team: Etta Grandchild, MD as PCP - General (Internal Medicine) Quintella Reichert, MD as PCP - Sleep Medicine (Sleep Medicine) Laurey Morale, MD as PCP - Cardiology (Cardiology) Maris Berger, MD as Consulting Physician (Ophthalmology) Szabat, Vinnie Level, [facility] (Inactive) as Pharmacist (Pharmacist)  Indicate any recent Medical Services you may have received from other than [facility] providers in the past year (date may be approximate).     Assessment:   This is a routine wellness examination for [place].  Hearing/Vision screen Hearing Screening - Comments:: Denies hearing difficulties   Vision Screening - Comments:: Wears rx glasses - up to date with  routine eye exams with Maris Berger, MD.   Dietary issues and exercise activities discussed: Current Exercise Habits: The patient does not participate in regular exercise at present, Exercise limited by: cardiac condition(s)   Goals Addressed             This Visit's  Progress    My goal for 2024 is to stay healthy and happy.        Depression Screen    12/03/2022    9:10 AM 08/16/2022    9:41 AM 03/05/2022    2:59 PM 11/27/2021    8:16 AM 11/27/2021    8:14 AM 03/06/2021   10:01 AM 11/08/2020    2:31 PM  PHQ 2/9 Scores  PHQ - 2 Score 0 0 0 0 0 0 0  PHQ- 9 Score 0  1   1     Fall Risk    12/03/2022    9:12 AM 08/16/2022    9:41 AM 03/05/2022    2:58 PM 11/27/2021    8:16 AM 11/08/2020    2:33 PM  Fall Risk   Falls in the past year? 0 1 1 0 0  Number falls in past yr: 0 0 1 0 0  Injury with Fall? 0 0 0 0 0  Risk for fall due to : No Fall Risks No Fall Risks   No Fall Risks  Follow up Falls prevention discussed Falls evaluation completed  Falls evaluation completed Falls evaluation completed    FALL RISK PREVENTION PERTAINING TO THE HOME:  Any stairs in or around the home? Yes  If so, are there any without handrails? No  Home free of loose throw rugs in walkways, pet beds, electrical cords, etc? Yes  Adequate lighting in your home to reduce risk of falls? Yes   ASSISTIVE DEVICES UTILIZED TO PREVENT FALLS:  Life alert? No  Use of a cane, walker or w/c? No  Grab bars in the bathroom? Yes  Shower chair or bench in shower? Yes  Elevated toilet seat or a handicapped toilet? Yes   TIMED UP AND GO:  Was the test performed? Yes .  Length of time to ambulate 10 feet: 8 sec.   Gait steady and fast without use of assistive device  Cognitive Function:        12/03/2022    8:43 AM  6CIT Screen  What Year? 0 points  What month? 0 points  What time? 0 points  Count back from 20 0 points  Months in reverse 0 points  Repeat phrase 0 points  Total Score 0 points    Immunizations Immunization History  Administered Date(s) Administered   Fluad Quad(high Dose 65+) 04/20/2019, 08/01/2020, 07/12/2021, 08/16/2022   Influenza Split 09/05/2011, 07/30/2012   Influenza, High Dose Seasonal PF 10/11/2015, 09/11/2016, 06/25/2017, 06/19/2018    Influenza,inj,Quad PF,6+ Mos 08/26/2014   Influenza-Unspecified 06/27/2013   PFIZER Comirnaty(Gray Top)Covid-19 Tri-Sucrose Vaccine 09/02/2022   PFIZER(Purple Top)SARS-COV-2 Vaccination 09/12/2019, 10/03/2019, 06/13/2020, 01/03/2021   Pneumococcal Conjugate-13 12/15/2014   Pneumococcal Polysaccharide-23 12/21/2015, 02/13/2022   RSV,unspecified 09/09/2022   Tdap 09/30/2012   Zoster Recombinat (Shingrix) 09/03/2017, 11/04/2017   Zoster, Live 09/30/2009    TDAP status: Due, Education has been provided regarding the importance of this vaccine. Advised may receive this vaccine at local pharmacy or Health Dept. Aware to provide a copy of the vaccination record if obtained from local pharmacy or Health Dept. Verbalized acceptance and understanding.  Flu Vaccine status: Up to date  Pneumococcal vaccine status: Up  to date  Covid-19 vaccine status: Completed vaccines  Qualifies for Shingles Vaccine? Yes   Zostavax completed Yes   Shingrix Completed?: Yes  Screening Tests Health Maintenance  Topic Date Due   DTaP/Tdap/Td (2 - Td or Tdap) 09/30/2022   COVID-19 Vaccine (6 - 2023-24 season) 10/28/2022   INFLUENZA VACCINE  03/28/2023   Medicare Annual Wellness (AWV)  12/03/2023   Pneumonia Vaccine 82+ Years old  Completed   DEXA SCAN  Completed   Hepatitis C Screening  Completed   Zoster Vaccines- Shingrix  Completed   HPV VACCINES  Aged Out   COLONOSCOPY (Pts 45-31yrs Insurance coverage will need to be confirmed)  Discontinued    Health Maintenance  Health Maintenance Due  Topic Date Due   DTaP/Tdap/Td (2 - Td or Tdap) 09/30/2022   COVID-19 Vaccine (6 - 2023-24 season) 10/28/2022    Colorectal cancer screening: No longer required.   Mammogram status: Completed 02/20/2022. Repeat every year Completed at [facility] (every 6 months)  Bone Density status: Completed 07/27/2021. Results reflect: Bone density results: OSTEOPENIA. Repeat every 2-3 years.  Lung Cancer Screening:  (Low Dose CT Chest recommended if Age 76-80 years, 30 pack-year currently smoking OR have quit w/in 15years.) does not qualify.   Lung Cancer Screening Referral: no  Additional Screening:  Hepatitis C Screening: does qualify; Completed 12/24/2016  Vision Screening: Recommended annual ophthalmology exams for early detection of glaucoma and other disorders of the eye. Is the patient up to date with their annual eye exam?  Yes  Who is the provider or what is the name of the office in which the patient attends annual eye exams? Maris Berger, MD. If pt is not established with a provider, would they like to be referred to a provider to establish care? No .   Dental Screening: Recommended annual dental exams for proper oral hygiene  Community Resource Referral / Chronic Care Management: CRR required this visit?  No   CCM required this visit?  No      Plan:     I have personally reviewed and noted the following in the patient's chart:   Medical and social history Use of alcohol, tobacco or illicit drugs  Current medications and supplements including opioid prescriptions. Patient is not currently taking opioid prescriptions. Functional ability and status Nutritional status Physical activity Advanced directives List of other physicians Hospitalizations, surgeries, and ER visits in previous 12 months Vitals Screenings to include cognitive, depression, and falls Referrals and appointments  In addition, I have reviewed and discussed with patient certain preventive protocols, quality metrics, and best practice recommendations. A written personalized care plan for preventive services as well as general preventive health recommendations were provided to patient.     Mickeal Needy, LPN   09/01/1094   Nurse Notes:  Normal cognitive status assessed by direct observation by this Nurse Health Advisor. No abnormalities found.

## 2023-01-15 ENCOUNTER — Other Ambulatory Visit: Payer: Self-pay | Admitting: Internal Medicine

## 2023-01-15 DIAGNOSIS — E7800 Pure hypercholesterolemia, unspecified: Secondary | ICD-10-CM

## 2023-01-22 ENCOUNTER — Other Ambulatory Visit: Payer: Self-pay | Admitting: Internal Medicine

## 2023-01-22 DIAGNOSIS — M159 Polyosteoarthritis, unspecified: Secondary | ICD-10-CM

## 2023-02-05 DIAGNOSIS — H52203 Unspecified astigmatism, bilateral: Secondary | ICD-10-CM | POA: Diagnosis not present

## 2023-02-05 DIAGNOSIS — Z961 Presence of intraocular lens: Secondary | ICD-10-CM | POA: Diagnosis not present

## 2023-02-05 DIAGNOSIS — H353121 Nonexudative age-related macular degeneration, left eye, early dry stage: Secondary | ICD-10-CM | POA: Diagnosis not present

## 2023-02-05 DIAGNOSIS — H401131 Primary open-angle glaucoma, bilateral, mild stage: Secondary | ICD-10-CM | POA: Diagnosis not present

## 2023-02-13 ENCOUNTER — Ambulatory Visit (INDEPENDENT_AMBULATORY_CARE_PROVIDER_SITE_OTHER): Payer: Medicare Other | Admitting: Internal Medicine

## 2023-02-13 ENCOUNTER — Encounter: Payer: Self-pay | Admitting: Internal Medicine

## 2023-02-13 VITALS — BP 132/72 | HR 58 | Temp 97.6°F | Ht 63.0 in | Wt 159.0 lb

## 2023-02-13 DIAGNOSIS — E7800 Pure hypercholesterolemia, unspecified: Secondary | ICD-10-CM | POA: Diagnosis not present

## 2023-02-13 DIAGNOSIS — I10 Essential (primary) hypertension: Secondary | ICD-10-CM | POA: Diagnosis not present

## 2023-02-13 DIAGNOSIS — K7581 Nonalcoholic steatohepatitis (NASH): Secondary | ICD-10-CM

## 2023-02-13 DIAGNOSIS — E538 Deficiency of other specified B group vitamins: Secondary | ICD-10-CM | POA: Diagnosis not present

## 2023-02-13 DIAGNOSIS — D508 Other iron deficiency anemias: Secondary | ICD-10-CM

## 2023-02-13 DIAGNOSIS — I480 Paroxysmal atrial fibrillation: Secondary | ICD-10-CM

## 2023-02-13 DIAGNOSIS — L280 Lichen simplex chronicus: Secondary | ICD-10-CM

## 2023-02-13 DIAGNOSIS — E559 Vitamin D deficiency, unspecified: Secondary | ICD-10-CM | POA: Diagnosis not present

## 2023-02-13 LAB — IBC + FERRITIN
Ferritin: 18.3 ng/mL (ref 10.0–291.0)
Iron: 132 ug/dL (ref 42–145)
Saturation Ratios: 37.4 % (ref 20.0–50.0)
TIBC: 352.8 ug/dL (ref 250.0–450.0)
Transferrin: 252 mg/dL (ref 212.0–360.0)

## 2023-02-13 LAB — HEPATIC FUNCTION PANEL
ALT: 16 U/L (ref 0–35)
AST: 21 U/L (ref 0–37)
Albumin: 4.3 g/dL (ref 3.5–5.2)
Alkaline Phosphatase: 96 U/L (ref 39–117)
Bilirubin, Direct: 0.1 mg/dL (ref 0.0–0.3)
Total Bilirubin: 0.7 mg/dL (ref 0.2–1.2)
Total Protein: 7.5 g/dL (ref 6.0–8.3)

## 2023-02-13 LAB — BASIC METABOLIC PANEL
BUN: 15 mg/dL (ref 6–23)
CO2: 31 mEq/L (ref 19–32)
Calcium: 9.9 mg/dL (ref 8.4–10.5)
Chloride: 102 mEq/L (ref 96–112)
Creatinine, Ser: 0.77 mg/dL (ref 0.40–1.20)
GFR: 73.94 mL/min (ref >60.00)
Glucose, Bld: 101 mg/dL — ABNORMAL HIGH (ref 70–99)
Potassium: 4.3 mEq/L (ref 3.5–5.1)
Sodium: 141 mEq/L (ref 135–145)

## 2023-02-13 LAB — VITAMIN B12: Vitamin B-12: 351 pg/mL (ref 211–911)

## 2023-02-13 LAB — TSH: TSH: 2.31 u[IU]/mL (ref 0.35–5.50)

## 2023-02-13 LAB — FOLATE: Folate: >23.8 ng/mL (ref >5.9)

## 2023-02-13 LAB — VITAMIN D 25 HYDROXY (VIT D DEFICIENCY, FRACTURES): VITD: 44.21 ng/mL (ref 30.00–100.00)

## 2023-02-13 LAB — MAGNESIUM: Magnesium: 2.1 mg/dL (ref 1.5–2.5)

## 2023-02-13 NOTE — Patient Instructions (Signed)
Atrial Fibrillation Atrial fibrillation (AFib) is a type of irregular or rapid heartbeat (arrhythmia). In AFib, the top part of the heart (atria) beats in an irregular pattern. This makes the heart unable to pump blood normally and effectively. The goal of treatment is to prevent blood clots from forming, control your heart rate, or restore your heartbeat to a normal rhythm. If this condition is not treated, it can cause serious problems, such as a weakened heart muscle (cardiomyopathy) or a stroke. What are the causes? This condition is often caused by medical conditions that damage the heart's electrical system. These include: High blood pressure (hypertension). This is the most common cause. Certain heart problems or conditions, such as heart failure, coronary artery disease, heart valve problems, or heart surgery. Diabetes. Overactive thyroid (hyperthyroidism). Chronic kidney disease. Certain lung conditions, such as emphysema, pneumonia, or COPD. Obstructive sleep apnea. In some cases, the cause of this condition is not known. What increases the risk? This condition is more likely to develop in: Older adults. Athletes who do endurance exercise. People who have a family history of AFib. Males. People who are Caucasian. People who are obese. People who smoke or misuse alcohol. What are the signs or symptoms? Symptoms of this condition include: Fast or irregular heartbeats (palpitations). Discomfort or pain in your chest. Shortness of breath. Sudden light-headedness or weakness. Tiring easily during exercise or activity. Syncope (fainting). Sweating. In some cases, there are no symptoms. How is this diagnosed? Your health care provider may detect AFib when taking your pulse. If detected, this condition may be diagnosed with: An electrocardiogram (ECG) to check electrical signals of the heart. An ambulatory cardiac monitor to record your heart's activity for a few days. A  transthoracic echocardiogram (TTE) to create pictures of your heart. A transesophageal echocardiogram (TEE) to create even clearer pictures of your heart. A stress test to check your blood supply while you exercise. Imaging tests, such as a CT scan or chest X-ray. Blood tests. How is this treated? Treatment depends on underlying conditions and how you feel when you get AFib. This condition may be treated with: Medicines to prevent blood clots or to treat heart rate or heart rhythm problems. Electrical cardioversion to reset the heart's rhythm. A pacemaker to correct abnormal heart rhythm. Ablation to remove the heart tissue that sends abnormal signals. Left atrial appendage closure to seal the area where blood clots can form. In some cases, underlying conditions will be treated. Follow these instructions at home: Medicines Take over-the counter and prescription medicines only as told by your provider. Do not take any new medicines without talking to your provider. If you are taking blood thinners: Talk with your provider before taking aspirin or NSAIDs. These medicines can raise your risk of bleeding. Take your medicines as told. Take them at the same time each day. Do not do things that could hurt or bruise you. Be careful to avoid falls. Wear an alert bracelet or carry a card that says that you take blood thinners. Lifestyle Do not use any products that contain nicotine or tobacco. These products include cigarettes, chewing tobacco, and vaping devices, such as e-cigarettes. If you need help quitting, ask your provider. Eat heart-healthy foods. Talk with a food expert (dietitian) to make an eating plan that is right for you. Exercise regularly as told by your provider. Do not drink alcohol. Lose weight if you are overweight. General instructions If you have obstructive sleep apnea, manage your condition as told by your provider.   Do not use diet pills unless your provider approves. Diet  pills can make heart problems worse. Keep all follow-up visits. Your provider will want to check your heart rate and rhythm regularly. Contact a health care provider if: You notice a change in the rate, rhythm, or strength of your heartbeat. You are taking a blood thinner and you notice more bruising. You tire more easily when you exercise or do heavy work. You have a sudden change in weight. Get help right away if:  You have chest pain. You have trouble breathing. You have side effects of blood thinners, such as blood in your vomit, poop (stool), or pee (urine), or bleeding that does not stop. You have any symptoms of a stroke. "BE FAST" is an easy way to remember the main warning signs of a stroke: B - Balance. Signs are dizziness, sudden trouble walking, or loss of balance. E - Eyes. Signs are trouble seeing or a sudden change in vision. F - Face. Signs are sudden weakness or numbness of the face, or the face or eyelid drooping on one side. A - Arms. Signs are weakness or numbness in an arm. This happens suddenly and usually on one side of the body. S - Speech.Signs are sudden trouble speaking, slurred speech, or trouble understanding what people say. T - Time. Time to call emergency services. Write down what time symptoms started. Other signs of a stroke, such as: A sudden, severe headache with no known cause. Nausea or vomiting. Seizure. These symptoms may be an emergency. Get help right away. Call 911. Do not wait to see if the symptoms will go away. Do not drive yourself to the hospital. This information is not intended to replace advice given to you by your health care provider. Make sure you discuss any questions you have with your health care provider. Document Revised: 05/02/2022 Document Reviewed: 05/02/2022 Elsevier Patient Education  2024 [employer]  

## 2023-02-13 NOTE — Progress Notes (Unsigned)
Subjective:  Patient ID: Colleen White, female    DOB: 1946/02/03  Age: 77 y.o. MRN: [id number]  CC: Hypertension and Gastroesophageal Reflux   HPI Colleen White presents for f/up ---  Discussed the use of AI scribe software for clinical note transcription with the patient, who gave verbal consent to proceed.  History of Present Illness   The patient, with a history of cardiac issues managed with Xarelto and Cardizem, reports occasional episodes of hand swelling and afternoon fatigue. The swelling, which has occurred twice recently, is transient and may be related to increased yard work. They deny any chest pain, shortness of breath, or dizziness during their active lifestyle, which includes yard work, housework, and gym visits.  The patient also reports occasional irregular heartbeats, for which they take an extra dose of Cardizem. This has occurred twice in the past year, with the most recent episode about four to five months ago. They deny any significant changes since their last EKG in December.  They have not experienced any bleeding with Xarelto, but do note occasional bruising around the ankle, which they attribute to varicose veins. They deny any muscle aches or weakness, but do report joint pain after walking outdoors.       Outpatient Medications Prior to Visit  Medication Sig Dispense Refill   Ascorbic Acid (VITAMIN C PO) Take 500 mg by mouth daily.     brimonidine-timolol (COMBIGAN) 0.2-0.5 % ophthalmic solution Place 1 drop into both eyes every 12 (twelve) hours.      calcium citrate (CALCITRATE - DOSED IN MG ELEMENTAL CALCIUM) 950 (200 Ca) MG tablet Take 200 mg of elemental calcium by mouth 2 (two) times daily.     chlorpheniramine-HYDROcodone (TUSSIONEX) 10-8 MG/5ML Take 5 mLs by mouth every 12 (twelve) hours as needed for cough. 115 mL 0   Cholecalciferol (VITAMIN D3) 1000 units CAPS Take 1,000 Units by mouth.      Coenzyme Q10 (CO Q 10) 100 MG CAPS Take 1 tablet by  mouth daily.      dicyclomine (BENTYL) 10 MG capsule Take 10 mg by mouth as needed for spasms.     diltiazem (CARDIZEM CD) 120 MG 24 hr capsule TAKE ONE CAPSULE BY MOUTH DAILY 90 capsule 3   docusate sodium (COLACE) 100 MG capsule Take 200 mg by mouth as needed.     esomeprazole (NEXIUM) 20 MG packet Take 20 mg by mouth at bedtime. Takes as needed.     fluocinonide (LIDEX) 0.05 % external solution Apply 1 Application topically as directed. Itchy Scalp     gabapentin (NEURONTIN) 100 MG capsule Take 3 capsules (300 mg total) by mouth at bedtime. 270 capsule 1   loratadine (CLARITIN) 10 MG tablet Take by mouth daily as needed.      MINOXIDIL FOR MEN EX Apply topically. Takes as needed at night.     mirtazapine (REMERON) 15 MG tablet Take 7.5 mg by mouth as needed. Takes 1/4 tablet at bedtime for itching of scalp     Multiple Vitamins-Minerals (CENTRUM SILVER PO) Take 1 tablet by mouth daily.     Multiple Vitamins-Minerals (PRESERVISION AREDS 2 PO) Take 1 tablet by mouth 2 (two) times daily.      potassium chloride (MICRO-K) 10 MEQ CR capsule Take 1 capsule (10 mEq total) by mouth 2 (two) times daily. 180 capsule 1   pravastatin (PRAVACHOL) 40 MG tablet TAKE ONE TABLET BY MOUTH AT BEDTIME 90 tablet 0   Probiotic Product (PROBIOTIC DAILY PO) Take 1  tablet by mouth daily.     tiZANidine (ZANAFLEX) 4 MG tablet Take 1 tablet (4 mg total) by mouth every 8 (eight) hours as needed for muscle spasms. 90 tablet 3   traMADol (ULTRAM) 50 MG tablet TAKE ONE TABLET BY MOUTH EVERY 6 HOURS AS NEEDED 65 tablet 3   tretinoin (RETIN-A) 0.05 % cream Once daily     telmisartan-hydrochlorothiazide (MICARDIS HCT) 80-12.5 MG tablet TAKE ONE TABLET BY MOUTH ONCE DAILY 90 tablet 1   XARELTO 20 MG TABS tablet TAKE ONE TABLET BY MOUTH WITH SUPPER 30 tablet 6   No facility-administered medications prior to visit.    ROS Review of Systems  Constitutional:  Positive for fatigue. Negative for appetite change, chills,  diaphoresis and unexpected weight change.  HENT: Negative.    Eyes: Negative.  Negative for visual disturbance.  Respiratory:  Negative for cough, chest tightness, shortness of breath and wheezing.   Cardiovascular:  Negative for chest pain, palpitations and leg swelling.  Gastrointestinal:  Negative for abdominal pain, constipation, diarrhea, nausea and vomiting.  Endocrine: Negative.   Genitourinary: Negative.  Negative for difficulty urinating.  Musculoskeletal:  Positive for arthralgias. Negative for myalgias.  Skin: Negative.   Neurological:  Negative for dizziness, weakness, light-headedness and headaches.  Hematological:  Negative for adenopathy. Does not bruise/bleed easily.  Psychiatric/Behavioral:  Positive for decreased concentration. Negative for confusion. The patient is nervous/anxious.     Objective:  BP 132/72 (BP Location: Right Arm, Patient Position: Sitting, Cuff Size: Large)   Pulse (!) 58   Temp 97.6 F (36.4 C) (Oral)   Ht 5\' 3"  (1.6 m)   Wt 159 lb (72.1 kg)   SpO2 99%   BMI 28.17 kg/m   BP Readings from Last 3 Encounters:  02/13/23 132/72  12/03/22 118/60  09/24/22 (!) 150/73    Wt Readings from Last 3 Encounters:  02/13/23 159 lb (72.1 kg)  12/03/22 160 lb 9.6 oz (72.8 kg)  09/05/22 158 lb 12.8 oz (72 kg)    Physical Exam Vitals reviewed.  HENT:     Nose: Nose normal.     Mouth/Throat:     Mouth: Mucous membranes are moist.  Eyes:     General: No scleral icterus.    Conjunctiva/sclera: Conjunctivae normal.  Cardiovascular:     Rate and Rhythm: Regular rhythm. Bradycardia present.     Heart sounds: Murmur heard.     Systolic murmur is present with a grade of 1/6.     No diastolic murmur is present.     No friction rub. No gallop.  Pulmonary:     Effort: Pulmonary effort is normal.     Breath sounds: No stridor. No wheezing, rhonchi or rales.  Abdominal:     General: Abdomen is flat.     Palpations: There is no mass.     Tenderness:  There is no abdominal tenderness. There is no guarding.     Hernia: No hernia is present.  Musculoskeletal:        General: Normal range of motion.     Cervical back: Neck supple.     Right lower leg: No edema.     Left lower leg: No edema.  Skin:    General: Skin is warm and dry.     Coloration: Skin is not pale.  Neurological:     General: No focal deficit present.     Mental Status: She is alert. Mental status is at baseline.  Psychiatric:  Mood and Affect: Mood normal.        Behavior: Behavior normal.     Lab Results  Component Value Date   WBC 6.6 09/24/2022   HGB 13.8 09/24/2022   HCT 39.8 09/24/2022   PLT 209 09/24/2022   GLUCOSE 101 (H) 02/13/2023   CHOL 143 02/13/2022   TRIG 91.0 02/13/2022   HDL 50.00 02/13/2022   LDLDIRECT 142.5 09/30/2012   LDLCALC 75 02/13/2022   ALT 16 02/13/2023   AST 21 02/13/2023   NA 141 02/13/2023   K 4.3 02/13/2023   CL 102 02/13/2023   CREATININE 0.77 02/13/2023   BUN 15 02/13/2023   CO2 31 02/13/2023   TSH 2.31 02/13/2023   INR 1.7 (H) 03/06/2021   HGBA1C 5.8 09/10/2018    CT ANGIO HEAD NECK W WO CM  Result Date: 09/24/2022 CLINICAL DATA:  Left-sided neck pain radiating to the head EXAM: CT ANGIOGRAPHY HEAD AND NECK TECHNIQUE: Multidetector CT imaging of the head and neck was performed using the standard protocol during bolus administration of intravenous contrast. Multiplanar CT image reconstructions and MIPs were obtained to evaluate the vascular anatomy. Carotid stenosis measurements (when applicable) are obtained utilizing NASCET criteria, using the distal internal carotid diameter as the denominator. RADIATION DOSE REDUCTION: This exam was performed according to the departmental dose-optimization program which includes automated exposure control, adjustment of the mA and/or kV according to patient size and/or use of iterative reconstruction technique. CONTRAST:  75mL OMNIPAQUE IOHEXOL 350 MG/ML SOLN COMPARISON:   11/11/2019 FINDINGS: CT HEAD FINDINGS Brain: There is no mass, hemorrhage or extra-axial collection. The size and configuration of the ventricles and extra-axial CSF spaces are normal. There is no acute or chronic infarction. The brain parenchyma is normal. Skull: The visualized skull base, calvarium and extracranial soft tissues are normal. Sinuses/Orbits: No fluid levels or advanced mucosal thickening of the visualized paranasal sinuses. No mastoid or middle ear effusion. The orbits are normal. CTA NECK FINDINGS SKELETON: There is no bony spinal canal stenosis. No lytic or blastic lesion. OTHER NECK: Normal pharynx, larynx and major salivary glands. No cervical lymphadenopathy. Unremarkable thyroid gland. UPPER CHEST: No pneumothorax or pleural effusion. No nodules or masses. AORTIC ARCH: There is no calcific atherosclerosis of the aortic arch. There is no aneurysm, dissection or hemodynamically significant stenosis of the visualized portion of the aorta. Normal variant aortic arch branching pattern with the left vertebral artery arising independently from the aortic arch. The visualized proximal subclavian arteries are widely patent. RIGHT CAROTID SYSTEM: Normal without aneurysm, dissection or stenosis. LEFT CAROTID SYSTEM: Normal without aneurysm, dissection or stenosis. VERTEBRAL ARTERIES: Right dominant configuration. Both origins are clearly patent. There is no dissection, occlusion or flow-limiting stenosis to the skull base (V1-V3 segments). CTA HEAD FINDINGS POSTERIOR CIRCULATION: --Vertebral arteries: Normal V4 segments. --Inferior cerebellar arteries: Normal. --Basilar artery: Normal. --Superior cerebellar arteries: Normal. --Posterior cerebral arteries (PCA): Normal. ANTERIOR CIRCULATION: --Intracranial internal carotid arteries: Normal. --Anterior cerebral arteries (ACA): Normal. Both A1 segments are present. Patent anterior communicating artery (a-comm). --Middle cerebral arteries (MCA): Normal.  VENOUS SINUSES: As permitted by contrast timing, patent. ANATOMIC VARIANTS: None Review of the MIP images confirms the above findings. IMPRESSION: Normal CTA of the head and neck. Electronically Signed   By: Deatra Robinson M.D.   On: 09/24/2022 20:46    Assessment & Plan:   Essential hypertension- Her BP is well controlled. -     Basic metabolic panel; Future -     Magnesium; Future -  TSH; Future -     Telmisartan-HCTZ; Take 1 tablet by mouth daily.  Dispense: 90 tablet; Refill: 1  HYPERCHOLESTEROLEMIA- LDL goal achieved. Doing well on the statin  -     TSH; Future -     Hepatic function panel; Future  Paroxysmal atrial fibrillation (HCC)- She has good R/R control. -     TSH; Future  Neurodermatitis -     Vitamin B1; Future -     Vitamin B6; Future -     Zinc; Future  NASH (nonalcoholic steatohepatitis) -     Hepatic function panel; Future  Vitamin D deficiency disease -     VITAMIN D 25 Hydroxy (Vit-D Deficiency, Fractures); Future  B12 deficiency due to diet -     Folate; Future -     Vitamin B12; Future  Iron deficiency anemia secondary to inadequate dietary iron intake -     IBC + Ferritin; Future  Essential (primary) hypertension -     Telmisartan-HCTZ; Take 1 tablet by mouth daily.  Dispense: 90 tablet; Refill: 1     Follow-up: Return in about 6 months (around 08/15/2023).  Sanda Linger, MD

## 2023-02-15 ENCOUNTER — Encounter: Payer: Self-pay | Admitting: Internal Medicine

## 2023-02-15 ENCOUNTER — Other Ambulatory Visit (HOSPITAL_COMMUNITY): Payer: Self-pay | Admitting: Cardiology

## 2023-02-15 ENCOUNTER — Other Ambulatory Visit: Payer: Self-pay | Admitting: Internal Medicine

## 2023-02-15 DIAGNOSIS — I10 Essential (primary) hypertension: Secondary | ICD-10-CM

## 2023-02-15 MED ORDER — TELMISARTAN-HCTZ 80-12.5 MG PO TABS: 1.0000 | ORAL_TABLET | Freq: Every day | ORAL | 1 refills | Status: DC

## 2023-02-17 LAB — ZINC: Zinc: 220 ug/dL — ABNORMAL HIGH (ref 60–130)

## 2023-02-17 LAB — VITAMIN B1: Vitamin B1 (Thiamine): 43 nmol/L — ABNORMAL HIGH (ref 8–30)

## 2023-02-17 LAB — VITAMIN B6: Vitamin B6: 20.8 ng/mL (ref 2.1–21.7)

## 2023-02-19 ENCOUNTER — Other Ambulatory Visit: Payer: Self-pay | Admitting: Internal Medicine

## 2023-02-19 ENCOUNTER — Encounter: Payer: Self-pay | Admitting: Internal Medicine

## 2023-02-21 DIAGNOSIS — Z6828 Body mass index (BMI) 28.0-28.9, adult: Secondary | ICD-10-CM | POA: Diagnosis not present

## 2023-02-21 DIAGNOSIS — R3989 Other symptoms and signs involving the genitourinary system: Secondary | ICD-10-CM | POA: Diagnosis not present

## 2023-02-21 DIAGNOSIS — Z01411 Encounter for gynecological examination (general) (routine) with abnormal findings: Secondary | ICD-10-CM | POA: Diagnosis not present

## 2023-02-21 DIAGNOSIS — R82998 Other abnormal findings in urine: Secondary | ICD-10-CM | POA: Diagnosis not present

## 2023-02-21 DIAGNOSIS — Z124 Encounter for screening for malignant neoplasm of cervix: Secondary | ICD-10-CM | POA: Diagnosis not present

## 2023-02-21 DIAGNOSIS — Z01419 Encounter for gynecological examination (general) (routine) without abnormal findings: Secondary | ICD-10-CM | POA: Diagnosis not present

## 2023-02-21 DIAGNOSIS — M8580 Other specified disorders of bone density and structure, unspecified site: Secondary | ICD-10-CM | POA: Diagnosis not present

## 2023-02-21 DIAGNOSIS — Z90710 Acquired absence of both cervix and uterus: Secondary | ICD-10-CM | POA: Diagnosis not present

## 2023-02-21 DIAGNOSIS — R9230 Dense breasts, unspecified: Secondary | ICD-10-CM | POA: Diagnosis not present

## 2023-02-26 ENCOUNTER — Emergency Department (HOSPITAL_BASED_OUTPATIENT_CLINIC_OR_DEPARTMENT_OTHER): Payer: Medicare Other

## 2023-02-26 ENCOUNTER — Encounter (HOSPITAL_BASED_OUTPATIENT_CLINIC_OR_DEPARTMENT_OTHER): Payer: Self-pay | Admitting: Urology

## 2023-02-26 ENCOUNTER — Emergency Department (HOSPITAL_BASED_OUTPATIENT_CLINIC_OR_DEPARTMENT_OTHER)
Admission: EM | Admit: 2023-02-26 | Discharge: 2023-02-26 | Disposition: A | Discharge status: Home / Self Care | Payer: Medicare Other | Attending: Emergency Medicine | Admitting: Emergency Medicine

## 2023-02-26 DIAGNOSIS — Z79899 Other long term (current) drug therapy: Secondary | ICD-10-CM | POA: Insufficient documentation

## 2023-02-26 DIAGNOSIS — Z8572 Personal history of non-Hodgkin lymphomas: Secondary | ICD-10-CM | POA: Diagnosis not present

## 2023-02-26 DIAGNOSIS — S0990XA Unspecified injury of head, initial encounter: Secondary | ICD-10-CM | POA: Diagnosis not present

## 2023-02-26 DIAGNOSIS — I10 Essential (primary) hypertension: Secondary | ICD-10-CM | POA: Insufficient documentation

## 2023-02-26 DIAGNOSIS — Z7901 Long term (current) use of anticoagulants: Secondary | ICD-10-CM | POA: Insufficient documentation

## 2023-02-26 DIAGNOSIS — M542 Cervicalgia: Secondary | ICD-10-CM | POA: Diagnosis not present

## 2023-02-26 DIAGNOSIS — M5020 Other cervical disc displacement, unspecified cervical region: Secondary | ICD-10-CM | POA: Diagnosis not present

## 2023-02-26 DIAGNOSIS — W06XXXA Fall from bed, initial encounter: Secondary | ICD-10-CM | POA: Insufficient documentation

## 2023-02-26 DIAGNOSIS — S199XXA Unspecified injury of neck, initial encounter: Secondary | ICD-10-CM | POA: Diagnosis not present

## 2023-02-26 DIAGNOSIS — N6459 Other signs and symptoms in breast: Secondary | ICD-10-CM | POA: Diagnosis not present

## 2023-02-26 DIAGNOSIS — Z9189 Other specified personal risk factors, not elsewhere classified: Secondary | ICD-10-CM | POA: Diagnosis not present

## 2023-02-26 DIAGNOSIS — M47812 Spondylosis without myelopathy or radiculopathy, cervical region: Secondary | ICD-10-CM | POA: Diagnosis not present

## 2023-02-26 DIAGNOSIS — Z803 Family history of malignant neoplasm of breast: Secondary | ICD-10-CM | POA: Diagnosis not present

## 2023-02-26 NOTE — Discharge Instructions (Addendum)
You have been evaluated for your fall.  Fortunately CT scan of the head and neck did not show any concerning finding.  You may take Tylenol as needed for aches and pain and follow-up with your doctor for further care.

## 2023-02-26 NOTE — ED Provider Notes (Signed)
[place] [facility] Provider Note   CSN: [id number] Arrival date & time: 02/26/23  1622     History  Chief Complaint  Patient presents with   Head Injury    Kristyna Abdella is a 77 y.o. female.  The history is provided by the patient and medical records. No language interpreter was used.  Head Injury     77 year old female significant history of atrial fibrillation currently on Xarelto, hypertension, recurrent headache, non-Hodgkin lymphoma, osteopenia, obstructive sleep apnea presents ED for evaluation of head injury.  Patient report this morning she was in the process to turn off an alarm clock next to her nightstand when she lost balance, fell off the bed and striking the left side of her head against the nightstand.  She also struck her left shoulder but denies any loss of consciousness.  She has endorsed throbbing headache and pain to the left side of her neck since the injury.  Pain is moderate in severity and persistent and not improved with taking Tylenol.  She does not normally have an alarm clock but had to set it for her husband.  Due to being on blood thinner medication patient decided to come here to be evaluated.  She does not endorse any vision changes no nausea vomiting no focal numbness or focal weakness no confusion.  She denies any precipitating symptoms prior to her fall.  She was able to get up on her own.  Home Medications Prior to Admission medications   Medication Sig Start Date End Date Taking? Authorizing Provider  Ascorbic Acid (VITAMIN C PO) Take 500 mg by mouth daily.    [provider]  brimonidine-timolol (COMBIGAN) 0.2-0.5 % ophthalmic solution Place 1 drop into both eyes every 12 (twelve) hours.     [provider]  calcium citrate (CALCITRATE - DOSED IN MG ELEMENTAL CALCIUM) 950 (200 Ca) MG tablet Take 200 mg of elemental calcium by mouth 2 (two) times daily.    [provider]   chlorpheniramine-HYDROcodone (TUSSIONEX) 10-8 MG/5ML Take 5 mLs by mouth every 12 (twelve) hours as needed for cough. 08/16/22   Etta Grandchild, MD  Cholecalciferol (VITAMIN D3) 1000 units CAPS Take 1,000 Units by mouth.     [provider]  Coenzyme Q10 (CO Q 10) 100 MG CAPS Take 1 tablet by mouth daily.     [provider]  dicyclomine (BENTYL) 10 MG capsule Take 10 mg by mouth as needed for spasms.    [provider]  diltiazem (CARDIZEM CD) 120 MG 24 hr capsule TAKE ONE CAPSULE BY MOUTH DAILY 03/12/22   Laurey Morale, MD  docusate sodium (COLACE) 100 MG capsule Take 200 mg by mouth as needed.    [provider]  esomeprazole (NEXIUM) 20 MG packet Take 20 mg by mouth at bedtime. Takes as needed.    [provider]  fluocinonide (LIDEX) 0.05 % external solution Apply 1 Application topically as directed. Itchy Scalp 08/29/22   [provider]  gabapentin (NEURONTIN) 100 MG capsule Take 3 capsules (300 mg total) by mouth at bedtime. 09/05/20   Etta Grandchild, MD  loratadine (CLARITIN) 10 MG tablet Take by mouth daily as needed.     [provider]  MINOXIDIL FOR MEN EX Apply topically. Takes as needed at night.    [provider]  mirtazapine (REMERON) 15 MG tablet Take 7.5 mg by mouth as needed. Takes 1/4 tablet at bedtime for itching of scalp  [provider]  Multiple Vitamins-Minerals (CENTRUM SILVER PO) Take 1 tablet by mouth daily.    [provider]  Multiple Vitamins-Minerals (PRESERVISION AREDS 2 PO) Take 1 tablet by mouth 2 (two) times daily.     [provider]  potassium chloride (MICRO-K) 10 MEQ CR capsule Take 1 capsule (10 mEq total) by mouth 2 (two) times daily. 08/16/22   Etta Grandchild, MD  pravastatin (PRAVACHOL) 40 MG tablet TAKE ONE TABLET BY MOUTH AT BEDTIME 01/15/23   Etta Grandchild, MD  Probiotic Product (PROBIOTIC DAILY PO) Take 1 tablet by mouth daily.    [provider]  rivaroxaban (XARELTO) 20 MG TABS tablet TAKE ONE TABLET BY MOUTH with SUPPER 02/15/23   Laurey Morale, MD  telmisartan-hydrochlorothiazide (MICARDIS HCT) 80-12.5 MG tablet Take 1 tablet by mouth daily. 02/15/23   Etta Grandchild, MD  tiZANidine (ZANAFLEX) 4 MG tablet Take 1 tablet (4 mg total) by mouth every 8 (eight) hours as needed for muscle spasms. 08/22/21   Etta Grandchild, MD  traMADol (ULTRAM) 50 MG tablet TAKE ONE TABLET BY MOUTH EVERY 6 HOURS AS NEEDED 01/22/23   Etta Grandchild, MD  tretinoin (RETIN-A) 0.05 % cream Once daily    [provider]      Allergies    Ibuprofen    Review of Systems   Review of Systems  All other systems reviewed and are negative.   Physical Exam Updated Vital Signs BP (!) 162/82 (BP Location: Right Arm)   Pulse 73   Temp 97.7 F (36.5 C)   Resp 18   Ht 5\' 3"  (1.6 m)   Wt 72.1 kg   SpO2 99%   BMI 28.17 kg/m  Physical Exam Vitals and nursing note reviewed.  Constitutional:      General: She is not in acute distress.    Appearance: She is well-developed.     Comments: Patient is well-groomed, resting comfortably in bed appears to be in no acute discomfort.  HENT:     Head: Atraumatic.     Comments: Mild tenderness noted to left parietal region without any bruising laceration or swelling noted.  No raccoon's eyes, no Battle sign. Eyes:     Conjunctiva/sclera: Conjunctivae normal.  Pulmonary:     Effort: Pulmonary effort is normal.  Musculoskeletal:        General: Tenderness (Mild tenderness to left posterior shoulder with full range of motion about the shoulder.  No bruising noted.) present.     Cervical back: Normal range of motion and neck supple. Tenderness (Mild tenderness to left cervical paraspinal muscle with full range of motion of the neck.) present.  Skin:    Findings: No rash.  Neurological:     Mental Status: She is alert and oriented to person, place, and time.     GCS: GCS eye subscore is 4. GCS  verbal subscore is 5. GCS motor subscore is 6.     Cranial Nerves: Cranial nerves 2-12 are intact.     Sensory: Sensation is intact.     Motor: Motor function is intact.     Coordination: Coordination is intact.  Psychiatric:        Mood and Affect: Mood normal.     ED Results / Procedures / Treatments   Labs (all labs ordered are listed, but only abnormal results are displayed) Labs Reviewed - No data to display  EKG None  Radiology CT Head Wo Contrast  Result Date: 02/26/2023 CLINICAL  DATA:  Provided history: Head trauma, GCS = 15. Head injury. No focal neuro findings. Neck trauma. EXAM: CT HEAD WITHOUT CONTRAST CT CERVICAL SPINE WITHOUT CONTRAST TECHNIQUE: Multidetector CT imaging of the head and cervical spine was performed following the standard protocol without intravenous contrast. Multiplanar CT image reconstructions of the cervical spine were also generated. RADIATION DOSE REDUCTION: This exam was performed according to the departmental dose-optimization program which includes automated exposure control, adjustment of the mA and/or kV according to patient size and/or use of iterative reconstruction technique. COMPARISON:  CTA head/neck 09/24/2022. Head CT 05/22/2021. Report from cervical spine CT 04/02/2016 (images unavailable). FINDINGS: CT HEAD FINDINGS Brain: No age advanced or lobar predominant parenchymal atrophy. There is no acute intracranial hemorrhage. No demarcated cortical infarct. No extra-axial fluid collection. No evidence of an intracranial mass. No midline shift. Vascular: No hyperdense vessel.  Atherosclerotic calcifications. Skull: No calvarial fracture or aggressive osseous lesion. Sinuses/Orbits: No mass or acute finding within the imaged orbits. Minimal mucosal thickening within the bilateral ethmoid sinuses. Trace fluid level within the right sphenoid sinus. Minimal mucosal thickening or small mucous retention cyst partially imaged within the left maxillary sinus.  CT CERVICAL SPINE FINDINGS Alignment: Nonspecific straightening of the expected cervical lordosis. No significant spondylolisthesis. Skull base and vertebrae: The basion-dental and atlanto-dental intervals are maintained.No evidence of acute fracture to the cervical spine. Soft tissues and spinal canal: No prevertebral fluid or swelling. No visible canal hematoma. Disc levels: The disc spaces are largely preserved within the cervical spine. Multilevel disc bulges/central disc protrusions and uncovertebral hypertrophy. No appreciable high-grade spinal canal stenosis. No high-grade bony neural foraminal narrowing. Upper chest: No consolidation within the imaged lung apices. No visible pneumothorax IMPRESSION: CT head: 1.  No evidence of an acute intracranial abnormality. 2. Mild paranasal sinus disease at the imaged levels, as described. CT cervical spine: 1. No evidence of an acute cervical spine fracture. 2. Nonspecific straightening of the expected cervical lordosis. 3. Cervical spondylosis as described. Electronically Signed   By: Jackey Loge D.O.   On: 02/26/2023 18:00   CT Cervical Spine Wo Contrast  Result Date: 02/26/2023 CLINICAL DATA:  Provided history: Head trauma, GCS = 15. Head injury. No focal neuro findings. Neck trauma. EXAM: CT HEAD WITHOUT CONTRAST CT CERVICAL SPINE WITHOUT CONTRAST TECHNIQUE: Multidetector CT imaging of the head and cervical spine was performed following the standard protocol without intravenous contrast. Multiplanar CT image reconstructions of the cervical spine were also generated. RADIATION DOSE REDUCTION: This exam was performed according to the departmental dose-optimization program which includes automated exposure control, adjustment of the mA and/or kV according to patient size and/or use of iterative reconstruction technique. COMPARISON:  CTA head/neck 09/24/2022. Head CT 05/22/2021. Report from cervical spine CT 04/02/2016 (images unavailable). FINDINGS: CT HEAD  FINDINGS Brain: No age advanced or lobar predominant parenchymal atrophy. There is no acute intracranial hemorrhage. No demarcated cortical infarct. No extra-axial fluid collection. No evidence of an intracranial mass. No midline shift. Vascular: No hyperdense vessel.  Atherosclerotic calcifications. Skull: No calvarial fracture or aggressive osseous lesion. Sinuses/Orbits: No mass or acute finding within the imaged orbits. Minimal mucosal thickening within the bilateral ethmoid sinuses. Trace fluid level within the right sphenoid sinus. Minimal mucosal thickening or small mucous retention cyst partially imaged within the left maxillary sinus. CT CERVICAL SPINE FINDINGS Alignment: Nonspecific straightening of the expected cervical lordosis. No significant spondylolisthesis. Skull base and vertebrae: The basion-dental and atlanto-dental intervals are maintained.No evidence of acute fracture to the cervical spine.  Soft tissues and spinal canal: No prevertebral fluid or swelling. No visible canal hematoma. Disc levels: The disc spaces are largely preserved within the cervical spine. Multilevel disc bulges/central disc protrusions and uncovertebral hypertrophy. No appreciable high-grade spinal canal stenosis. No high-grade bony neural foraminal narrowing. Upper chest: No consolidation within the imaged lung apices. No visible pneumothorax IMPRESSION: CT head: 1.  No evidence of an acute intracranial abnormality. 2. Mild paranasal sinus disease at the imaged levels, as described. CT cervical spine: 1. No evidence of an acute cervical spine fracture. 2. Nonspecific straightening of the expected cervical lordosis. 3. Cervical spondylosis as described. Electronically Signed   By: Jackey Loge D.O.   On: 02/26/2023 18:00    Procedures Procedures    Medications Ordered in ED Medications - No data to display  ED Course/ Medical Decision Making/ A&P                             Medical Decision Making Amount and/or  Complexity of Data Reviewed Radiology: ordered.   BP (!) 162/82 (BP Location: Right Arm)   Pulse 73   Temp 97.7 F (36.5 C)   Resp 18   Ht 5\' 3"  (1.6 m)   Wt 72.1 kg   SpO2 99%   BMI 28.17 kg/m   39:76 PM 77 year old female significant history of atrial fibrillation currently on Xarelto, hypertension, recurrent headache, non-Hodgkin lymphoma, osteopenia, obstructive sleep apnea presents ED for evaluation of head injury.  Patient report this morning she was in the process to turn off an alarm clock next to her nightstand when she lost balance, fell off the bed and striking the left side of her head against the nightstand.  She also struck her left shoulder but denies any loss of consciousness.  She has endorsed throbbing headache and pain to the left side of her neck since the injury.  Pain is moderate in severity and persistent and not improved with taking Tylenol.  She does not normally have an alarm clock but had to set it for her husband.  Due to being on blood thinner medication patient decided to come here to be evaluated.  She does not endorse any vision changes no nausea vomiting no focal numbness or focal weakness no confusion.  She denies any precipitating symptoms prior to her fall.  She was able to get up on her own.  On exam this is a well-appearing elderly female resting comfortably in bed appears to be in no acute discomfort.  She has a mild tenderness noted to her left parietal scalp without any bruising or crepitus.  No tenderness to the left side of her neck and shoulder without significant signs of trauma.  She is alert and oriented x 4, mentating appropriately, able to perform 3 word recall after 4 minutes and does not exhibit any symptoms concerning for concussion.  Vitals are notable for elevated blood pressure of 160/82.  CT scan of the head and cervical spine was obtained intimately viewed and interpreted by me and overall without any concerning finding.  I agree with  radiologist interpretation.  I discussed this finding with the patient and she felt reassured.  At this time patient is stable to be discharged home.  RICE therapy discussed.  Care discussed with attending Dr. Denton Lank.  Recommend Tylenol as needed for pain.  Avoid NSAIDs.        Final Clinical Impression(s) / ED Diagnoses Final diagnoses:  Minor head  injury, initial encounter    Rx / DC Orders ED Discharge Orders     None         Fayrene Helper, PA-C 02/26/23 1816    Cathren Laine, MD 02/27/23 [phone]

## 2023-02-26 NOTE — ED Notes (Signed)
Discharge instructions reviewed with patient. Patient verbalizes understanding, no further questions at this time. Medications and follow up information provided. No acute distress noted at time of departure.  

## 2023-02-26 NOTE — ED Triage Notes (Signed)
Pt states this am rolled over out of the bed and hit her head on the night stand and then landed onteh floor  States headache and pain to left parietal region where it hit   Pt takes Xarelto

## 2023-03-18 ENCOUNTER — Other Ambulatory Visit (HOSPITAL_COMMUNITY): Payer: Self-pay | Admitting: Cardiology

## 2023-03-26 IMAGING — MR MR LUMBAR SPINE W/O CM
4 of 5 series · 25 of 48 positions shown · non-contrast
Comparison: 07/22/2013

CLINICAL DATA: Low back pain radiating down to the right leg with
numbness. No known injury.

EXAM:
MRI LUMBAR SPINE WITHOUT CONTRAST
TECHNIQUE: Multiplanar, multisequence MR imaging of the lumbar spine was
performed. No intravenous contrast was administered.

[Series 3: T2 · sagittal · 4.0mm · 0.53mm/px · 6 of 18 slices shown (1 of 2)]
[im 1/18]
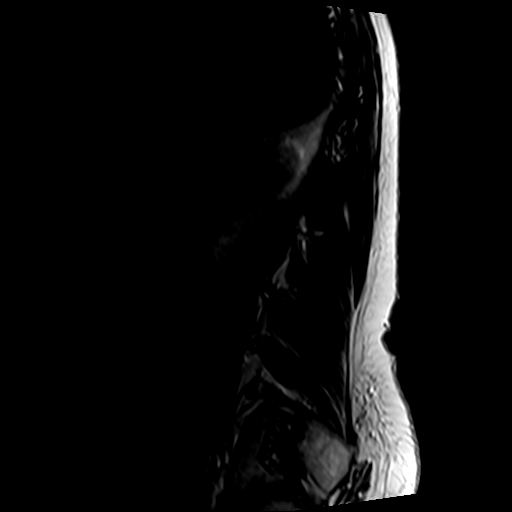
[im 4/18]
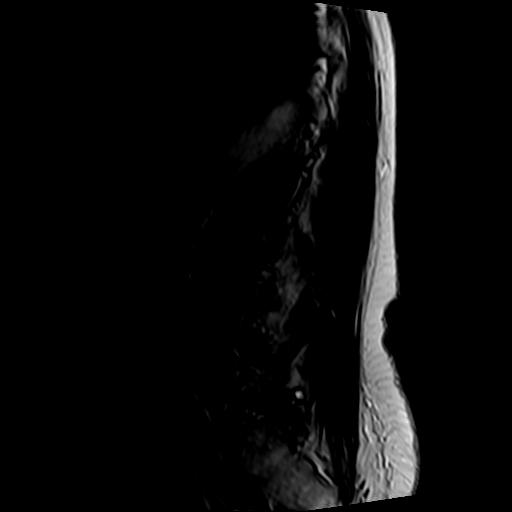
[im 7/18]
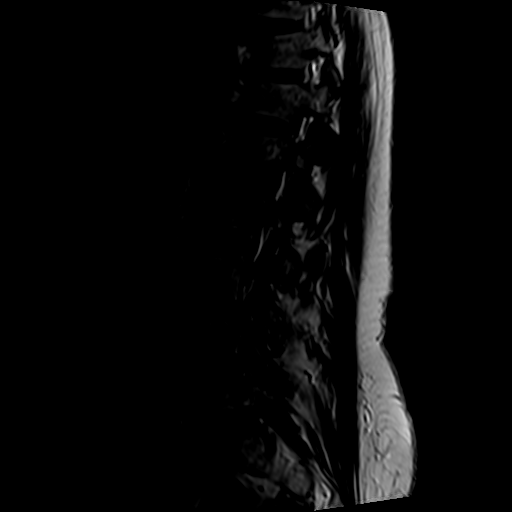
[im 11/18]
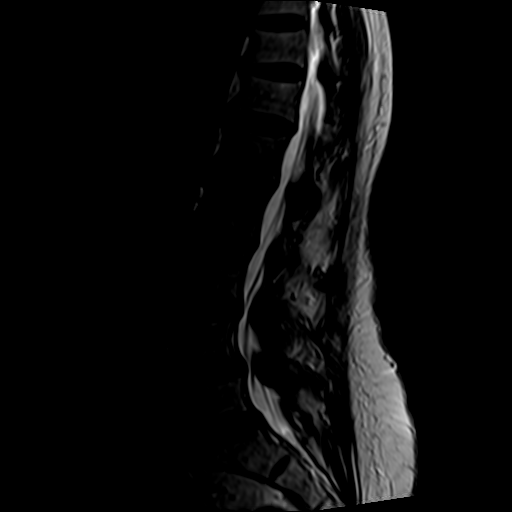
[im 14/18]
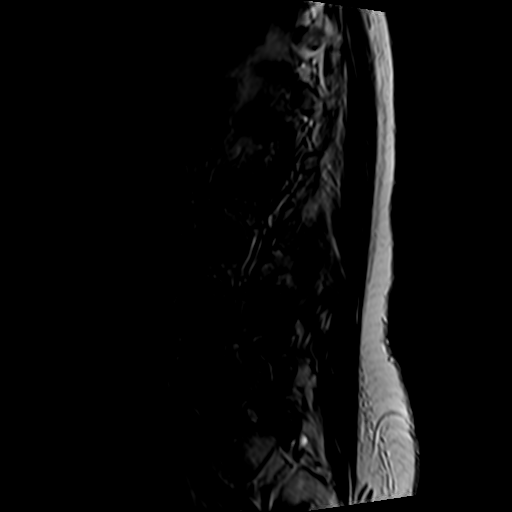
[im 18/18]
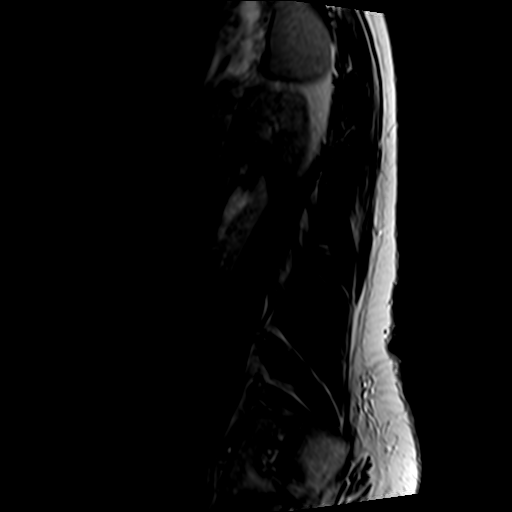

[Series 5: T1 · sagittal · 4.0mm · 0.53mm/px · 6 of 18 slices shown (1 of 2)]
[im 1/18]
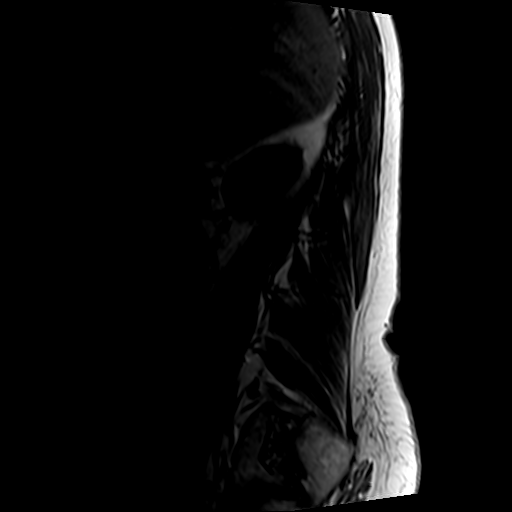
[im 4/18]
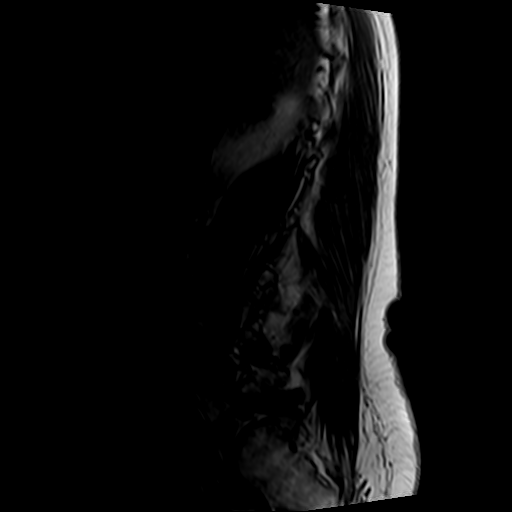
[im 7/18]
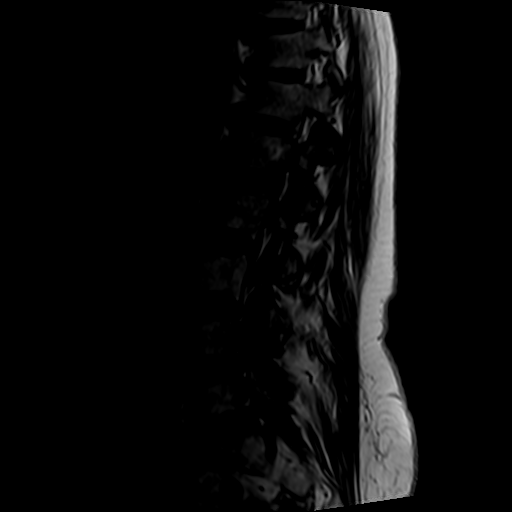
[im 11/18]
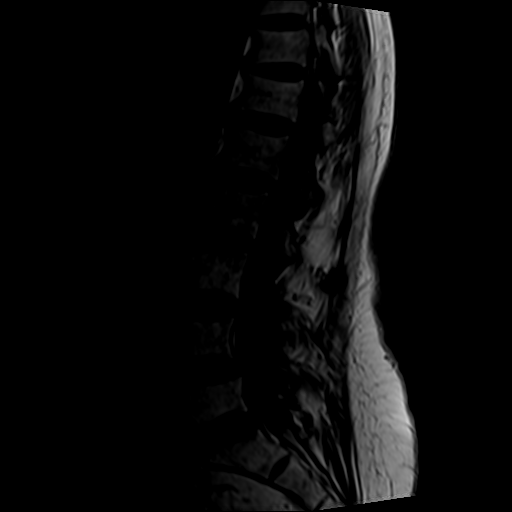
[im 14/18]
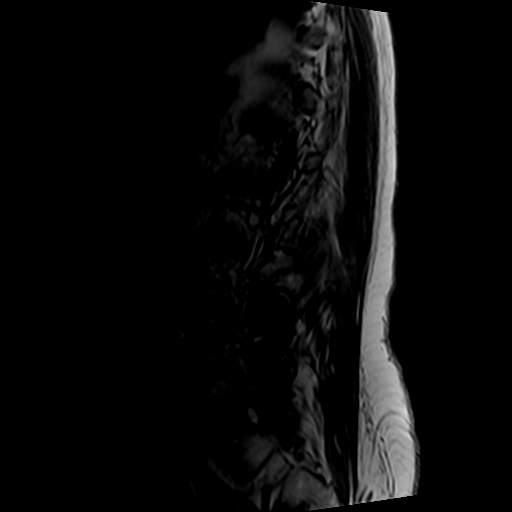
[im 18/18]
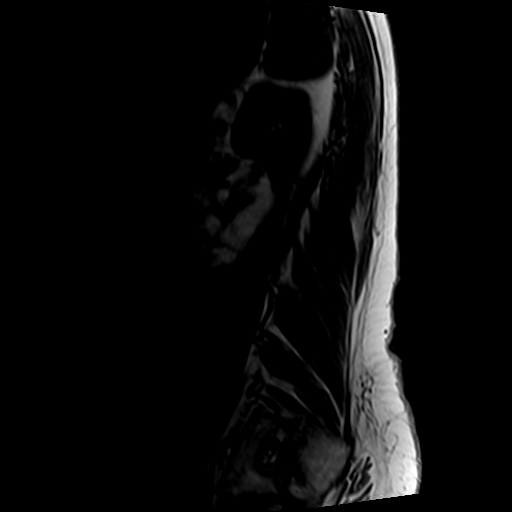

[Series 6: T2 · axial · 4.0mm · 0.78mm/px · z∈[-35,+195]mm · 9 of 45 slices shown (2 of 2)]
[im 1/45]
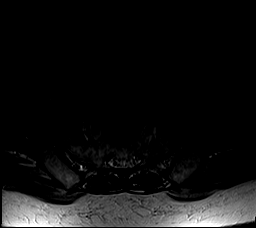
[im 7/45]
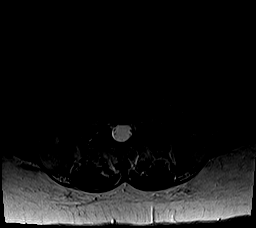
[im 13/45]
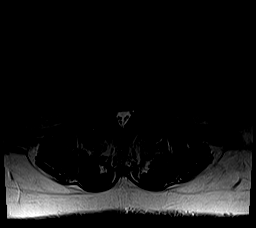
[im 19/45]
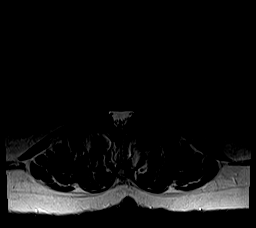
[im 23/45]
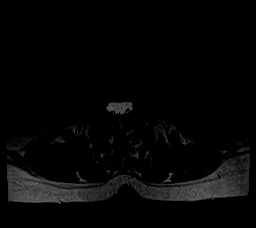
[im 26/45]
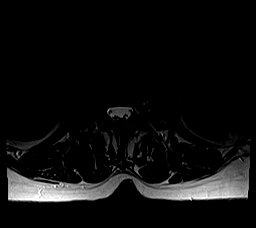
[im 32/45]
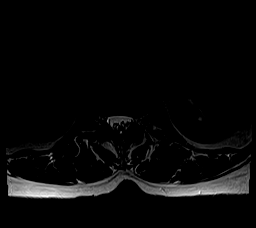
[im 38/45]
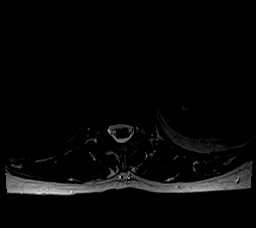
[im 45/45]
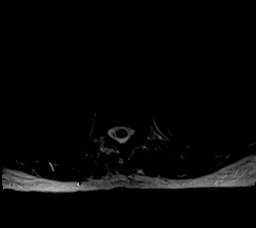

[Series 7: T1 · axial · 4.0mm · 0.39mm/px · z∈[-35,+159]mm · 4 of 45 slices shown (2 of 2)]
[im 1/45]
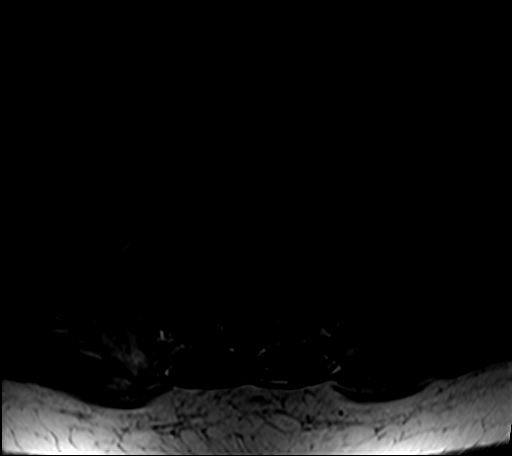
[im 7/45]
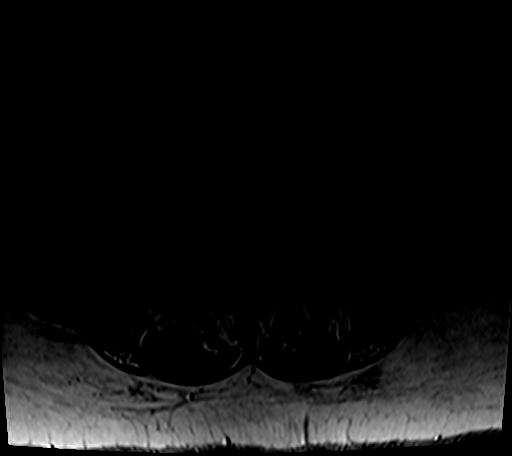
[im 23/45]
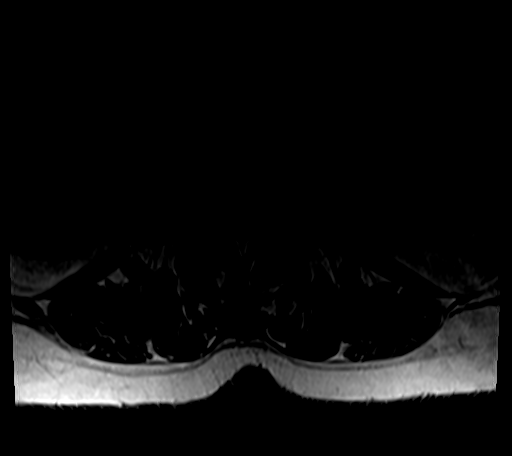
[im 38/45]
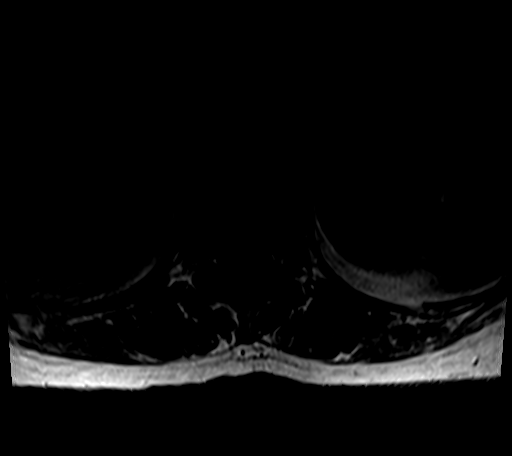

[25 of 48 positions shown; findings below may reference images not displayed]

FINDINGS: Segmentation:  Standard.

Alignment: Grade 1 anterolisthesis of L4 on L5 secondary to facet
disease.

Vertebrae: No acute fracture, evidence of discitis, or aggressive
bone lesion.

Conus medullaris and cauda equina: Conus extends to the L1 level.
Conus and cauda equina appear normal.

Paraspinal and other soft tissues: No acute paraspinal abnormality.

Disc levels:

Disc spaces: Disc desiccation throughout the lumbar spine.

T11-12: Moderate left facet arthropathy and mild right facet
arthropathy. No disc protrusion. No foraminal or central canal
stenosis.

T12-L1: Mild broad-based disc bulge. No foraminal or central canal
stenosis.

L1-L2: Minimal broad-based disc bulge. Moderate left and mild right
facet arthropathy. No foraminal or central canal stenosis.

L2-L3: Minimal broad-based disc bulge. Mild bilateral facet
arthropathy. No foraminal or central canal stenosis.

L3-L4: Mild broad-based disc bulge. Severe left and moderate right
facet arthropathy with ligamentum flavum infolding. Bilateral
lateral recess stenosis. Bilateral mild foraminal stenosis. Mild
spinal stenosis.

L4-L5: Broad-based disc bulge. Severe bilateral facet arthropathy
with bilateral facet effusions and ligamentum flavum in folding.
Moderate-severe spinal stenosis. Bilateral subarticular recess
stenosis. Moderate bilateral foraminal stenosis.

L5-S1: Mild broad-based disc bulge. Severe right and moderate left
facet arthropathy. 9 mm right facet extra-spinal synovial cyst in
close proximity to the exiting L5 nerve root. 6 mm left facet
extra-spinal synovial cyst adjacent to the exiting left L5 nerve
root.
IMPRESSION: 1. Diffuse lumbar spine spondylosis as described above which has
progressed compared with 07/22/2013 particularly at L4-5.
2.  No acute osseous injury of the lumbar spine.

## 2023-03-29 DIAGNOSIS — I83813 Varicose veins of bilateral lower extremities with pain: Secondary | ICD-10-CM | POA: Diagnosis not present

## 2023-03-29 DIAGNOSIS — R208 Other disturbances of skin sensation: Secondary | ICD-10-CM | POA: Diagnosis not present

## 2023-03-29 DIAGNOSIS — D692 Other nonthrombocytopenic purpura: Secondary | ICD-10-CM | POA: Diagnosis not present

## 2023-03-29 DIAGNOSIS — D229 Melanocytic nevi, unspecified: Secondary | ICD-10-CM | POA: Diagnosis not present

## 2023-03-29 DIAGNOSIS — D1801 Hemangioma of skin and subcutaneous tissue: Secondary | ICD-10-CM | POA: Diagnosis not present

## 2023-03-29 DIAGNOSIS — L814 Other melanin hyperpigmentation: Secondary | ICD-10-CM | POA: Diagnosis not present

## 2023-03-29 DIAGNOSIS — L821 Other seborrheic keratosis: Secondary | ICD-10-CM | POA: Diagnosis not present

## 2023-04-16 DIAGNOSIS — C8230 Follicular lymphoma grade IIIa, unspecified site: Secondary | ICD-10-CM | POA: Diagnosis not present

## 2023-05-06 ENCOUNTER — Other Ambulatory Visit: Payer: Self-pay | Admitting: Internal Medicine

## 2023-05-06 DIAGNOSIS — E7800 Pure hypercholesterolemia, unspecified: Secondary | ICD-10-CM

## 2023-05-15 ENCOUNTER — Other Ambulatory Visit: Payer: Self-pay | Admitting: Internal Medicine

## 2023-05-15 DIAGNOSIS — M159 Polyosteoarthritis, unspecified: Secondary | ICD-10-CM

## 2023-08-07 ENCOUNTER — Ambulatory Visit (HOSPITAL_COMMUNITY)
Admission: RE | Admit: 2023-08-07 | Discharge: 2023-08-07 | Disposition: A | Discharge status: Home / Self Care | Payer: Medicare Other | Source: Ambulatory Visit | Attending: Cardiology | Admitting: Cardiology

## 2023-08-07 ENCOUNTER — Encounter (HOSPITAL_COMMUNITY): Payer: Self-pay | Admitting: Cardiology

## 2023-08-07 VITALS — BP 128/78 | HR 57 | Wt 162.1 lb

## 2023-08-07 DIAGNOSIS — E7800 Pure hypercholesterolemia, unspecified: Secondary | ICD-10-CM | POA: Insufficient documentation

## 2023-08-07 DIAGNOSIS — G4733 Obstructive sleep apnea (adult) (pediatric): Secondary | ICD-10-CM | POA: Insufficient documentation

## 2023-08-07 DIAGNOSIS — Z8572 Personal history of non-Hodgkin lymphomas: Secondary | ICD-10-CM | POA: Insufficient documentation

## 2023-08-07 DIAGNOSIS — I10 Essential (primary) hypertension: Secondary | ICD-10-CM | POA: Diagnosis present

## 2023-08-07 DIAGNOSIS — E785 Hyperlipidemia, unspecified: Secondary | ICD-10-CM | POA: Diagnosis present

## 2023-08-07 DIAGNOSIS — R0789 Other chest pain: Secondary | ICD-10-CM | POA: Insufficient documentation

## 2023-08-07 DIAGNOSIS — Z7901 Long term (current) use of anticoagulants: Secondary | ICD-10-CM | POA: Insufficient documentation

## 2023-08-07 DIAGNOSIS — I110 Hypertensive heart disease with heart failure: Secondary | ICD-10-CM | POA: Insufficient documentation

## 2023-08-07 DIAGNOSIS — R9431 Abnormal electrocardiogram [ECG] [EKG]: Secondary | ICD-10-CM | POA: Diagnosis not present

## 2023-08-07 DIAGNOSIS — I480 Paroxysmal atrial fibrillation: Secondary | ICD-10-CM | POA: Insufficient documentation

## 2023-08-07 DIAGNOSIS — I5022 Chronic systolic (congestive) heart failure: Secondary | ICD-10-CM | POA: Diagnosis not present

## 2023-08-07 DIAGNOSIS — I4891 Unspecified atrial fibrillation: Secondary | ICD-10-CM | POA: Diagnosis present

## 2023-08-07 LAB — LIPID PANEL
Cholesterol: 128 mg/dL (ref 0–200)
HDL: 39 mg/dL — ABNORMAL LOW (ref >40)
LDL Cholesterol: 75 mg/dL (ref 0–99)
Total CHOL/HDL Ratio: 3.3 {ratio}
Triglycerides: 69 mg/dL (ref <150)
VLDL: 14 mg/dL (ref 0–40)

## 2023-08-07 LAB — CBC
HCT: 38.9 % (ref 36.0–46.0)
Hemoglobin: 13.2 g/dL (ref 12.0–15.0)
MCH: 31.1 pg (ref 26.0–34.0)
MCHC: 33.9 g/dL (ref 30.0–36.0)
MCV: 91.7 fL (ref 80.0–100.0)
Platelets: 194 10*3/uL (ref 150–400)
RBC: 4.24 MIL/uL (ref 3.87–5.11)
RDW: 12.3 % (ref 11.5–15.5)
WBC: 5.7 10*3/uL (ref 4.0–10.5)
nRBC: 0 % (ref 0.0–0.2)

## 2023-08-07 LAB — BASIC METABOLIC PANEL
Anion gap: 6 (ref 5–15)
BUN: 15 mg/dL (ref 8–23)
CO2: 29 mmol/L (ref 22–32)
Calcium: 9.7 mg/dL (ref 8.9–10.3)
Chloride: 103 mmol/L (ref 98–111)
Creatinine, Ser: 0.81 mg/dL (ref 0.44–1.00)
GFR, Estimated: >60 mL/min (ref >60)
Glucose, Bld: 102 mg/dL — ABNORMAL HIGH (ref 70–99)
Potassium: 3.8 mmol/L (ref 3.5–5.1)
Sodium: 138 mmol/L (ref 135–145)

## 2023-08-07 NOTE — Patient Instructions (Signed)
Great to see you today!!!  Labs done today, your results will be available in MyChart, we will contact you for abnormal readings.  CT Coronary Calcium Score: Your physician has recommended that you have CT Coronary Calcium Score. - $99 out of pocket cost at the time of your test  Your physician recommends that you schedule a follow-up appointment in: 6 months (June 2025), **Please call our office in April to schedule this appointment  If you have any questions or concerns before your next appointment please send [place] a message through [place] or call our office at [phone].    TO LEAVE A MESSAGE FOR THE NURSE SELECT OPTION 2, PLEASE LEAVE A MESSAGE INCLUDING: YOUR NAME DATE OF BIRTH CALL BACK NUMBER REASON FOR CALL**this is important as we prioritize the call backs  YOU WILL RECEIVE A CALL BACK THE SAME DAY AS LONG AS YOU CALL BEFORE 4:00 PM  At the [facility], you and your health needs are our priority. As part of our continuing mission to provide you with exceptional heart care, we have created designated Provider Care Teams. These Care Teams include your primary Cardiologist (physician) and Advanced Practice Providers (APPs- Physician Assistants and Nurse Practitioners) who all work together to provide you with the care you need, when you need it.   You may see any of the following providers on your designated Care Team at your next follow up: Dr Arvilla Meres Dr Marca Ancona Dr. Dorthula Nettles Dr. Clearnce Hasten Amy Filbert Schilder, NP Robbie Lis, [place] [facility] [place], [place] Brynda Peon, NP [place] Lee, NP Karle Plumber, PharmD   Please be sure to bring in all your medications bottles to every appointment.    Thank you for choosing [facility]

## 2023-08-08 NOTE — Progress Notes (Signed)
Patient ID: Colleen White, female   DOB: 09/19/1945, 77 y.o.   MRN: [id number] PCP: Dr. Yetta Barre Cardiology: Dr. Shirlee Latch  77 y.o. with history of HTN, hyperlipidemia, paroxysmal atrial fibrillation and prior non-Hodgkins lymphoma presents for followup of atrial fibrillation.  She called the office in 1/17 to report increased episodes of palpitations.  She had had rare tachypalpitations for years, but in 1/17 she had 2 episodes of tachypalpitations close together.  I had her wear an event monitor.  By monitor, her tachypalpitations correlated with atrial fibrillation with RVR.  She was started on Xarelto and Toprol XL.  Toprol XL caused swelling, so it was stopped and diltiazem CD was begun.  Echo in 3/17 showed EF 65-70% with mildly elevated LVOT velocity due to vigorous contraction.  She is doing well today.  She is in NSR.  She will have occasional episodes of palpitations/fluttering but nothing prolonged.  This can be associated with transient chest tightness.  No prolonged chest pain or exertional chest pain.  No exertional dyspnea.  She stays active.   ECG: NSR, poor RWP (personally reviewed)  Labs (4/15): K 3.7, creatinine 0.6, LDL 111, HDL 46 Labs (4/16): LDL 89 Labs (3/17): K 3.8, creatinine 0.7, HCT 37.3 Labs (4/17): LDL 93, HDL 46 Labs (8/17): hgb 13.1, K 3.4, creatinine 0.7 Labs (4/18): LDL 99, hgb 13.7 Labs (10/18): K 3.7, creatinine 0.75 Labs (6/19): LDL 97 Labs (8/19): K 3.9, creatinine 0.84 Labs (8/20): K 3.9, creatinine 0.8, LDL 88 Labs (3/21): LDL 73 Labs (6/21): K 3.4, creatinine 0.73 Labs (11/21): LDL 87, K 3.6, creatinine 7.82 Labs (12/21): hgb 12.9 Labs (9/22): K 4.1, creatinine 0.79 Labs (11/22): LDL 85, K 4, creatinine 0.67, TSH normal, hgb 13.2 Labs (6/23): LDL 75 Labs (8/23): K 4.5, creatinine 0.88 Labs (8/24): K 3.8, creatinine 0.73  PMH: 1. Echo (11/15): EF 60-65%, trivial MR. Echo (3/17) with EF 65-70%, mildly elevated LVOT velocity due to vigorous  contraction. 2. Chest pain: Myoview normal in 2/05. Stress echo (11/15) with no evidence for ischemia or infarction.  3. Suspected glaucoma 4. HTN 5. Hyperlipidemia 6. GERD 7. Diverticulosis 8. TAH/BSO 9. Non-Hodgkins lymphoma: In remission.  Treated at [facility] with surgery and XRT.  Did not have chemotherapy.  10. Pulmonary nodules: Presumed benign after observation.  11. Atrial fibrillation: Paroxysmal.  12. OSA: Uses CPAP.  13. COVID-19 9/22 14. Spondylolisthesis  SH: Married, cares for elderly mother, nonsmoker.  Lives in [place].  FH: Mother with atrial fibrillation and CAD.   ROS: All systems reviewed and negative except as per HPI.   Current Outpatient Medications  Medication Sig Dispense Refill   Ascorbic Acid (VITAMIN C PO) Take 500 mg by mouth daily.     brimonidine-timolol (COMBIGAN) 0.2-0.5 % ophthalmic solution Place 1 drop into both eyes every 12 (twelve) hours.      calcium citrate (CALCITRATE - DOSED IN MG ELEMENTAL CALCIUM) 950 (200 Ca) MG tablet Take 200 mg of elemental calcium by mouth 2 (two) times daily.     chlorpheniramine-HYDROcodone (TUSSIONEX) 10-8 MG/5ML Take 5 mLs by mouth every 12 (twelve) hours as needed for cough. 115 mL 0   Cholecalciferol (VITAMIN D3) 1000 units CAPS Take 1,000 Units by mouth.      Coenzyme Q10 (CO Q 10) 100 MG CAPS Take 1 tablet by mouth daily.      dicyclomine (BENTYL) 10 MG capsule Take 10 mg by mouth as needed for spasms.     diltiazem (CARDIZEM CD) 120 MG 24 hr capsule  TAKE ONE CAPSULE BY MOUTH DAILY 90 capsule 3   docusate sodium (COLACE) 100 MG capsule Take 200 mg by mouth as needed.     esomeprazole (NEXIUM) 20 MG packet Take 20 mg by mouth at bedtime. Takes as needed.     fluocinonide (LIDEX) 0.05 % external solution Apply 1 Application topically as directed. Itchy Scalp     gabapentin (NEURONTIN) 100 MG capsule Take 3 capsules (300 mg total) by mouth at bedtime. 270 capsule 1   loratadine (CLARITIN) 10 MG tablet Take by  mouth daily as needed.      Multiple Vitamins-Minerals (CENTRUM SILVER PO) Take 1 tablet by mouth daily.     Multiple Vitamins-Minerals (PRESERVISION AREDS 2 PO) Take 1 tablet by mouth 2 (two) times daily.      potassium chloride (MICRO-K) 10 MEQ CR capsule Take 1 capsule (10 mEq total) by mouth 2 (two) times daily. 180 capsule 1   pravastatin (PRAVACHOL) 40 MG tablet TAKE ONE TABLET BY MOUTH AT BEDTIME 90 tablet 1   Probiotic Product (PROBIOTIC DAILY PO) Take 1 tablet by mouth daily.     rivaroxaban (XARELTO) 20 MG TABS tablet TAKE ONE TABLET BY MOUTH with SUPPER 30 tablet 11   telmisartan-hydrochlorothiazide (MICARDIS HCT) 80-12.5 MG tablet Take 1 tablet by mouth daily. 90 tablet 1   tiZANidine (ZANAFLEX) 4 MG tablet Take 1 tablet (4 mg total) by mouth every 8 (eight) hours as needed for muscle spasms. 90 tablet 3   traMADol (ULTRAM) 50 MG tablet Take 1 tablet (50 mg total) by mouth every 6 (six) hours as needed. 65 tablet 3   tretinoin (RETIN-A) 0.05 % cream Once daily     MINOXIDIL FOR MEN EX Apply topically. Takes as needed at night.     mirtazapine (REMERON) 15 MG tablet Take 7.5 mg by mouth as needed. Takes 1/4 tablet at bedtime for itching of scalp     No current facility-administered medications for this encounter.    BP 128/78   Pulse (!) 57   Wt 73.5 kg (162 lb 1.6 oz)   SpO2 96%   BMI 28.71 kg/m  General: NAD Neck: No JVD, no thyromegaly or thyroid nodule.  Lungs: Clear to auscultation bilaterally with normal respiratory effort. CV: Nondisplaced PMI.  Heart regular S1/S2, no S3/S4, no murmur.  No peripheral edema.  No carotid bruit.  Normal pedal pulses.  Abdomen: Soft, nontender, no hepatosplenomegaly, no distention.  Skin: Intact without lesions or rashes.  Neurologic: Alert and oriented x 3.  Psych: Normal affect. Extremities: No clubbing or cyanosis.  HEENT: Normal.   Assessment/Plan: 1. HTN: BP controlled on current regimen.  - Continue telmisartan/HCTZ and  diltiazem CD.  BMET today.  2. Hyperlipidemia: She is on pravastatin, check lipids today.    3. Atrial fibrillation: Paroxysmal.  First noted in 1/17.  Rare palpitations.   CHADSVASC = 3.  Best route for prevention will be to continue to control OSA and HTN.  - Continue diltiazem CD 120 daily.  - Continue Xarelto 20 daily. BMET and CBC today. - Continue CPAP 4. OSA: Using CPAP.  5. Cardiac disease risk:  She has some atypical, likely noncardiac, chest pain at times.  We discussed coronary calcium scoring today, she is interested in this for risk stratification.   - I will arrange for a coronary artery calcium score scan.   Followup in 1 year.   Marca Ancona 08/08/2023

## 2023-08-13 DIAGNOSIS — H353121 Nonexudative age-related macular degeneration, left eye, early dry stage: Secondary | ICD-10-CM | POA: Diagnosis not present

## 2023-08-13 DIAGNOSIS — H401131 Primary open-angle glaucoma, bilateral, mild stage: Secondary | ICD-10-CM | POA: Diagnosis not present

## 2023-08-13 DIAGNOSIS — H04123 Dry eye syndrome of bilateral lacrimal glands: Secondary | ICD-10-CM | POA: Diagnosis not present

## 2023-08-13 DIAGNOSIS — Z961 Presence of intraocular lens: Secondary | ICD-10-CM | POA: Diagnosis not present

## 2023-08-15 ENCOUNTER — Other Ambulatory Visit: Payer: Self-pay | Admitting: Internal Medicine

## 2023-08-15 ENCOUNTER — Other Ambulatory Visit (HOSPITAL_COMMUNITY): Payer: Medicare Other

## 2023-08-15 DIAGNOSIS — M150 Primary generalized (osteo)arthritis: Secondary | ICD-10-CM

## 2023-08-22 ENCOUNTER — Ambulatory Visit (HOSPITAL_COMMUNITY): Payer: Medicare Other

## 2023-08-22 DIAGNOSIS — H40053 Ocular hypertension, bilateral: Secondary | ICD-10-CM | POA: Diagnosis not present

## 2023-08-26 ENCOUNTER — Other Ambulatory Visit: Payer: Self-pay | Admitting: Internal Medicine

## 2023-08-26 DIAGNOSIS — E876 Hypokalemia: Secondary | ICD-10-CM

## 2023-08-26 DIAGNOSIS — I10 Essential (primary) hypertension: Secondary | ICD-10-CM

## 2023-09-04 ENCOUNTER — Ambulatory Visit (HOSPITAL_COMMUNITY): Payer: Medicare Other

## 2023-09-08 ENCOUNTER — Encounter: Payer: Self-pay | Admitting: Internal Medicine

## 2023-09-09 ENCOUNTER — Other Ambulatory Visit: Payer: Self-pay | Admitting: Internal Medicine

## 2023-09-09 ENCOUNTER — Other Ambulatory Visit (HOSPITAL_COMMUNITY): Payer: Medicare Other

## 2023-09-09 DIAGNOSIS — J208 Acute bronchitis due to other specified organisms: Secondary | ICD-10-CM | POA: Insufficient documentation

## 2023-09-09 DIAGNOSIS — R053 Chronic cough: Secondary | ICD-10-CM

## 2023-09-09 MED ORDER — HYDROCOD POLI-CHLORPHE POLI ER 10-8 MG/5ML PO SUER: 5.0000 mL | Freq: Two times a day (BID) | ORAL | 0 refills | Status: DC | PRN

## 2023-09-16 ENCOUNTER — Ambulatory Visit: Payer: Medicare Other | Admitting: Internal Medicine

## 2023-09-23 ENCOUNTER — Other Ambulatory Visit: Payer: Self-pay | Admitting: Internal Medicine

## 2023-09-23 DIAGNOSIS — I10 Essential (primary) hypertension: Secondary | ICD-10-CM

## 2023-09-23 DIAGNOSIS — M25561 Pain in right knee: Secondary | ICD-10-CM | POA: Diagnosis not present

## 2023-09-23 DIAGNOSIS — M25562 Pain in left knee: Secondary | ICD-10-CM | POA: Diagnosis not present

## 2023-09-24 ENCOUNTER — Ambulatory Visit: Payer: Medicare Other | Attending: Cardiology | Admitting: Cardiology

## 2023-09-24 ENCOUNTER — Encounter: Payer: Self-pay | Admitting: Cardiology

## 2023-09-24 VITALS — BP 120/80 | HR 78 | Ht 63.0 in | Wt 163.2 lb

## 2023-09-24 DIAGNOSIS — I10 Essential (primary) hypertension: Secondary | ICD-10-CM | POA: Insufficient documentation

## 2023-09-24 DIAGNOSIS — G4733 Obstructive sleep apnea (adult) (pediatric): Secondary | ICD-10-CM | POA: Diagnosis not present

## 2023-09-24 NOTE — Patient Instructions (Signed)
Medication Instructions:  Your physician recommends that you continue on your current medications as directed. Please refer to the Current Medication list given to you today.  *If you need a refill on your cardiac medications before your next appointment, please call your pharmacy*   Lab Work: None.  If you have labs (blood work) drawn today and your tests are completely normal, you will receive your results only by: MyChart Message (if you have MyChart) OR A paper copy in the mail If you have any lab test that is abnormal or we need to change your treatment, we will call you to review the results.   Testing/Procedures: None.   Follow-Up: an do with MyChart, go to [email or web address].    Your next appointment:   1 year(s)  Provider:   Dr. Armanda Magic, MD

## 2023-09-24 NOTE — Progress Notes (Signed)
Date:  09/24/2023   ID:  Colleen White, DOB 1945/10/03, MRN [id number]   PCP:  Etta Grandchild, MD  Cardiologist:  Marca Ancona, MD Sleep Medicine:  Armanda Magic, MD Electrophysiologist:  None   Chief Complaint:  OSA  History of Present Illness:    Colleen White is a 78 y.o. female with a hx of mild OSA with an AHI of 9.1/hr and is on CPAP at 7cm H2O.   She is doing well with her PAP device and thinks that she has gotten used to it.  She tolerates the mask and feels the pressure is adequate.  Since going on PAP she feels rested in the am and has no significant daytime sleepiness.  She denies any significant mouth or nasal dryness or nasal congestion.  She does not think that she snores.    Prior CV studies:   The following studies were reviewed today:  PAP compliance download  Past Medical History:  Diagnosis Date   Allergic rhinitis, cause unspecified    Alopecia    Anxiety    Colon polyps    Diverticulosis of colon (without mention of hemorrhage)    Fibrocystic breast disease    GERD (gastroesophageal reflux disease)    Glaucoma    Headache(784.0)    Hemorrhoids    History of echocardiogram    Echo 3/17:  Vigorous LVF, EF 65-70%, normal wall motion, grade 1 diastolic, mildly elevated LVOT velocity of 2.2 m/s-likely related to vigorous LVF   History of stress test    Myoview normal in 2/05. //  Stress echo (11/15) with no evidence for ischemia or infarction.   HLD (hyperlipidemia)    HTN (hypertension)    Hypertension    Mitral valve disorders(424.0)    Non Hodgkin's lymphoma (HCC)    Treated at [facility] with surgery and XRT.  Did not have chemotherapy.    OSA (obstructive sleep apnea) 03/29/2016   Osteopenia    PAF (paroxysmal atrial fibrillation) (HCC)    a. CHADS2-VASc=3 //  b. Xarelto started in 2017   Pulmonary nodule    Presumed benign after observation.    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 Chauncey Mann, [facility]   s/p ORIF prox phalanx  02/2007   right little finger----Dr Merlyn Lot     Current Meds  Medication Sig   Ascorbic Acid (VITAMIN C PO) Take 500 mg by mouth daily.   brimonidine-timolol (COMBIGAN) 0.2-0.5 % ophthalmic solution Place 1 drop into both eyes every 12 (twelve) hours.    calcium citrate (CALCITRATE - DOSED IN MG ELEMENTAL CALCIUM) 950 (200 Ca) MG tablet Take 200 mg of elemental calcium by mouth 2 (two) times daily.   chlorpheniramine-HYDROcodone (TUSSIONEX) 10-8 MG/5ML Take 5 mLs by mouth every 12 (twelve) hours as needed for cough.   Cholecalciferol (VITAMIN D3) 1000 units CAPS Take 1,000 Units by mouth.    Coenzyme Q10 (CO Q 10) 100 MG CAPS Take 1 tablet by mouth daily.    dicyclomine (BENTYL) 10 MG capsule Take 10 mg by mouth as needed for spasms.  diltiazem (CARDIZEM CD) 120 MG 24 hr capsule TAKE ONE CAPSULE BY MOUTH DAILY   docusate sodium (COLACE) 100 MG capsule Take 200 mg by mouth as needed.   esomeprazole (NEXIUM) 20 MG packet Take 20 mg by mouth at bedtime. Takes as needed.   fluocinonide (LIDEX) 0.05 % external solution Apply 1 Application topically as directed. Itchy Scalp   gabapentin (NEURONTIN) 100 MG capsule Take 3 capsules (300 mg total) by mouth at bedtime.   loratadine (CLARITIN) 10 MG tablet Take by mouth daily as needed.    MINOXIDIL FOR MEN EX Apply topically. Takes as needed at night.   Multiple Vitamins-Minerals (CENTRUM SILVER PO) Take 1 tablet by mouth daily.   Multiple Vitamins-Minerals (PRESERVISION AREDS 2 PO) Take 1 tablet by mouth 2 (two) times daily.    potassium chloride (MICRO-K) 10 MEQ CR capsule Take 1 capsule (10 mEq total) by mouth 2 (two) times daily.    pravastatin (PRAVACHOL) 40 MG tablet TAKE ONE TABLET BY MOUTH AT BEDTIME   rivaroxaban (XARELTO) 20 MG TABS tablet TAKE ONE TABLET BY MOUTH with SUPPER   telmisartan-hydrochlorothiazide (MICARDIS HCT) 80-12.5 MG tablet Take 1 tablet by mouth daily.   traMADol (ULTRAM) 50 MG tablet Take 1 tablet (50 mg total) by mouth every 6 (six) hours as needed.   tretinoin (RETIN-A) 0.05 % cream Once daily     Allergies:   Ibuprofen   Social History   Tobacco Use   Smoking status: Former    Current packs/day: 0.00    Types: Cigarettes    Quit date: 08/27/1974    Years since quitting: 49.1    Passive exposure: Never   Smokeless tobacco: Never  Vaping Use   Vaping status: Never Used  Substance Use Topics   Alcohol use: No   Drug use: No     Family Hx: The patient's family history includes Breast cancer in her mother and sister; Colon cancer in her maternal uncle; Diabetes in her father; Esophageal cancer in her father; Heart disease in her father and mother; Hypertension in her mother; Prostate cancer in her father.  ROS:   Please see the history of present illness.     All other systems reviewed and are negative.   Labs/Other Tests and Data Reviewed:    Recent Labs: 02/13/2023: ALT 16; Magnesium 2.1; TSH 2.31 08/07/2023: BUN 15; Creatinine, Ser 0.81; Hemoglobin 13.2; Platelets 194; Potassium 3.8; Sodium 138   Recent Lipid Panel Lab Results  Component Value Date/Time   CHOL 128 08/07/2023 11:34 AM   TRIG 69 08/07/2023 11:34 AM   HDL 39 (L) 08/07/2023 11:34 AM   CHOLHDL 3.3 08/07/2023 11:34 AM   LDLCALC 75 08/07/2023 11:34 AM   LDLDIRECT 142.5 09/30/2012 11:10 AM    Wt Readings from Last 3 Encounters:  09/24/23 163 lb 3.2 oz (74 kg)  08/07/23 162 lb 1.6 oz (73.5 kg)  02/26/23 159 lb (72.1 kg)     Objective:    Vital Signs:  BP 120/80 (BP Location: Left Arm, Patient Position: Sitting, Cuff Size: Normal)   Pulse 78   Ht 5\' 3"  (1.6 m)   Wt 163 lb 3.2 oz (74 kg)   SpO2 98%    BMI 28.91 kg/m   GEN: Well nourished, well developed in no acute distress HEENT: Normal NECK: No JVD; No carotid bruits LYMPHATICS: No lymphadenopathy CARDIAC:RRR, no murmurs, rubs, gallops RESPIRATORY:  Clear to auscultation without rales, wheezing or rhonchi  ABDOMEN: Soft, non-tender, non-distended MUSCULOSKELETAL:  No edema; No  deformity  SKIN: Warm and dry NEUROLOGIC:  Alert and oriented x 3 PSYCHIATRIC:  Normal affect   ASSESSMENT & PLAN:    OSA - The patient is tolerating PAP therapy well without any problems. The PAP download performed by his DME was personally reviewed and interpreted by me today and showed an AHI of 3/hr on 7 cm H2O with 97% compliance in using more than 4 hours nightly.  The patient has been using and benefiting from PAP use and will continue to benefit from therapy.    HTN -BP controlled on exam  -continue prescription drug management with Cardizem CD 120mg  daily, Telmisartan-HCt 80-12.5mg  daily with PRN refills -I have personally reviewed and interpreted outside labs performed by patient's PCP which showed serum creatinine 0.81 and potassium 3.8 on 08/07/2023   Medication Adjustments/Labs and Tests Ordered: Current medicines are reviewed at length with the patient today.  Concerns regarding medicines are outlined above.  Tests Ordered: No orders of the defined types were placed in this encounter.   Medication Changes: No orders of the defined types were placed in this encounter.    Disposition:  Follow up in 1 year(s)  Signed, Armanda Magic, MD  09/24/2023 10:46 AM    [facility]

## 2023-09-30 ENCOUNTER — Other Ambulatory Visit (HOSPITAL_COMMUNITY): Payer: Medicare Other

## 2023-10-02 ENCOUNTER — Ambulatory Visit: Payer: Medicare Other | Admitting: Internal Medicine

## 2023-10-02 ENCOUNTER — Encounter: Payer: Self-pay | Admitting: Internal Medicine

## 2023-10-02 VITALS — BP 142/78 | HR 90 | Temp 98.1°F | Resp 16 | Ht 63.0 in | Wt 164.0 lb

## 2023-10-02 DIAGNOSIS — I10 Essential (primary) hypertension: Secondary | ICD-10-CM | POA: Diagnosis not present

## 2023-10-02 DIAGNOSIS — E7800 Pure hypercholesterolemia, unspecified: Secondary | ICD-10-CM

## 2023-10-02 DIAGNOSIS — I480 Paroxysmal atrial fibrillation: Secondary | ICD-10-CM

## 2023-10-02 DIAGNOSIS — Z23 Encounter for immunization: Secondary | ICD-10-CM | POA: Insufficient documentation

## 2023-10-02 MED ORDER — BOOSTRIX 5-2.5-18.5 LF-MCG/0.5 IM SUSP: 0.5000 mL | Freq: Once | INTRAMUSCULAR | 0 refills | Status: AC

## 2023-10-02 NOTE — Progress Notes (Signed)
 Subjective:  Patient ID: Colleen White, female    DOB: [birth year]  Age: 78 y.o. MRN: [id number]  CC: Hypertension   HPI Colleen White presents for f/up ---  Discussed the use of AI scribe software for clinical note transcription with the patient, who gave verbal consent to proceed.  History of Present Illness   Colleen White is a 78 year old female who presents for routine follow-up and vaccination updates.  She recently received a flu shot, which was due in December 2024, and inquired about receiving a tetanus vaccine, noting her last one was in December 2014.  She has recovered from COVID-19, experiencing minimal lingering cough. No hemoptysis, dyspnea, fevers, chills, or night sweats. She feels exhausted by the afternoon but did not experience significant weight loss during her illness. Her weight is around 160 pounds at home and 164 pounds at the clinic.  She is currently taking prednisone , prescribed after receiving cortisone injections in both knees last week, with about six more days remaining. She continues to take tramadol  for pain management, as she cannot take NSAIDs due to her use of Xarelto . No recent palpitations have been experienced.  She is scheduled for a breast MRI tomorrow and had a normal mammogram in June 2024, although she has dense fibrous tissue. She is also planning to have a bone density test, last done in 2022.       Outpatient Medications Prior to Visit  Medication Sig Dispense Refill   Ascorbic Acid (VITAMIN C PO) Take 500 mg by mouth daily.     brimonidine-timolol (COMBIGAN) 0.2-0.5 % ophthalmic solution Place 1 drop into both eyes every 12 (twelve) hours.      calcium citrate (CALCITRATE - DOSED IN MG ELEMENTAL CALCIUM) 950 (200 Ca) MG tablet Take 200 mg of elemental calcium by mouth 2 (two) times daily.     chlorpheniramine-HYDROcodone  (TUSSIONEX) 10-8 MG/5ML Take 5 mLs by mouth every 12 (twelve) hours as needed for cough. 115 mL 0    Cholecalciferol  (VITAMIN D3) 1000 units CAPS Take 1,000 Units by mouth.      Coenzyme Q10 (CO Q 10) 100 MG CAPS Take 1 tablet by mouth daily.      dicyclomine  (BENTYL ) 10 MG capsule Take 10 mg by mouth as needed for spasms.     diltiazem  (CARDIZEM  CD) 120 MG 24 hr capsule TAKE ONE CAPSULE BY MOUTH DAILY 90 capsule 3   docusate sodium (COLACE) 100 MG capsule Take 200 mg by mouth as needed.     esomeprazole  (NEXIUM ) 20 MG packet Take 20 mg by mouth at bedtime. Takes as needed.     fluocinonide (LIDEX) 0.05 % external solution Apply 1 Application topically as directed. Itchy Scalp     gabapentin  (NEURONTIN ) 100 MG capsule Take 3 capsules (300 mg total) by mouth at bedtime. 270 capsule 1   loratadine (CLARITIN) 10 MG tablet Take by mouth daily as needed.      MINOXIDIL FOR MEN EX Apply topically. Takes as needed at night.     mirtazapine  (REMERON ) 15 MG tablet Take 7.5 mg by mouth as needed. Takes 1/4 tablet at bedtime for itching of scalp (Patient not taking: Reported on 09/24/2023)     Multiple Vitamins-Minerals (CENTRUM SILVER PO) Take 1 tablet by mouth daily.     Multiple Vitamins-Minerals (PRESERVISION AREDS 2 PO) Take 1 tablet by mouth 2 (two) times daily.      potassium chloride  (MICRO-K ) 10 MEQ CR capsule Take 1 capsule (10 mEq total) by mouth  2 (two) times daily. 180 capsule 0   pravastatin  (PRAVACHOL ) 40 MG tablet TAKE ONE TABLET BY MOUTH AT BEDTIME 90 tablet 1   predniSONE  (DELTASONE ) 10 MG tablet Take 10 mg by mouth daily at 6 (six) AM. (Patient not taking: Reported on 09/24/2023)     Probiotic Product (PROBIOTIC DAILY PO) Take 1 tablet by mouth daily. (Patient not taking: Reported on 09/24/2023)     rivaroxaban  (XARELTO ) 20 MG TABS tablet TAKE ONE TABLET BY MOUTH with SUPPER 30 tablet 11   telmisartan -hydrochlorothiazide (MICARDIS  HCT) 80-12.5 MG tablet Take 1 tablet by mouth daily. 90 tablet 1   tiZANidine  (ZANAFLEX ) 4 MG tablet Take 1 tablet (4 mg total) by mouth every 8 (eight) hours as  needed for muscle spasms. (Patient not taking: Reported on 09/24/2023) 90 tablet 3   traMADol  (ULTRAM ) 50 MG tablet Take 1 tablet (50 mg total) by mouth every 6 (six) hours as needed. 65 tablet 2   tretinoin (RETIN-A) 0.05 % cream Once daily     No facility-administered medications prior to visit.    ROS Review of Systems  Constitutional: Negative.  Negative for chills, fatigue and fever.  HENT: Negative.  Negative for sore throat and trouble swallowing.   Eyes: Negative.   Respiratory:  Positive for cough. Negative for choking, chest tightness, shortness of breath and wheezing.   Cardiovascular:  Negative for chest pain, palpitations and leg swelling.  Gastrointestinal:  Negative for abdominal pain, constipation, diarrhea, nausea and vomiting.  Endocrine: Negative.   Genitourinary: Negative.   Musculoskeletal: Negative.  Negative for arthralgias and myalgias.  Skin: Negative.   Neurological: Negative.   Hematological: Negative.   Psychiatric/Behavioral: Negative.      Objective:  BP (!) 142/78 (BP Location: Right Arm, Patient Position: Sitting, Cuff Size: Normal)   Pulse 90   Temp 98.1 F (36.7 C) (Oral)   Resp 16   Ht 5' 3 (1.6 m)   Wt 164 lb (74.4 kg)   SpO2 92%   BMI 29.05 kg/m   BP Readings from Last 3 Encounters:  10/02/23 (!) 142/78  09/24/23 120/80  08/07/23 128/78    Wt Readings from Last 3 Encounters:  10/02/23 164 lb (74.4 kg)  09/24/23 163 lb 3.2 oz (74 kg)  08/07/23 162 lb 1.6 oz (73.5 kg)    Physical Exam Vitals reviewed.  Constitutional:      General: She is not in acute distress.    Appearance: Normal appearance. She is not ill-appearing, toxic-appearing or diaphoretic.  HENT:     Mouth/Throat:     Mouth: Mucous membranes are moist.  Eyes:     General: No scleral icterus.    Conjunctiva/sclera: Conjunctivae normal.  Cardiovascular:     Rate and Rhythm: Normal rate and regular rhythm.     Heart sounds: No murmur heard.    No friction rub.  No gallop.  Pulmonary:     Effort: Pulmonary effort is normal.     Breath sounds: No stridor. No wheezing, rhonchi or rales.  Abdominal:     General: Abdomen is flat.     Palpations: There is no mass.     Tenderness: There is no abdominal tenderness. There is no guarding.     Hernia: No hernia is present.  Musculoskeletal:        General: Normal range of motion.     Cervical back: Neck supple.     Right lower leg: No edema.     Left lower leg: No edema.  Skin:    General: Skin is warm and dry.     Findings: No rash.  Neurological:     General: No focal deficit present.     Mental Status: She is alert. Mental status is at baseline.  Psychiatric:        Mood and Affect: Mood normal.        Behavior: Behavior normal.     Lab Results  Component Value Date   WBC 5.7 08/07/2023   HGB 13.2 08/07/2023   HCT 38.9 08/07/2023   PLT 194 08/07/2023   GLUCOSE 102 (H) 08/07/2023   CHOL 128 08/07/2023   TRIG 69 08/07/2023   HDL 39 (L) 08/07/2023   LDLDIRECT 142.5 09/30/2012   LDLCALC 75 08/07/2023   ALT 16 02/13/2023   AST 21 02/13/2023   NA 138 08/07/2023   K 3.8 08/07/2023   CL 103 08/07/2023   CREATININE 0.81 08/07/2023   BUN 15 08/07/2023   CO2 29 08/07/2023   TSH 2.31 02/13/2023   INR 1.7 (H) 03/06/2021   HGBA1C 5.8 09/10/2018    No results found.  Assessment & Plan:  Flu vaccine need -     Flu Vaccine Trivalent High Dose (Fluad)  Essential hypertension- Her BP is well controlled.  Paroxysmal atrial fibrillation (HCC)- She has good R/R control.  HYPERCHOLESTEROLEMIA - LDL goal achieved. Doing well on the statin   Need for prophylactic vaccination with combined diphtheria-tetanus-pertussis (DTP) vaccine -     Boostrix ; Inject 0.5 mLs into the muscle once for 1 dose.  Dispense: 0.5 mL; Refill: 0     Follow-up: No follow-ups on file.  Debby Molt, MD

## 2023-10-03 DIAGNOSIS — C8599 Non-Hodgkin lymphoma, unspecified, extranodal and solid organ sites: Secondary | ICD-10-CM | POA: Diagnosis not present

## 2023-10-03 DIAGNOSIS — Z9189 Other specified personal risk factors, not elsewhere classified: Secondary | ICD-10-CM | POA: Diagnosis not present

## 2023-10-03 DIAGNOSIS — Z1239 Encounter for other screening for malignant neoplasm of breast: Secondary | ICD-10-CM | POA: Diagnosis not present

## 2023-10-08 DIAGNOSIS — C8599 Non-Hodgkin lymphoma, unspecified, extranodal and solid organ sites: Secondary | ICD-10-CM | POA: Diagnosis not present

## 2023-10-08 DIAGNOSIS — Z1231 Encounter for screening mammogram for malignant neoplasm of breast: Secondary | ICD-10-CM | POA: Diagnosis not present

## 2023-10-08 DIAGNOSIS — Z9189 Other specified personal risk factors, not elsewhere classified: Secondary | ICD-10-CM | POA: Diagnosis not present

## 2023-10-11 ENCOUNTER — Ambulatory Visit (HOSPITAL_COMMUNITY)
Admission: RE | Admit: 2023-10-11 | Discharge: 2023-10-11 | Disposition: A | Discharge status: Home / Self Care | Payer: Self-pay | Source: Ambulatory Visit | Attending: Cardiology | Admitting: Cardiology

## 2023-10-11 ENCOUNTER — Encounter (HOSPITAL_COMMUNITY): Payer: Self-pay

## 2023-10-11 DIAGNOSIS — E7800 Pure hypercholesterolemia, unspecified: Secondary | ICD-10-CM | POA: Insufficient documentation

## 2023-10-30 ENCOUNTER — Other Ambulatory Visit (HOSPITAL_COMMUNITY): Payer: Medicare Other

## 2023-11-04 DIAGNOSIS — R519 Headache, unspecified: Secondary | ICD-10-CM | POA: Diagnosis not present

## 2023-11-04 DIAGNOSIS — H40053 Ocular hypertension, bilateral: Secondary | ICD-10-CM | POA: Diagnosis not present

## 2023-11-08 DIAGNOSIS — M79642 Pain in left hand: Secondary | ICD-10-CM | POA: Diagnosis not present

## 2023-11-08 DIAGNOSIS — S63617A Unspecified sprain of left little finger, initial encounter: Secondary | ICD-10-CM | POA: Diagnosis not present

## 2023-11-08 DIAGNOSIS — S63619A Unspecified sprain of unspecified finger, initial encounter: Secondary | ICD-10-CM | POA: Insufficient documentation

## 2023-11-25 ENCOUNTER — Other Ambulatory Visit: Payer: Self-pay | Admitting: Internal Medicine

## 2023-11-25 DIAGNOSIS — M150 Primary generalized (osteo)arthritis: Secondary | ICD-10-CM

## 2023-12-04 ENCOUNTER — Other Ambulatory Visit: Payer: Self-pay | Admitting: Internal Medicine

## 2023-12-04 ENCOUNTER — Ambulatory Visit (INDEPENDENT_AMBULATORY_CARE_PROVIDER_SITE_OTHER): Payer: Medicare Other

## 2023-12-04 VITALS — BP 110/62 | HR 78 | Ht 63.5 in | Wt 158.4 lb

## 2023-12-04 DIAGNOSIS — Z0000 Encounter for general adult medical examination without abnormal findings: Secondary | ICD-10-CM | POA: Diagnosis not present

## 2023-12-04 DIAGNOSIS — E7800 Pure hypercholesterolemia, unspecified: Secondary | ICD-10-CM

## 2023-12-04 NOTE — Patient Instructions (Signed)
 [name] , Thank you for taking time to come for your Medicare Wellness Visit. I appreciate your ongoing commitment to your health goals. Please review the following plan we discussed and let me know if I can assist you in the future.   Referrals/Orders/Follow-Ups/Clinician Recommendations: Aim for 30 minutes of exercise or brisk walking, 6-8 glasses of water, and 5 servings of fruits and vegetables each day.   This is a list of the screening recommended for you and due dates:  Health Maintenance  Topic Date Due   DTaP/Tdap/Td vaccine (2 - Td or Tdap) 09/30/2022   COVID-19 Vaccine (6 - 2024-25 season) 04/28/2023   Flu Shot  03/27/2024   Medicare Annual Wellness Visit  12/03/2024   Pneumonia Vaccine  Completed   DEXA scan (bone density measurement)  Completed   Hepatitis C Screening  Completed   Zoster (Shingles) Vaccine  Completed   HPV Vaccine  Aged Out   Colon Cancer Screening  Discontinued    Advanced directives: (Copy Requested) Please bring a copy of your health care power of attorney and living will to the office to be added to your chart at your convenience. You can mail to [facility] [address] [place], [place] [address] or email to [email or web address]  Next Medicare Annual Wellness Visit scheduled for next year: Yes   Managing Pain Without Opioids Opioids are strong medicines used to treat moderate to severe pain. For some people, especially those who have long-term (chronic) pain, opioids may not be the best choice for pain management due to: Side effects like nausea, constipation, and sleepiness. The risk of addiction (opioid use disorder). The longer you take opioids, the greater your risk of addiction. Pain that lasts for more than 3 months is called chronic pain. Managing chronic pain usually requires more than one approach and is often provided by a team of health care providers working together (multidisciplinary approach). Pain management  may be done at a pain management center or pain clinic. How to manage pain without the use of opioids Use non-opioid medicines Non-opioid medicines for pain may include: Over-the-counter or prescription non-steroidal anti-inflammatory drugs (NSAIDs). These may be the first medicines used for pain. They work well for muscle and bone pain, and they reduce swelling. Acetaminophen. This over-the-counter medicine may work well for milder pain but not swelling. Antidepressants. These may be used to treat chronic pain. A certain type of antidepressant (tricyclics) is often used. These medicines are given in lower doses for pain than when used for depression. Anticonvulsants. These are usually used to treat seizures but may also reduce nerve (neuropathic) pain. Muscle relaxants. These relieve pain caused by sudden muscle tightening (spasms). You may also use a pain medicine that is applied to the skin as a patch, cream, or gel (topical analgesic), such as a numbing medicine. These may cause fewer side effects than medicines taken by mouth. Do certain therapies as directed Some therapies can help with pain management. They include: Physical therapy. You will do exercises to gain strength and flexibility. A physical therapist may teach you exercises to move and stretch parts of your body that are weak, stiff, or painful. You can learn these exercises at physical therapy visits and practice them at home. Physical therapy may also involve: Massage. Heat wraps or applying heat or cold to affected areas. Electrical signals that interrupt pain signals (transcutaneous electrical nerve stimulation, TENS). Weak lasers that reduce pain and swelling (low-level laser therapy). Signals from your body  that help you learn to regulate pain (biofeedback). Occupational therapy. This helps you to learn ways to function at home and work with less pain. Recreational therapy. This involves trying new activities or hobbies, such  as a physical activity or drawing. Mental health therapy, including: Cognitive behavioral therapy (CBT). This helps you learn coping skills for dealing with pain. Acceptance and commitment therapy (ACT) to change the way you think and react to pain. Relaxation therapies, including muscle relaxation exercises and mindfulness-based stress reduction. Pain management counseling. This may be individual, family, or group counseling.  Receive medical treatments Medical treatments for pain management include: Nerve block injections. These may include a pain blocker and anti-inflammatory medicines. You may have injections: Near the spine to relieve chronic back or neck pain. Into joints to relieve back or joint pain. Into nerve areas that supply a painful area to relieve body pain. Into muscles (trigger point injections) to relieve some painful muscle conditions. A medical device placed near your spine to help block pain signals and relieve nerve pain or chronic back pain (spinal cord stimulation device). Acupuncture. Follow these instructions at home Medicines Take over-the-counter and prescription medicines only as told by your health care provider. If you are taking pain medicine, ask your health care providers about possible side effects to watch out for. Do not drive or use heavy machinery while taking prescription opioid pain medicine. Lifestyle  Do not use drugs or alcohol to reduce pain. If you drink alcohol, limit how much you have to: 0-1 drink a day for women who are not pregnant. 0-2 drinks a day for men. Know how much alcohol is in a drink. In the [place] one drink equals one 12 oz bottle of beer (355 mL), one 5 oz glass of wine (148 mL), or one 1 oz glass of hard liquor (44 mL). Do not use any products that contain nicotine or tobacco. These products include cigarettes, chewing tobacco, and vaping devices, such as e-cigarettes. If you need help quitting, ask your health care  provider. Eat a healthy diet and maintain a healthy weight. Poor diet and excess weight may make pain worse. Eat foods that are high in fiber. These include fresh fruits and vegetables, whole grains, and beans. Limit foods that are high in fat and processed sugars, such as fried and sweet foods. Exercise regularly. Exercise lowers stress and may help relieve pain. Ask your health care provider what activities and exercises are safe for you. If your health care provider approves, join an exercise class that combines movement and stress reduction. Examples include yoga and tai chi. Get enough sleep. Lack of sleep may make pain worse. Lower stress as much as possible. Practice stress reduction techniques as told by your therapist. General instructions Work with all your pain management providers to find the treatments that work best for you. You are an important member of your pain management team. There are many things you can do to reduce pain on your own. Consider joining an online or in-person support group for people who have chronic pain. Keep all follow-up visits. This is important. Where to find more information You can find more information about managing pain without opioids from: American Academy of Pain Medicine: painmed.org Institute for Chronic Pain: instituteforchronicpain.org American Chronic Pain Association: theacpa.org Contact a health care provider if: You have side effects from pain medicine. Your pain gets worse or does not get better with treatments or home therapy. You are struggling with anxiety or depression. Summary Many  types of pain can be managed without opioids. Chronic pain may respond better to pain management without opioids. Pain is best managed when you and a team of health care providers work together. Pain management without opioids may include non-opioid medicines, medical treatments, physical therapy, mental health therapy, and lifestyle changes. Tell  your health care providers if your pain gets worse or is not being managed well enough. This information is not intended to replace advice given to you by your health care provider. Make sure you discuss any questions you have with your health care provider. Document Revised: 11/23/2020 Document Reviewed: 11/23/2020 Elsevier Patient Education  2024 [employer].

## 2023-12-04 NOTE — Progress Notes (Addendum)
 Subjective:   Colleen White is a 78 y.o. who presents for a Medicare Wellness preventive visit.  Visit Complete: In person  Persons Participating in Visit: Patient.  AWV Questionnaire: No: Patient Medicare AWV questionnaire was not completed prior to this visit.  Cardiac Risk Factors include: advanced age (>17men, >13 women);hypertension;dyslipidemia     Objective:    Today's Vitals   12/04/23 0849  BP: 110/62  Pulse: 78  SpO2: 98%  Weight: 158 lb 6.4 oz (71.8 kg)  Height: 5' 3.5" (1.613 m)   Body mass index is 27.62 kg/m.     12/04/2023    8:47 AM 02/26/2023    4:28 PM 12/03/2022    9:11 AM 11/27/2021    8:15 AM 04/30/2021    1:51 PM 11/08/2020   10:44 AM 11/11/2019    6:06 PM  Advanced Directives  Does Patient Have a Medical Advance Directive? Yes No Yes Yes Yes Yes No  Type of Estate agent of [place];Living will  Healthcare Power of [place];Living will Healthcare Power of [place];Living will  Living will;Healthcare Power of Attorney   Does patient want to make changes to medical advance directive?      No - Patient declined   Copy of Healthcare Power of Attorney in Chart? No - copy requested  No - copy requested No - copy requested  No - copy requested   Would patient like information on creating a medical advance directive?       No - Patient declined    Current Medications (verified) Outpatient Encounter Medications as of 12/04/2023  Medication Sig   Ascorbic Acid (VITAMIN C PO) Take 500 mg by mouth daily.   brimonidine-timolol (COMBIGAN) 0.2-0.5 % ophthalmic solution Place 1 drop into both eyes every 12 (twelve) hours.    calcium citrate (CALCITRATE - DOSED IN MG ELEMENTAL CALCIUM) 950 (200 Ca) MG tablet Take 200 mg of elemental calcium by mouth 2 (two) times daily.   chlorpheniramine-HYDROcodone (TUSSIONEX) 10-8 MG/5ML Take 5 mLs by mouth every 12 (twelve) hours as needed for cough.   Cholecalciferol (VITAMIN D3) 1000 units CAPS Take 1,000  Units by mouth.    Coenzyme Q10 (CO Q 10) 100 MG CAPS Take 1 tablet by mouth daily.    dicyclomine (BENTYL) 10 MG capsule Take 10 mg by mouth as needed for spasms.   diltiazem (CARDIZEM CD) 120 MG 24 hr capsule TAKE ONE CAPSULE BY MOUTH DAILY   docusate sodium (COLACE) 100 MG capsule Take 200 mg by mouth as needed.   esomeprazole (NEXIUM) 20 MG packet Take 20 mg by mouth at bedtime. Takes as needed.   fluocinonide (LIDEX) 0.05 % external solution Apply 1 Application topically as directed. Itchy Scalp   gabapentin (NEURONTIN) 100 MG capsule Take 3 capsules (300 mg total) by mouth at bedtime.   loratadine (CLARITIN) 10 MG tablet Take by mouth daily as needed.    MINOXIDIL FOR MEN EX Apply topically. Takes as needed at night.   mirtazapine (REMERON) 15 MG tablet Take 7.5 mg by mouth as needed. Takes 1/4 tablet at bedtime for itching of scalp   Multiple Vitamins-Minerals (CENTRUM SILVER PO) Take 1 tablet by mouth daily.   Multiple Vitamins-Minerals (PRESERVISION AREDS 2 PO) Take 1 tablet by mouth 2 (two) times daily.    potassium chloride (MICRO-K) 10 MEQ CR capsule Take 1 capsule (10 mEq total) by mouth 2 (two) times daily.   pravastatin (PRAVACHOL) 40 MG tablet TAKE ONE TABLET BY MOUTH AT BEDTIME  predniSONE (DELTASONE) 10 MG tablet Take 10 mg by mouth daily at 6 (six) AM.   Probiotic Product (PROBIOTIC DAILY PO) Take 1 tablet by mouth daily.   rivaroxaban (XARELTO) 20 MG TABS tablet TAKE ONE TABLET BY MOUTH with SUPPER   telmisartan-hydrochlorothiazide (MICARDIS HCT) 80-12.5 MG tablet Take 1 tablet by mouth daily.   tiZANidine (ZANAFLEX) 4 MG tablet Take 1 tablet (4 mg total) by mouth every 8 (eight) hours as needed for muscle spasms.   traMADol (ULTRAM) 50 MG tablet TAKE ONE TABLET BY MOUTH EVERY 6 HOURS AS NEEDED   tretinoin (RETIN-A) 0.05 % cream Once daily   No facility-administered encounter medications on file as of 12/04/2023.    Allergies (verified) Ibuprofen   History: Past  Medical History:  Diagnosis Date   Allergic rhinitis, cause unspecified    Alopecia    Anxiety    Colon polyps    Diverticulosis of colon (without mention of hemorrhage)    Fibrocystic breast disease    GERD (gastroesophageal reflux disease)    Glaucoma    Headache(784.0)    Hemorrhoids    History of echocardiogram    Echo 3/17:  Vigorous LVF, EF 65-70%, normal wall motion, grade 1 diastolic, mildly elevated LVOT velocity of 2.2 m/s-likely related to vigorous LVF   History of stress test    Myoview normal in 2/05. //  Stress echo (11/15) with no evidence for ischemia or infarction.   HLD (hyperlipidemia)    HTN (hypertension)    Hypertension    Mitral valve disorders(424.0)    Non Hodgkin's lymphoma (HCC)    [facility] with surgery and XRT.  Did not have chemotherapy.    OSA (obstructive sleep apnea) 03/29/2016   Osteopenia    PAF (paroxysmal atrial fibrillation) (HCC)    a. CHADS2-VASc=3 //  b. Xarelto started in 2017   Pulmonary nodule    Presumed benign after observation.    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 Chauncey Mann, [facility]   s/p ORIF prox phalanx  02/2007   right little finger----Dr Merlyn Lot   Family History  Problem Relation Age of Onset   Hypertension Mother    Breast cancer Mother        double mastectomy   Heart disease Mother    Diabetes Father    Heart disease Father    Prostate cancer Father    Esophageal cancer Father    Breast cancer Sister        breast (sister), spread to brain   Colon cancer Maternal Uncle    Social History   Socioeconomic History   Marital status: Married    Spouse name: Not on file   Number of  children: 0   Years of education: Not on file   Highest education level: Not on file  Occupational History   Not on file  Tobacco Use   Smoking status: Former    Current packs/day: 0.00    Average packs/day: 1 pack/day for 12.0 years (12.0 ttl pk-yrs)    Types: Cigarettes    Start  date: 08/27/1962    Quit date: 08/27/1974    Years since quitting: 49.3    Passive exposure: Past   Smokeless tobacco: Never  Vaping Use   Vaping status: Never Used  Substance and Sexual Activity   Alcohol use: No   Drug use: No   Sexual activity: Yes  Other Topics Concern   Not on file  Social History Narrative   Lives at home with spouse   Retired   Caffeine: 3-4 cups/day   Social Drivers of Corporate investment banker Strain: Low Risk  (12/04/2023)   Overall Financial Resource Strain (CARDIA)    Difficulty of Paying Living Expenses: Not hard at all  Food Insecurity: No Food Insecurity (12/04/2023)   Hunger Vital Sign    Worried About Running Out of Food in the Last Year: Never true    Ran Out of Food in the Last Year: Never true  Transportation Needs: No Transportation Needs (12/04/2023)   PRAPARE - Administrator, Civil Service (Medical): No    Lack of Transportation (Non-Medical): No  Physical Activity: Insufficiently Active (12/04/2023)   Exercise Vital Sign    Days of Exercise per Week: 2 days    Minutes of Exercise per Session: 20 min  Stress: No Stress Concern Present (12/04/2023)   [employer] of Occupational Health - Occupational Stress Questionnaire    Feeling of Stress : Not at all  Social Connections: Socially Integrated (12/04/2023)   Social Connection and Isolation Panel [NHANES]    Frequency of Communication with Friends and Family: More than three times a week    Frequency of Social Gatherings with Friends and Family: More than three times a week    Attends Religious Services: More than 4 times per year    Active Member of [employer] or Organizations: Yes    Attends Probation officer: More than 4 times per year    Marital Status: Married    Tobacco Counseling Counseling given: No    Clinical Intake:  Pre-visit preparation completed: Yes  Pain : No/denies pain     BMI - recorded: 27.62 Nutritional Status: BMI 25 -29 Overweight Nutritional Risks: None Diabetes: No  Lab Results  Component Value Date   HGBA1C 5.8 09/10/2018   HGBA1C 5.7 06/25/2017   HGBA1C 5.9 12/24/2016     How often do you need to have someone help you when you read instructions, pamphlets, or other written materials from your doctor or pharmacy?: 1 - Never  Interpreter Needed?: No  Information entered by :: Hassell Halim, CMA   Activities of Daily Living     12/04/2023    8:54 AM  In your present state of health, do you have any difficulty performing the following activities:  Hearing? 0  Vision? 0  Difficulty concentrating or making decisions? 0  Walking or climbing stairs? 0  Dressing or bathing? 0  Doing errands, shopping? 0  Preparing Food and eating ? N  Using the Toilet? N  In the past six months, have you accidently leaked urine? N  Do you have problems with loss of bowel control? N  Managing your Medications? N  Managing your Finances? N  Housekeeping or managing your Housekeeping? N    Patient Care Team: Etta Grandchild, MD as PCP - General (Internal Medicine) Quintella Reichert, MD as PCP - Sleep Medicine (Sleep Medicine) Laurey Morale, MD as PCP - Cardiology (Cardiology) Maris Berger, MD as Consulting Physician (Ophthalmology) Erlene Quan Vinnie Level,  RPH (Inactive) as Pharmacist Scientist, research (physical sciences))  Indicate any recent Medical Services you may have received from other than [facility] providers in the past year (date may be approximate).     Assessment:   This is a routine wellness examination for [place].  Hearing/Vision screen Hearing Screening - Comments:: Denies hearing difficulties   Vision Screening - Comments:: Wears rx glasses - up  to date with routine eye exams with Dr Charlotte Sanes   Goals Addressed               This Visit's Progress     Patient Stated (pt-stated)        Patient stated that she plans to stay healthy as possible.       Depression Screen     12/04/2023    8:58 AM 12/03/2022    9:10 AM 08/16/2022    9:41 AM 03/05/2022    2:59 PM 11/27/2021    8:16 AM 11/27/2021    8:14 AM 03/06/2021   10:01 AM  PHQ 2/9 Scores  PHQ - 2 Score 0 0 0 0 0 0 0  PHQ- 9 Score 0 0  1   1    Fall Risk     12/04/2023    8:54 AM 10/02/2023    3:05 PM 12/03/2022    9:12 AM 08/16/2022    9:41 AM 03/05/2022    2:58 PM  Fall Risk   Falls in the past year? 1 1 0 1 1  Number falls in past yr: 0 0 0 0 1  Comment 1      Injury with Fall? 1 0 0 0 0  Comment hurts/bruised knees but no fractures      Risk for fall due to :   No Fall Risks No Fall Risks   Follow up Falls evaluation completed;Falls prevention discussed Falls evaluation completed Falls prevention discussed Falls evaluation completed     MEDICARE RISK AT HOME:  Medicare Risk at Home Any stairs in or around the home?: Yes If so, are there any without handrails?: No Home free of loose throw rugs in walkways, pet beds, electrical cords, etc?: Yes Adequate lighting in your home to reduce risk of falls?: Yes Life alert?: Yes Use of a cane, walker or w/c?: No Grab bars in the bathroom?: Yes Shower chair or bench in shower?: Yes Elevated toilet seat or a handicapped toilet?: Yes  TIMED UP AND GO:  Was the test performed?  No  Cognitive Function: 6CIT completed        12/04/2023    8:56 AM 12/03/2022    8:43 AM  6CIT Screen  What Year? 0 points 0 points  What month? 0 points 0 points  What time? 0 points 0 points  Count back from 20 0 points 0 points  Months in reverse 0 points 0 points  Repeat phrase 2 points 0 points  Total Score 2 points 0 points    Immunizations Immunization History  Administered Date(s) Administered   Fluad Quad(high Dose 65+)  04/20/2019, 08/01/2020, 07/12/2021, 08/16/2022   Fluad Trivalent(High Dose 65+) 10/02/2023   Influenza Split 09/05/2011, 07/30/2012   Influenza, High Dose Seasonal PF 10/11/2015, 09/11/2016, 06/25/2017, 06/19/2018   Influenza,inj,Quad PF,6+ Mos 08/26/2014   Influenza-Unspecified 06/27/2013   PFIZER Comirnaty(Gray Top)Covid-19 Tri-Sucrose Vaccine 09/02/2022   PFIZER(Purple Top)SARS-COV-2 Vaccination 09/12/2019, 10/03/2019, 06/13/2020, 01/03/2021   Pneumococcal Conjugate-13 12/15/2014   Pneumococcal Polysaccharide-23 12/21/2015, 02/13/2022   RSV,unspecified 09/09/2022   Tdap 09/30/2012   Zoster Recombinant(Shingrix) 09/03/2017, 11/04/2017  Zoster, Live 09/30/2009    Screening Tests Health Maintenance  Topic Date Due   DTaP/Tdap/Td (2 - Td or Tdap) 09/30/2022   COVID-19 Vaccine (6 - 2024-25 season) 04/28/2023   INFLUENZA VACCINE  03/27/2024   Medicare Annual Wellness (AWV)  12/03/2024   Pneumonia Vaccine 65+ Years old  Completed   DEXA SCAN  Completed   Hepatitis C Screening  Completed   Zoster Vaccines- Shingrix  Completed   HPV VACCINES  Aged Out   Colonoscopy  Discontinued    Health Maintenance  Health Maintenance Due  Topic Date Due   DTaP/Tdap/Td (2 - Td or Tdap) 09/30/2022   COVID-19 Vaccine (6 - 2024-25 season) 04/28/2023   Health Maintenance Items Addressed: 12/04/2023   Additional Screening:  Vision Screening: Recommended annual ophthalmology exams for early detection of glaucoma and other disorders of the eye.  Dental Screening: Recommended annual dental exams for proper oral hygiene  Community Resource Referral / Chronic Care Management: CRR required this visit?  No   CCM required this visit?  No     Plan:     I have personally reviewed and noted the following in the patient's chart:   Medical and social history Use of alcohol, tobacco or illicit drugs  Current medications and supplements including opioid prescriptions. Patient is currently taking  opioid prescriptions. Information provided to patient regarding non-opioid alternatives. Patient advised to discuss non-opioid treatment plan with their provider. Functional ability and status Nutritional status Physical activity Advanced directives List of other physicians Hospitalizations, surgeries, and ER visits in previous 12 months Vitals Screenings to include cognitive, depression, and falls Referrals and appointments  In addition, I have reviewed and discussed with patient certain preventive protocols, quality metrics, and best practice recommendations. A written personalized care plan for preventive services as well as general preventive health recommendations were provided to patient.     Darreld Mclean, CMA   12/04/2023   After Visit Summary: (MyChart) Due to this being a telephonic visit, the after visit summary with patients personalized plan was offered to patient via MyChart   Notes: Nothing significant to report at this time.

## 2023-12-23 DIAGNOSIS — L814 Other melanin hyperpigmentation: Secondary | ICD-10-CM | POA: Diagnosis not present

## 2023-12-23 DIAGNOSIS — Z1283 Encounter for screening for malignant neoplasm of skin: Secondary | ICD-10-CM | POA: Diagnosis not present

## 2023-12-23 DIAGNOSIS — D1801 Hemangioma of skin and subcutaneous tissue: Secondary | ICD-10-CM | POA: Diagnosis not present

## 2023-12-23 DIAGNOSIS — L821 Other seborrheic keratosis: Secondary | ICD-10-CM | POA: Diagnosis not present

## 2023-12-23 DIAGNOSIS — I83813 Varicose veins of bilateral lower extremities with pain: Secondary | ICD-10-CM | POA: Diagnosis not present

## 2023-12-23 DIAGNOSIS — D229 Melanocytic nevi, unspecified: Secondary | ICD-10-CM | POA: Diagnosis not present

## 2023-12-23 DIAGNOSIS — Z85828 Personal history of other malignant neoplasm of skin: Secondary | ICD-10-CM | POA: Diagnosis not present

## 2023-12-23 DIAGNOSIS — R208 Other disturbances of skin sensation: Secondary | ICD-10-CM | POA: Diagnosis not present

## 2023-12-23 DIAGNOSIS — L299 Pruritus, unspecified: Secondary | ICD-10-CM | POA: Diagnosis not present

## 2024-01-17 ENCOUNTER — Other Ambulatory Visit: Payer: Self-pay | Admitting: Internal Medicine

## 2024-01-17 DIAGNOSIS — M150 Primary generalized (osteo)arthritis: Secondary | ICD-10-CM

## 2024-01-24 ENCOUNTER — Other Ambulatory Visit: Payer: Self-pay

## 2024-01-24 ENCOUNTER — Ambulatory Visit (HOSPITAL_COMMUNITY): Admitting: Certified Registered Nurse Anesthetist

## 2024-01-24 ENCOUNTER — Ambulatory Visit (HOSPITAL_COMMUNITY)
Admission: RE | Admit: 2024-01-24 | Discharge: 2024-01-24 | Disposition: A | Discharge status: Home / Self Care | Attending: Orthopedic Surgery | Admitting: Orthopedic Surgery

## 2024-01-24 ENCOUNTER — Encounter (HOSPITAL_COMMUNITY): Payer: Self-pay | Admitting: Orthopedic Surgery

## 2024-01-24 ENCOUNTER — Ambulatory Visit (HOSPITAL_COMMUNITY)

## 2024-01-24 ENCOUNTER — Encounter (HOSPITAL_COMMUNITY)
Admission: RE | Disposition: A | Discharge status: Home / Self Care | Payer: Self-pay | Source: Home / Self Care | Attending: Orthopedic Surgery

## 2024-01-24 DIAGNOSIS — G4733 Obstructive sleep apnea (adult) (pediatric): Secondary | ICD-10-CM | POA: Insufficient documentation

## 2024-01-24 DIAGNOSIS — S52571A Other intraarticular fracture of lower end of right radius, initial encounter for closed fracture: Secondary | ICD-10-CM

## 2024-01-24 DIAGNOSIS — I10 Essential (primary) hypertension: Secondary | ICD-10-CM

## 2024-01-24 DIAGNOSIS — I480 Paroxysmal atrial fibrillation: Secondary | ICD-10-CM | POA: Insufficient documentation

## 2024-01-24 DIAGNOSIS — X58XXXA Exposure to other specified factors, initial encounter: Secondary | ICD-10-CM | POA: Insufficient documentation

## 2024-01-24 DIAGNOSIS — M25532 Pain in left wrist: Secondary | ICD-10-CM | POA: Diagnosis not present

## 2024-01-24 DIAGNOSIS — K219 Gastro-esophageal reflux disease without esophagitis: Secondary | ICD-10-CM | POA: Insufficient documentation

## 2024-01-24 DIAGNOSIS — Z7901 Long term (current) use of anticoagulants: Secondary | ICD-10-CM | POA: Diagnosis not present

## 2024-01-24 DIAGNOSIS — Z8572 Personal history of non-Hodgkin lymphomas: Secondary | ICD-10-CM | POA: Insufficient documentation

## 2024-01-24 DIAGNOSIS — S52509A Unspecified fracture of the lower end of unspecified radius, initial encounter for closed fracture: Secondary | ICD-10-CM | POA: Insufficient documentation

## 2024-01-24 DIAGNOSIS — M8580 Other specified disorders of bone density and structure, unspecified site: Secondary | ICD-10-CM | POA: Insufficient documentation

## 2024-01-24 DIAGNOSIS — Z87891 Personal history of nicotine dependence: Secondary | ICD-10-CM | POA: Diagnosis not present

## 2024-01-24 DIAGNOSIS — I341 Nonrheumatic mitral (valve) prolapse: Secondary | ICD-10-CM | POA: Diagnosis not present

## 2024-01-24 DIAGNOSIS — I4891 Unspecified atrial fibrillation: Secondary | ICD-10-CM | POA: Diagnosis not present

## 2024-01-24 DIAGNOSIS — M25531 Pain in right wrist: Secondary | ICD-10-CM | POA: Insufficient documentation

## 2024-01-24 HISTORY — PX: OPEN REDUCTION INTERNAL FIXATION (ORIF) DISTAL RADIAL FRACTURE: SHX5989

## 2024-01-24 LAB — BASIC METABOLIC PANEL WITH GFR
Anion gap: 13 (ref 5–15)
BUN: 12 mg/dL (ref 8–23)
CO2: 25 mmol/L (ref 22–32)
Calcium: 9.8 mg/dL (ref 8.9–10.3)
Chloride: 101 mmol/L (ref 98–111)
Creatinine, Ser: 0.82 mg/dL (ref 0.44–1.00)
GFR, Estimated: >60 mL/min (ref >60)
Glucose, Bld: 104 mg/dL — ABNORMAL HIGH (ref 70–99)
Potassium: 3.6 mmol/L (ref 3.5–5.1)
Sodium: 139 mmol/L (ref 135–145)

## 2024-01-24 LAB — CBC
HCT: 43.1 % (ref 36.0–46.0)
Hemoglobin: 14.6 g/dL (ref 12.0–15.0)
MCH: 31.9 pg (ref 26.0–34.0)
MCHC: 33.9 g/dL (ref 30.0–36.0)
MCV: 94.1 fL (ref 80.0–100.0)
Platelets: 217 10*3/uL (ref 150–400)
RBC: 4.58 MIL/uL (ref 3.87–5.11)
RDW: 12.3 % (ref 11.5–15.5)
WBC: 10.8 10*3/uL — ABNORMAL HIGH (ref 4.0–10.5)
nRBC: 0 % (ref 0.0–0.2)

## 2024-01-24 SURGERY — OPEN REDUCTION INTERNAL FIXATION (ORIF) DISTAL RADIUS FRACTURE
Anesthesia: Monitor Anesthesia Care | Laterality: Right

## 2024-01-24 MED ORDER — ONDANSETRON HCL 4 MG/2ML IJ SOLN
INTRAMUSCULAR | Status: DC | PRN
  Administered 2024-01-24: 4 mg via INTRAVENOUS

## 2024-01-24 MED ORDER — LACTATED RINGERS IV SOLN: INTRAVENOUS | Status: DC

## 2024-01-24 MED ORDER — PROPOFOL 500 MG/50ML IV EMUL
INTRAVENOUS | Status: DC | PRN
  Administered 2024-01-24: 100 ug/kg/min via INTRAVENOUS

## 2024-01-24 MED ORDER — ONDANSETRON HCL 4 MG/2ML IJ SOLN
4.0000 mg | Freq: Once | INTRAMUSCULAR | Status: DC | PRN
Start: 2024-01-24 — End: 2024-01-25

## 2024-01-24 MED ORDER — OXYCODONE HCL 5 MG/5ML PO SOLN: 5.0000 mg | Freq: Once | ORAL | Status: DC | PRN

## 2024-01-24 MED ORDER — MIDAZOLAM HCL 2 MG/2ML IJ SOLN
INTRAMUSCULAR | Status: AC
  Filled 2024-01-24: qty 2

## 2024-01-24 MED ORDER — PHENYLEPHRINE HCL-NACL 20-0.9 MG/250ML-% IV SOLN
INTRAVENOUS | Status: DC | PRN
  Administered 2024-01-24: 50 ug/min via INTRAVENOUS

## 2024-01-24 MED ORDER — FENTANYL CITRATE (PF) 100 MCG/2ML IJ SOLN
INTRAMUSCULAR | Status: AC
Start: 2024-01-24 — End: 2024-01-24
  Administered 2024-01-24: 50 ug via INTRAVENOUS
  Filled 2024-01-24: qty 2

## 2024-01-24 MED ORDER — CEFAZOLIN SODIUM-DEXTROSE 2-4 GM/100ML-% IV SOLN
2.0000 g | INTRAVENOUS | Status: AC
Start: 2024-01-24 — End: 2024-01-24
  Administered 2024-01-24: 2 g via INTRAVENOUS
  Filled 2024-01-24: qty 100

## 2024-01-24 MED ORDER — DEXAMETHASONE SODIUM PHOSPHATE 10 MG/ML IJ SOLN
INTRAMUSCULAR | Status: DC | PRN
Start: 2024-01-24 — End: 2024-01-24
  Administered 2024-01-24: 10 mg

## 2024-01-24 MED ORDER — 0.9 % SODIUM CHLORIDE (POUR BTL) OPTIME
TOPICAL | Status: DC | PRN
  Administered 2024-01-24: 1000 mL

## 2024-01-24 MED ORDER — FENTANYL CITRATE (PF) 100 MCG/2ML IJ SOLN: 25.0000 ug | INTRAMUSCULAR | Status: DC | PRN

## 2024-01-24 MED ORDER — CLONIDINE HCL (ANALGESIA) 100 MCG/ML EP SOLN
EPIDURAL | Status: DC | PRN
Start: 2024-01-24 — End: 2024-01-24
  Administered 2024-01-24: 100 ug

## 2024-01-24 MED ORDER — ORAL CARE MOUTH RINSE: 15.0000 mL | Freq: Once | OROMUCOSAL | Status: AC

## 2024-01-24 MED ORDER — ACETAMINOPHEN 10 MG/ML IV SOLN: 1000.0000 mg | Freq: Once | INTRAVENOUS | Status: DC | PRN

## 2024-01-24 MED ORDER — OXYCODONE HCL 5 MG PO TABS: 5.0000 mg | ORAL_TABLET | Freq: Once | ORAL | Status: DC | PRN

## 2024-01-24 MED ORDER — ROPIVACAINE HCL 5 MG/ML IJ SOLN
INTRAMUSCULAR | Status: DC | PRN
  Administered 2024-01-24: 30 mL via PERINEURAL

## 2024-01-24 MED ORDER — FENTANYL CITRATE (PF) 100 MCG/2ML IJ SOLN: 50.0000 ug | Freq: Once | INTRAMUSCULAR | Status: AC

## 2024-01-24 MED ORDER — CHLORHEXIDINE GLUCONATE 0.12 % MT SOLN
15.0000 mL | Freq: Once | OROMUCOSAL | Status: AC
  Administered 2024-01-24: 15 mL via OROMUCOSAL
  Filled 2024-01-24: qty 15

## 2024-01-24 SURGICAL SUPPLY — 47 items
BAG COUNTER SPONGE SURGICOUNT (BAG) ×2 IMPLANT
BIT DRILL 2.2 SS TIBIAL (BIT) IMPLANT
BNDG COMPR ESMARK 4X3 LF (GAUZE/BANDAGES/DRESSINGS) ×2 IMPLANT
BNDG ELASTIC 3INX 5YD STR LF (GAUZE/BANDAGES/DRESSINGS) ×2 IMPLANT
BNDG ELASTIC 4X5.8 VLCR STR LF (GAUZE/BANDAGES/DRESSINGS) ×2 IMPLANT
BNDG GAUZE DERMACEA FLUFF 4 (GAUZE/BANDAGES/DRESSINGS) ×2 IMPLANT
CANISTER SUCTION 3000ML PPV (SUCTIONS) ×2 IMPLANT
CORD BIPOLAR FORCEPS 12FT (ELECTRODE) ×2 IMPLANT
COVER SURGICAL LIGHT HANDLE (MISCELLANEOUS) ×2 IMPLANT
CUFF TOURN SGL QUICK 18X4 (TOURNIQUET CUFF) ×2 IMPLANT
CUFF TRNQT CYL 24X4X16.5-23 (TOURNIQUET CUFF) IMPLANT
DRAPE OEC MINIVIEW 54X84 (DRAPES) ×2 IMPLANT
DRAPE SURG 17X11 SM STRL (DRAPES) ×2 IMPLANT
DRSG ADAPTIC 3X8 NADH LF (GAUZE/BANDAGES/DRESSINGS) ×2 IMPLANT
GAUZE 4X4 16PLY ~~LOC~~+RFID DBL (SPONGE) ×2 IMPLANT
GAUZE SPONGE 4X4 12PLY STRL (GAUZE/BANDAGES/DRESSINGS) ×2 IMPLANT
GAUZE XEROFORM 5X9 LF (GAUZE/BANDAGES/DRESSINGS) ×2 IMPLANT
GLOVE BIOGEL PI IND STRL 8.5 (GLOVE) ×2 IMPLANT
GLOVE SURG ORTHO 8.0 STRL STRW (GLOVE) ×2 IMPLANT
GOWN STRL REUS W/ TWL LRG LVL3 (GOWN DISPOSABLE) ×2 IMPLANT
GOWN STRL REUS W/ TWL XL LVL3 (GOWN DISPOSABLE) ×2 IMPLANT
KIT BASIN OR (CUSTOM PROCEDURE TRAY) ×2 IMPLANT
KIT TURNOVER KIT B (KITS) ×2 IMPLANT
KWIRE FX5X1.6XNS BN SS (WIRE) IMPLANT
NDL HYPO 25X1 1.5 SAFETY (NEEDLE) ×2 IMPLANT
NEEDLE HYPO 25X1 1.5 SAFETY (NEEDLE) ×1 IMPLANT
NS IRRIG 1000ML POUR BTL (IV SOLUTION) ×2 IMPLANT
PACK ORTHO EXTREMITY (CUSTOM PROCEDURE TRAY) ×2 IMPLANT
PAD ARMBOARD POSITIONER FOAM (MISCELLANEOUS) ×4 IMPLANT
PAD CAST 4YDX4 CTTN HI CHSV (CAST SUPPLIES) ×2 IMPLANT
PEG LOCKING SMOOTH 2.2X18 (Peg) IMPLANT
PEG LOCKING SMOOTH 2.2X20 (Screw) IMPLANT
PEG LOCKING SMOOTH 2.2X22 (Screw) IMPLANT
PEG LOCKING SMOOTH 2.2X24 (Peg) IMPLANT
PLATE DVR CROSSLOCK STD RT (Plate) IMPLANT
SCREW LOCK 14X2.7X 3 LD TPR (Screw) IMPLANT
SCREW LOCK 18X2.7X 3 LD TPR (Screw) IMPLANT
SCREW LOCKING 2.7X15MM (Screw) IMPLANT
SOAP 2 % CHG 4 OZ (WOUND CARE) ×2 IMPLANT
SPIKE FLUID TRANSFER (MISCELLANEOUS) ×2 IMPLANT
SPLINT FIBERGLASS 3X35 (CAST SUPPLIES) IMPLANT
SPONGE T-LAP 4X18 ~~LOC~~+RFID (SPONGE) ×2 IMPLANT
TOWEL GREEN STERILE (TOWEL DISPOSABLE) ×2 IMPLANT
TOWEL GREEN STERILE FF (TOWEL DISPOSABLE) IMPLANT
TUBE CONNECTING 12X1/4 (SUCTIONS) ×2 IMPLANT
WATER STERILE IRR 1000ML POUR (IV SOLUTION) ×2 IMPLANT
YANKAUER SUCT BULB TIP NO VENT (SUCTIONS) IMPLANT

## 2024-01-24 NOTE — Op Note (Signed)
  PREOPERATIVE DIAGNOSIS: Right wrist intra-articular distal radius fracture 3 more fragments   POSTOPERATIVE DIAGNOSIS: Same   ATTENDING SURGEON: Dr. Regenia Cape who scrubbed and present for the entire procedure   ASSISTANT SURGEON: None   ANESTHESIA: Regional with IV sedation   OPERATIVE PROCEDURE: Open treatment of right wrist intra-articular distal radius fracture 3 more fragments requiring internal fixation Right wrist brachioradialis tendon release, tendon tenotomy Radiographs 3 views right wrist   IMPLANTS: DVR cross lock [employer]   EBL: Minimal   RADIOGRAPHIC INTERPRETATION: AP and lateral oblique views of the wrist do show the volar plate fixation placed in good position   SURGICAL INDICATIONS: Patient is a right-hand-dominant female who sustained a closed injury to the right wrist.  Patient was seen and evaluated the office and recommend undergo the above procedure.  Risks of surgery include but not limited to bleeding infection damage nearby nerves arteries or tendons loss of motion of the wrist and digits incomplete relief of symptoms and need for further surgical invention.  A signed informed consent was obtained on the day of surgery.   SURGICAL TECHNIQUE: The patient was prepped identified in the preoperative holding area marked apart a marker made on the right wrist indicate the correct operative site.  The patient then brought back the operating placed supine on the anesthesia table where the regional anesthetic was administered.  Patient tolerated well.  A well-padded tourniquet placed on the right brachium seal with the appropriate drape.  The right upper extremities then prepped and draped normal sterile fashion.  A timeout was called the correct site was identified procedure then began.  Attention was then turned to the right wrist the limb was then elevated.  Preoperative antibiotics were given prior to skin incision.  Tourniquet insufflated.  Dissection carried down through  the skin and subcutaneous tissue.  The FCR sheath was then opened proximally distally.  Following this going through the floor the FCR sheath deep dissection carried down to elevate the pronator quadratus.  Pronator quadratus was elevated in an L-shaped fashion.  Once this was carried out open reduction was then able to be begun.  In order to reduce the radial column separate intervention was undertaken.  The brachial radialis was carefully released along the radial column.  This exposed the intra-articular fragments of 3 more fragments.  Open reduction was then performed.  Tendon tenotomy was carried out.  The wound was then thoroughly irrigated.  The volar plate was then applied.  It was held distally with a K wire.  Plate confirmation was then confirmed.  Distal fixation was then carried out from an ulnar to radial direction of the distal locking pegs and screws. Following this final shaft fixation was then carried out with combination of locking nonlocking screws.  The wound was irrigated.  Final radiographs were then obtained.  Pronator quadratus was then closed with 2-0 Vicryl suture subcutaneous tissue was closed with Monocryl and skin closed with simple Prolene sutures.  Adaptic dressing sterile compressive bandage then applied.  The patient tolerated the procedure well.  Patient was placed in a well-padded volar splint taken to recovery room in good condition.   POSTOPERATIVE PLAN: Patient be discharged home.  See him back in the office in approxi-2 weeks for wound check suture removal x-rays application of short arm cast placed the therapy order to begin at the 4-week mark.  Radiographs at each visit.

## 2024-01-24 NOTE — Anesthesia Preprocedure Evaluation (Signed)
 Anesthesia Evaluation  Patient identified by MRN, date of birth, ID band Patient awake    Reviewed: Allergy & Precautions, NPO status , Patient's Chart, lab work & pertinent test results, reviewed documented beta blocker date and time   History of Anesthesia Complications Negative for: history of anesthetic complications  Airway Mallampati: II  TM Distance: >3 FB     Dental no notable dental hx.    Pulmonary sleep apnea , neg COPD, former smoker   breath sounds clear to auscultation       Cardiovascular hypertension, (-) angina (-) CAD and (-) Past MI + dysrhythmias Atrial Fibrillation (-) pacemaker Rhythm:Regular Rate:Normal   2020 echo  Study Conclusions   - Left ventricle: The cavity size was normal. Wall thickness was    normal. Systolic function was vigorous. The estimated ejection    fraction was in the range of 65% to 70%. Wall motion was normal;    there were no regional wall motion abnormalities. Doppler    parameters are consistent with abnormal left ventricular    relaxation (grade 1 diastolic dysfunction).   Impressions:   - Vigorous LV function; grade 1 diastolic dysfunction; mildly    elevated LVOT velocity of 2.2 m/s likely related to vigorous LV    function; trace MR and TR.     Neuro/Psych  Headaches, neg Seizures  Anxiety      Neuromuscular disease    GI/Hepatic ,GERD  ,,  Endo/Other    Renal/GU      Musculoskeletal  (+) Arthritis , Osteoarthritis,    Abdominal   Peds  Hematology  (+) Blood dyscrasia (on xarelto )   Anesthesia Other Findings   Reproductive/Obstetrics                              Anesthesia Physical Anesthesia Plan  ASA: 3  Anesthesia Plan: Regional and MAC   Post-op Pain Management: Regional block*   Induction: Intravenous  PONV Risk Score and Plan: 2 and Ondansetron and Propofol infusion  Airway Management Planned: Natural Airway and  Simple Face Mask  Additional Equipment:   Intra-op Plan:   Post-operative Plan:   Informed Consent: I have reviewed the patients History and Physical, chart, labs and discussed the procedure including the risks, benefits and alternatives for the proposed anesthesia with the patient or authorized representative who has indicated his/her understanding and acceptance.     Dental advisory given  Plan Discussed with: CRNA  Anesthesia Plan Comments:          Anesthesia Quick Evaluation

## 2024-01-24 NOTE — Transfer of Care (Signed)
 Immediate Anesthesia Transfer of Care Note  Patient: Colleen White  Procedure(s) Performed: OPEN REDUCTION INTERNAL FIXATION (ORIF) DISTAL RADIUS FRACTURE (Right)  Patient Location: PACU  Anesthesia Type:MAC and Regional  Level of Consciousness: awake, alert , and oriented  Airway & Oxygen Therapy: Patient Spontanous Breathing  Post-op Assessment: Report given to RN and Post -op Vital signs reviewed and stable  Post vital signs: Reviewed and stable  Last Vitals:  Vitals Value Taken Time  BP    Temp    Pulse 84 01/24/24 1837  Resp    SpO2 95 % 01/24/24 1837  Vitals shown include unfiled device data.  Last Pain:  Vitals:   01/24/24 1700  TempSrc:   PainSc: 5          Complications: No notable events documented.

## 2024-01-24 NOTE — H&P (Signed)
 Colleen White is an 78 y.o. female.   Chief Complaint: RIGHT WRIST INJURY HPI: PT SEEN TODAY  WALKED INTO OFFICE WITH RIGHT DISPLACED DISTAL RADIUS FRACTURE PT HERE FOR SURGERY TODAY  Past Medical History:  Diagnosis Date   Allergic rhinitis, cause unspecified    Alopecia    Anxiety    Colon polyps    Diverticulosis of colon (without mention of hemorrhage)    Fibrocystic breast disease    GERD (gastroesophageal reflux disease)    Glaucoma    Headache(784.0)    Hemorrhoids    History of echocardiogram    Echo 3/17:  Vigorous LVF, EF 65-70%, normal wall motion, grade 1 diastolic, mildly elevated LVOT velocity of 2.2 m/s-likely related to vigorous LVF   History of stress test    Myoview normal in 2/05. //  Stress echo (11/15) with no evidence for ischemia or infarction.   HLD (hyperlipidemia)    HTN (hypertension)    Hypertension    Mitral valve disorders(424.0)    Non Hodgkin's lymphoma (HCC)    Treated at [facility] with surgery and XRT.  Did not have chemotherapy.    OSA (obstructive sleep apnea) 03/29/2016   Osteopenia    PAF (paroxysmal atrial fibrillation) (HCC)    a. CHADS2-VASc=3 //  b. Xarelto  started in 2017   Pulmonary nodule    Presumed benign after observation.    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 Elray Hall, [facility]   s/p ORIF prox phalanx  02/2007   right little finger----Dr Huntley Mai    Family History  Problem Relation Age of Onset   Hypertension Mother    Breast cancer Mother        double mastectomy   Heart disease Mother    Diabetes Father    Heart disease Father    Prostate cancer Father    Esophageal cancer Father     Breast cancer Sister        breast (sister), spread to brain   Colon cancer Maternal Uncle    Social History:  reports that she quit smoking about 49 years ago. Her smoking use included cigarettes. She started smoking about 61 years ago. She has a 12 pack-year smoking history. She has been exposed to tobacco smoke. She has never used smokeless tobacco. She reports that she does not drink alcohol and does not use drugs.  Allergies:  Allergies  Allergen Reactions   Ibuprofen     Told not to take it by her MD    Medications Prior to Admission  Medication Sig Dispense Refill   Ascorbic Acid (VITAMIN C PO) Take 500 mg by mouth daily.     brimonidine-timolol (COMBIGAN) 0.2-0.5 % ophthalmic solution Place 1 drop into both eyes every 12 (twelve) hours.      calcium citrate (CALCITRATE - DOSED IN MG ELEMENTAL CALCIUM) 950 (200 Ca) MG tablet Take 200 mg of elemental calcium by mouth 2 (two) times daily.     chlorpheniramine-HYDROcodone  (TUSSIONEX) 10-8 MG/5ML Take 5 mLs by mouth every 12 (twelve) hours as needed for cough.  115 mL 0   Cholecalciferol  (VITAMIN D3) 1000 units CAPS Take 1,000 Units by mouth.      Coenzyme Q10 (CO Q 10) 100 MG CAPS Take 1 tablet by mouth daily.      diltiazem  (CARDIZEM  CD) 120 MG 24 hr capsule TAKE ONE CAPSULE BY MOUTH DAILY 90 capsule 3   docusate sodium (COLACE) 100 MG capsule Take 200 mg by mouth as needed.     esomeprazole  (NEXIUM ) 20 MG packet Take 20 mg by mouth at bedtime. Takes as needed.     fluocinonide (LIDEX) 0.05 % external solution Apply 1 Application topically as directed. Itchy Scalp     gabapentin  (NEURONTIN ) 100 MG capsule Take 3 capsules (300 mg total) by mouth at bedtime. 270 capsule 1   loratadine (CLARITIN) 10 MG tablet Take by mouth daily as needed.      MINOXIDIL FOR MEN EX Apply topically. Takes as needed at night.     Multiple Vitamins-Minerals (CENTRUM SILVER PO) Take 1 tablet by mouth daily.     Multiple Vitamins-Minerals (PRESERVISION AREDS  2 PO) Take 1 tablet by mouth 2 (two) times daily.      potassium chloride  (MICRO-K ) 10 MEQ CR capsule Take 1 capsule (10 mEq total) by mouth 2 (two) times daily. 180 capsule 0   pravastatin  (PRAVACHOL ) 40 MG tablet TAKE ONE TABLET BY MOUTH AT BEDTIME 90 tablet 1   Probiotic Product (PROBIOTIC DAILY PO) Take 1 tablet by mouth daily.     rivaroxaban  (XARELTO ) 20 MG TABS tablet TAKE ONE TABLET BY MOUTH with SUPPER 30 tablet 11   telmisartan -hydrochlorothiazide (MICARDIS  HCT) 80-12.5 MG tablet Take 1 tablet by mouth daily. 90 tablet 1   traMADol  (ULTRAM ) 50 MG tablet TAKE ONE TABLET BY MOUTH EVERY 6 HOURS AS NEEDED 65 tablet 2   tretinoin (RETIN-A) 0.05 % cream Once daily     dicyclomine  (BENTYL ) 10 MG capsule Take 10 mg by mouth as needed for spasms.     mirtazapine  (REMERON ) 15 MG tablet Take 7.5 mg by mouth as needed. Takes 1/4 tablet at bedtime for itching of scalp     predniSONE  (DELTASONE ) 10 MG tablet Take 10 mg by mouth daily at 6 (six) AM.     tiZANidine  (ZANAFLEX ) 4 MG tablet Take 1 tablet (4 mg total) by mouth every 8 (eight) hours as needed for muscle spasms. 90 tablet 3    No results found for this or any previous visit (from the past 48 hours). No results found.  ROS NO RECENT ILLNESSES OR HOSPITALIZATIONS  Blood pressure 137/62, pulse (!) 46, temperature 97.9 F (36.6 C), temperature source Oral, resp. rate 18, height 5\' 3"  (1.6 m), weight 71.7 kg, SpO2 99%. Physical Exam  General Appearance:  Alert, cooperative, no distress, appears stated age  Head:  Normocephalic, without obvious abnormality, atraumatic  Eyes:  Pupils equal, conjunctiva/corneas clear,         Throat: Lips, mucosa, and tongue normal; teeth and gums normal  Neck: No visible masses     Lungs:   respirations unlabored  Chest Wall:  No tenderness or deformity  Heart:  Regular rate and rhythm,  Abdomen:   Soft, non-tender,         Extremities: RUE: SKIN INTACT, FINGERS WARM WELL PERFUSED ABLE TO EXTEND  THUMB AND FINGERS  Pulses: 2+ and symmetric  Skin: Skin color, texture, turgor normal, no rashes or lesions     Neurologic: Normal     Assessment/Plan RIGHT DISPLACED DISTAL RADIUS FRACTURE  RIGHT WRIST OPEN REDUCTION AND INTERNAL FIXATION AND REPAIR AS INDICATED  R/B/A DISCUSSED WITH PT IN OFFICE.  PT VOICED UNDERSTANDING OF PLAN CONSENT SIGNED DAY OF SURGERY PT SEEN AND EXAMINED PRIOR TO OPERATIVE PROCEDURE/DAY OF SURGERY SITE MARKED. QUESTIONS ANSWERED WILL GO HOME FOLLOWING SURGERY   WE ARE PLANNING SURGERY FOR YOUR UPPER EXTREMITY. THE RISKS AND BENEFITS OF SURGERY INCLUDE BUT NOT LIMITED TO BLEEDING INFECTION, DAMAGE TO NEARBY NERVES ARTERIES TENDONS, FAILURE OF SURGERY TO ACCOMPLISH ITS INTENDED GOALS, PERSISTENT SYMPTOMS AND NEED FOR FURTHER SURGICAL INTERVENTION. WITH THIS IN MIND WE WILL PROCEED. I HAVE DISCUSSED WITH THE PATIENT THE PRE AND POSTOPERATIVE REGIMEN AND THE DOS AND DON'TS. PT VOICED UNDERSTANDING AND INFORMED CONSENT SIGNED.   Shellie Dials 01/24/2024, 3:58 PM

## 2024-01-24 NOTE — Anesthesia Procedure Notes (Signed)
 Anesthesia Regional Block: Axillary brachial plexus block   Pre-Anesthetic Checklist: , timeout performed,  Correct Patient, Correct Site, Correct Laterality,  Correct Procedure, Correct Position, site marked,  Risks and benefits discussed,  Surgical consent,  Pre-op evaluation,  At surgeon's request and post-op pain management  Laterality: Right  Prep: Maximum Sterile Barrier Precautions used, chloraprep       Needles:  Injection technique: Single-shot  Needle Type: Echogenic Needle      Needle Gauge: 20     Additional Needles:   Procedures:,,,, ultrasound used (permanent image in chart),,    Narrative:  Start time: 01/24/2024 4:58 PM End time: 01/24/2024 5:03 PM Injection made incrementally with aspirations every 5 mL.  Performed by: Personally  Anesthesiologist: Leslye Rast, MD

## 2024-01-24 NOTE — Anesthesia Postprocedure Evaluation (Signed)
 Anesthesia Post Note  Patient: Colleen White  Procedure(s) Performed: OPEN REDUCTION INTERNAL FIXATION (ORIF) DISTAL RADIUS FRACTURE (Right)     Patient location during evaluation: PACU Anesthesia Type: Regional Level of consciousness: awake and alert Pain management: pain level controlled Vital Signs Assessment: post-procedure vital signs reviewed and stable Respiratory status: spontaneous breathing, nonlabored ventilation, respiratory function stable and patient connected to nasal cannula oxygen Cardiovascular status: stable and blood pressure returned to baseline Postop Assessment: no apparent nausea or vomiting Anesthetic complications: no  No notable events documented.  Last Vitals:  Vitals:   01/24/24 1845 01/24/24 1855  BP: (!) 145/90 (!) 172/92  Pulse: 84 79  Resp: (!) 23 14  Temp:    SpO2: 94% 98%    Last Pain:  Vitals:   01/24/24 1700  TempSrc:   PainSc: 5                  Willian Harrow

## 2024-01-24 NOTE — Discharge Instructions (Addendum)
 KEEP BANDAGE CLEAN AND DRY CALL OFFICE FOR F/U APPT [phone] in 2 weeks RX already sent and received by patient at pharmacy today KEEP HAND ELEVATED ABOVE HEART OK TO APPLY ICE TO OPERATIVE AREA CONTACT OFFICE IF ANY WORSENING PAIN OR CONCERNS.

## 2024-01-27 ENCOUNTER — Encounter (HOSPITAL_COMMUNITY): Payer: Self-pay | Admitting: Orthopedic Surgery

## 2024-02-07 DIAGNOSIS — S52571D Other intraarticular fracture of lower end of right radius, subsequent encounter for closed fracture with routine healing: Secondary | ICD-10-CM | POA: Diagnosis not present

## 2024-02-11 ENCOUNTER — Other Ambulatory Visit (HOSPITAL_COMMUNITY): Payer: Self-pay | Admitting: Cardiology

## 2024-02-17 ENCOUNTER — Ambulatory Visit (INDEPENDENT_AMBULATORY_CARE_PROVIDER_SITE_OTHER): Payer: Medicare Other | Admitting: Internal Medicine

## 2024-02-17 ENCOUNTER — Encounter: Payer: Self-pay | Admitting: Internal Medicine

## 2024-02-17 ENCOUNTER — Ambulatory Visit: Payer: Self-pay | Admitting: Internal Medicine

## 2024-02-17 VITALS — BP 138/86 | HR 63 | Temp 98.2°F | Resp 16 | Ht 63.0 in | Wt 158.5 lb

## 2024-02-17 DIAGNOSIS — L280 Lichen simplex chronicus: Secondary | ICD-10-CM | POA: Diagnosis not present

## 2024-02-17 DIAGNOSIS — K7581 Nonalcoholic steatohepatitis (NASH): Secondary | ICD-10-CM

## 2024-02-17 DIAGNOSIS — E559 Vitamin D deficiency, unspecified: Secondary | ICD-10-CM | POA: Diagnosis not present

## 2024-02-17 DIAGNOSIS — E538 Deficiency of other specified B group vitamins: Secondary | ICD-10-CM | POA: Diagnosis not present

## 2024-02-17 DIAGNOSIS — I10 Essential (primary) hypertension: Secondary | ICD-10-CM

## 2024-02-17 DIAGNOSIS — E7800 Pure hypercholesterolemia, unspecified: Secondary | ICD-10-CM

## 2024-02-17 LAB — BASIC METABOLIC PANEL WITH GFR
BUN: 14 mg/dL (ref 6–23)
CO2: 34 meq/L — ABNORMAL HIGH (ref 19–32)
Calcium: 9.8 mg/dL (ref 8.4–10.5)
Chloride: 100 meq/L (ref 96–112)
Creatinine, Ser: 0.77 mg/dL (ref 0.40–1.20)
GFR: 73.42 mL/min (ref >60.00)
Glucose, Bld: 97 mg/dL (ref 70–99)
Potassium: 3.7 meq/L (ref 3.5–5.1)
Sodium: 140 meq/L (ref 135–145)

## 2024-02-17 LAB — IBC + FERRITIN
Ferritin: 24.1 ng/mL (ref 10.0–291.0)
Iron: 136 ug/dL (ref 42–145)
Saturation Ratios: 37.7 % (ref 20.0–50.0)
TIBC: 361.2 ug/dL (ref 250.0–450.0)
Transferrin: 258 mg/dL (ref 212.0–360.0)

## 2024-02-17 LAB — CBC WITH DIFFERENTIAL/PLATELET
Basophils Absolute: 0 10*3/uL (ref 0.0–0.1)
Basophils Relative: 0.4 % (ref 0.0–3.0)
Eosinophils Absolute: 0 10*3/uL (ref 0.0–0.7)
Eosinophils Relative: 0.7 % (ref 0.0–5.0)
HCT: 40.1 % (ref 36.0–46.0)
Hemoglobin: 13.5 g/dL (ref 12.0–15.0)
Lymphocytes Relative: 24.1 % (ref 12.0–46.0)
Lymphs Abs: 1.2 10*3/uL (ref 0.7–4.0)
MCHC: 33.7 g/dL (ref 30.0–36.0)
MCV: 93.4 fl (ref 78.0–100.0)
Monocytes Absolute: 0.4 10*3/uL (ref 0.1–1.0)
Monocytes Relative: 7.6 % (ref 3.0–12.0)
Neutro Abs: 3.3 10*3/uL (ref 1.4–7.7)
Neutrophils Relative %: 67.2 % (ref 43.0–77.0)
Platelets: 214 10*3/uL (ref 150.0–400.0)
RBC: 4.3 Mil/uL (ref 3.87–5.11)
RDW: 13.3 % (ref 11.5–15.5)
WBC: 5 10*3/uL (ref 4.0–10.5)

## 2024-02-17 LAB — LIPID PANEL
Cholesterol: 129 mg/dL (ref 0–200)
HDL: 47.4 mg/dL (ref >39.00)
LDL Cholesterol: 62 mg/dL (ref 0–99)
NonHDL: 81.47
Total CHOL/HDL Ratio: 3
Triglycerides: 99 mg/dL (ref 0.0–149.0)
VLDL: 19.8 mg/dL (ref 0.0–40.0)

## 2024-02-17 LAB — HEPATIC FUNCTION PANEL
ALT: 17 U/L (ref 0–35)
AST: 21 U/L (ref 0–37)
Albumin: 4.4 g/dL (ref 3.5–5.2)
Alkaline Phosphatase: 97 U/L (ref 39–117)
Bilirubin, Direct: 0.1 mg/dL (ref 0.0–0.3)
Total Bilirubin: 0.7 mg/dL (ref 0.2–1.2)
Total Protein: 7.3 g/dL (ref 6.0–8.3)

## 2024-02-17 LAB — TSH: TSH: 1.78 u[IU]/mL (ref 0.35–5.50)

## 2024-02-17 LAB — FOLATE: Folate: >23.2 ng/mL (ref >5.9)

## 2024-02-17 LAB — VITAMIN B12: Vitamin B-12: 575 pg/mL (ref 211–911)

## 2024-02-17 LAB — MAGNESIUM: Magnesium: 2 mg/dL (ref 1.5–2.5)

## 2024-02-17 LAB — VITAMIN D 25 HYDROXY (VIT D DEFICIENCY, FRACTURES): VITD: 45.76 ng/mL (ref 30.00–100.00)

## 2024-02-17 MED ORDER — TELMISARTAN 80 MG PO TABS: 80.0000 mg | ORAL_TABLET | Freq: Every day | ORAL | 0 refills | Status: DC

## 2024-02-17 NOTE — Patient Instructions (Signed)
 Hypertension, Adult High blood pressure (hypertension) is when the force of blood pumping through the arteries is too strong. The arteries are the blood vessels that carry blood from the heart throughout the body. Hypertension forces the heart to work harder to pump blood and may cause arteries to become narrow or stiff. Untreated or uncontrolled hypertension can lead to a heart attack, heart failure, a stroke, kidney disease, and other problems. A blood pressure reading consists of a higher number over a lower number. Ideally, your blood pressure should be below 120/80. The first ("top") number is called the systolic pressure. It is a measure of the pressure in your arteries as your heart beats. The second ("bottom") number is called the diastolic pressure. It is a measure of the pressure in your arteries as the heart relaxes. What are the causes? The exact cause of this condition is not known. There are some conditions that result in high blood pressure. What increases the risk? Certain factors may make you more likely to develop high blood pressure. Some of these risk factors are under your control, including: Smoking. Not getting enough exercise or physical activity. Being overweight. Having too much fat, sugar, calories, or salt (sodium) in your diet. Drinking too much alcohol. Other risk factors include: Having a personal history of heart disease, diabetes, high cholesterol, or kidney disease. Stress. Having a family history of high blood pressure and high cholesterol. Having obstructive sleep apnea. Age. The risk increases with age. What are the signs or symptoms? High blood pressure may not cause symptoms. Very high blood pressure (hypertensive crisis) may cause: Headache. Fast or irregular heartbeats (palpitations). Shortness of breath. Nosebleed. Nausea and vomiting. Vision changes. Severe chest pain, dizziness, and seizures. How is this diagnosed? This condition is diagnosed by  measuring your blood pressure while you are seated, with your arm resting on a flat surface, your legs uncrossed, and your feet flat on the floor. The cuff of the blood pressure monitor will be placed directly against the skin of your upper arm at the level of your heart. Blood pressure should be measured at least twice using the same arm. Certain conditions can cause a difference in blood pressure between your right and left arms. If you have a high blood pressure reading during one visit or you have normal blood pressure with other risk factors, you may be asked to: Return on a different day to have your blood pressure checked again. Monitor your blood pressure at home for 1 week or longer. If you are diagnosed with hypertension, you may have other blood or imaging tests to help your health care provider understand your overall risk for other conditions. How is this treated? This condition is treated by making healthy lifestyle changes, such as eating healthy foods, exercising more, and reducing your alcohol intake. You may be referred for counseling on a healthy diet and physical activity. Your health care provider may prescribe medicine if lifestyle changes are not enough to get your blood pressure under control and if: Your systolic blood pressure is above 130. Your diastolic blood pressure is above 80. Your personal target blood pressure may vary depending on your medical conditions, your age, and other factors. Follow these instructions at home: Eating and drinking  Eat a diet that is high in fiber and potassium, and low in sodium, added sugar, and fat. An example of this eating plan is called the DASH diet. DASH stands for Dietary Approaches to Stop Hypertension. To eat this way: Eat  plenty of fresh fruits and vegetables. Try to fill one half of your plate at each meal with fruits and vegetables. Eat whole grains, such as whole-wheat pasta, brown rice, or whole-grain bread. Fill about one  fourth of your plate with whole grains. Eat or drink low-fat dairy products, such as skim milk or low-fat yogurt. Avoid fatty cuts of meat, processed or cured meats, and poultry with skin. Fill about one fourth of your plate with lean proteins, such as fish, chicken without skin, beans, eggs, or tofu. Avoid pre-made and processed foods. These tend to be higher in sodium, added sugar, and fat. Reduce your daily sodium intake. Many people with hypertension should eat less than 1,500 mg of sodium a day. Do not drink alcohol if: Your health care provider tells you not to drink. You are pregnant, may be pregnant, or are planning to become pregnant. If you drink alcohol: Limit how much you have to: 0-1 drink a day for women. 0-2 drinks a day for men. Know how much alcohol is in your drink. In the [place] one drink equals one 12 oz bottle of beer (355 mL), one 5 oz glass of wine (148 mL), or one 1 oz glass of hard liquor (44 mL). Lifestyle  Work with your health care provider to maintain a healthy body weight or to lose weight. Ask what an ideal weight is for you. Get at least 30 minutes of exercise that causes your heart to beat faster (aerobic exercise) most days of the week. Activities may include walking, swimming, or biking. Include exercise to strengthen your muscles (resistance exercise), such as Pilates or lifting weights, as part of your weekly exercise routine. Try to do these types of exercises for 30 minutes at least 3 days a week. Do not use any products that contain nicotine or tobacco. These products include cigarettes, chewing tobacco, and vaping devices, such as e-cigarettes. If you need help quitting, ask your health care provider. Monitor your blood pressure at home as told by your health care provider. Keep all follow-up visits. This is important. Medicines Take over-the-counter and prescription medicines only as told by your health care provider. Follow directions carefully. Blood  pressure medicines must be taken as prescribed. Do not skip doses of blood pressure medicine. Doing this puts you at risk for problems and can make the medicine less effective. Ask your health care provider about side effects or reactions to medicines that you should watch for. Contact a health care provider if you: Think you are having a reaction to a medicine you are taking. Have headaches that keep coming back (recurring). Feel dizzy. Have swelling in your ankles. Have trouble with your vision. Get help right away if you: Develop a severe headache or confusion. Have unusual weakness or numbness. Feel faint. Have severe pain in your chest or abdomen. Vomit repeatedly. Have trouble breathing. These symptoms may be an emergency. Get help right away. Call 911. Do not wait to see if the symptoms will go away. Do not drive yourself to the hospital. Summary Hypertension is when the force of blood pumping through your arteries is too strong. If this condition is not controlled, it may put you at risk for serious complications. Your personal target blood pressure may vary depending on your medical conditions, your age, and other factors. For most people, a normal blood pressure is less than 120/80. Hypertension is treated with lifestyle changes, medicines, or a combination of both. Lifestyle changes include losing weight, eating a healthy,  low-sodium diet, exercising more, and limiting alcohol. This information is not intended to replace advice given to you by your health care provider. Make sure you discuss any questions you have with your health care provider. Document Revised: 06/20/2021 Document Reviewed: 06/20/2021 Elsevier Patient Education  2024 [employer].

## 2024-02-17 NOTE — Progress Notes (Unsigned)
 Subjective:  Patient ID: Colleen White, female    DOB: 03/11/1946  Age: 78 y.o. MRN: [id number]  CC: Hypertension and Hyperlipidemia   HPI Colleen White presents for f/up  ----  Discussed the use of AI scribe software for clinical note transcription with the patient, who gave verbal consent to proceed.  History of Present Illness   [name] is a 78 year old female who presents with a right wrist fracture following a fall.  She lost her balance while putting on her shoes, resulting in a fall and a fracture of her right wrist. Surgery was performed on the same day, approximately four weeks ago, with significant pain relief achieved through a nerve block.  Post-surgery, she has been taking generic Percocet for pain management for about two weeks, which has been effective. She also takes Xarelto  and Tramadol . No dizziness or lightheadedness is reported. Her blood pressure has been occasionally low but typically returns to normal after a day or two. She took her blood pressure medication around 7 AM on the day of the visit, with the highest reading in the past month being 130 mmHg systolic.  She has not been taking any medication for cough recently, although she has a prescription for Tussionex from six months ago. No current symptoms of nausea, vomiting, constipation, bloating, or pain, and no swelling in her legs or feet are reported.  She experiences difficulty using her left hand for daily activities such as eating and grooming due to the right wrist fracture and is learning to adapt. Bruising and swelling in her fingers are managed by keeping her hand elevated.  She has experienced some weight loss, estimating about four pounds, but attributes this to wearing shoes during the weighing process. She denies any warning signs prior to her fall, such as dizziness or imbalance, and attributes the fall to moving too quickly while in a hurry.       Outpatient Medications Prior  to Visit  Medication Sig Dispense Refill   Ascorbic Acid (VITAMIN C PO) Take 500 mg by mouth daily.     brimonidine-timolol (COMBIGAN) 0.2-0.5 % ophthalmic solution Place 1 drop into both eyes every 12 (twelve) hours.      calcium citrate (CALCITRATE - DOSED IN MG ELEMENTAL CALCIUM) 950 (200 Ca) MG tablet Take 200 mg of elemental calcium by mouth 2 (two) times daily.     chlorpheniramine-HYDROcodone  (TUSSIONEX) 10-8 MG/5ML Take 5 mLs by mouth every 12 (twelve) hours as needed for cough. 115 mL 0   Cholecalciferol  (VITAMIN D3) 1000 units CAPS Take 1,000 Units by mouth.      Coenzyme Q10 (CO Q 10) 100 MG CAPS Take 1 tablet by mouth daily.      dicyclomine  (BENTYL ) 10 MG capsule Take 10 mg by mouth as needed for spasms.     diltiazem  (CARDIZEM  CD) 120 MG 24 hr capsule TAKE ONE CAPSULE BY MOUTH DAILY 90 capsule 3   docusate sodium (COLACE) 100 MG capsule Take 200 mg by mouth as needed.     esomeprazole  (NEXIUM ) 20 MG packet Take 20 mg by mouth at bedtime. Takes as needed.     fluocinonide (LIDEX) 0.05 % external solution Apply 1 Application topically as directed. Itchy Scalp     gabapentin  (NEURONTIN ) 100 MG capsule Take 3 capsules (300 mg total) by mouth at bedtime. 270 capsule 1   loratadine (CLARITIN) 10 MG tablet Take by mouth daily as needed.      MINOXIDIL FOR MEN  EX Apply topically. Takes as needed at night.     mirtazapine  (REMERON ) 15 MG tablet Take 7.5 mg by mouth as needed. Takes 1/4 tablet at bedtime for itching of scalp     Multiple Vitamins-Minerals (CENTRUM SILVER PO) Take 1 tablet by mouth daily.     Multiple Vitamins-Minerals (PRESERVISION AREDS 2 PO) Take 1 tablet by mouth 2 (two) times daily.      potassium chloride  (MICRO-K ) 10 MEQ CR capsule Take 1 capsule (10 mEq total) by mouth 2 (two) times daily. 180 capsule 0   pravastatin  (PRAVACHOL ) 40 MG tablet TAKE ONE TABLET BY MOUTH AT BEDTIME 90 tablet 1   Probiotic Product (PROBIOTIC DAILY PO) Take 1 tablet by mouth daily.      rivaroxaban  (XARELTO ) 20 MG TABS tablet TAKE ONE TABLET BY MOUTH with SUPPER 30 tablet 1   traMADol  (ULTRAM ) 50 MG tablet TAKE ONE TABLET BY MOUTH EVERY 6 HOURS AS NEEDED 65 tablet 2   tretinoin (RETIN-A) 0.05 % cream Once daily     oxyCODONE -acetaminophen  (PERCOCET/ROXICET) 5-325 MG tablet Take 1-2 tablets by mouth every 4 (four) hours as needed.     telmisartan -hydrochlorothiazide (MICARDIS  HCT) 80-12.5 MG tablet Take 1 tablet by mouth daily. 90 tablet 1   cyanocobalamin  50 MCG tablet Take 50 mcg by mouth daily.     predniSONE  (DELTASONE ) 10 MG tablet Take 10 mg by mouth daily at 6 (six) AM.     tiZANidine  (ZANAFLEX ) 4 MG tablet Take 1 tablet (4 mg total) by mouth every 8 (eight) hours as needed for muscle spasms. 90 tablet 3   No facility-administered medications prior to visit.    ROS Review of Systems  Constitutional: Negative.  Negative for appetite change, chills, diaphoresis and fatigue.  HENT: Negative.    Eyes: Negative.   Respiratory:  Positive for cough. Negative for chest tightness, shortness of breath and wheezing.   Cardiovascular:  Negative for chest pain, palpitations and leg swelling.  Gastrointestinal:  Negative for abdominal pain, constipation, diarrhea, nausea and vomiting.  Genitourinary: Negative.  Negative for difficulty urinating.  Musculoskeletal: Negative.  Negative for arthralgias and myalgias.  Skin:  Negative for color change, pallor and rash.  Neurological:  Negative for dizziness and weakness.  Hematological:  Negative for adenopathy. Does not bruise/bleed easily.  Psychiatric/Behavioral: Negative.      Objective:  BP 138/86 (BP Location: Left Arm, Patient Position: Sitting, Cuff Size: Normal)   Pulse 63   Temp 98.2 F (36.8 C) (Oral)   Resp 16   Ht 5' 3 (1.6 m)   Wt 158 lb 8 oz (71.9 kg)   SpO2 98%   BMI 28.08 kg/m   BP Readings from Last 3 Encounters:  02/17/24 138/86  01/24/24 (!) 172/92  12/04/23 110/62    Wt Readings from Last 3  Encounters:  02/17/24 158 lb 8 oz (71.9 kg)  01/24/24 158 lb (71.7 kg)  12/04/23 158 lb 6.4 oz (71.8 kg)    Physical Exam Vitals reviewed.  HENT:     Nose: Nose normal.     Mouth/Throat:     Mouth: Mucous membranes are moist.   Eyes:     General: No scleral icterus.    Conjunctiva/sclera: Conjunctivae normal.    Cardiovascular:     Rate and Rhythm: Normal rate.     Heart sounds: No murmur heard.    No friction rub. No gallop.  Pulmonary:     Effort: Pulmonary effort is normal.     Breath sounds:  No stridor. No wheezing, rhonchi or rales.  Abdominal:     General: Abdomen is flat.     Palpations: There is no mass.     Tenderness: There is no abdominal tenderness. There is no guarding.     Hernia: No hernia is present.   Musculoskeletal:        General: Normal range of motion.     Cervical back: Neck supple.     Right lower leg: No edema.     Left lower leg: No edema.  Lymphadenopathy:     Cervical: No cervical adenopathy.   Skin:    General: Skin is warm and dry.   Neurological:     General: No focal deficit present.     Mental Status: She is alert. Mental status is at baseline.   Psychiatric:        Mood and Affect: Mood normal.        Behavior: Behavior normal.     Lab Results  Component Value Date   WBC 5.0 02/17/2024   HGB 13.5 02/17/2024   HCT 40.1 02/17/2024   PLT 214.0 02/17/2024   GLUCOSE 97 02/17/2024   CHOL 129 02/17/2024   TRIG 99.0 02/17/2024   HDL 47.40 02/17/2024   LDLDIRECT 142.5 09/30/2012   LDLCALC 62 02/17/2024   ALT 17 02/17/2024   AST 21 02/17/2024   NA 140 02/17/2024   K 3.7 02/17/2024   CL 100 02/17/2024   CREATININE 0.77 02/17/2024   BUN 14 02/17/2024   CO2 34 (H) 02/17/2024   TSH 1.78 02/17/2024   INR 1.7 (H) 03/06/2021   HGBA1C 5.8 09/10/2018    DG MINI C-ARM IMAGE ONLY Result Date: 01/24/2024 There is no interpretation for this exam.  This order is for images obtained during a surgical procedure.  Please See  Surgeries Tab for more information regarding the procedure.   HYBRID OR IMAGING ([facility] ONLY) Result Date: 01/24/2024 There is no interpretation for this exam.  This order is for images obtained during a surgical procedure.  Please See Surgeries Tab for more information regarding the procedure.    Assessment & Plan:  Vitamin D  deficiency disease -     VITAMIN D  25 Hydroxy (Vit-D Deficiency, Fractures); Future  B12 deficiency due to diet -     Vitamin B12; Future -     CBC with Differential/Platelet; Future  HYPERCHOLESTEROLEMIA -     Lipid panel; Future  Essential hypertension- BP is well controlled but CO2 is elevated. Will discontinue hydrochlorothiazide and increase the ARB dose. -     Basic metabolic panel with GFR; Future -     TSH; Future -     Hepatic function panel; Future -     CBC with Differential/Platelet; Future -     Magnesium ; Future -     Telmisartan ; Take 1 tablet (80 mg total) by mouth daily.  Dispense: 90 tablet; Refill: 0  NASH (nonalcoholic steatohepatitis) -     Hepatic function panel; Future -     Magnesium ; Future  Neurodermatitis -     IBC + Ferritin; Future -     Vitamin B12; Future -     Basic metabolic panel with GFR; Future -     TSH; Future -     Vitamin B1; Future -     Vitamin B6; Future -     Zinc ; Future -     Hepatic function panel; Future -     Folate; Future -  CBC with Differential/Platelet; Future -     Magnesium ; Future     Follow-up: Return in about 3 months (around 05/19/2024).  Debby Molt, MD

## 2024-02-20 LAB — VITAMIN B1: Vitamin B1 (Thiamine): 41 nmol/L — ABNORMAL HIGH (ref 8–30)

## 2024-02-20 LAB — VITAMIN B6: Vitamin B6: 30.6 ng/mL — ABNORMAL HIGH (ref 2.1–21.7)

## 2024-02-20 LAB — ZINC: Zinc: 194 ug/dL — ABNORMAL HIGH (ref 60–130)

## 2024-02-21 DIAGNOSIS — S52571D Other intraarticular fracture of lower end of right radius, subsequent encounter for closed fracture with routine healing: Secondary | ICD-10-CM | POA: Diagnosis not present

## 2024-02-21 DIAGNOSIS — M25531 Pain in right wrist: Secondary | ICD-10-CM | POA: Diagnosis not present

## 2024-02-25 DIAGNOSIS — Z961 Presence of intraocular lens: Secondary | ICD-10-CM | POA: Diagnosis not present

## 2024-02-25 DIAGNOSIS — H40023 Open angle with borderline findings, high risk, bilateral: Secondary | ICD-10-CM | POA: Diagnosis not present

## 2024-02-25 DIAGNOSIS — H353121 Nonexudative age-related macular degeneration, left eye, early dry stage: Secondary | ICD-10-CM | POA: Diagnosis not present

## 2024-02-25 DIAGNOSIS — H40053 Ocular hypertension, bilateral: Secondary | ICD-10-CM | POA: Diagnosis not present

## 2024-02-25 DIAGNOSIS — H02403 Unspecified ptosis of bilateral eyelids: Secondary | ICD-10-CM | POA: Diagnosis not present

## 2024-02-26 DIAGNOSIS — Z01419 Encounter for gynecological examination (general) (routine) without abnormal findings: Secondary | ICD-10-CM | POA: Diagnosis not present

## 2024-02-26 DIAGNOSIS — M8580 Other specified disorders of bone density and structure, unspecified site: Secondary | ICD-10-CM | POA: Diagnosis not present

## 2024-03-03 DIAGNOSIS — Z803 Family history of malignant neoplasm of breast: Secondary | ICD-10-CM | POA: Diagnosis not present

## 2024-03-03 DIAGNOSIS — N6019 Diffuse cystic mastopathy of unspecified breast: Secondary | ICD-10-CM | POA: Diagnosis not present

## 2024-03-03 DIAGNOSIS — Z9189 Other specified personal risk factors, not elsewhere classified: Secondary | ICD-10-CM | POA: Diagnosis not present

## 2024-03-03 DIAGNOSIS — E7800 Pure hypercholesterolemia, unspecified: Secondary | ICD-10-CM | POA: Diagnosis not present

## 2024-03-03 DIAGNOSIS — I10 Essential (primary) hypertension: Secondary | ICD-10-CM | POA: Diagnosis not present

## 2024-03-03 DIAGNOSIS — Z888 Allergy status to other drugs, medicaments and biological substances status: Secondary | ICD-10-CM | POA: Diagnosis not present

## 2024-03-03 DIAGNOSIS — Z1231 Encounter for screening mammogram for malignant neoplasm of breast: Secondary | ICD-10-CM | POA: Diagnosis not present

## 2024-03-03 DIAGNOSIS — Z79899 Other long term (current) drug therapy: Secondary | ICD-10-CM | POA: Diagnosis not present

## 2024-03-04 DIAGNOSIS — M25641 Stiffness of right hand, not elsewhere classified: Secondary | ICD-10-CM | POA: Diagnosis not present

## 2024-03-09 DIAGNOSIS — Z803 Family history of malignant neoplasm of breast: Secondary | ICD-10-CM | POA: Diagnosis not present

## 2024-03-09 DIAGNOSIS — Z9189 Other specified personal risk factors, not elsewhere classified: Secondary | ICD-10-CM | POA: Diagnosis not present

## 2024-03-09 DIAGNOSIS — R928 Other abnormal and inconclusive findings on diagnostic imaging of breast: Secondary | ICD-10-CM | POA: Diagnosis not present

## 2024-03-11 DIAGNOSIS — M25641 Stiffness of right hand, not elsewhere classified: Secondary | ICD-10-CM | POA: Diagnosis not present

## 2024-03-12 DIAGNOSIS — M25641 Stiffness of right hand, not elsewhere classified: Secondary | ICD-10-CM | POA: Insufficient documentation

## 2024-03-13 DIAGNOSIS — M25641 Stiffness of right hand, not elsewhere classified: Secondary | ICD-10-CM | POA: Diagnosis not present

## 2024-03-16 ENCOUNTER — Other Ambulatory Visit (HOSPITAL_COMMUNITY): Payer: Self-pay | Admitting: Cardiology

## 2024-03-19 DIAGNOSIS — S52571D Other intraarticular fracture of lower end of right radius, subsequent encounter for closed fracture with routine healing: Secondary | ICD-10-CM | POA: Diagnosis not present

## 2024-03-20 DIAGNOSIS — M25641 Stiffness of right hand, not elsewhere classified: Secondary | ICD-10-CM | POA: Diagnosis not present

## 2024-03-26 DIAGNOSIS — M25641 Stiffness of right hand, not elsewhere classified: Secondary | ICD-10-CM | POA: Diagnosis not present

## 2024-04-01 DIAGNOSIS — M25641 Stiffness of right hand, not elsewhere classified: Secondary | ICD-10-CM | POA: Diagnosis not present

## 2024-04-07 ENCOUNTER — Encounter (HOSPITAL_COMMUNITY): Payer: Self-pay | Admitting: Cardiology

## 2024-04-07 ENCOUNTER — Ambulatory Visit (HOSPITAL_COMMUNITY)
Admission: RE | Admit: 2024-04-07 | Discharge: 2024-04-07 | Disposition: A | Discharge status: Home / Self Care | Source: Ambulatory Visit | Attending: Cardiology | Admitting: Cardiology

## 2024-04-07 VITALS — BP 158/86 | HR 59 | Ht 63.0 in | Wt 160.2 lb

## 2024-04-07 DIAGNOSIS — Z7901 Long term (current) use of anticoagulants: Secondary | ICD-10-CM | POA: Insufficient documentation

## 2024-04-07 DIAGNOSIS — Z8572 Personal history of non-Hodgkin lymphomas: Secondary | ICD-10-CM | POA: Diagnosis not present

## 2024-04-07 DIAGNOSIS — I10 Essential (primary) hypertension: Secondary | ICD-10-CM

## 2024-04-07 DIAGNOSIS — Z79899 Other long term (current) drug therapy: Secondary | ICD-10-CM | POA: Insufficient documentation

## 2024-04-07 DIAGNOSIS — I5022 Chronic systolic (congestive) heart failure: Secondary | ICD-10-CM | POA: Diagnosis not present

## 2024-04-07 DIAGNOSIS — E785 Hyperlipidemia, unspecified: Secondary | ICD-10-CM | POA: Insufficient documentation

## 2024-04-07 DIAGNOSIS — I110 Hypertensive heart disease with heart failure: Secondary | ICD-10-CM | POA: Diagnosis not present

## 2024-04-07 DIAGNOSIS — G4733 Obstructive sleep apnea (adult) (pediatric): Secondary | ICD-10-CM | POA: Insufficient documentation

## 2024-04-07 DIAGNOSIS — I480 Paroxysmal atrial fibrillation: Secondary | ICD-10-CM | POA: Diagnosis not present

## 2024-04-07 MED ORDER — TELMISARTAN-HCTZ 80-12.5 MG PO TABS: 1.0000 | ORAL_TABLET | Freq: Every day | ORAL | 3 refills | Status: DC

## 2024-04-07 NOTE — Progress Notes (Signed)
 Patient ID: Colleen White, female   DOB: 09-02-1945, 78 y.o.   MRN: [id number] PCP: Dr. Joshua Cardiology: Dr. Rolan  Chief complaint: atrial fibrillation  78 y.o. with history of HTN, hyperlipidemia, paroxysmal atrial fibrillation and prior non-Hodgkins lymphoma presents for followup of atrial fibrillation.  She called the office in 1/17 to report increased episodes of palpitations.  She had had rare tachypalpitations for years, but in 1/17 she had 2 episodes of tachypalpitations close together.  I had her wear an event monitor.  By monitor, her tachypalpitations correlated with atrial fibrillation with RVR.  She was started on Xarelto  and Toprol  XL.  Toprol  XL caused swelling, so it was stopped and diltiazem  CD was begun.  Echo in 3/17 showed EF 65-70% with mildly elevated LVOT velocity due to vigorous contraction.  BP is high today but she is worried about her husband who is feeling poorly.  SBP 120s-130s at home (she brings readings).  She is in NSR today, no palpitations.  No significant exertional dyspnea or chest pain.  Weight down 2 lbs.  No BRBPR/melena. She has some generalized fatigue that has been present for a long time.   ECG: NSR, poor RWP (personally reviewed)  Labs (8/24): K 3.8, creatinine 0.73 Labs (6/25): K 3.7, creatinine 0.77, LDL 62, TGs 99  PMH: 1. Echo (11/15): EF 60-65%, trivial MR. Echo (3/17) with EF 65-70%, mildly elevated LVOT velocity due to vigorous contraction. 2. Chest pain: Myoview normal in 2/05. Stress echo (11/15) with no evidence for ischemia or infarction.  - Coronary calcium score 28.4 AU in 2/25, 31st percentile.  3. Suspected glaucoma 4. HTN 5. Hyperlipidemia 6. GERD 7. Diverticulosis 8. TAH/BSO 9. Non-Hodgkins lymphoma: In remission.  Treated at [facility] with surgery and XRT.  Did not have chemotherapy.  10. Pulmonary nodules: Presumed benign after observation.  11. Atrial fibrillation: Paroxysmal.  12. OSA: Uses CPAP.  13. COVID-19 9/22 14.  Spondylolisthesis  SH: Married, cares for elderly mother, nonsmoker.  Lives in [place].  FH: Mother with atrial fibrillation and CAD.   ROS: All systems reviewed and negative except as per HPI.   Current Outpatient Medications  Medication Sig Dispense Refill   Coenzyme Q10 (CO Q 10) 100 MG CAPS Take 1 tablet by mouth daily.  (Patient taking differently: Take 100 mg by mouth daily.)     cyanocobalamin  50 MCG tablet Take 50 mcg by mouth daily. (Patient taking differently: Take 500 mcg by mouth daily.)     dicyclomine  (BENTYL ) 10 MG capsule Take 10 mg by mouth as needed for spasms.     diltiazem  (CARDIZEM  CD) 120 MG 24 hr capsule Take 1 capsule (120 mg total) by mouth daily. PLEASE SCHEDULE APPOINTMENT FOR MORE REFILLS 90 capsule 0   docusate sodium (COLACE) 100 MG capsule Take 200 mg by mouth as needed.     esomeprazole  (NEXIUM ) 20 MG packet Take 20 mg by mouth at bedtime. Takes as needed.     fluocinonide (LIDEX) 0.05 % external solution Apply 1 Application topically as directed. Itchy Scalp     gabapentin  (NEURONTIN ) 100 MG capsule Take 3 capsules (300 mg total) by mouth at bedtime. 270 capsule 1   loratadine (CLARITIN) 10 MG tablet Take by mouth daily as needed.      MINOXIDIL FOR MEN EX Apply topically. Takes as needed at night.     mirtazapine  (REMERON ) 15 MG tablet Take 7.5 mg by mouth as needed. Takes 1/4 tablet at bedtime for itching of scalp  Multiple Vitamins-Minerals (PRESERVISION AREDS 2 PO) Take 1 tablet by mouth 2 (two) times daily.      potassium chloride  (MICRO-K ) 10 MEQ CR capsule Take 1 capsule (10 mEq total) by mouth 2 (two) times daily. (Patient taking differently: Take 10 mEq by mouth daily.) 180 capsule 0   pravastatin  (PRAVACHOL ) 40 MG tablet TAKE ONE TABLET BY MOUTH AT BEDTIME 90 tablet 1   Probiotic Product (PROBIOTIC DAILY PO) Take 1 tablet by mouth daily.     rivaroxaban  (XARELTO ) 20 MG TABS tablet TAKE ONE TABLET BY MOUTH with SUPPER 30 tablet 1    telmisartan -hydrochlorothiazide (MICARDIS  HCT) 80-12.5 MG tablet Take 1 tablet by mouth daily. 30 tablet 3   traMADol  (ULTRAM ) 50 MG tablet TAKE ONE TABLET BY MOUTH EVERY 6 HOURS AS NEEDED 65 tablet 2   tretinoin (RETIN-A) 0.05 % cream Once daily     Ascorbic Acid (VITAMIN C PO) Take 500 mg by mouth daily.     brimonidine-timolol (COMBIGAN) 0.2-0.5 % ophthalmic solution Place 1 drop into both eyes every 12 (twelve) hours.      chlorpheniramine-HYDROcodone  (TUSSIONEX) 10-8 MG/5ML Take 5 mLs by mouth every 12 (twelve) hours as needed for cough. 115 mL 0   Cholecalciferol  (VITAMIN D3) 1000 units CAPS Take 1,000 Units by mouth.      No current facility-administered medications for this encounter.    BP (!) 158/86   Pulse (!) 59   Ht 5' 3 (1.6 m)   Wt 72.7 kg (160 lb 3.2 oz)   SpO2 99%   BMI 28.38 kg/m  General: NAD Neck: No JVD, no thyromegaly or thyroid  nodule.  Lungs: Clear to auscultation bilaterally with normal respiratory effort. CV: Nondisplaced PMI.  Heart regular S1/S2, no S3/S4, no murmur.  No peripheral edema.  No carotid bruit.  Normal pedal pulses.  Abdomen: Soft, nontender, no hepatosplenomegaly, no distention.  Skin: Intact without lesions or rashes.  Neurologic: Alert and oriented x 3.  Psych: Normal affect. Extremities: No clubbing or cyanosis.  HEENT: Normal.   Assessment/Plan: 1. HTN: SBP 130s at times.  - She is currently on Micardis  80 mg daily, I will restart her on Micardis /HCT 80/12.5 daily.  She was taken off hydrochlorothiazide to see if it would improve her fatigue but it did not change her symptoms to stop.  Check BMET in 10 days. 2. Hyperlipidemia: She is on pravastatin , good lipids in 6/25.     3. Atrial fibrillation: Paroxysmal.  First noted in 1/17.  Rare palpitations.   CHADSVASC = 3.  Best route for prevention will be to continue to control OSA and HTN.  - Continue diltiazem  CD 120 daily.  - Continue Xarelto  20 daily. I will get CBC in 10 days with  BMET. - Continue CPAP 4. OSA: Using CPAP.  5. Cardiac disease risk:  Calcium score in 2/25 was in the 31st percentile, overall low risk.  No chest pain.   Followup in 6 months.    I spent 21 minutes reviewing records, interviewing/examining patient, and managing orders.   Ezra Shuck 04/07/2024

## 2024-04-07 NOTE — Patient Instructions (Signed)
 Great to see you today!!!  STOP Telmisartan   START Telmisartan /hydrochlorothiazide 80/12.5 mg Daily  Your physician recommends that you return for lab work in: 1-2 weeks  Your physician recommends that you schedule a follow-up appointment in: 6 months (February 2026), **PLEASE CALL OUR OFFICE IN DECEMBER TO SCHEDULE THIS APPOINTMENT  If you have any questions or concerns before your next appointment please send us  a message through [place] or call our office at [phone].    TO LEAVE A MESSAGE FOR THE NURSE SELECT OPTION 2, PLEASE LEAVE A MESSAGE INCLUDING: YOUR NAME DATE OF BIRTH CALL BACK NUMBER REASON FOR CALL**this is important as we prioritize the call backs  YOU WILL RECEIVE A CALL BACK THE SAME DAY AS LONG AS YOU CALL BEFORE 4:00 PM  At the [facility], you and your health needs are our priority. As part of our continuing mission to provide you with exceptional heart care, we have created designated Provider Care Teams. These Care Teams include your primary Cardiologist (physician) and Advanced Practice Providers (APPs- Physician Assistants and Nurse Practitioners) who all work together to provide you with the care you need, when you need it.   You may see any of the following providers on your designated Care Team at your next follow up: Dr Toribio Fuel Dr Ezra Shuck Dr. Ria Commander Dr. Morene Brownie Amy Lenetta, NP Caffie Shed, [place] [facility] [place], [place] Beckey Coe, NP [place] Lee, NP Ellouise Class, NP Tinnie Redman, PharmD Jaun Bash, PharmD   Please be sure to bring in all your medications bottles to every appointment.    Thank you for choosing [facility]

## 2024-04-10 DIAGNOSIS — M25641 Stiffness of right hand, not elsewhere classified: Secondary | ICD-10-CM | POA: Diagnosis not present

## 2024-04-14 DIAGNOSIS — M25641 Stiffness of right hand, not elsewhere classified: Secondary | ICD-10-CM | POA: Diagnosis not present

## 2024-04-14 DIAGNOSIS — S52571D Other intraarticular fracture of lower end of right radius, subsequent encounter for closed fracture with routine healing: Secondary | ICD-10-CM | POA: Diagnosis not present

## 2024-04-14 DIAGNOSIS — S52571A Other intraarticular fracture of lower end of right radius, initial encounter for closed fracture: Secondary | ICD-10-CM | POA: Diagnosis not present

## 2024-04-17 ENCOUNTER — Ambulatory Visit (HOSPITAL_COMMUNITY)
Admission: RE | Admit: 2024-04-17 | Discharge: 2024-04-17 | Disposition: A | Discharge status: Home / Self Care | Source: Ambulatory Visit | Attending: Cardiology | Admitting: Cardiology

## 2024-04-17 DIAGNOSIS — I5022 Chronic systolic (congestive) heart failure: Secondary | ICD-10-CM | POA: Diagnosis not present

## 2024-04-17 LAB — BASIC METABOLIC PANEL WITH GFR
Anion gap: 9 (ref 5–15)
BUN: 17 mg/dL (ref 8–23)
CO2: 30 mmol/L (ref 22–32)
Calcium: 9.9 mg/dL (ref 8.9–10.3)
Chloride: 101 mmol/L (ref 98–111)
Creatinine, Ser: 0.79 mg/dL (ref 0.44–1.00)
GFR, Estimated: >60 mL/min (ref >60)
Glucose, Bld: 101 mg/dL — ABNORMAL HIGH (ref 70–99)
Potassium: 4 mmol/L (ref 3.5–5.1)
Sodium: 140 mmol/L (ref 135–145)

## 2024-04-18 ENCOUNTER — Ambulatory Visit (HOSPITAL_COMMUNITY): Payer: Self-pay | Admitting: Cardiology

## 2024-04-20 ENCOUNTER — Other Ambulatory Visit (HOSPITAL_COMMUNITY): Payer: Self-pay | Admitting: Cardiology

## 2024-04-21 DIAGNOSIS — C8230 Follicular lymphoma grade IIIa, unspecified site: Secondary | ICD-10-CM | POA: Diagnosis not present

## 2024-04-23 DIAGNOSIS — M25641 Stiffness of right hand, not elsewhere classified: Secondary | ICD-10-CM | POA: Diagnosis not present

## 2024-04-30 DIAGNOSIS — M25641 Stiffness of right hand, not elsewhere classified: Secondary | ICD-10-CM | POA: Diagnosis not present

## 2024-05-12 ENCOUNTER — Ambulatory Visit: Admitting: Internal Medicine

## 2024-05-13 DIAGNOSIS — L570 Actinic keratosis: Secondary | ICD-10-CM | POA: Diagnosis not present

## 2024-05-13 DIAGNOSIS — R208 Other disturbances of skin sensation: Secondary | ICD-10-CM | POA: Diagnosis not present

## 2024-05-13 DIAGNOSIS — L658 Other specified nonscarring hair loss: Secondary | ICD-10-CM | POA: Diagnosis not present

## 2024-05-13 DIAGNOSIS — L821 Other seborrheic keratosis: Secondary | ICD-10-CM | POA: Diagnosis not present

## 2024-05-14 DIAGNOSIS — M25641 Stiffness of right hand, not elsewhere classified: Secondary | ICD-10-CM | POA: Diagnosis not present

## 2024-05-26 ENCOUNTER — Ambulatory Visit: Admitting: Internal Medicine

## 2024-05-26 ENCOUNTER — Encounter: Payer: Self-pay | Admitting: Internal Medicine

## 2024-05-26 VITALS — BP 118/68 | HR 59 | Temp 97.9°F | Resp 16 | Ht 63.0 in | Wt 158.8 lb

## 2024-05-26 DIAGNOSIS — Z23 Encounter for immunization: Secondary | ICD-10-CM

## 2024-05-26 DIAGNOSIS — I10 Essential (primary) hypertension: Secondary | ICD-10-CM | POA: Diagnosis not present

## 2024-05-26 DIAGNOSIS — I480 Paroxysmal atrial fibrillation: Secondary | ICD-10-CM | POA: Diagnosis not present

## 2024-05-26 NOTE — Progress Notes (Signed)
 Subjective:  Patient ID: Colleen White, female    DOB: [birth year]  Age: 78 y.o. MRN: [id number]  CC: Hypertension and Hyperlipidemia   HPI SIEARRA White presents for f/up ---  Discussed the use of AI scribe software for clinical note transcription with the patient, who gave verbal consent to proceed.  History of Present Illness Colleen White is a 78 year old female with hypertension who presents for evaluation of her blood pressure management.  Her blood pressure has been stable over the last few weeks, with readings around 125/70 mmHg. She took her blood pressure medication at 9:00 AM today.  During a follow-up visit on August 12, her blood pressure was 183/158 mmHg. At that time, she was not taking hydrochlorothiazide (HCTZ) and was advised to restart it. Since restarting the medication, she has not experienced any side effects such as weakness, dizziness, lightheadedness, cramping, swelling, chest pain, shortness of breath, or irregular heartbeats.  Her lab studies from three months ago showed elevated levels of B1, B6, and zinc . She stopped taking multivitamins for two months but has since resumed them. She attributes the elevated zinc  levels to her diet, which includes foods like salmon, beans, and bananas, and to her use of PreserVision, which contains 40 mg of zinc  and is taken twice daily for her eyes. She denies symptoms of neuropathy such as numbness, weakness, tingling, or pain.     Outpatient Medications Prior to Visit  Medication Sig Dispense Refill   Ascorbic Acid (VITAMIN C PO) Take 500 mg by mouth daily.     brimonidine-timolol (COMBIGAN) 0.2-0.5 % ophthalmic solution Place 1 drop into both eyes every 12 (twelve) hours.      chlorpheniramine-HYDROcodone  (TUSSIONEX) 10-8 MG/5ML Take 5 mLs by mouth every 12 (twelve) hours as needed for cough. 115 mL 0   Cholecalciferol  (VITAMIN D3) 1000 units CAPS Take 1,000 Units by mouth.      Coenzyme Q10 (CO Q 10) 100  MG CAPS Take 1 tablet by mouth daily.  (Patient taking differently: Take 100 mg by mouth daily.)     cyanocobalamin  50 MCG tablet Take 50 mcg by mouth daily. (Patient taking differently: Take 500 mcg by mouth daily.)     dicyclomine  (BENTYL ) 10 MG capsule Take 10 mg by mouth as needed for spasms.     diltiazem  (CARDIZEM  CD) 120 MG 24 hr capsule Take 1 capsule (120 mg total) by mouth daily. PLEASE SCHEDULE APPOINTMENT FOR MORE REFILLS 90 capsule 0   docusate sodium (COLACE) 100 MG capsule Take 200 mg by mouth as needed.     esomeprazole  (NEXIUM ) 20 MG packet Take 20 mg by mouth at bedtime. Takes as needed.     fluocinonide (LIDEX) 0.05 % external solution Apply 1 Application topically as directed. Itchy Scalp     gabapentin  (NEURONTIN ) 100 MG capsule Take 3 capsules (300 mg total) by mouth at bedtime. 270 capsule 1   loratadine (CLARITIN) 10 MG tablet Take by mouth daily as needed.      MINOXIDIL FOR MEN EX Apply topically. Takes as needed at night.     mirtazapine  (REMERON ) 15 MG tablet Take 7.5 mg by mouth as needed. Takes 1/4 tablet at bedtime for itching of scalp     Multiple Vitamins-Minerals (PRESERVISION AREDS 2 PO) Take 1 tablet by mouth 2 (two) times daily.      potassium chloride  (MICRO-K ) 10 MEQ CR capsule Take 1 capsule (10 mEq total) by mouth 2 (two) times daily. (Patient taking differently:  Take 10 mEq by mouth daily.) 180 capsule 0   pravastatin  (PRAVACHOL ) 40 MG tablet TAKE ONE TABLET BY MOUTH AT BEDTIME 90 tablet 1   Probiotic Product (PROBIOTIC DAILY PO) Take 1 tablet by mouth daily.     rivaroxaban  (XARELTO ) 20 MG TABS tablet Take 1 tablet (20 mg total) by mouth daily with supper. 30 tablet 1   telmisartan -hydrochlorothiazide (MICARDIS  HCT) 80-12.5 MG tablet Take 1 tablet by mouth daily. 30 tablet 3   traMADol  (ULTRAM ) 50 MG tablet TAKE ONE TABLET BY MOUTH EVERY 6 HOURS AS NEEDED 65 tablet 2   tretinoin (RETIN-A) 0.05 % cream Once daily     No facility-administered medications  prior to visit.    ROS Review of Systems  Constitutional:  Negative for appetite change, chills, diaphoresis and fatigue.  HENT: Negative.    Eyes: Negative.   Respiratory: Negative.  Negative for cough, chest tightness, shortness of breath and wheezing.   Cardiovascular:  Negative for chest pain, palpitations and leg swelling.  Gastrointestinal: Negative.  Negative for abdominal pain, constipation, diarrhea, nausea and vomiting.  Endocrine: Negative.   Genitourinary: Negative.  Negative for difficulty urinating.  Musculoskeletal:  Positive for arthralgias. Negative for myalgias.  Skin: Negative.   Neurological: Negative.  Negative for dizziness, weakness and light-headedness.  Hematological:  Negative for adenopathy. Does not bruise/bleed easily.    Objective:  BP 118/68 (BP Location: Left Arm, Patient Position: Sitting, Cuff Size: Small)   Pulse (!) 59   Temp 97.9 F (36.6 C) (Oral)   Resp 16   Ht 5' 3 (1.6 m)   Wt 158 lb 12.8 oz (72 kg)   SpO2 97%   BMI 28.13 kg/m   BP Readings from Last 3 Encounters:  05/26/24 118/68  04/07/24 (!) 158/86  02/17/24 138/86    Wt Readings from Last 3 Encounters:  05/26/24 158 lb 12.8 oz (72 kg)  04/07/24 160 lb 3.2 oz (72.7 kg)  02/17/24 158 lb 8 oz (71.9 kg)    Physical Exam Vitals reviewed.  HENT:     Mouth/Throat:     Mouth: Mucous membranes are moist.  Eyes:     General: No scleral icterus.    Conjunctiva/sclera: Conjunctivae normal.  Cardiovascular:     Rate and Rhythm: Normal rate and regular rhythm.     Heart sounds: No murmur heard.    No friction rub. No gallop.  Pulmonary:     Effort: Pulmonary effort is normal.     Breath sounds: No stridor. No wheezing, rhonchi or rales.  Abdominal:     General: Abdomen is flat.     Palpations: There is no mass.     Tenderness: There is no abdominal tenderness. There is no guarding.     Hernia: No hernia is present.  Musculoskeletal:        General: Normal range of motion.      Cervical back: Neck supple.     Right lower leg: No edema.     Left lower leg: No edema.  Lymphadenopathy:     Cervical: No cervical adenopathy.  Skin:    General: Skin is warm and dry.  Neurological:     General: No focal deficit present.     Mental Status: She is alert.  Psychiatric:        Mood and Affect: Mood normal.        Behavior: Behavior normal.     Lab Results  Component Value Date   WBC 5.0 02/17/2024  HGB 13.5 02/17/2024   HCT 40.1 02/17/2024   PLT 214.0 02/17/2024   GLUCOSE 101 (H) 04/17/2024   CHOL 129 02/17/2024   TRIG 99.0 02/17/2024   HDL 47.40 02/17/2024   LDLDIRECT 142.5 09/30/2012   LDLCALC 62 02/17/2024   ALT 17 02/17/2024   AST 21 02/17/2024   NA 140 04/17/2024   K 4.0 04/17/2024   CL 101 04/17/2024   CREATININE 0.79 04/17/2024   BUN 17 04/17/2024   CO2 30 04/17/2024   TSH 1.78 02/17/2024   INR 1.7 (H) 03/06/2021   HGBA1C 5.8 09/10/2018    No results found.  Assessment & Plan:  Need for immunization against influenza -     Flu vaccine HIGH DOSE PF(Fluzone Trivalent)  Paroxysmal atrial fibrillation (HCC)- She has good R/R controlled.  Essential hypertension- BP is over-controlled but she is asx.     Follow-up: Return in about 3 months (around 08/25/2024).  Debby Molt, MD

## 2024-05-26 NOTE — Patient Instructions (Signed)
 Hypertension, Adult High blood pressure (hypertension) is when the force of blood pumping through the arteries is too strong. The arteries are the blood vessels that carry blood from the heart throughout the body. Hypertension forces the heart to work harder to pump blood and may cause arteries to become narrow or stiff. Untreated or uncontrolled hypertension can lead to a heart attack, heart failure, a stroke, kidney disease, and other problems. A blood pressure reading consists of a higher number over a lower number. Ideally, your blood pressure should be below 120/80. The first ("top") number is called the systolic pressure. It is a measure of the pressure in your arteries as your heart beats. The second ("bottom") number is called the diastolic pressure. It is a measure of the pressure in your arteries as the heart relaxes. What are the causes? The exact cause of this condition is not known. There are some conditions that result in high blood pressure. What increases the risk? Certain factors may make you more likely to develop high blood pressure. Some of these risk factors are under your control, including: Smoking. Not getting enough exercise or physical activity. Being overweight. Having too much fat, sugar, calories, or salt (sodium) in your diet. Drinking too much alcohol. Other risk factors include: Having a personal history of heart disease, diabetes, high cholesterol, or kidney disease. Stress. Having a family history of high blood pressure and high cholesterol. Having obstructive sleep apnea. Age. The risk increases with age. What are the signs or symptoms? High blood pressure may not cause symptoms. Very high blood pressure (hypertensive crisis) may cause: Headache. Fast or irregular heartbeats (palpitations). Shortness of breath. Nosebleed. Nausea and vomiting. Vision changes. Severe chest pain, dizziness, and seizures. How is this diagnosed? This condition is diagnosed by  measuring your blood pressure while you are seated, with your arm resting on a flat surface, your legs uncrossed, and your feet flat on the floor. The cuff of the blood pressure monitor will be placed directly against the skin of your upper arm at the level of your heart. Blood pressure should be measured at least twice using the same arm. Certain conditions can cause a difference in blood pressure between your right and left arms. If you have a high blood pressure reading during one visit or you have normal blood pressure with other risk factors, you may be asked to: Return on a different day to have your blood pressure checked again. Monitor your blood pressure at home for 1 week or longer. If you are diagnosed with hypertension, you may have other blood or imaging tests to help your health care provider understand your overall risk for other conditions. How is this treated? This condition is treated by making healthy lifestyle changes, such as eating healthy foods, exercising more, and reducing your alcohol intake. You may be referred for counseling on a healthy diet and physical activity. Your health care provider may prescribe medicine if lifestyle changes are not enough to get your blood pressure under control and if: Your systolic blood pressure is above 130. Your diastolic blood pressure is above 80. Your personal target blood pressure may vary depending on your medical conditions, your age, and other factors. Follow these instructions at home: Eating and drinking  Eat a diet that is high in fiber and potassium, and low in sodium, added sugar, and fat. An example of this eating plan is called the DASH diet. DASH stands for Dietary Approaches to Stop Hypertension. To eat this way: Eat  plenty of fresh fruits and vegetables. Try to fill one half of your plate at each meal with fruits and vegetables. Eat whole grains, such as whole-wheat pasta, brown rice, or whole-grain bread. Fill about one  fourth of your plate with whole grains. Eat or drink low-fat dairy products, such as skim milk or low-fat yogurt. Avoid fatty cuts of meat, processed or cured meats, and poultry with skin. Fill about one fourth of your plate with lean proteins, such as fish, chicken without skin, beans, eggs, or tofu. Avoid pre-made and processed foods. These tend to be higher in sodium, added sugar, and fat. Reduce your daily sodium intake. Many people with hypertension should eat less than 1,500 mg of sodium a day. Do not drink alcohol if: Your health care provider tells you not to drink. You are pregnant, may be pregnant, or are planning to become pregnant. If you drink alcohol: Limit how much you have to: 0-1 drink a day for women. 0-2 drinks a day for men. Know how much alcohol is in your drink. In the [place] one drink equals one 12 oz bottle of beer (355 mL), one 5 oz glass of wine (148 mL), or one 1 oz glass of hard liquor (44 mL). Lifestyle  Work with your health care provider to maintain a healthy body weight or to lose weight. Ask what an ideal weight is for you. Get at least 30 minutes of exercise that causes your heart to beat faster (aerobic exercise) most days of the week. Activities may include walking, swimming, or biking. Include exercise to strengthen your muscles (resistance exercise), such as Pilates or lifting weights, as part of your weekly exercise routine. Try to do these types of exercises for 30 minutes at least 3 days a week. Do not use any products that contain nicotine or tobacco. These products include cigarettes, chewing tobacco, and vaping devices, such as e-cigarettes. If you need help quitting, ask your health care provider. Monitor your blood pressure at home as told by your health care provider. Keep all follow-up visits. This is important. Medicines Take over-the-counter and prescription medicines only as told by your health care provider. Follow directions carefully. Blood  pressure medicines must be taken as prescribed. Do not skip doses of blood pressure medicine. Doing this puts you at risk for problems and can make the medicine less effective. Ask your health care provider about side effects or reactions to medicines that you should watch for. Contact a health care provider if you: Think you are having a reaction to a medicine you are taking. Have headaches that keep coming back (recurring). Feel dizzy. Have swelling in your ankles. Have trouble with your vision. Get help right away if you: Develop a severe headache or confusion. Have unusual weakness or numbness. Feel faint. Have severe pain in your chest or abdomen. Vomit repeatedly. Have trouble breathing. These symptoms may be an emergency. Get help right away. Call 911. Do not wait to see if the symptoms will go away. Do not drive yourself to the hospital. Summary Hypertension is when the force of blood pumping through your arteries is too strong. If this condition is not controlled, it may put you at risk for serious complications. Your personal target blood pressure may vary depending on your medical conditions, your age, and other factors. For most people, a normal blood pressure is less than 120/80. Hypertension is treated with lifestyle changes, medicines, or a combination of both. Lifestyle changes include losing weight, eating a healthy,  low-sodium diet, exercising more, and limiting alcohol. This information is not intended to replace advice given to you by your health care provider. Make sure you discuss any questions you have with your health care provider. Document Revised: 06/20/2021 Document Reviewed: 06/20/2021 Elsevier Patient Education  2024 [employer].

## 2024-05-28 DIAGNOSIS — M25641 Stiffness of right hand, not elsewhere classified: Secondary | ICD-10-CM | POA: Diagnosis not present

## 2024-06-05 ENCOUNTER — Other Ambulatory Visit: Payer: Self-pay | Admitting: Family

## 2024-06-05 DIAGNOSIS — M150 Primary generalized (osteo)arthritis: Secondary | ICD-10-CM

## 2024-06-06 ENCOUNTER — Emergency Department (HOSPITAL_COMMUNITY)

## 2024-06-06 ENCOUNTER — Emergency Department (HOSPITAL_COMMUNITY)
Admission: EM | Admit: 2024-06-06 | Discharge: 2024-06-06 | Disposition: A | Discharge status: Home / Self Care | Attending: Emergency Medicine | Admitting: Emergency Medicine

## 2024-06-06 DIAGNOSIS — R609 Edema, unspecified: Secondary | ICD-10-CM | POA: Diagnosis not present

## 2024-06-06 DIAGNOSIS — M542 Cervicalgia: Secondary | ICD-10-CM | POA: Diagnosis not present

## 2024-06-06 DIAGNOSIS — M25512 Pain in left shoulder: Secondary | ICD-10-CM | POA: Diagnosis not present

## 2024-06-06 DIAGNOSIS — R0789 Other chest pain: Secondary | ICD-10-CM | POA: Insufficient documentation

## 2024-06-06 DIAGNOSIS — Y9301 Activity, walking, marching and hiking: Secondary | ICD-10-CM | POA: Diagnosis not present

## 2024-06-06 DIAGNOSIS — Z043 Encounter for examination and observation following other accident: Secondary | ICD-10-CM | POA: Diagnosis not present

## 2024-06-06 DIAGNOSIS — S0012XA Contusion of left eyelid and periocular area, initial encounter: Secondary | ICD-10-CM | POA: Insufficient documentation

## 2024-06-06 DIAGNOSIS — S199XXA Unspecified injury of neck, initial encounter: Secondary | ICD-10-CM | POA: Diagnosis not present

## 2024-06-06 DIAGNOSIS — M8580 Other specified disorders of bone density and structure, unspecified site: Secondary | ICD-10-CM | POA: Diagnosis not present

## 2024-06-06 DIAGNOSIS — I480 Paroxysmal atrial fibrillation: Secondary | ICD-10-CM | POA: Diagnosis not present

## 2024-06-06 DIAGNOSIS — I10 Essential (primary) hypertension: Secondary | ICD-10-CM | POA: Insufficient documentation

## 2024-06-06 DIAGNOSIS — M25561 Pain in right knee: Secondary | ICD-10-CM | POA: Insufficient documentation

## 2024-06-06 DIAGNOSIS — S0990XA Unspecified injury of head, initial encounter: Secondary | ICD-10-CM | POA: Diagnosis not present

## 2024-06-06 DIAGNOSIS — W19XXXA Unspecified fall, initial encounter: Secondary | ICD-10-CM | POA: Diagnosis not present

## 2024-06-06 DIAGNOSIS — M25552 Pain in left hip: Secondary | ICD-10-CM | POA: Diagnosis not present

## 2024-06-06 DIAGNOSIS — M1711 Unilateral primary osteoarthritis, right knee: Secondary | ICD-10-CM | POA: Diagnosis not present

## 2024-06-06 DIAGNOSIS — Z9181 History of falling: Secondary | ICD-10-CM | POA: Diagnosis not present

## 2024-06-06 DIAGNOSIS — M19012 Primary osteoarthritis, left shoulder: Secondary | ICD-10-CM | POA: Diagnosis not present

## 2024-06-06 DIAGNOSIS — W108XXA Fall (on) (from) other stairs and steps, initial encounter: Secondary | ICD-10-CM | POA: Insufficient documentation

## 2024-06-06 DIAGNOSIS — M25461 Effusion, right knee: Secondary | ICD-10-CM | POA: Diagnosis not present

## 2024-06-06 DIAGNOSIS — M25562 Pain in left knee: Secondary | ICD-10-CM | POA: Diagnosis not present

## 2024-06-06 DIAGNOSIS — M19042 Primary osteoarthritis, left hand: Secondary | ICD-10-CM | POA: Diagnosis not present

## 2024-06-06 DIAGNOSIS — Z7901 Long term (current) use of anticoagulants: Secondary | ICD-10-CM | POA: Insufficient documentation

## 2024-06-06 DIAGNOSIS — M84442A Pathological fracture, left hand, initial encounter for fracture: Secondary | ICD-10-CM | POA: Diagnosis not present

## 2024-06-06 DIAGNOSIS — S0083XA Contusion of other part of head, initial encounter: Secondary | ICD-10-CM | POA: Diagnosis not present

## 2024-06-06 DIAGNOSIS — S8012XA Contusion of left lower leg, initial encounter: Secondary | ICD-10-CM | POA: Diagnosis not present

## 2024-06-06 DIAGNOSIS — M47812 Spondylosis without myelopathy or radiculopathy, cervical region: Secondary | ICD-10-CM | POA: Diagnosis not present

## 2024-06-06 DIAGNOSIS — R0781 Pleurodynia: Secondary | ICD-10-CM | POA: Diagnosis not present

## 2024-06-06 LAB — COMPREHENSIVE METABOLIC PANEL WITH GFR
ALT: 21 U/L (ref 0–44)
AST: 25 U/L (ref 15–41)
Albumin: 3.9 g/dL (ref 3.5–5.0)
Alkaline Phosphatase: 104 U/L (ref 38–126)
Anion gap: 13 (ref 5–15)
BUN: 10 mg/dL (ref 8–23)
CO2: 24 mmol/L (ref 22–32)
Calcium: 9.3 mg/dL (ref 8.9–10.3)
Chloride: 101 mmol/L (ref 98–111)
Creatinine, Ser: 0.81 mg/dL (ref 0.44–1.00)
GFR, Estimated: >60 mL/min (ref >60)
Glucose, Bld: 111 mg/dL — ABNORMAL HIGH (ref 70–99)
Potassium: 3.7 mmol/L (ref 3.5–5.1)
Sodium: 138 mmol/L (ref 135–145)
Total Bilirubin: 0.4 mg/dL (ref 0.0–1.2)
Total Protein: 6.8 g/dL (ref 6.5–8.1)

## 2024-06-06 LAB — CBC WITH DIFFERENTIAL/PLATELET
Abs Immature Granulocytes: 0.04 K/uL (ref 0.00–0.07)
Basophils Absolute: 0 K/uL (ref 0.0–0.1)
Basophils Relative: 1 %
Eosinophils Absolute: 0.1 K/uL (ref 0.0–0.5)
Eosinophils Relative: 1 %
HCT: 43.2 % (ref 36.0–46.0)
Hemoglobin: 14.4 g/dL (ref 12.0–15.0)
Immature Granulocytes: 1 %
Lymphocytes Relative: 25 %
Lymphs Abs: 1.5 K/uL (ref 0.7–4.0)
MCH: 30.6 pg (ref 26.0–34.0)
MCHC: 33.3 g/dL (ref 30.0–36.0)
MCV: 91.7 fL (ref 80.0–100.0)
Monocytes Absolute: 0.5 K/uL (ref 0.1–1.0)
Monocytes Relative: 9 %
Neutro Abs: 3.7 K/uL (ref 1.7–7.7)
Neutrophils Relative %: 63 %
Platelets: 204 K/uL (ref 150–400)
RBC: 4.71 MIL/uL (ref 3.87–5.11)
RDW: 12.6 % (ref 11.5–15.5)
WBC: 5.9 K/uL (ref 4.0–10.5)
nRBC: 0 % (ref 0.0–0.2)

## 2024-06-06 LAB — I-STAT CHEM 8, ED
BUN: 12 mg/dL (ref 8–23)
Calcium, Ion: 1.18 mmol/L (ref 1.15–1.40)
Chloride: 102 mmol/L (ref 98–111)
Creatinine, Ser: 0.8 mg/dL (ref 0.44–1.00)
Glucose, Bld: 110 mg/dL — ABNORMAL HIGH (ref 70–99)
HCT: 43 % (ref 36.0–46.0)
Hemoglobin: 14.6 g/dL (ref 12.0–15.0)
Potassium: 3.7 mmol/L (ref 3.5–5.1)
Sodium: 144 mmol/L (ref 135–145)
TCO2: 27 mmol/L (ref 22–32)

## 2024-06-06 MED ORDER — FENTANYL CITRATE (PF) 50 MCG/ML IJ SOSY
25.0000 ug | PREFILLED_SYRINGE | Freq: Once | INTRAMUSCULAR | Status: AC
  Administered 2024-06-06: 25 ug via INTRAVENOUS
  Filled 2024-06-06: qty 1

## 2024-06-06 NOTE — Progress Notes (Signed)
 Orthopedic Tech Progress Note Patient Details:  Colleen White 04-18-1946 [id number]  Level 2 trauma   Patient ID: Colleen White, female   DOB: 08/19/1946, 78 y.o.   MRN: [id number]  Colleen White 06/06/2024, 8:01 AM

## 2024-06-06 NOTE — ED Triage Notes (Signed)
 Pt BIB [facility] from home due to fall on thinners.  Pt is on Xarelto .  Bruise on left side of face and leg.  Pt took additional dose of Xarelto  this morning as well as last night.  Mechanical fall off porch onto concrete this morning around 0655.  Pt reports right side pain.  Left shoulder and left rib pain.  VS BP 182/92, HR 92, Resp 18

## 2024-06-06 NOTE — ED Notes (Signed)
 Pt ambulated well with tech

## 2024-06-06 NOTE — ED Notes (Signed)
 C Collar removed by PA Warren

## 2024-06-06 NOTE — ED Provider Notes (Signed)
 [facility] EMERGENCY DEPARTMENT AT [facility] Provider Note   CSN: [id number] Arrival date & time: 06/06/24  0730     Patient presents with: No chief complaint on file.   Colleen White is a 78 y.o. female patient with past medical history of obstructive sleep apnea, hypertension, hyperlipidemia, GERD, paroxysmal A-fib on Xarelto  presents to emergency room as a fall on thinners.  Patient reports that she was walking down the steps and she missed a step falling onto her head and the left side.  She is on Xarelto .  She did not lose consciousness, have seizure activity or change in mental status.  On arrival she denies any head or neck pain.  She primarily localizes her pain to the left side of her chest and left shoulder which happened after she fell.  She does not note any swelling in her feet and ankles.  No syncope or presyncope.  No seizure activity.   HPI     Prior to Admission medications   Medication Sig Start Date End Date Taking? Authorizing Provider  Ascorbic Acid (VITAMIN C PO) Take 500 mg by mouth daily.    [provider]  brimonidine-timolol (COMBIGAN) 0.2-0.5 % ophthalmic solution Place 1 drop into both eyes every 12 (twelve) hours.     [provider]  chlorpheniramine-HYDROcodone  (TUSSIONEX) 10-8 MG/5ML Take 5 mLs by mouth every 12 (twelve) hours as needed for cough. 09/09/23   Joshua Debby CROME, MD  Cholecalciferol  (VITAMIN D3) 1000 units CAPS Take 1,000 Units by mouth.     [provider]  Coenzyme Q10 (CO Q 10) 100 MG CAPS Take 1 tablet by mouth daily.  Patient taking differently: Take 100 mg by mouth daily.    [provider]  cyanocobalamin  50 MCG tablet Take 50 mcg by mouth daily. Patient taking differently: Take 500 mcg by mouth daily.    [provider]  dicyclomine  (BENTYL ) 10 MG capsule Take 10 mg by mouth as needed for spasms.    [provider]  diltiazem  (CARDIZEM  CD) 120 MG 24 hr capsule Take  1 capsule (120 mg total) by mouth daily. PLEASE SCHEDULE APPOINTMENT FOR MORE REFILLS 03/16/24   Rolan Ezra RAMAN, MD  docusate sodium (COLACE) 100 MG capsule Take 200 mg by mouth as needed.    [provider]  esomeprazole  (NEXIUM ) 20 MG packet Take 20 mg by mouth at bedtime. Takes as needed.    [provider]  fluocinonide (LIDEX) 0.05 % external solution Apply 1 Application topically as directed. Itchy Scalp 08/29/22   [provider]  gabapentin  (NEURONTIN ) 100 MG capsule Take 3 capsules (300 mg total) by mouth at bedtime. 09/05/20   Joshua Debby CROME, MD  loratadine (CLARITIN) 10 MG tablet Take by mouth daily as needed.     [provider]  MINOXIDIL FOR MEN EX Apply topically. Takes as needed at night.    [provider]  mirtazapine  (REMERON ) 15 MG tablet Take 7.5 mg by mouth as needed. Takes 1/4 tablet at bedtime for itching of scalp    [provider]  Multiple Vitamins-Minerals (PRESERVISION AREDS 2 PO) Take 1 tablet by mouth 2 (two) times daily.     [provider]  potassium chloride  (MICRO-K ) 10 MEQ CR capsule Take 1 capsule (10 mEq total) by mouth 2 (two) times daily. Patient taking differently: Take 10 mEq by mouth daily. 08/29/23   Joshua Debby CROME, MD  pravastatin  (PRAVACHOL ) 40 MG tablet TAKE ONE TABLET BY  MOUTH AT BEDTIME 12/04/23   Joshua Debby CROME, MD  Probiotic Product (PROBIOTIC DAILY PO) Take 1 tablet by mouth daily.    [provider]  rivaroxaban  (XARELTO ) 20 MG TABS tablet Take 1 tablet (20 mg total) by mouth daily with supper. 04/20/24   Rolan Ezra RAMAN, MD  telmisartan -hydrochlorothiazide (MICARDIS  HCT) 80-12.5 MG tablet Take 1 tablet by mouth daily. 04/07/24   Rolan Ezra RAMAN, MD  traMADol  (ULTRAM ) 50 MG tablet TAKE ONE TABLET BY MOUTH EVERY 6 HOURS AS NEEDED 01/17/24   Webb, Padonda B, FNP  tretinoin (RETIN-A) 0.05 % cream Once daily    [provider]    Allergies: Ibuprofen    Review of Systems   Musculoskeletal:  Positive for arthralgias.    Updated Vital Signs BP (!) 162/80   Pulse 68   Temp 98.8 F (37.1 C) (Oral)   Resp 18   Ht 5' 3 (1.6 m)   Wt 71.7 kg   SpO2 100%   BMI 27.99 kg/m   Physical Exam Vitals and nursing note reviewed.  Constitutional:      General: She is not in acute distress.    Appearance: She is not toxic-appearing.  HENT:     Head: Normocephalic and atraumatic.     Comments: Small contusion above left eyebrow.  Eye range of motion intact.  Pupils equal and reactive. Eyes:     General: No scleral icterus.    Conjunctiva/sclera: Conjunctivae normal.  Cardiovascular:     Rate and Rhythm: Normal rate and regular rhythm.     Pulses: Normal pulses.     Heart sounds: Normal heart sounds.  Pulmonary:     Effort: Pulmonary effort is normal. No respiratory distress.     Breath sounds: Normal breath sounds.  Chest:     Chest wall: Tenderness present.  Abdominal:     General: Abdomen is flat. Bowel sounds are normal.     Palpations: Abdomen is soft.     Tenderness: There is no abdominal tenderness.     Comments: No bruising over chest and abdomen.  Musculoskeletal:     Comments: Patient has tenderness to palpation over left lower chest wall, left shoulder, left hip and bilateral knees.  She is neurovascularly intact.  Skin:    General: Skin is warm and dry.     Findings: No lesion.  Neurological:     General: No focal deficit present.     Mental Status: She is alert and oriented to person, place, and time. Mental status is at baseline.     (all labs ordered are listed, but only abnormal results are displayed) Labs Reviewed  COMPREHENSIVE METABOLIC PANEL WITH GFR - Abnormal; Notable for the following components:      Result Value   Glucose, Bld 111 (*)    All other components within normal limits  I-STAT CHEM 8, ED - Abnormal; Notable for the following components:   Glucose, Bld 110 (*)    All other components within normal limits  CBC  WITH DIFFERENTIAL/PLATELET    EKG: None  Radiology: DG Knee 1-2 Views Right Result Date: 06/06/2024 EXAM: 2 VIEW(S) XRAY OF THE RIGHT KNEE 06/06/2024 08:56:00 AM COMPARISON: None available. CLINICAL HISTORY: fall; Triage notes: ; Pt BIB [facility] from home due to fall on thinners. Pt is on Xarelto . Bruise on left side of face and leg. Pt took additional dose of Xarelto  this morning as well as last night. Mechanical fall off porch onto concrete this morning around 0655.  Pt ; reports right side pain. Left shoulder and left rib pain. ; VS BP 182/92, HR 92, Resp 18 FINDINGS: BONES AND JOINTS: No acute fracture. No focal osseous lesion. No joint dislocation. Tiny joint effusion. Mild superior patellar degenerative spurring. Minimal medial compartment joint space narrowing and peripheral degenerative spurring consistent with osteoarthritis. SOFT TISSUES: The soft tissues are unremarkable. IMPRESSION: 1. No acute osseous abnormality. 2. Tiny joint effusion. Electronically signed by: Waddell Calk MD 06/06/2024 09:11 AM EDT RP Workstation: [id number]   DG Shoulder 1 View Left Result Date: 06/06/2024 EXAM: 1 VIEW XRAY OF THE LEFT SHOULDER 06/06/2024 08:56:00 AM COMPARISON: None available. CLINICAL HISTORY: Fall. Reason for exam: fall; Triage notes: ; Pt BIB [facility] from home due to fall on thinners. Pt is on Xarelto . Bruise on left side of face and leg. Pt took additional dose of Xarelto  this morning as well as last night. Mechanical fall off porch onto concrete this morning around 0655. Pt reports right side pain. Left shoulder and left rib pain. VS BP 182/92, HR 92, Resp 18. FINDINGS: BONES AND JOINTS: Glenohumeral joint is normally aligned. No acute fracture or dislocation. Mild degenerative changes of the acromioclavicular joint. SOFT TISSUES: No abnormal calcifications. Visualized lung is unremarkable. IMPRESSION: 1. No acute fracture or dislocation. Electronically signed by: Waddell Calk MD 06/06/2024 09:10 AM  EDT RP Workstation: [id number]   DG Pelvis Portable Result Date: 06/06/2024 EXAM: 1 or 2 VIEW(S) XRAY OF THE PELVIS 06/06/2024 08:56:00 AM COMPARISON: CT abdomen and pelvis from 05/01/2018 CLINICAL HISTORY: fall. Reason for exam: fall; Triage notes: ; Pt BIB [facility] from home due to fall on thinners. Pt is on Xarelto . Bruise on left side of face and leg. Pt took additional dose of Xarelto  this morning as well as last night. Mechanical fall off porch onto concrete this morning around 0655. Pt reports right side pain. Left shoulder and left rib pain. VS BP 182/92, HR 92, Resp 18 FINDINGS: BONES AND JOINTS: No acute fracture. No focal osseous lesion. No joint dislocation. Degenerative changes of the visualized lower lumbar spine. Mild degenerative changes of the hips. SOFT TISSUES: The soft tissues are unremarkable. IMPRESSION: 1. No acute fracture or dislocation. Electronically signed by: Waddell Calk MD 06/06/2024 09:09 AM EDT RP Workstation: [id number]   DG Knee 1-2 Views Left Result Date: 06/06/2024 EXAM: 1 or 2 VIEW(S) XRAY OF THE LEFT KNEE 06/06/2024 08:56:00 AM COMPARISON: None available. CLINICAL HISTORY: Fall 190176. Reason for exam: fall; Triage notes: ; Pt BIB [facility] from home due to fall on thinners. Pt is on Xarelto . Bruise on left side of face and leg. Pt took additional dose of Xarelto  this morning as well as last night. Mechanical fall off porch onto concrete this morning around 0655. Pt reports right side pain. Left shoulder and left rib pain. VS BP 182/92, HR 92, Resp 18 FINDINGS: BONES AND JOINTS: No acute fracture. No focal osseous lesion. No joint dislocation. No significant joint effusion. No significant degenerative changes. SOFT TISSUES: The soft tissues are unremarkable. IMPRESSION: 1. No acute fracture or dislocation. Electronically signed by: Waddell Calk MD 06/06/2024 09:08 AM EDT RP Workstation: [id number]   CT Cervical Spine Wo Contrast Result Date: 06/06/2024 EXAM: CT CERVICAL  SPINE WITHOUT CONTRAST 06/06/2024 08:12:27 AM TECHNIQUE: CT of the cervical spine was performed without the administration of intravenous contrast. Multiplanar reformatted images are provided for review. Automated exposure control, iterative reconstruction, and/or weight based adjustment of the mA/kV was utilized to reduce the radiation dose to as low  as reasonably achievable. COMPARISON: Cervical spine radiographs 02/26/2023. CLINICAL HISTORY: 78 year old female with polytrauma after a fall from a porch. Patient is on Xarelto . FINDINGS: CERVICAL SPINE: BONES AND ALIGNMENT: Normal cervical lordosis now. Chronic osteopenia. Stable vertebral height. No acute fracture or traumatic malalignment. DEGENERATIVE CHANGES: Very mild cervical spine degeneration. SOFT TISSUES: Negative noncontrast neck soft tissues. Negative noncontrast thoracic inlet. No prevertebral soft tissue swelling. IMPRESSION: 1. No acute traumatic injury identified in the cervical spine. 2. Very mild cervical spine degeneration. Electronically signed by: Helayne Hurst MD 06/06/2024 08:37 AM EDT RP Workstation: [id number]   CT Maxillofacial Wo Contrast Result Date: 06/06/2024 EXAM: CT OF THE FACE WITHOUT CONTRAST 06/06/2024 08:12:27 AM TECHNIQUE: CT of the face was performed without the administration of intravenous contrast. Multiplanar reformatted images are provided for review. Automated exposure control, iterative reconstruction, and/or weight based adjustment of the mA/kV was utilized to reduce the radiation dose to as low as reasonably achievable. COMPARISON: None available. CLINICAL HISTORY: 78 year old female with polytrauma, blunt. Fall on thinners (Xarelto ). Bruise on left face. Mechanical fall 06/06/2024 06:55 AM. FINDINGS: FACIAL BONES: No acute facial fracture. No mandibular dislocation. No suspicious bone lesion. No orbital wall fracture. ORBITS: Globes are intact. Chronic postoperative changes to the globes. Intraorbital soft tissues  remain normal. Mild lateral left periorbital soft tissue swelling and stranding. No inflammatory change. SINUSES AND MASTOIDS: Mild bilateral left ethmoid sinus inflammation. Other paranasal sinuses and mastoids are negative. SOFT TISSUES: No soft tissue gas or other superficial soft tissue injury. IMPRESSION: 1. Mild left periorbital soft tissue injury. No facial fracture. 2. Mild bilateral sinus inflammatory changes. Electronically signed by: Helayne Hurst MD 06/06/2024 08:35 AM EDT RP Workstation: [id number]   CT Head Wo Contrast Result Date: 06/06/2024 EXAM: CT HEAD WITHOUT CONTRAST 06/06/2024 08:12:27 AM TECHNIQUE: CT of the head was performed without the administration of intravenous contrast. Automated exposure control, iterative reconstruction, and/or weight based adjustment of the mA/kV was utilized to reduce the radiation dose to as low as reasonably achievable. COMPARISON: February 26, 2023. CLINICAL HISTORY: 78 year old female, polytrauma. FINDINGS: BRAIN AND VENTRICLES: No acute hemorrhage. No evidence of acute infarct. No hydrocephalus. No extra-axial collection. No mass effect or midline shift. Brain volume is stable and normal for age. Gray-white differentiation is normal for age. No suspicious intracranial vascular hyperdensity. ORBITS: No acute orbital scalp soft tissue injury. SINUSES: Increased but mild ethmoid sinus inflammation. No layering sinus fluid or hemorrhage. SOFT TISSUES AND SKULL: Hyperostosis of the calvarium, normal variant. No acute soft tissue abnormality. No skull fracture. IMPRESSION: 1. No acute traumatic injury identified 2. Normal for age non contrast CT appearance of the brain. Electronically signed by: Helayne Hurst MD 06/06/2024 08:27 AM EDT RP Workstation: [id number]   DG Hand Complete Left Result Date: 06/06/2024 EXAM: 3 or more VIEW(S) XRAY OF THE left HAND 06/06/2024 07:53:00 AM COMPARISON: Jan 19, 2008 CLINICAL HISTORY: 78 year old female, fall. FINDINGS: BONES AND  JOINTS: Chronic joint space loss and degeneration along the radial aspect of the carpal row has not significantly changed. Similar osteoarthritis in the distal ip joints of the left hand. Chronic ulnar styloid fracture. No acute fracture. SOFT TISSUES: No discrete soft tissue injury. The soft tissues are unremarkable. IMPRESSION: 1. No acute osseous abnormality. 2. Chronic Osteoarthritis. Electronically signed by: Helayne Hurst MD 06/06/2024 08:21 AM EDT RP Workstation: [id number]   DG Humerus Left Result Date: 06/06/2024 EXAM: VIEW(S) XRAY OF THE LEFT HUMERUS 06/06/2024 07:53:00 AM COMPARISON: None available. CLINICAL HISTORY: 78 year old female with a  history of fall from porch onto concrete, on Xarelto , reporting left shoulder and left rib pain. FINDINGS: BONES AND JOINTS: No acute fracture. No focal osseous lesion. No joint dislocation. SOFT TISSUES: No discrete soft tissue injury. IMPRESSION: 1. No acute humerus fracture or dislocation. Electronically signed by: Helayne Hurst MD 06/06/2024 08:17 AM EDT RP Workstation: [id number]   DG Forearm Left Result Date: 06/06/2024 EXAM: VIEW(S) XRAY OF THE LEFT FOREARM 06/06/2024 07:53 AM COMPARISON: Previous left hand series 01/19/2008. CLINICAL HISTORY: 78 year old female with mechanical fall off porch, on Xarelto , reporting right side, left shoulder, and left rib pain. FINDINGS: FINDINGS: BONES AND JOINTS: Chronic ulnar styloid fracture, present on previous exam. Chronic degeneration at the radiocarpal bones, chronic degenerative sclerosis there. No evidence of elbow joint effusion. No joint dislocation. SOFT TISSUES: No discrete soft tissue injury. IMPRESSION: 1. No acute fracture or dislocation. Electronically signed by: Helayne Hurst MD 06/06/2024 08:05 AM EDT RP Workstation: [id number]   DG Chest Portable 1 View Result Date: 06/06/2024 EXAM: 1 VIEW XRAY OF THE CHEST 06/06/2024 07:53:00 AM COMPARISON: 06/20/21 CLINICAL HISTORY: falletc. Reason for exam: fall;  Triage notes: ; Pt BIB [facility] from home due to fall on thinners. Pt is on Xarelto . Bruise on left side of face and leg. Pt took additional dose of Xarelto  this morning as well as last night. Mechanical fall off porch onto concrete this morning around 0655. Pt ; reports right side pain. Left shoulder and left rib pain. ; VS BP 182/92, HR 92, Resp 18 FINDINGS: LUNGS AND PLEURA: No focal pulmonary opacity. No pulmonary edema. No pleural effusion. No pneumothorax. HEART AND MEDIASTINUM: No acute abnormality of the cardiac and mediastinal silhouettes. BONES AND SOFT TISSUES: No acute osseous abnormality. IMPRESSION: 1. No acute process. Electronically signed by: Waddell Calk MD 06/06/2024 08:03 AM EDT RP Workstation: [id number]     Procedures   Medications Ordered in the ED  fentaNYL  (SUBLIMAZE ) injection 25 mcg (25 mcg Intravenous Given 06/06/24 0841)                                    Medical Decision Making Amount and/or Complexity of Data Reviewed Labs: ordered. Radiology: ordered.  Risk Prescription drug management.   This patient presents to the ED for concern of fall, this involves an extensive number of treatment options, and is a complaint that carries with it a high risk of complications and morbidity.  The differential diagnosis includes intracranial hemorrhage, subdural/epidural hematoma, vertebral fracture, spinal cord injury, muscle strain, skull fracture, fracture, splenic injury, liver injury, perforated viscus, contusions.   Lab Tests:  I personally interpreted labs.  The pertinent results include:   Hemoglobin is stable at 14.4, CBC and CMP are grossly unremarkable   Imaging Studies ordered:  I ordered imaging studies including CT imaging of the head, maxillofacial, cervical spine.  X-ray of chest, pelvis, left arm and bilateral knees I independently visualized and interpreted imaging which showed no acute fracture I agree with the radiologist  interpretation   Cardiac Monitoring: / EKG:  The patient was maintained on a cardiac monitor.     Problem List / ED Course / Critical interventions / Medication management  Patient presents as a fall on thinners.  She reports this mechanical fall.  She hit the left side of her forehead.  She did not lose consciousness.  She has a nonfocal neurological exam and is moving her extremities without any difficulty.  She is neurovascularly intact.  Her lungs are clear to auscultation.  Her vital signs are stable and she is overall well-appearing.  I did obtain basic lab work and she does not have any anemia and or signs of blood loss.  Overall appears well.  I did obtain CT imaging and x-rays which were unremarkable.  Patient is feeling better after small dose of pain medicine here.  I am able to get her up and ambulate her she ambulates with steady gait. I ordered medication including atenolol for pain Reevaluation of the patient after these medicines showed that the patient improved I have reviewed the patients home medicines and have made adjustments as needed. Overall reassuring workup.  Feel she stable for discharge with outpatient follow-up.        Final diagnoses:  Fall, initial encounter    ED Discharge Orders     None          Shermon Warren LOISE DEVONNA 06/06/24 1014    Palumbo, April, MD 06/06/24 2310

## 2024-06-06 NOTE — Discharge Instructions (Signed)
 I would recommend Tylenol , ice or heat for pain control. Follow-up with primary care for recheck.  Overall workup here was reassuring.  Return to ER with new or worsening symptoms.

## 2024-06-09 ENCOUNTER — Other Ambulatory Visit: Payer: Self-pay | Admitting: Internal Medicine

## 2024-06-09 DIAGNOSIS — M150 Primary generalized (osteo)arthritis: Secondary | ICD-10-CM

## 2024-06-09 NOTE — Telephone Encounter (Unsigned)
 Copied from CRM [id number]. Topic: Clinical - Medication Refill >> Jun 09, 2024  3:05 PM [place] J wrote: Medication: traMADol  (ULTRAM ) 50 MG tablet  Has the patient contacted their pharmacy? Yes (Agent: If no, request that the patient contact the pharmacy for the refill. If patient does not wish to contact the pharmacy document the reason why and proceed with request.) (Agent: If yes, when and what did the pharmacy advise?) Pharmacy called in for the refill.  This is the patient's preferred pharmacy:  [facility] [place], [place] - [address] [place] [address] Phone: [phone] Fax: [phone]  Is this the correct pharmacy for this prescription? Yes If no, delete pharmacy and type the correct one.   Has the prescription been filled recently? No  Is the patient out of the medication? Yes  Has the patient been seen for an appointment in the last year OR does the patient have an upcoming appointment? Yes  Can we respond through MyChart? Yes  Agent: Please be advised that Rx refills may take up to 3 business days. We ask that you follow-up with your pharmacy.

## 2024-06-10 ENCOUNTER — Encounter: Payer: Self-pay | Admitting: Internal Medicine

## 2024-06-10 MED ORDER — TRAMADOL HCL 50 MG PO TABS: 50.0000 mg | ORAL_TABLET | Freq: Four times a day (QID) | ORAL | 2 refills | Status: DC | PRN

## 2024-06-15 ENCOUNTER — Other Ambulatory Visit (HOSPITAL_COMMUNITY): Payer: Self-pay | Admitting: Cardiology

## 2024-06-16 ENCOUNTER — Other Ambulatory Visit: Payer: Self-pay | Admitting: Internal Medicine

## 2024-06-16 DIAGNOSIS — E7800 Pure hypercholesterolemia, unspecified: Secondary | ICD-10-CM

## 2024-06-29 ENCOUNTER — Other Ambulatory Visit: Payer: Self-pay | Admitting: Internal Medicine

## 2024-06-29 DIAGNOSIS — E876 Hypokalemia: Secondary | ICD-10-CM

## 2024-06-29 DIAGNOSIS — M898X6 Other specified disorders of bone, lower leg: Secondary | ICD-10-CM | POA: Diagnosis not present

## 2024-06-29 DIAGNOSIS — I10 Essential (primary) hypertension: Secondary | ICD-10-CM

## 2024-06-29 DIAGNOSIS — M25561 Pain in right knee: Secondary | ICD-10-CM | POA: Diagnosis not present

## 2024-07-16 DIAGNOSIS — R200 Anesthesia of skin: Secondary | ICD-10-CM | POA: Diagnosis not present

## 2024-07-16 DIAGNOSIS — S52571D Other intraarticular fracture of lower end of right radius, subsequent encounter for closed fracture with routine healing: Secondary | ICD-10-CM | POA: Diagnosis not present

## 2024-07-20 ENCOUNTER — Telehealth: Payer: Self-pay | Admitting: Cardiology

## 2024-07-20 NOTE — Telephone Encounter (Signed)
 Call to patient who states she is fine with visit in March. Last OV 09/24/23.She states she has no needs at this time.

## 2024-07-20 NOTE — Telephone Encounter (Signed)
 Patient is looking to get in before march explain to the patient that unfortunately that is Dr. Shlomo next availability. Please advise

## 2024-07-27 ENCOUNTER — Other Ambulatory Visit (HOSPITAL_COMMUNITY): Payer: Self-pay | Admitting: Cardiology

## 2024-08-11 DIAGNOSIS — H353121 Nonexudative age-related macular degeneration, left eye, early dry stage: Secondary | ICD-10-CM | POA: Diagnosis not present

## 2024-08-11 DIAGNOSIS — H40053 Ocular hypertension, bilateral: Secondary | ICD-10-CM | POA: Diagnosis not present

## 2024-08-11 DIAGNOSIS — Z961 Presence of intraocular lens: Secondary | ICD-10-CM | POA: Diagnosis not present

## 2024-08-11 DIAGNOSIS — H52203 Unspecified astigmatism, bilateral: Secondary | ICD-10-CM | POA: Diagnosis not present

## 2024-08-12 ENCOUNTER — Other Ambulatory Visit (HOSPITAL_COMMUNITY): Payer: Self-pay | Admitting: Cardiology

## 2024-09-04 ENCOUNTER — Other Ambulatory Visit: Payer: Self-pay | Admitting: Internal Medicine

## 2024-09-04 DIAGNOSIS — M150 Primary generalized (osteo)arthritis: Secondary | ICD-10-CM

## 2024-09-07 ENCOUNTER — Ambulatory Visit: Payer: Self-pay | Admitting: Internal Medicine

## 2024-09-07 ENCOUNTER — Ambulatory Visit: Admitting: Internal Medicine

## 2024-09-07 ENCOUNTER — Encounter: Payer: Self-pay | Admitting: Internal Medicine

## 2024-09-07 VITALS — BP 144/86 | HR 58 | Temp 98.4°F | Resp 16 | Ht 63.0 in | Wt 162.4 lb

## 2024-09-07 DIAGNOSIS — I10 Essential (primary) hypertension: Secondary | ICD-10-CM | POA: Diagnosis not present

## 2024-09-07 DIAGNOSIS — U099 Post covid-19 condition, unspecified: Secondary | ICD-10-CM | POA: Diagnosis not present

## 2024-09-07 DIAGNOSIS — R001 Bradycardia, unspecified: Secondary | ICD-10-CM

## 2024-09-07 DIAGNOSIS — E7800 Pure hypercholesterolemia, unspecified: Secondary | ICD-10-CM

## 2024-09-07 DIAGNOSIS — Z23 Encounter for immunization: Secondary | ICD-10-CM | POA: Diagnosis not present

## 2024-09-07 DIAGNOSIS — R053 Chronic cough: Secondary | ICD-10-CM | POA: Diagnosis not present

## 2024-09-07 LAB — BASIC METABOLIC PANEL WITH GFR
BUN: 13 mg/dL (ref 6–23)
CO2: 32 meq/L (ref 19–32)
Calcium: 9.6 mg/dL (ref 8.4–10.5)
Chloride: 102 meq/L (ref 96–112)
Creatinine, Ser: 0.79 mg/dL (ref 0.40–1.20)
GFR: 70.92 mL/min
Glucose, Bld: 93 mg/dL (ref 70–99)
Potassium: 3.5 meq/L (ref 3.5–5.1)
Sodium: 140 meq/L (ref 135–145)

## 2024-09-07 LAB — LIPID PANEL
Cholesterol: 143 mg/dL (ref 28–200)
HDL: 43 mg/dL
LDL Cholesterol: 71 mg/dL (ref 10–99)
NonHDL: 100.06
Total CHOL/HDL Ratio: 3
Triglycerides: 145 mg/dL (ref 10.0–149.0)
VLDL: 29 mg/dL (ref 0.0–40.0)

## 2024-09-07 LAB — TSH: TSH: 2.95 u[IU]/mL (ref 0.35–5.50)

## 2024-09-07 MED ORDER — TELMISARTAN-HCTZ 80-12.5 MG PO TABS: 1.0000 | ORAL_TABLET | Freq: Every day | ORAL | 1 refills | Status: AC

## 2024-09-07 MED ORDER — HYDROCOD POLI-CHLORPHE POLI ER 10-8 MG/5ML PO SUER: 5.0000 mL | Freq: Two times a day (BID) | ORAL | 0 refills | Status: AC | PRN

## 2024-09-07 NOTE — Progress Notes (Signed)
 "  Subjective:  Patient ID: Colleen White, female    DOB: [birth year]  Age: 79 y.o. MRN: [id number]  CC: Follow-up (Hypertension )   HPI Colleen White presents for f/up ---  Discussed the use of AI scribe software for clinical note transcription with the patient, who gave verbal consent to proceed.  History of Present Illness [name] is a 79 year old female with chronic systolic congestive heart failure and atrial fibrillation who presents for follow-up and clarification of her medical history.  She has chronic systolic congestive heart failure and atrial fibrillation. She sees her cardiologist every six months. Her blood pressure was 118/68 in September, which she describes as 'kind of low.' The patient denies dizziness, lightheadedness, chest pain, shortness of breath, or fluid retention. She reports not that much swelling in her legs or feet.  She has carpal tunnel syndrome in both hands, with the right hand being more symptomatic than the left, although a report indicated the left was worse. She experiences numbness. She is not considering surgery at this time.  She recounts an incident where she fell on her concrete porch while retrieving a newspaper, resulting in a hard fall on her left side, including her face, hip, shoulder, and knee. She clarifies that she took an extra dose of Xarelto  after the fall, not before, and emphasizes that Xarelto  did not cause her fall. She experienced headaches following the fall, prompting a 911 call and subsequent x-rays, after which she was released from the ER.     Outpatient Medications Prior to Visit  Medication Sig Dispense Refill   Ascorbic Acid (VITAMIN C PO) Take 500 mg by mouth daily.     brimonidine-timolol (COMBIGAN) 0.2-0.5 % ophthalmic solution Place 1 drop into both eyes every 12 (twelve) hours.      Cholecalciferol  (VITAMIN D3) 1000 units CAPS Take 1,000 Units by mouth.      Coenzyme Q10 (CO Q 10) 100 MG CAPS Take 1  tablet by mouth daily.      cyanocobalamin  50 MCG tablet Take 50 mcg by mouth daily.     dicyclomine  (BENTYL ) 10 MG capsule Take 10 mg by mouth as needed for spasms.     diltiazem  (CARDIZEM  CD) 120 MG 24 hr capsule Take 1 capsule (120 mg total) by mouth daily. 90 capsule 3   docusate sodium (COLACE) 100 MG capsule Take 200 mg by mouth as needed.     esomeprazole  (NEXIUM ) 20 MG packet Take 20 mg by mouth at bedtime. Takes as needed.     fluocinonide (LIDEX) 0.05 % external solution Apply 1 Application topically as directed. Itchy Scalp     gabapentin  (NEURONTIN ) 100 MG capsule Take 3 capsules (300 mg total) by mouth at bedtime. 270 capsule 1   loratadine (CLARITIN) 10 MG tablet Take by mouth daily as needed.      MINOXIDIL FOR MEN EX Apply topically. Takes as needed at night.     mirtazapine  (REMERON ) 15 MG tablet Take 7.5 mg by mouth as needed. Takes 1/4 tablet at bedtime for itching of scalp     Multiple Vitamins-Minerals (PRESERVISION AREDS 2 PO) Take 1 tablet by mouth 2 (two) times daily.      potassium chloride  (MICRO-K ) 10 MEQ CR capsule Take 1 capsule (10 mEq total) by mouth 2 (two) times daily. 180 capsule 0   pravastatin  (PRAVACHOL ) 40 MG tablet TAKE ONE TABLET BY MOUTH AT BEDTIME 90 tablet 1   Probiotic Product (PROBIOTIC DAILY PO) Take  1 tablet by mouth daily.     traMADol  (ULTRAM ) 50 MG tablet Take 1 tablet (50 mg total) by mouth every 6 (six) hours as needed. 65 tablet 2   tretinoin (RETIN-A) 0.05 % cream Once daily     XARELTO  20 MG TABS tablet Take 1 tablet (20 mg total) by mouth daily with supper. 30 tablet 1   chlorpheniramine-HYDROcodone  (TUSSIONEX) 10-8 MG/5ML Take 5 mLs by mouth every 12 (twelve) hours as needed for cough. 115 mL 0   telmisartan -hydrochlorothiazide (MICARDIS  HCT) 80-12.5 MG tablet Take 1 tablet by mouth daily. 30 tablet 3   No facility-administered medications prior to visit.    ROS Review of Systems  Constitutional:  Negative for appetite change, chills,  diaphoresis, fatigue and fever.  HENT: Negative.  Negative for sore throat and trouble swallowing.   Eyes: Negative.   Respiratory:  Positive for cough. Negative for chest tightness, shortness of breath and wheezing.   Cardiovascular:  Negative for chest pain, palpitations and leg swelling.  Gastrointestinal: Negative.  Negative for abdominal pain, constipation, diarrhea, nausea and vomiting.  Endocrine: Negative.   Genitourinary: Negative.  Negative for difficulty urinating, dysuria and hematuria.  Musculoskeletal:  Positive for arthralgias. Negative for joint swelling and myalgias.  Skin: Negative.   Neurological: Negative.  Negative for dizziness, weakness and light-headedness.  Hematological:  Negative for adenopathy. Does not bruise/bleed easily.  Psychiatric/Behavioral: Negative.  Negative for decreased concentration, dysphoric mood and sleep disturbance.     Objective:  BP (!) 144/86 (BP Location: Left Arm, Patient Position: Sitting, Cuff Size: Normal)   Pulse (!) 58   Temp 98.4 F (36.9 C) (Oral)   Resp 16   Ht 5' 3 (1.6 m)   Wt 162 lb 6.4 oz (73.7 kg)   SpO2 97%   BMI 28.77 kg/m   BP Readings from Last 3 Encounters:  09/07/24 (!) 144/86  06/06/24 (!) 162/80  05/26/24 118/68    Wt Readings from Last 3 Encounters:  09/07/24 162 lb 6.4 oz (73.7 kg)  06/06/24 158 lb (71.7 kg)  05/26/24 158 lb 12.8 oz (72 kg)    Physical Exam Vitals reviewed.  Constitutional:      Appearance: Normal appearance.  HENT:     Mouth/Throat:     Mouth: Mucous membranes are moist.  Eyes:     General: No scleral icterus.    Conjunctiva/sclera: Conjunctivae normal.  Cardiovascular:     Rate and Rhythm: Regular rhythm. Bradycardia present.     Heart sounds: Normal heart sounds, S1 normal and S2 normal. No murmur heard.    No friction rub. No gallop.     Comments: EKG- SB, 51 bpm LAE No LVH or Q waves Unchanged  Pulmonary:     Effort: Pulmonary effort is normal.     Breath  sounds: No stridor. No wheezing, rhonchi or rales.  Abdominal:     General: Abdomen is flat.     Palpations: There is no mass.     Tenderness: There is no abdominal tenderness. There is no guarding.     Hernia: No hernia is present.  Musculoskeletal:     Cervical back: Neck supple.     Right lower leg: No edema.     Left lower leg: No edema.  Lymphadenopathy:     Cervical: No cervical adenopathy.  Skin:    General: Skin is warm.     Findings: No lesion or rash.  Neurological:     General: No focal deficit present.  Mental Status: She is alert.  Psychiatric:        Mood and Affect: Mood normal.     Lab Results  Component Value Date   WBC 5.9 06/06/2024   HGB 14.6 06/06/2024   HCT 43.0 06/06/2024   PLT 204 06/06/2024   GLUCOSE 93 09/07/2024   CHOL 143 09/07/2024   TRIG 145.0 09/07/2024   HDL 43.00 09/07/2024   LDLDIRECT 142.5 09/30/2012   LDLCALC 71 09/07/2024   ALT 21 06/06/2024   AST 25 06/06/2024   NA 140 09/07/2024   K 3.5 09/07/2024   CL 102 09/07/2024   CREATININE 0.79 09/07/2024   BUN 13 09/07/2024   CO2 32 09/07/2024   TSH 2.95 09/07/2024   INR 1.7 (H) 03/06/2021   HGBA1C 5.8 09/10/2018    DG Knee 1-2 Views Right Result Date: 06/06/2024 EXAM: 2 VIEW(S) XRAY OF THE RIGHT KNEE 06/06/2024 08:56:00 AM COMPARISON: None available. CLINICAL HISTORY: fall; Triage notes: ; Pt BIB [facility] from home due to fall on thinners. Pt is on Xarelto . Bruise on left side of face and leg. Pt took additional dose of Xarelto  this morning as well as last night. Mechanical fall off porch onto concrete this morning around 0655. Pt ; reports right side pain. Left shoulder and left rib pain. ; VS BP 182/92, HR 92, Resp 18 FINDINGS: BONES AND JOINTS: No acute fracture. No focal osseous lesion. No joint dislocation. Tiny joint effusion. Mild superior patellar degenerative spurring. Minimal medial compartment joint space narrowing and peripheral degenerative spurring consistent with  osteoarthritis. SOFT TISSUES: The soft tissues are unremarkable. IMPRESSION: 1. No acute osseous abnormality. 2. Tiny joint effusion. Electronically signed by: Waddell Calk MD 06/06/2024 09:11 AM EDT RP Workstation: [id number]   DG Shoulder 1 View Left Result Date: 06/06/2024 EXAM: 1 VIEW XRAY OF THE LEFT SHOULDER 06/06/2024 08:56:00 AM COMPARISON: None available. CLINICAL HISTORY: Fall. Reason for exam: fall; Triage notes: ; Pt BIB [facility] from home due to fall on thinners. Pt is on Xarelto . Bruise on left side of face and leg. Pt took additional dose of Xarelto  this morning as well as last night. Mechanical fall off porch onto concrete this morning around 0655. Pt reports right side pain. Left shoulder and left rib pain. VS BP 182/92, HR 92, Resp 18. FINDINGS: BONES AND JOINTS: Glenohumeral joint is normally aligned. No acute fracture or dislocation. Mild degenerative changes of the acromioclavicular joint. SOFT TISSUES: No abnormal calcifications. Visualized lung is unremarkable. IMPRESSION: 1. No acute fracture or dislocation. Electronically signed by: Waddell Calk MD 06/06/2024 09:10 AM EDT RP Workstation: [id number]   DG Pelvis Portable Result Date: 06/06/2024 EXAM: 1 or 2 VIEW(S) XRAY OF THE PELVIS 06/06/2024 08:56:00 AM COMPARISON: CT abdomen and pelvis from 05/01/2018 CLINICAL HISTORY: fall. Reason for exam: fall; Triage notes: ; Pt BIB [facility] from home due to fall on thinners. Pt is on Xarelto . Bruise on left side of face and leg. Pt took additional dose of Xarelto  this morning as well as last night. Mechanical fall off porch onto concrete this morning around 0655. Pt reports right side pain. Left shoulder and left rib pain. VS BP 182/92, HR 92, Resp 18 FINDINGS: BONES AND JOINTS: No acute fracture. No focal osseous lesion. No joint dislocation. Degenerative changes of the visualized lower lumbar spine. Mild degenerative changes of the hips. SOFT TISSUES: The soft tissues are unremarkable.  IMPRESSION: 1. No acute fracture or dislocation. Electronically signed by: Waddell Calk MD 06/06/2024 09:09 AM EDT RP Workstation: [id number]  DG Knee 1-2 Views Left Result Date: 06/06/2024 EXAM: 1 or 2 VIEW(S) XRAY OF THE LEFT KNEE 06/06/2024 08:56:00 AM COMPARISON: None available. CLINICAL HISTORY: Fall 190176. Reason for exam: fall; Triage notes: ; Pt BIB [facility] from home due to fall on thinners. Pt is on Xarelto . Bruise on left side of face and leg. Pt took additional dose of Xarelto  this morning as well as last night. Mechanical fall off porch onto concrete this morning around 0655. Pt reports right side pain. Left shoulder and left rib pain. VS BP 182/92, HR 92, Resp 18 FINDINGS: BONES AND JOINTS: No acute fracture. No focal osseous lesion. No joint dislocation. No significant joint effusion. No significant degenerative changes. SOFT TISSUES: The soft tissues are unremarkable. IMPRESSION: 1. No acute fracture or dislocation. Electronically signed by: Waddell Calk MD 06/06/2024 09:08 AM EDT RP Workstation: [id number]   CT Cervical Spine Wo Contrast Result Date: 06/06/2024 EXAM: CT CERVICAL SPINE WITHOUT CONTRAST 06/06/2024 08:12:27 AM TECHNIQUE: CT of the cervical spine was performed without the administration of intravenous contrast. Multiplanar reformatted images are provided for review. Automated exposure control, iterative reconstruction, and/or weight based adjustment of the mA/kV was utilized to reduce the radiation dose to as low as reasonably achievable. COMPARISON: Cervical spine radiographs 02/26/2023. CLINICAL HISTORY: 79 year old female with polytrauma after a fall from a porch. Patient is on Xarelto . FINDINGS: CERVICAL SPINE: BONES AND ALIGNMENT: Normal cervical lordosis now. Chronic osteopenia. Stable vertebral height. No acute fracture or traumatic malalignment. DEGENERATIVE CHANGES: Very mild cervical spine degeneration. SOFT TISSUES: Negative noncontrast neck soft tissues. Negative  noncontrast thoracic inlet. No prevertebral soft tissue swelling. IMPRESSION: 1. No acute traumatic injury identified in the cervical spine. 2. Very mild cervical spine degeneration. Electronically signed by: Helayne Hurst MD 06/06/2024 08:37 AM EDT RP Workstation: [id number]   CT Maxillofacial Wo Contrast Result Date: 06/06/2024 EXAM: CT OF THE FACE WITHOUT CONTRAST 06/06/2024 08:12:27 AM TECHNIQUE: CT of the face was performed without the administration of intravenous contrast. Multiplanar reformatted images are provided for review. Automated exposure control, iterative reconstruction, and/or weight based adjustment of the mA/kV was utilized to reduce the radiation dose to as low as reasonably achievable. COMPARISON: None available. CLINICAL HISTORY: 79 year old female with polytrauma, blunt. Fall on thinners (Xarelto ). Bruise on left face. Mechanical fall 06/06/2024 06:55 AM. FINDINGS: FACIAL BONES: No acute facial fracture. No mandibular dislocation. No suspicious bone lesion. No orbital wall fracture. ORBITS: Globes are intact. Chronic postoperative changes to the globes. Intraorbital soft tissues remain normal. Mild lateral left periorbital soft tissue swelling and stranding. No inflammatory change. SINUSES AND MASTOIDS: Mild bilateral left ethmoid sinus inflammation. Other paranasal sinuses and mastoids are negative. SOFT TISSUES: No soft tissue gas or other superficial soft tissue injury. IMPRESSION: 1. Mild left periorbital soft tissue injury. No facial fracture. 2. Mild bilateral sinus inflammatory changes. Electronically signed by: Helayne Hurst MD 06/06/2024 08:35 AM EDT RP Workstation: [id number]   CT Head Wo Contrast Result Date: 06/06/2024 EXAM: CT HEAD WITHOUT CONTRAST 06/06/2024 08:12:27 AM TECHNIQUE: CT of the head was performed without the administration of intravenous contrast. Automated exposure control, iterative reconstruction, and/or weight based adjustment of the mA/kV was utilized to  reduce the radiation dose to as low as reasonably achievable. COMPARISON: February 26, 2023. CLINICAL HISTORY: 79 year old female, polytrauma. FINDINGS: BRAIN AND VENTRICLES: No acute hemorrhage. No evidence of acute infarct. No hydrocephalus. No extra-axial collection. No mass effect or midline shift. Brain volume is stable and normal for age. Gray-white differentiation is normal for age.  No suspicious intracranial vascular hyperdensity. ORBITS: No acute orbital scalp soft tissue injury. SINUSES: Increased but mild ethmoid sinus inflammation. No layering sinus fluid or hemorrhage. SOFT TISSUES AND SKULL: Hyperostosis of the calvarium, normal variant. No acute soft tissue abnormality. No skull fracture. IMPRESSION: 1. No acute traumatic injury identified 2. Normal for age non contrast CT appearance of the brain. Electronically signed by: Helayne Hurst MD 06/06/2024 08:27 AM EDT RP Workstation: [id number]   DG Hand Complete Left Result Date: 06/06/2024 EXAM: 3 or more VIEW(S) XRAY OF THE left HAND 06/06/2024 07:53:00 AM COMPARISON: Jan 19, 2008 CLINICAL HISTORY: 79 year old female, fall. FINDINGS: BONES AND JOINTS: Chronic joint space loss and degeneration along the radial aspect of the carpal row has not significantly changed. Similar osteoarthritis in the distal ip joints of the left hand. Chronic ulnar styloid fracture. No acute fracture. SOFT TISSUES: No discrete soft tissue injury. The soft tissues are unremarkable. IMPRESSION: 1. No acute osseous abnormality. 2. Chronic Osteoarthritis. Electronically signed by: Helayne Hurst MD 06/06/2024 08:21 AM EDT RP Workstation: [id number]   DG Humerus Left Result Date: 06/06/2024 EXAM: VIEW(S) XRAY OF THE LEFT HUMERUS 06/06/2024 07:53:00 AM COMPARISON: None available. CLINICAL HISTORY: 79 year old female with a history of fall from porch onto concrete, on Xarelto , reporting left shoulder and left rib pain. FINDINGS: BONES AND JOINTS: No acute fracture. No focal osseous  lesion. No joint dislocation. SOFT TISSUES: No discrete soft tissue injury. IMPRESSION: 1. No acute humerus fracture or dislocation. Electronically signed by: Helayne Hurst MD 06/06/2024 08:17 AM EDT RP Workstation: [id number]   DG Forearm Left Result Date: 06/06/2024 EXAM: VIEW(S) XRAY OF THE LEFT FOREARM 06/06/2024 07:53 AM COMPARISON: Previous left hand series 01/19/2008. CLINICAL HISTORY: 79 year old female with mechanical fall off porch, on Xarelto , reporting right side, left shoulder, and left rib pain. FINDINGS: FINDINGS: BONES AND JOINTS: Chronic ulnar styloid fracture, present on previous exam. Chronic degeneration at the radiocarpal bones, chronic degenerative sclerosis there. No evidence of elbow joint effusion. No joint dislocation. SOFT TISSUES: No discrete soft tissue injury. IMPRESSION: 1. No acute fracture or dislocation. Electronically signed by: Helayne Hurst MD 06/06/2024 08:05 AM EDT RP Workstation: [id number]   DG Chest Portable 1 View Result Date: 06/06/2024 EXAM: 1 VIEW XRAY OF THE CHEST 06/06/2024 07:53:00 AM COMPARISON: 06/20/21 CLINICAL HISTORY: falletc. Reason for exam: fall; Triage notes: ; Pt BIB [facility] from home due to fall on thinners. Pt is on Xarelto . Bruise on left side of face and leg. Pt took additional dose of Xarelto  this morning as well as last night. Mechanical fall off porch onto concrete this morning around 0655. Pt ; reports right side pain. Left shoulder and left rib pain. ; VS BP 182/92, HR 92, Resp 18 FINDINGS: LUNGS AND PLEURA: No focal pulmonary opacity. No pulmonary edema. No pleural effusion. No pneumothorax. HEART AND MEDIASTINUM: No acute abnormality of the cardiac and mediastinal silhouettes. BONES AND SOFT TISSUES: No acute osseous abnormality. IMPRESSION: 1. No acute process. Electronically signed by: Waddell Calk MD 06/06/2024 08:03 AM EDT RP Workstation: [id number]    Assessment & Plan:  Bradycardia -     EKG 12-Lead -     TSH;  Future  HYPERCHOLESTEROLEMIA- LDL goal achieved. Doing well on the statin  -     Lipid panel; Future -     TSH; Future  Essential hypertension- BP is well controlled. -     Basic metabolic panel with GFR; Future -     TSH; Future -  Telmisartan -HCTZ; Take 1 tablet by mouth daily.  Dispense: 90 tablet; Refill: 1  Post-COVID chronic cough -     Hydrocod Poli-Chlorphe Poli ER; Take 5 mLs by mouth every 12 (twelve) hours as needed for cough.  Dispense: 115 mL; Refill: 0  Need for immunization against influenza -     Flu vaccine HIGH DOSE PF(Fluzone Trivalent)     Follow-up: Return in about 6 months (around 03/07/2025).  Debby Molt, MD "

## 2024-09-07 NOTE — Patient Instructions (Signed)
 Bradycardia, Adult Bradycardia is a slower-than-normal heartbeat. A normal resting heart rate for an adult ranges from 60 to 100 beats per minute. With bradycardia, the resting heart rate is less than 60 beats per minute. Bradycardia can prevent enough oxygen  from reaching certain areas of your body when you are active. It can be serious if it keeps enough oxygen  from reaching your brain and other parts of your body. Bradycardia is not a problem for everyone. For some healthy adults, a slow resting heart rate is normal. What are the causes? This condition may be caused by: A problem with the heart, including: A problem with the heart's electrical system, such as a heart block. With a heart block, electrical signals between the chambers of the heart are partially or completely blocked, so they are not able to work as they should. A problem with the heart's natural pacemaker (sinus node). Heart disease. A heart attack. Heart damage. Lyme disease. A heart infection. A heart condition that is present at birth (congenital heart defect). Certain medicines that treat heart conditions. Certain conditions, such as hypothyroidism and obstructive sleep apnea. Problems with the balance of chemicals and other substances, like potassium, in the blood. Trauma. Radiation therapy. What increases the risk? You are more likely to develop this condition if you: Are age 30 or older. Have high blood pressure (hypertension), high cholesterol (hyperlipidemia), or diabetes. Drink heavily, use tobacco or nicotine products, or use drugs. What are the signs or symptoms? Symptoms of this condition include: Light-headedness. Feeling faint or fainting. Fatigue and weakness. Trouble with activity or exercise. Shortness of breath. Chest pain (angina). Drowsiness. Confusion. Dizziness. How is this diagnosed? This condition may be diagnosed based on: Your symptoms. Your medical history. A physical exam. During  the exam, your health care provider will listen to your heartbeat and check your pulse. To confirm the diagnosis, your health care provider may order tests, such as: Blood tests. An electrocardiogram (ECG). This test records the heart's electrical activity. The test can show how fast your heart is beating and whether the heartbeat is steady. A test in which you wear a portable device (event recorder or Holter monitor) to record your heart's electrical activity while you go about your day. An exercise test. How is this treated? Treatment for this condition depends on the cause of the condition and how severe your symptoms are. Treatment may involve: Treatment of the underlying condition. Changing your medicines or how much medicine you take. Having a small, battery-operated device called a pacemaker implanted under the skin. When bradycardia occurs, this device can be used to increase your heart rate and help your heart beat in a regular rhythm. Follow these instructions at home: Lifestyle Manage any health conditions that contribute to bradycardia as told by your health care provider. Follow a heart-healthy diet. A nutrition specialist (dietitian) can help educate you about healthy food options and changes. Follow an exercise program that is approved by your health care provider. Maintain a healthy weight. Try to reduce or manage your stress, such as with yoga or meditation. If you need help reducing stress, ask your health care provider. Do not use any products that contain nicotine or tobacco. These products include cigarettes, chewing tobacco, and vaping devices, such as e-cigarettes. If you need help quitting, ask your health care provider. Do not use illegal drugs. Alcohol  use If you drink alcohol : Limit how much you have to: 0-1 drink a day for women who are not pregnant. 0-2 drinks a day  for men. Know how much alcohol  is in a drink. In the [place] one drink equals one 12 oz bottle of  beer (355 mL), one 5 oz glass of wine (148 mL), or one 1 oz glass of hard liquor (44 mL). General instructions Take over-the-counter and prescription medicines only as told by your health care provider. Keep all follow-up visits. This is important. How is this prevented? In some cases, bradycardia may be prevented by: Treating underlying medical problems. Stopping behaviors or medicines that can trigger the condition. Contact a health care provider if: You feel light-headed or dizzy. You almost faint. You feel weak or are easily fatigued during physical activity. You experience confusion or have memory problems. Get help right away if: You faint. You have chest pains or an irregular heartbeat (palpitations). You have trouble breathing. These symptoms may represent a serious problem that is an emergency. Do not wait to see if the symptoms will go away. Get medical help right away. Call your local emergency services (911 in the [place] Do not drive yourself to the hospital. Summary Bradycardia is a slower-than-normal heartbeat. With bradycardia, the resting heart rate is less than 60 beats per minute. Treatment for this condition depends on the cause. Manage any health conditions that contribute to bradycardia as told by your health care provider. Do not use any products that contain nicotine or tobacco. These products include cigarettes, chewing tobacco, and vaping devices, such as e-cigarettes. Keep all follow-up visits. This is important. This information is not intended to replace advice given to you by your health care provider. Make sure you discuss any questions you have with your health care provider. Document Revised: 12/04/2020 Document Reviewed: 12/04/2020 Elsevier Patient Education  2024 [employer].

## 2024-09-14 ENCOUNTER — Telehealth: Payer: Self-pay

## 2024-09-14 NOTE — Progress Notes (Unsigned)
 "   Date:  09/15/2024   ID:  Anjeanette, Petzold 08-02-1946, MRN [id number]   PCP:  Joshua Debby CROME, MD  Cardiologist:  Ezra Shuck, MD Sleep Medicine:  Wilbert Bihari, MD Electrophysiologist:  None   Chief Complaint:  OSA  History of Present Illness:    SHALITA NOTTE is a 79 y.o. female with a hx of mild OSA with an AHI of 9.1/hr and is on CPAP at 7cm H2O.   She is doing well with her PAP device.  She tolerates the nasal pillow mask and feels the pressure is adequate.  She feels rested in the am if she has slept well the night before. She has some sleepiness late in the afternoon if she sits down to watch TV. She denies any significant nasal dryness or nasal congestion.  She does have some mouth dryness. She does not think that she snores. An Epworth Sleepiness Scale score was calculated the office today and this endorsed at 8 arguing against residual daytime sleepiness. Patient denies any episodes of restless legs, No hypnogognic hallucinations or cataplectic events.  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   Prior CV studies:   The following studies were reviewed today:  PAP compliance download  Past Medical History:  Diagnosis Date   Allergic rhinitis, cause unspecified    Alopecia    Anxiety    Colon polyps    Diverticulosis of colon (without mention of hemorrhage)    Fibrocystic breast disease    GERD (gastroesophageal reflux disease)    Glaucoma    Headache(784.0)    Hemorrhoids    History of echocardiogram    Echo 3/17:  Vigorous LVF, EF 65-70%, normal wall motion, grade 1 diastolic, mildly elevated LVOT velocity of 2.2 m/s-likely related to vigorous LVF   History of stress test    Myoview normal in 2/05. //  Stress echo (11/15) with no evidence for ischemia or infarction.   HLD (hyperlipidemia)    HTN (hypertension)    Hypertension    Mitral valve disorders(424.0)    Non Hodgkin's lymphoma (HCC)    Treated at [facility] with surgery and XRT.   Did not have chemotherapy.    OSA (obstructive sleep apnea) 03/29/2016   Osteopenia    PAF (paroxysmal atrial fibrillation) (HCC)    a. CHADS2-VASc=3 //  b. Xarelto  started in 2017   Pulmonary nodule    Presumed benign after observation.    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 Ames, [facility]   s/p ORIF prox phalanx  02/2007   right little finger----Dr Murrell     Current Meds  Medication Sig   Ascorbic Acid (VITAMIN C PO) Take 500 mg by mouth daily.   brimonidine-timolol (COMBIGAN) 0.2-0.5 % ophthalmic solution Place 1 drop into both eyes every  12 (twelve) hours.    chlorpheniramine-HYDROcodone  (TUSSIONEX) 10-8 MG/5ML Take 5 mLs by mouth every 12 (twelve) hours as needed for cough.   Cholecalciferol  (VITAMIN D3) 1000 units CAPS Take 1,000 Units by mouth.    Coenzyme Q10 (CO Q 10) 100 MG CAPS Take 1 tablet by mouth daily.    cyanocobalamin  50 MCG tablet Take 50 mcg by mouth daily.   dicyclomine  (BENTYL ) 10 MG capsule Take 10 mg by mouth as needed for spasms.   diltiazem  (CARDIZEM  CD) 120 MG 24 hr capsule Take 1 capsule (120 mg total) by mouth daily.   docusate sodium (COLACE) 100 MG capsule Take 200 mg by mouth as needed.   esomeprazole  (NEXIUM ) 20 MG packet Take 20 mg by mouth at bedtime. Takes as needed.   fluocinonide (LIDEX) 0.05 % external solution Apply 1 Application topically as directed. Itchy Scalp    gabapentin  (NEURONTIN ) 100 MG capsule Take 3 capsules (300 mg total) by mouth at bedtime.   loratadine (CLARITIN) 10 MG tablet Take by mouth daily as needed.    MINOXIDIL FOR MEN EX Apply topically. Takes as needed at night.   mirtazapine  (REMERON ) 15 MG tablet Take 7.5 mg by mouth as needed. Takes 1/4 tablet at bedtime for itching of scalp   Multiple Vitamins-Minerals (PRESERVISION AREDS 2 PO) Take 1 tablet by mouth 2 (two) times daily.    potassium chloride  (MICRO-K ) 10 MEQ CR capsule Take 1 capsule (10 mEq total) by mouth 2 (two) times daily.   pravastatin  (PRAVACHOL ) 40 MG tablet TAKE ONE TABLET BY MOUTH AT BEDTIME   Probiotic Product (PROBIOTIC DAILY PO) Take 1 tablet by mouth daily.   telmisartan -hydrochlorothiazide (MICARDIS  HCT) 80-12.5 MG tablet Take 1 tablet by mouth daily.   traMADol  (ULTRAM ) 50 MG tablet Take 1 tablet (50 mg total) by mouth every 6 (six) hours as needed.   tretinoin (RETIN-A) 0.05 % cream Once daily   XARELTO  20 MG TABS tablet Take 1 tablet (20 mg total) by mouth daily with supper.     Allergies:   Ibuprofen   Social History   Tobacco Use   Smoking status: Former    Current packs/day: 0.00    Average packs/day: 1 pack/day for 12.0 years (12.0 ttl pk-yrs)    Types: Cigarettes    Start date: 08/27/1962    Quit date: 08/27/1974    Years since quitting: 50.0    Passive exposure: Past   Smokeless tobacco: Never  Vaping Use   Vaping status: Never Used  Substance Use Topics   Alcohol use: No   Drug use: No     Family Hx: The patient's family history includes Breast cancer in her mother and sister; Colon cancer in her maternal uncle; Diabetes in her father; Esophageal cancer in her father; Heart disease in her father and mother; Hypertension in her mother; Prostate cancer in her father.  ROS:   Please see the history of present illness.     All other systems reviewed and are negative.   Labs/Other Tests and Data Reviewed:    EKG  Interpretation Date/Time:  Tuesday September 15 2024 11:45:18 EST Ventricular Rate:  91 PR Interval:  158 QRS Duration:  78 QT Interval:  396 QTC Calculation: 487 R Axis:   16  Text Interpretation: Sinus rhythm with frequent Premature ventricular complexes in a pattern of bigeminy Nonspecific ST and T wave abnormality When compared with ECG of 07-Apr-2024 08:21, Premature ventricular complexes are now Present Vent. rate has increased BY  32  BPM QT has lengthened Confirmed by Shlomo Corning [phone]) on 09/15/2024 11:48:12 AM    Recent Labs: 02/17/2024: Magnesium  2.0 06/06/2024: ALT 21; Hemoglobin 14.6; Platelets 204 09/07/2024: BUN 13; Creatinine, Ser 0.79; Potassium 3.5; Sodium 140; TSH 2.95   Recent Lipid Panel Lab Results  Component Value Date/Time   CHOL 143 09/07/2024 11:43 AM   TRIG 145.0 09/07/2024 11:43 AM   HDL 43.00 09/07/2024 11:43 AM   CHOLHDL 3 09/07/2024 11:43 AM   LDLCALC 71 09/07/2024 11:43 AM   LDLDIRECT 142.5 09/30/2012 11:10 AM    Wt Readings from Last 3 Encounters:  09/15/24 166 lb (75.3 kg)  09/07/24 162 lb 6.4 oz (73.7 kg)  06/06/24 158 lb (71.7 kg)     Objective:    Vital Signs:  BP 120/68   Pulse 76   Ht 5' 3 (1.6 m)   Wt 166 lb (75.3 kg)   SpO2 96%   BMI 29.41 kg/m   GEN: Well nourished, well developed in no acute distress HEENT: Normal NECK: No JVD; No carotid bruits LYMPHATICS: No lymphadenopathy CARDIAC:RRR with frequent ectopy, no murmurs, rubs, gallops RESPIRATORY:  Clear to auscultation without rales, wheezing or rhonchi  ABDOMEN: Soft, non-tender, non-distended MUSCULOSKELETAL:  No edema; No deformity  SKIN: Warm and dry NEUROLOGIC:  Alert and oriented x 3 PSYCHIATRIC:  Normal affect  ASSESSMENT & PLAN:    OSA - The patient is tolerating PAP therapy well without any problems. The PAP download performed by his DME was personally reviewed and interpreted by me today and showed an AHI of 2.8/hr on 7 cm H2O with 93% compliance in using more  than 4 hours nightly.  The patient has been using and benefiting from PAP use and will continue to benefit from therapy.    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 Rolan in 2 weeks   Medication Adjustments/Labs and Tests Ordered: Current medicines are reviewed at length with the patient today.  Concerns regarding medicines are outlined above.  Tests Ordered: Orders Placed This Encounter  Procedures   EKG 12-Lead    Medication Changes: No orders of the defined types were placed in this encounter.    Disposition:  Follow up in 1 year(s)  Signed, Corning Shlomo, MD  09/15/2024 11:49 AM    [facility] "

## 2024-09-14 NOTE — Telephone Encounter (Signed)
 SABRA

## 2024-09-15 ENCOUNTER — Ambulatory Visit

## 2024-09-15 ENCOUNTER — Ambulatory Visit: Attending: Cardiology | Admitting: Cardiology

## 2024-09-15 ENCOUNTER — Encounter: Payer: Self-pay | Admitting: Cardiology

## 2024-09-15 VITALS — BP 120/68 | HR 76 | Ht 63.0 in | Wt 166.0 lb

## 2024-09-15 DIAGNOSIS — I10 Essential (primary) hypertension: Secondary | ICD-10-CM | POA: Diagnosis not present

## 2024-09-15 DIAGNOSIS — I493 Ventricular premature depolarization: Secondary | ICD-10-CM

## 2024-09-15 DIAGNOSIS — G4733 Obstructive sleep apnea (adult) (pediatric): Secondary | ICD-10-CM | POA: Insufficient documentation

## 2024-09-15 NOTE — Addendum Note (Signed)
 Addended by: CECELIA MADURA R on: 09/15/2024 11:40 AM   Modules accepted: Orders

## 2024-09-15 NOTE — Progress Notes (Unsigned)
 Enrolled patient for a 14 day Zio XT  monitor to be mailed to patients home

## 2024-09-15 NOTE — Addendum Note (Signed)
 Addended by: JANIT GENI CROME on: 09/15/2024 11:53 AM   Modules accepted: Orders

## 2024-09-15 NOTE — Patient Instructions (Addendum)
 Medication Instructions:  Your physician recommends that you continue on your current medications as directed. Please refer to the Current Medication list given to you today.  *If you need a refill on your cardiac medications before your next appointment, please call your pharmacy*  Lab Work: None.  If you have labs (blood work) drawn today and your tests are completely normal, you will receive your results only by: MyChart Message (if you have MyChart) OR A paper copy in the mail If you have any lab test that is abnormal or we need to change your treatment, we will call you to review the results.  Testing/Procedures: GEOFFRY HEWS- Long Term Monitor Instructions  Your physician has requested you wear a ZIO patch monitor for 14  days.  This is a single patch monitor. [employer] supplies one patch monitor per enrollment. Additional stickers are not available. Please do not apply patch if you will be having a Nuclear Stress Test,  Echocardiogram, Cardiac CT, MRI, or Chest Xray during the period you would be wearing the  monitor. The patch cannot be worn during these tests. You cannot remove and re-apply the  ZIO XT patch monitor.  Your ZIO patch monitor will be mailed 3 day USPS to your address on file. It may take 3-5 days  to receive your monitor after you have been enrolled.  Once you have received your monitor, please review the enclosed instructions. Your monitor  has already been registered assigning a specific monitor serial # to you.  Billing and Patient Assistance Program Information  We have supplied [employer] with any of your insurance information on file for billing purposes. [employer] offers a sliding scale Patient Assistance Program for patients that do not have  insurance, or whose insurance does not completely cover the cost of the ZIO monitor.  You must apply for the Patient Assistance Program to qualify for this discounted rate.  To apply, please call [employer] at [phone],  select option 4, select option 2, ask to apply for  Patient Assistance Program. Meredeth will ask your household income, and how many people  are in your household. They will quote your out-of-pocket cost based on that information.  [employer] will also be able to set up a 72-month, interest-free payment plan if needed.  Applying the monitor   Shave hair from upper left chest.  Hold abrader disc by orange tab. Rub abrader in 40 strokes over the upper left chest as  indicated in your monitor instructions.  Clean area with 4 enclosed alcohol pads. Let dry.  Apply patch as indicated in monitor instructions. Patch will be placed under collarbone on left  side of chest with arrow pointing upward.  Rub patch adhesive wings for 2 minutes. Remove white label marked 1. Remove the white  label marked 2. Rub patch adhesive wings for 2 additional minutes.  While looking in a mirror, press and release button in center of patch. A small green light will  flash 3-4 times. This will be your only indicator that the monitor has been turned on.  Do not shower for the first 24 hours. You may shower after the first 24 hours.  Press the button if you feel a symptom. You will hear a small click. Record Date, Time and  Symptom in the Patient Logbook.  When you are ready to remove the patch, follow instructions on the last 2 pages of Patient  Logbook. Stick patch monitor onto the last page of Patient Logbook.  Place Patient Logbook in  the blue and white box. Use locking tab on box and tape box closed  securely. The blue and white box has prepaid postage on it. Please place it in the mailbox as  soon as possible. Your physician should have your test results approximately 7 days after the  monitor has been mailed back to [facility].  Call [facility] Customer Care at [phone] if you have questions regarding  your ZIO XT patch monitor. Call them immediately if you see an orange light blinking on your   monitor.  If your monitor falls off in less than 4 days, contact our Monitor department at [phone].  If your monitor becomes loose or falls off after 4 days call [employer] at [phone] for  suggestions on securing your monitor   Follow-Up: At [facility], you and your health needs are our priority.  As part of our continuing mission to provide you with exceptional heart care, our providers are all part of one team.  This team includes your primary Cardiologist (physician) and Advanced Practice Providers or APPs (Physician Assistants and Nurse Practitioners) who all work together to provide you with the care you need, when you need it.  Your next appointment:   1 year(s)  Provider:   Dr. Wilbert Bihari, MD

## 2024-10-07 ENCOUNTER — Ambulatory Visit (HOSPITAL_COMMUNITY): Admitting: Cardiology

## 2024-10-29 ENCOUNTER — Ambulatory Visit: Admitting: Cardiology

## 2024-12-09 ENCOUNTER — Ambulatory Visit

## 2025-03-09 ENCOUNTER — Ambulatory Visit: Admitting: Internal Medicine
